# Patient Record
Sex: Male | Born: 1975 | Race: Black or African American | Hispanic: No | Marital: Married | Smoking: Current every day smoker
Health system: Southern US, Community
[De-identification: ages and names within clinical notes are randomized; demographics above are authoritative.]

## PROBLEM LIST (undated history)

## (undated) DIAGNOSIS — I509 Heart failure, unspecified: Secondary | ICD-10-CM

## (undated) DIAGNOSIS — F319 Bipolar disorder, unspecified: Secondary | ICD-10-CM

## (undated) DIAGNOSIS — I10 Essential (primary) hypertension: Secondary | ICD-10-CM

## (undated) HISTORY — PX: FOOT SURGERY: SHX648

## (undated) HISTORY — PX: BICEPS TENDON REPAIR: SHX566

## (undated) HISTORY — DX: Bipolar disorder, unspecified: F31.9

## (undated) HISTORY — PX: ROTATOR CUFF REPAIR: SHX139

---

## 1999-06-14 ENCOUNTER — Encounter: Payer: Self-pay | Admitting: Emergency Medicine

## 1999-06-14 ENCOUNTER — Emergency Department (HOSPITAL_COMMUNITY): Admission: EM | Admit: 1999-06-14 | Discharge: 1999-06-14 | Payer: Self-pay | Admitting: Emergency Medicine

## 1999-06-20 ENCOUNTER — Emergency Department (HOSPITAL_COMMUNITY): Admission: EM | Admit: 1999-06-20 | Discharge: 1999-06-20 | Payer: Self-pay | Admitting: Emergency Medicine

## 2018-10-09 ENCOUNTER — Inpatient Hospital Stay
Admission: EM | Admit: 2018-10-09 | Discharge: 2018-10-11 | DRG: 280 | Disposition: A | Discharge status: Home / Self Care | Payer: Self-pay | Attending: Internal Medicine | Admitting: Internal Medicine

## 2018-10-09 ENCOUNTER — Other Ambulatory Visit: Payer: Self-pay

## 2018-10-09 ENCOUNTER — Emergency Department: Payer: Self-pay

## 2018-10-09 ENCOUNTER — Inpatient Hospital Stay: Payer: Self-pay

## 2018-10-09 DIAGNOSIS — T50916A Underdosing of multiple unspecified drugs, medicaments and biological substances, initial encounter: Secondary | ICD-10-CM | POA: Diagnosis present

## 2018-10-09 DIAGNOSIS — Z716 Tobacco abuse counseling: Secondary | ICD-10-CM

## 2018-10-09 DIAGNOSIS — F17200 Nicotine dependence, unspecified, uncomplicated: Secondary | ICD-10-CM | POA: Diagnosis present

## 2018-10-09 DIAGNOSIS — R059 Cough, unspecified: Secondary | ICD-10-CM

## 2018-10-09 DIAGNOSIS — I110 Hypertensive heart disease with heart failure: Secondary | ICD-10-CM | POA: Diagnosis present

## 2018-10-09 DIAGNOSIS — Z79899 Other long term (current) drug therapy: Secondary | ICD-10-CM

## 2018-10-09 DIAGNOSIS — I5023 Acute on chronic systolic (congestive) heart failure: Secondary | ICD-10-CM | POA: Diagnosis present

## 2018-10-09 DIAGNOSIS — G4733 Obstructive sleep apnea (adult) (pediatric): Secondary | ICD-10-CM | POA: Diagnosis present

## 2018-10-09 DIAGNOSIS — Z818 Family history of other mental and behavioral disorders: Secondary | ICD-10-CM

## 2018-10-09 DIAGNOSIS — I214 Non-ST elevation (NSTEMI) myocardial infarction: Principal | ICD-10-CM | POA: Diagnosis present

## 2018-10-09 DIAGNOSIS — R079 Chest pain, unspecified: Secondary | ICD-10-CM | POA: Diagnosis present

## 2018-10-09 DIAGNOSIS — Z888 Allergy status to other drugs, medicaments and biological substances status: Secondary | ICD-10-CM

## 2018-10-09 DIAGNOSIS — Z880 Allergy status to penicillin: Secondary | ICD-10-CM

## 2018-10-09 DIAGNOSIS — I472 Ventricular tachycardia: Secondary | ICD-10-CM | POA: Diagnosis present

## 2018-10-09 DIAGNOSIS — R011 Cardiac murmur, unspecified: Secondary | ICD-10-CM | POA: Diagnosis present

## 2018-10-09 DIAGNOSIS — R05 Cough: Secondary | ICD-10-CM

## 2018-10-09 DIAGNOSIS — Z7982 Long term (current) use of aspirin: Secondary | ICD-10-CM

## 2018-10-09 DIAGNOSIS — Z881 Allergy status to other antibiotic agents status: Secondary | ICD-10-CM

## 2018-10-09 DIAGNOSIS — I428 Other cardiomyopathies: Secondary | ICD-10-CM | POA: Diagnosis present

## 2018-10-09 DIAGNOSIS — Z823 Family history of stroke: Secondary | ICD-10-CM

## 2018-10-09 DIAGNOSIS — Z6841 Body Mass Index (BMI) 40.0 and over, adult: Secondary | ICD-10-CM

## 2018-10-09 DIAGNOSIS — Z23 Encounter for immunization: Secondary | ICD-10-CM

## 2018-10-09 DIAGNOSIS — Z91120 Patient's intentional underdosing of medication regimen due to financial hardship: Secondary | ICD-10-CM

## 2018-10-09 HISTORY — DX: Essential (primary) hypertension: I10

## 2018-10-09 HISTORY — DX: Heart failure, unspecified: I50.9

## 2018-10-09 LAB — TROPONIN I
Troponin I: 0.04 ng/mL (ref <0.03)
Troponin I: 0.05 ng/mL (ref <0.03)
Troponin I: 0.21 ng/mL (ref <0.03)
Troponin I: 0.41 ng/mL (ref <0.03)
Troponin I: 0.41 ng/mL (ref <0.03)

## 2018-10-09 LAB — HEMOGLOBIN A1C
Hgb A1c MFr Bld: 5.3 % (ref 4.8–5.6)
Mean Plasma Glucose: 105.41 mg/dL

## 2018-10-09 LAB — BASIC METABOLIC PANEL
Anion gap: 4 — ABNORMAL LOW (ref 5–15)
BUN: 9 mg/dL (ref 6–20)
CHLORIDE: 112 mmol/L — AB (ref 98–111)
CO2: 24 mmol/L (ref 22–32)
Calcium: 8.5 mg/dL — ABNORMAL LOW (ref 8.9–10.3)
Creatinine, Ser: 1.02 mg/dL (ref 0.61–1.24)
GFR calc Af Amer: >60 mL/min (ref >60)
GFR calc non Af Amer: >60 mL/min (ref >60)
Glucose, Bld: 112 mg/dL — ABNORMAL HIGH (ref 70–99)
Potassium: 3.7 mmol/L (ref 3.5–5.1)
SODIUM: 140 mmol/L (ref 135–145)

## 2018-10-09 LAB — CBC
HCT: 38.2 % — ABNORMAL LOW (ref 39.0–52.0)
Hemoglobin: 11.2 g/dL — ABNORMAL LOW (ref 13.0–17.0)
MCH: 22.2 pg — ABNORMAL LOW (ref 26.0–34.0)
MCHC: 29.3 g/dL — ABNORMAL LOW (ref 30.0–36.0)
MCV: 75.6 fL — ABNORMAL LOW (ref 80.0–100.0)
Platelets: 306 10*3/uL (ref 150–400)
RBC: 5.05 MIL/uL (ref 4.22–5.81)
RDW: 17.6 % — ABNORMAL HIGH (ref 11.5–15.5)
WBC: 7.1 10*3/uL (ref 4.0–10.5)
nRBC: 0 % (ref 0.0–0.2)

## 2018-10-09 LAB — PROTIME-INR
INR: 1.11
Prothrombin Time: 14.2 seconds (ref 11.4–15.2)

## 2018-10-09 LAB — BRAIN NATRIURETIC PEPTIDE: B Natriuretic Peptide: 425 pg/mL — ABNORMAL HIGH (ref 0.0–100.0)

## 2018-10-09 LAB — TSH: TSH: 1.976 u[IU]/mL (ref 0.350–4.500)

## 2018-10-09 LAB — APTT: aPTT: 30 seconds (ref 24–36)

## 2018-10-09 LAB — MAGNESIUM: Magnesium: 2.2 mg/dL (ref 1.7–2.4)

## 2018-10-09 LAB — HEPARIN LEVEL (UNFRACTIONATED): Heparin Unfractionated: <0.1 IU/mL — ABNORMAL LOW (ref 0.30–0.70)

## 2018-10-09 MED ORDER — POTASSIUM CHLORIDE CRYS ER 20 MEQ PO TBCR
40.0000 meq | EXTENDED_RELEASE_TABLET | ORAL | Status: AC
  Administered 2018-10-09: 40 meq via ORAL
  Filled 2018-10-09: qty 2

## 2018-10-09 MED ORDER — FUROSEMIDE 40 MG PO TABS: 40.0000 mg | ORAL_TABLET | Freq: Two times a day (BID) | ORAL | Status: DC

## 2018-10-09 MED ORDER — MORPHINE SULFATE (PF) 2 MG/ML IV SOLN
2.0000 mg | Freq: Once | INTRAVENOUS | Status: AC
  Administered 2018-10-09: 2 mg via INTRAVENOUS
  Filled 2018-10-09: qty 1

## 2018-10-09 MED ORDER — FUROSEMIDE 10 MG/ML IJ SOLN
20.0000 mg | Freq: Two times a day (BID) | INTRAMUSCULAR | Status: DC
  Administered 2018-10-09 – 2018-10-10 (×2): 20 mg via INTRAVENOUS
  Filled 2018-10-09 (×3): qty 2

## 2018-10-09 MED ORDER — SODIUM CHLORIDE 0.9% FLUSH: 3.0000 mL | INTRAVENOUS | Status: DC | PRN

## 2018-10-09 MED ORDER — MORPHINE SULFATE (PF) 2 MG/ML IV SOLN
2.0000 mg | INTRAVENOUS | Status: DC | PRN
  Administered 2018-10-09 – 2018-10-11 (×5): 2 mg via INTRAVENOUS
  Filled 2018-10-09 (×5): qty 1

## 2018-10-09 MED ORDER — ACETAMINOPHEN 325 MG PO TABS
650.0000 mg | ORAL_TABLET | Freq: Four times a day (QID) | ORAL | Status: DC | PRN
  Administered 2018-10-09: 650 mg via ORAL
  Filled 2018-10-09 (×3): qty 2

## 2018-10-09 MED ORDER — SODIUM CHLORIDE 0.9 % IV BOLUS
250.0000 mL | Freq: Once | INTRAVENOUS | Status: AC
  Administered 2018-10-09: 250 mL via INTRAVENOUS

## 2018-10-09 MED ORDER — ATORVASTATIN CALCIUM 20 MG PO TABS
40.0000 mg | ORAL_TABLET | Freq: Every day | ORAL | Status: DC
  Administered 2018-10-09 – 2018-10-10 (×2): 40 mg via ORAL
  Filled 2018-10-09 (×3): qty 2

## 2018-10-09 MED ORDER — MORPHINE SULFATE (PF) 4 MG/ML IV SOLN
4.0000 mg | Freq: Once | INTRAVENOUS | Status: AC
  Administered 2018-10-09: 4 mg via INTRAVENOUS
  Filled 2018-10-09: qty 1

## 2018-10-09 MED ORDER — SODIUM CHLORIDE 0.9% FLUSH
10.0000 mL | Freq: Two times a day (BID) | INTRAVENOUS | Status: DC
  Administered 2018-10-09 – 2018-10-11 (×4): 10 mL

## 2018-10-09 MED ORDER — FUROSEMIDE 10 MG/ML IJ SOLN
40.0000 mg | Freq: Once | INTRAMUSCULAR | Status: AC
  Administered 2018-10-09: 40 mg via INTRAVENOUS
  Filled 2018-10-09: qty 4

## 2018-10-09 MED ORDER — HEPARIN (PORCINE) 25000 UT/250ML-% IV SOLN
1900.0000 [IU]/h | INTRAVENOUS | Status: DC
  Administered 2018-10-09: 1600 [IU]/h via INTRAVENOUS
  Administered 2018-10-09: 1200 [IU]/h via INTRAVENOUS
  Administered 2018-10-10: 1600 [IU]/h via INTRAVENOUS
  Administered 2018-10-10 – 2018-10-11 (×2): 1900 [IU]/h via INTRAVENOUS
  Filled 2018-10-09 (×5): qty 250

## 2018-10-09 MED ORDER — DOCUSATE SODIUM 100 MG PO CAPS
100.0000 mg | ORAL_CAPSULE | Freq: Two times a day (BID) | ORAL | Status: DC
  Administered 2018-10-09 – 2018-10-11 (×4): 100 mg via ORAL
  Filled 2018-10-09 (×5): qty 1

## 2018-10-09 MED ORDER — NICOTINE 21 MG/24HR TD PT24
21.0000 mg | MEDICATED_PATCH | Freq: Every day | TRANSDERMAL | Status: DC
  Administered 2018-10-09 – 2018-10-11 (×3): 21 mg via TRANSDERMAL
  Filled 2018-10-09 (×3): qty 1

## 2018-10-09 MED ORDER — ALBUTEROL SULFATE (2.5 MG/3ML) 0.083% IN NEBU
2.5000 mg | INHALATION_SOLUTION | RESPIRATORY_TRACT | Status: DC | PRN
  Administered 2018-10-09: 2.5 mg via RESPIRATORY_TRACT
  Filled 2018-10-09: qty 3

## 2018-10-09 MED ORDER — INFLUENZA VAC SPLIT QUAD 0.5 ML IM SUSY
0.5000 mL | PREFILLED_SYRINGE | INTRAMUSCULAR | Status: AC
  Administered 2018-10-10: 0.5 mL via INTRAMUSCULAR
  Filled 2018-10-09: qty 0.5

## 2018-10-09 MED ORDER — LISINOPRIL 20 MG PO TABS
40.0000 mg | ORAL_TABLET | Freq: Every day | ORAL | Status: DC
  Administered 2018-10-09: 40 mg via ORAL
  Filled 2018-10-09: qty 2
  Filled 2018-10-09: qty 4

## 2018-10-09 MED ORDER — SODIUM CHLORIDE 0.9% FLUSH
3.0000 mL | Freq: Two times a day (BID) | INTRAVENOUS | Status: DC
  Administered 2018-10-09 – 2018-10-11 (×4): 3 mL via INTRAVENOUS

## 2018-10-09 MED ORDER — ASPIRIN 81 MG PO TABS: 81.0000 mg | ORAL_TABLET | Freq: Every day | ORAL | Status: DC

## 2018-10-09 MED ORDER — CALCIUM GLUCONATE-NACL 1-0.675 GM/50ML-% IV SOLN
1.0000 g | Freq: Once | INTRAVENOUS | Status: AC
  Administered 2018-10-09: 1000 mg via INTRAVENOUS
  Filled 2018-10-09: qty 50

## 2018-10-09 MED ORDER — ACETAMINOPHEN 325 MG RE SUPP
650.0000 mg | Freq: Four times a day (QID) | RECTAL | Status: DC | PRN
  Filled 2018-10-09: qty 1

## 2018-10-09 MED ORDER — HEPARIN BOLUS VIA INFUSION
3250.0000 [IU] | Freq: Once | INTRAVENOUS | Status: AC
  Administered 2018-10-09: 3250 [IU] via INTRAVENOUS
  Filled 2018-10-09: qty 3250

## 2018-10-09 MED ORDER — CYCLOBENZAPRINE HCL 10 MG PO TABS: 10.0000 mg | ORAL_TABLET | Freq: Three times a day (TID) | ORAL | Status: DC | PRN

## 2018-10-09 MED ORDER — SODIUM CHLORIDE 0.9 % IV SOLN: INTRAVENOUS | Status: DC

## 2018-10-09 MED ORDER — AMIODARONE HCL IN DEXTROSE 360-4.14 MG/200ML-% IV SOLN
60.0000 mg/h | INTRAVENOUS | Status: DC
  Filled 2018-10-09: qty 200

## 2018-10-09 MED ORDER — CARVEDILOL 25 MG PO TABS
25.0000 mg | ORAL_TABLET | Freq: Two times a day (BID) | ORAL | Status: DC
  Administered 2018-10-09 – 2018-10-10 (×2): 25 mg via ORAL
  Filled 2018-10-09 (×5): qty 1

## 2018-10-09 MED ORDER — ASPIRIN EC 81 MG PO TBEC
81.0000 mg | DELAYED_RELEASE_TABLET | Freq: Every day | ORAL | Status: DC
  Administered 2018-10-09 – 2018-10-11 (×3): 81 mg via ORAL
  Filled 2018-10-09 (×3): qty 1

## 2018-10-09 MED ORDER — ONDANSETRON HCL 4 MG/2ML IJ SOLN
4.0000 mg | Freq: Four times a day (QID) | INTRAMUSCULAR | Status: DC | PRN
  Administered 2018-10-09: 4 mg via INTRAVENOUS
  Filled 2018-10-09: qty 2

## 2018-10-09 MED ORDER — ONDANSETRON HCL 4 MG PO TABS
4.0000 mg | ORAL_TABLET | Freq: Four times a day (QID) | ORAL | Status: DC | PRN
  Filled 2018-10-09: qty 1

## 2018-10-09 MED ORDER — SODIUM CHLORIDE 0.9% FLUSH
10.0000 mL | INTRAVENOUS | Status: DC | PRN
  Administered 2018-10-09: 10 mL
  Filled 2018-10-09 (×2): qty 40

## 2018-10-09 MED ORDER — NITROGLYCERIN 0.4 MG SL SUBL
0.4000 mg | SUBLINGUAL_TABLET | SUBLINGUAL | Status: DC | PRN
  Administered 2018-10-10 – 2018-10-11 (×4): 0.4 mg via SUBLINGUAL
  Filled 2018-10-09 (×2): qty 1

## 2018-10-09 MED ORDER — AMIODARONE LOAD VIA INFUSION
150.0000 mg | Freq: Once | INTRAVENOUS | Status: DC
  Filled 2018-10-09: qty 83.34

## 2018-10-09 MED ORDER — HEPARIN BOLUS VIA INFUSION
4000.0000 [IU] | Freq: Once | INTRAVENOUS | Status: AC
  Administered 2018-10-09: 4000 [IU] via INTRAVENOUS
  Filled 2018-10-09: qty 4000

## 2018-10-09 MED ORDER — AMIODARONE HCL IN DEXTROSE 360-4.14 MG/200ML-% IV SOLN: 30.0000 mg/h | INTRAVENOUS | Status: DC

## 2018-10-09 MED ORDER — ENOXAPARIN SODIUM 40 MG/0.4ML ~~LOC~~ SOLN: 40.0000 mg | SUBCUTANEOUS | Status: DC

## 2018-10-09 NOTE — Progress Notes (Signed)
   10/09/18 2100  Clinical Encounter Type  Visited With Patient  Visit Type Follow-up;Spiritual support  Referral From Nurse  Spiritual Encounters  Spiritual Needs Prayer;Emotional  Stress Factors  Patient Stress Factors Health changes;Major life changes   Chaplain followed up with the patient and talked with him regarding some of the current stressors in his life (family relationships, vocation/ministry, health). The patient was laying in bed upon my return. He indicated that the medication was stopped due to his low BP. Patient requested prayer and the laying on of hands for his health challenges and life changes. Chaplain obliged and the patient expressed gratitude. Will follow up.

## 2018-10-09 NOTE — Progress Notes (Signed)
   10/09/18 1900  Clinical Encounter Type  Visited With Patient  Visit Type Initial  Referral From Nurse  Stress Factors  Patient Stress Factors Health changes   OR received for prayer. Upon arrival, the patient was sitting up in the recliner watching TV with his left foot soaking in a basin of water. He shared what brought him to the hospital and his challenges with CHF. He notes that he "should have come to the hospital one day earlier". During the conversation, his nurse arrived to update him on a period of prolonged irregular heart rhythm and the need to administer a medication and vitamin K to address this. Chaplain will return later to allow time for the care of these needs.

## 2018-10-09 NOTE — ED Notes (Signed)
Dr. Owens Shark made aware of pts trop

## 2018-10-09 NOTE — Consult Note (Signed)
Reason for Consult: Congestive heart failure shortness of breath borderline troponins Referring Physician: Rosilyn Mings hospitalist Cardiologist [facility]  Marc [name] is an 43 y.o. male.  HPI: Patient with a 84 year old obese black male congestive heart failure nonischemic cardiomyopathy hypertension obstructive sleep apnea shortness of breath edema smoking patient has been noncompliant because he lost his Medicaid still has not been able to follow-up at [facility] patient has been seen in almost 4 years.  Last seen in 2016 his heart function was about 30% he presented this time with shortness of breath dyspnea leg edema chest x-ray suggested possible lower lobe infiltrate on the left but BNP was elevated he was significantly short of breath with leg edema still patient was treated with diuretics for heart failure he also complained of vague left-sided chest pain with a borderline troponin now here for further cardiac assessment  Past Medical History:  Diagnosis Date  . CHF (congestive heart failure) ([place])   . Hypertension     Past Surgical History:  Procedure Laterality Date  . BICEPS TENDON REPAIR    . FOOT SURGERY     metataral and fasciotomy  . ROTATOR CUFF REPAIR      Family History  Problem Relation Age of Onset  . Stroke Mother   . Dementia Mother     Social History:  reports that he has been smoking. He has never used smokeless tobacco. He reports previous alcohol use. He reports previous drug use.  Allergies:  Allergies  Allergen Reactions  . Tramadol Other (See Comments)    Pt stated that it gave him sores.  . Amoxicillin Rash  . Zithromax [Azithromycin] Rash    Medications: I have reviewed the patient's current medications.  Results for orders placed or performed during the hospital encounter of 10/09/18 (from the past 48 hour(s))  Basic metabolic panel     Status: Abnormal   Collection Time: 10/09/18  2:45 AM  Result Value Ref Range   Sodium 140 135 -  145 mmol/L   Potassium 3.7 3.5 - 5.1 mmol/L   Chloride 112 (H) 98 - 111 mmol/L   CO2 24 22 - 32 mmol/L   Glucose, Bld 112 (H) 70 - 99 mg/dL   BUN 9 6 - 20 mg/dL   Creatinine, Ser 1.02 0.61 - 1.24 mg/dL   Calcium 8.5 (L) 8.9 - 10.3 mg/dL   GFR calc non Af Amer >60 >60 mL/min   GFR calc Af Amer >60 >60 mL/min   Anion gap 4 (L) 5 - 15    Comment: Performed at [facility], [address] [place], [place] [address]  CBC     Status: Abnormal   Collection Time: 10/09/18  2:45 AM  Result Value Ref Range   WBC 7.1 4.0 - 10.5 K/uL   RBC 5.05 4.22 - 5.81 MIL/uL   Hemoglobin 11.2 (L) 13.0 - 17.0 g/dL   HCT 38.2 (L) 39.0 - 52.0 %   MCV 75.6 (L) 80.0 - 100.0 fL   MCH 22.2 (L) 26.0 - 34.0 pg   MCHC 29.3 (L) 30.0 - 36.0 g/dL   RDW 17.6 (H) 11.5 - 15.5 %   Platelets 306 150 - 400 K/uL   nRBC 0.0 0.0 - 0.2 %    Comment: Performed at [facility], [address], [place], [place] [address]  Troponin I - Once     Status: Abnormal   Collection Time: 10/09/18  2:45 AM  Result Value Ref Range   Troponin  I 0.04 (HH) <0.03 ng/mL    Comment: CRITICAL RESULT CALLED TO, READ BACK BY AND VERIFIED WITH BRITTNEY SAMPSON AT 0330 10/09/2018.  TFK Performed at [facility], [address] [place], [place] [address]   Brain natriuretic peptide     Status: Abnormal   Collection Time: 10/09/18  2:45 AM  Result Value Ref Range   B Natriuretic Peptide 425.0 (H) 0.0 - 100.0 pg/mL    Comment: Performed at [facility], [address] [place], [place] [address]  Troponin I - ONCE - STAT     Status: Abnormal   Collection Time: 10/09/18  5:32 AM  Result Value Ref Range   Troponin I 0.05 (HH) <0.03 ng/mL    Comment: CRITICAL VALUE NOTED. VALUE IS CONSISTENT WITH PREVIOUSLY REPORTED/CALLED VALUE/HKP Performed at [facility], [address] [place], [place] [address]   TSH     Status: None   Collection Time: 10/09/18 10:29 AM  Result Value Ref Range   TSH  1.976 0.350 - 4.500 uIU/mL    Comment: Performed by a 3rd Generation assay with a functional sensitivity of <=0.01 uIU/mL. Performed at [facility], [address] [place], [place] [address]   Troponin I - Now Then Q6H     Status: Abnormal   Collection Time: 10/09/18 10:29 AM  Result Value Ref Range   Troponin I 0.21 (HH) <0.03 ng/mL    Comment: CRITICAL VALUE NOTED. VALUE IS CONSISTENT WITH PREVIOUSLY REPORTED/CALLED VALUE...[facility] Performed at [facility], [address] [place],  [address]     Dg Chest Port 1 View  Result Date: 10/09/2018 CLINICAL DATA:  Chest pain EXAM: PORTABLE CHEST 1 VIEW COMPARISON:  None. FINDINGS: Cardiomegaly with globular cardiac configuration. Vascular congestion with moderate interstitial and hazy opacity. No pleural effusion or pneumothorax. IMPRESSION: 1. Cardiomegaly with globular cardiac configuration either due to multi chamber enlargement or pericardial effusion. 2. Vascular congestion with moderate interstitial pulmonary edema Electronically Signed   By: Donavan Foil M.D.   On: 10/09/2018 02:56    Review of Systems  Constitutional: Positive for malaise/fatigue.  HENT: Positive for congestion.   Eyes: Negative.   Respiratory: Positive for cough and shortness of breath.   Cardiovascular: Positive for chest pain, orthopnea, leg swelling and PND.  Gastrointestinal: Negative.   Genitourinary: Negative.   Musculoskeletal: Positive for joint pain and myalgias.  Skin: Negative.   Neurological: Negative.   Endo/Heme/Allergies: Negative.   Psychiatric/Behavioral: Negative.    Blood pressure 90/79, pulse 94, temperature 97.7 F (36.5 C), temperature source Oral, resp. rate (!) 27, height 5\' 8"  (1.727 m), weight (!) 148.3 kg, SpO2 92 %. Physical Exam  Nursing note and vitals reviewed. Constitutional: He is oriented to person, place, and time. He appears well-developed and well-nourished.  HENT:  Head: Normocephalic and  atraumatic.  Eyes: Pupils are equal, round, and reactive to light. Conjunctivae and EOM are normal.  Neck: Normal range of motion. Neck supple.  Cardiovascular: Normal rate and regular rhythm. Exam reveals gallop.  Murmur heard. Respiratory: Breath sounds normal. He is in respiratory distress.  GI: Soft. Bowel sounds are normal.  Musculoskeletal:        General: Edema present.  Neurological: He is alert and oriented to person, place, and time. He has normal reflexes.  Skin: Skin is warm and dry.  Psychiatric: He has a normal mood and affect.    Assessment/Plan: Congestive heart failure Chest pain Shortness of breath Obstructive sleep apnea Borderline troponin Edema  Smoking Elevated BNP Morbid obesity Noncompliant . Plan Recommend agree with supplemental oxygen therapy as necessary Diuretic therapy to help with edema heart failure ACE inhibitor beta-blocker consider spironolactone as well Patient may be a candidate for Entresto but affordability may be an issue Reassess obstructive sleep apnea possibly with a sleep study Advised the patient to refrain from tobacco abuse Troponins probably demand ischemia we will follow-up further studies Advised weight loss exercise portion control Refer the patient back to heart failure clinic Consider have the patient follow-up as an outpatient for heart failure therapy  Spyros Winch D Enrique Weiss 10/09/2018, 12:16 PM

## 2018-10-09 NOTE — Progress Notes (Signed)
ANTICOAGULATION CONSULT NOTE - Initial Consult  Pharmacy Consult for  Heparin  Indication: chest pain/ACS  Allergies  Allergen Reactions  . Tramadol Other (See Comments)    Pt stated that it gave him sores.  . Amoxicillin Rash  . Zithromax [Azithromycin] Rash    Patient Measurements: Height: 5\' 8"  (172.7 cm) Weight: (!) 357 lb 12.8 oz (162.3 kg) IBW/kg (Calculated) : 68.4 Heparin Dosing Weight: 108.5 kg   Vital Signs: Temp: 97.6 F (36.4 C) (02/23 1550) Temp Source: Oral (02/23 1550) BP: 74/53 (02/23 2030) Pulse Rate: 82 (02/23 2030)  Labs: Recent Labs    10/09/18 0245 10/09/18 0532 10/09/18 1029 10/09/18 1257 10/09/18 1608 10/09/18 1956  HGB 11.2*  --   --   --   --   --   HCT 38.2*  --   --   --   --   --   PLT 306  --   --   --   --   --   APTT  --   --   --  30  --   --   LABPROT  --   --   --  14.2  --   --   INR  --   --   --  1.11  --   --   HEPARINUNFRC  --   --   --   --   --  <0.10*  CREATININE 1.02  --   --   --   --   --   TROPONINI 0.04* 0.05* 0.21*  --  0.41*  --     Estimated Creatinine Clearance: 138.6 mL/min (by C-G formula based on SCr of 1.02 mg/dL).   Medical History: Past Medical History:  Diagnosis Date  . CHF (congestive heart failure) ([place])   . Hypertension     Medications:  Medications Prior to Admission  Medication Sig Dispense Refill Last Dose  . albuterol (PROVENTIL HFA;VENTOLIN HFA) 108 (90 Base) MCG/ACT inhaler Inhale 2 puffs into the lungs every 6 (six) hours as needed for wheezing.   prn at prn  . aspirin 81 MG tablet Take 81 mg by mouth daily.   10/08/2018 at 0700  . carvedilol (COREG) 25 MG tablet Take 25 mg by mouth 2 (two) times daily.   Past Month at Unknown time  . cyclobenzaprine (FLEXERIL) 10 MG tablet Take 10 mg by mouth 3 (three) times daily as needed for muscle spasms.   Past Month at Unknown time  . furosemide (LASIX) 40 MG tablet Take 40 mg by mouth 2 (two) times daily.   10/08/2018 at 0700  . lisinopril  (PRINIVIL,ZESTRIL) 40 MG tablet Take 40 mg by mouth daily.   Past Month at Unknown time    Assessment: Pharmacy consulted for heparin dosing and monitoring in 43 yo male admitted with ACS/NSTEMI  Goal of Therapy:  Heparin level 0.3-0.7 units/ml Monitor platelets by anticoagulation protocol: Yes   Plan:  Dosing Weight 104 Kg Baseline labs ordered  Give 4000 units bolus x 1 Start heparin infusion at 1200 units/hr Check anti-Xa level in 6 hours and daily while on heparin Continue to monitor H&H and platelets  2/23:  HL @ 2000 = < 0.1 Will order Heparin 3250 units IV X 1 bolus and increase drip rate to 1600 units/hr.  Will recheck HL 6 hrs after rate change.  Yecenia Dalgleish D 10/09/2018,9:18 PM

## 2018-10-09 NOTE — Progress Notes (Signed)
Advanced care plan. Purpose of the Encounter: CODE STATUS Parties in Attendance: Patient Patient's Decision Capacity:Good Subjective/Patient's story: Presented to the emergency room for chest pain and shortness of breath Objective/Medical story Patient has elevated troponin.  Needs IV heparin drip. Patient needs diuresis with Lasix for heart failure exacerbation Cardiology evaluation  Goals of care determination:  Advance care directives and goals of care and treatment plan discussed Patient wants everything done which includes CPR, intubation ventilator if the need arises CODE STATUS: Full code Time spent discussing advanced care planning: 16 minutes

## 2018-10-09 NOTE — Progress Notes (Signed)
[place] [facility] [place] NAME: Marc [name]    MR#:  [id number]  DATE OF BIRTH:  10/27/1975  SUBJECTIVE:  CHIEF COMPLAINT:   Chief Complaint  Patient presents with  . Chest Pain  . Shortness of Breath  Patient seen and evaluated today Complains of chest pain Has shortness of breath Has orthopnea  REVIEW OF SYSTEMS:    ROS  CONSTITUTIONAL: No documented fever. No fatigue, weakness. No weight gain, no weight loss.  EYES: No blurry or double vision.  ENT: No tinnitus. No postnasal drip. No redness of the oropharynx.  RESPIRATORY: No cough, no wheeze, no hemoptysis. No dyspnea.  CARDIOVASCULAR: Has chest pain. Has orthopnea. No palpitations. No syncope.  GASTROINTESTINAL: No nausea, no vomiting or diarrhea. No abdominal pain. No melena or hematochezia.  GENITOURINARY: No dysuria or hematuria.  ENDOCRINE: No polyuria or nocturia. No heat or cold intolerance.  HEMATOLOGY: No anemia. No bruising. No bleeding.  INTEGUMENTARY: No rashes. No lesions.  MUSCULOSKELETAL: No arthritis. Has swelling. No gout.  NEUROLOGIC: No numbness, tingling, or ataxia. No seizure-type activity.  PSYCHIATRIC: No anxiety. No insomnia. No ADD.   DRUG ALLERGIES:   Allergies  Allergen Reactions  . Tramadol Other (See Comments)    Pt stated that it gave him sores.  . Amoxicillin Rash  . Zithromax [Azithromycin] Rash    VITALS:  Blood pressure 90/79, pulse 94, temperature 97.7 F (36.5 C), temperature source Oral, resp. rate (!) 27, height 5\' 8"  (1.727 m), weight (!) 148.3 kg, SpO2 92 %.  PHYSICAL EXAMINATION:   Physical Exam  GENERAL:  43 y.o.-year-old patient lying in the bed with no acute distress.  EYES: Pupils equal, round, reactive to light and accommodation. No scleral icterus. Extraocular muscles intact.  HEENT: Head atraumatic, normocephalic. Oropharynx and nasopharynx clear.  NECK:  Supple, no jugular venous distention. No thyroid enlargement, no  tenderness.  LUNGS: Decreased breath sounds bilaterally, bibasilar crepitations heard. No use of accessory muscles of respiration.  CARDIOVASCULAR: S1, S2 normal. No murmurs, rubs, or gallops.  ABDOMEN: Soft, nontender, nondistended. Bowel sounds present. No organomegaly or mass.  EXTREMITIES: No cyanosis, clubbing  Has edema    NEUROLOGIC: Cranial nerves II through XII are intact. No focal Motor or sensory deficits b/l.   PSYCHIATRIC: The patient is alert and oriented x 3.  SKIN: No obvious rash, lesion, or ulcer.   LABORATORY PANEL:   CBC Recent Labs  Lab 10/09/18 0245  WBC 7.1  HGB 11.2*  HCT 38.2*  PLT 306   ------------------------------------------------------------------------------------------------------------------ Chemistries  Recent Labs  Lab 10/09/18 0245  NA 140  K 3.7  CL 112*  CO2 24  GLUCOSE 112*  BUN 9  CREATININE 1.02  CALCIUM 8.5*   ------------------------------------------------------------------------------------------------------------------  Cardiac Enzymes Recent Labs  Lab 10/09/18 1029  TROPONINI 0.21*   ------------------------------------------------------------------------------------------------------------------  RADIOLOGY:  Dg Chest Port 1 View  Result Date: 10/09/2018 CLINICAL DATA:  Chest pain EXAM: PORTABLE CHEST 1 VIEW COMPARISON:  None. FINDINGS: Cardiomegaly with globular cardiac configuration. Vascular congestion with moderate interstitial and hazy opacity. No pleural effusion or pneumothorax. IMPRESSION: 1. Cardiomegaly with globular cardiac configuration either due to multi chamber enlargement or pericardial effusion. 2. Vascular congestion with moderate interstitial pulmonary edema Electronically Signed   By: Donavan Foil M.D.   On: 10/09/2018 02:56     ASSESSMENT AND PLAN:  43 year old male patient with history of chronic congestive heart failure, hypertension currently under hospitalist service for chest pain and  shortness of  breath  -Non-STEMI Start patient on aspirin, beta-blocker and statin medication Cardiology consultation IV heparin drip for anticoagulation Check echocardiogram and serial troponins Telemetry monitoring  -Acute on chronic systolic heart failure exacerbation Continue Lasix for diuresis Cardiology evaluation Daily body weights and input output chart  -Hypertension Resume Coreg and lisinopril  -DVT prophylaxis On anticoagulation with heparin drip  -Tobacco abuse Tobacco cessation counseled to the patient for 6 minutes Nicotine patch offered   All the records are reviewed and case discussed with Care Management/Social Worker. Management plans discussed with the patient, family and they are in agreement.  CODE STATUS: Full code  DVT Prophylaxis: SCDs  TOTAL TIME TAKING CARE OF THIS PATIENT: 45 minutes.   POSSIBLE D/C IN 2 to 3 DAYS, DEPENDING ON CLINICAL CONDITION.  Saundra Shelling M.D on 10/09/2018 at 11:46 AM  Between 7am to 6pm - Pager - [phone]  After 6pm go to [email or web address] - password [email or web address] [place] [facility]  Office  [phone]  CC: Primary care physician; Patient, No Pcp Per  Note: This dictation was prepared with Dragon dictation along with smaller phrase technology. Any transcriptional errors that result from this process are unintentional.

## 2018-10-09 NOTE — ED Notes (Signed)
Pt cleared by MD to have ice chips. Pt given ice chips by this RN at this time

## 2018-10-09 NOTE — Consult Note (Signed)
ANTICOAGULATION CONSULT NOTE - Initial Consult  Pharmacy Consult for Heparin Drip  Indication: chest pain/ACS  Allergies  Allergen Reactions  . Tramadol Other (See Comments)    Pt stated that it gave him sores.  . Amoxicillin Rash  . Zithromax [Azithromycin] Rash    Patient Measurements: Height: 5\' 8"  (172.7 cm) Weight: (!) 327 lb (148.3 kg) IBW/kg (Calculated) : 68.4 Heparin Dosing Weight: 104 Kg  Vital Signs: Temp: 97.7 F (36.5 C) (02/23 0245) Temp Source: Oral (02/23 0245) BP: 90/79 (02/23 1100) Pulse Rate: 94 (02/23 1100)  Labs: Recent Labs    10/09/18 0245 10/09/18 0532 10/09/18 1029  HGB 11.2*  --   --   HCT 38.2*  --   --   PLT 306  --   --   CREATININE 1.02  --   --   TROPONINI 0.04* 0.05* 0.21*    Estimated Creatinine Clearance: 131.2 mL/min (by C-G formula based on SCr of 1.02 mg/dL).   Medical History: Past Medical History:  Diagnosis Date  . CHF (congestive heart failure) ([place])   . Hypertension     Assessment: Pharmacy consulted for heparin dosing and monitoring in 43 yo male admitted with ACS/NSTEMI  Goal of Therapy:  Heparin level 0.3-0.7 units/ml Monitor platelets by anticoagulation protocol: Yes   Plan:  Dosing Weight 104 Kg Baseline labs ordered  Give 4000 units bolus x 1 Start heparin infusion at 1200 units/hr Check anti-Xa level in 6 hours and daily while on heparin Continue to monitor H&H and platelets  Pernell Dupre, PharmD, BCPS Clinical Pharmacist 10/09/2018 12:37 PM

## 2018-10-09 NOTE — Progress Notes (Signed)
Notified MD of blood pressure. At this time, pt not symptomatic. Orders placed. Will continue to monitor and assess.

## 2018-10-09 NOTE — ED Provider Notes (Signed)
[facility] Emergency Department Provider Note    First MD Initiated Contact with Patient 10/09/18 0234     (approximate)  I have reviewed the triage vital signs and the nursing notes.   HISTORY  Chief Complaint Chest Pain and Shortness of Breath    HPI LARNIE HEART is a 43 y.o. male with history of hypertension and CHF presents to the emergency department with acute onset of left-sided chest pain with associated dyspnea and orthopnea which patient states began tonight at 12:30 AM.  Patient states that the pain in his left chest radiates to his back and left arm.  Patient was seen at [facility] 1 week ago's and was diagnosed with "a little fluid in his lungs for which he was given Lasix she has been compliant with.  Patient states that he has been battling CHF for years and is always been compliant with his Lasix.  Patient states that he has never had chest discomfort like this before.  Patient denies any lower extremity pain or swelling.  Past Medical History:  Diagnosis Date  . CHF (congestive heart failure) ([place])   . Hypertension     There are no active problems to display for this patient.     Prior to Admission medications   Not on File    Allergies Tramadol; Amoxicillin; and Zithromax [azithromycin]  No family history on file.  Social History Social History   Tobacco Use  . Smoking status: Current Every Day Smoker  . Smokeless tobacco: Never Used  Substance Use Topics  . Alcohol use: Not Currently    Frequency: Never  . Drug use: Not Currently    Review of Systems Constitutional: No fever/chills Eyes: No visual changes. ENT: No sore throat. Cardiovascular: Positive for chest pain. Respiratory: Positive for shortness of breath. Gastrointestinal: No abdominal pain.  No nausea, no vomiting.  No diarrhea.  No constipation. Genitourinary: Negative for dysuria. Musculoskeletal: Negative for neck pain.  Negative for back  pain. Integumentary: Negative for rash. Neurological: Negative for headaches, focal weakness or numbness.   ____________________________________________   PHYSICAL EXAM:  VITAL SIGNS: ED Triage Vitals  Enc Vitals Group     BP 10/09/18 0245 (!) 143/106     Pulse Rate 10/09/18 0245 100     Resp 10/09/18 0245 18     Temp 10/09/18 0245 97.7 F (36.5 C)     Temp Source 10/09/18 0245 Oral     SpO2 10/09/18 0245 95 %     Weight 10/09/18 0240 (!) 148.3 kg (327 lb)     Height 10/09/18 0240 1.727 m (5\' 8" )     Head Circumference --      Peak Flow --      Pain Score 10/09/18 0240 10     Pain Loc --      Pain Edu? --      Excl. in [place]? --     Constitutional: Alert and oriented.  Apparent discomfort  eyes: Conjunctivae are normal.  Mouth/Throat: Mucous membranes are moist.  Oropharynx non-erythematous. Neck: No stridor. Cardiovascular: Normal rate, regular rhythm. Good peripheral circulation. Grossly normal heart sounds. Respiratory: Normal respiratory effort.  No retractions.  Bibasilar rales on auscultation. Gastrointestinal: Soft and nontender. No distention. Musculoskeletal: No lower extremity tenderness nor edema. No gross deformities of extremities. Neurologic:  Normal speech and language. No gross focal neurologic deficits are appreciated.  Skin:  Skin is warm, dry and intact. No rash noted. Psychiatric: Mood and affect are normal.  Speech and behavior are normal.  ____________________________________________   LABS (all labs ordered are listed, but only abnormal results are displayed)  Labs Reviewed  CBC - Abnormal; Notable for the following components:      Result Value   Hemoglobin 11.2 (*)    HCT 38.2 (*)    MCV 75.6 (*)    MCH 22.2 (*)    MCHC 29.3 (*)    RDW 17.6 (*)    All other components within normal limits  BASIC METABOLIC PANEL  TROPONIN I  BRAIN NATRIURETIC PEPTIDE   ____________________________________________  EKG ED ECG REPORT I, [place]  Terisha Losasso, the attending physician, personally viewed and interpreted this ECG.   Date: 10/12/2018  EKG Time: 2:42 AM  Rate: 84  Rhythm: Normal sinus rhythm  Axis: Normal  Intervals: Normal  ST&T Change: None   ________________________  RADIOLOGY I, [place] N Osborn Pullin, personally viewed and evaluated these images (plain radiographs) as part of my medical decision making, as well as reviewing the written report by the radiologist.  ED MD interpretation: Cardiomegaly with interstitial edema vascular congestion per radiologist.  Official radiology report(s): Dg Chest Port 1 View  Result Date: 10/09/2018 CLINICAL DATA:  Chest pain EXAM: PORTABLE CHEST 1 VIEW COMPARISON:  None. FINDINGS: Cardiomegaly with globular cardiac configuration. Vascular congestion with moderate interstitial and hazy opacity. No pleural effusion or pneumothorax. IMPRESSION: 1. Cardiomegaly with globular cardiac configuration either due to multi chamber enlargement or pericardial effusion. 2. Vascular congestion with moderate interstitial pulmonary edema Electronically Signed   By: Donavan Foil M.D.   On: 10/09/2018 02:56    Procedures   ____________________________________________   INITIAL IMPRESSION / MDM / [place] / ED COURSE  As part of my medical decision making, I reviewed the following data within the electronic MEDICAL RECORD NUMBER  43 year old male presented with above-stated history and physical exam concerning for ACS/CHF exacerbation.  Chest x-ray finding consistent with CHF exacerbation as it reveals cardiomegaly with vascular congestion and moderate interstitial pulmonary edema.  Patient given Lasix IV 40 mg with excellent urinary output.  Given ongoing chest pain despite morphine administration will admit the patient to Dr. Marcille Blanco further evaluation and management.  Patient given aspirin 324 mg in the emergency department.    ____________________________________________  FINAL CLINICAL  IMPRESSION(S) / ED DIAGNOSES  Final diagnoses:  Chest pain, unspecified type  CHF exacerbation   MEDICATIONS GIVEN DURING THIS VISIT:  Medications  furosemide (LASIX) injection 40 mg (has no administration in time range)     ED Discharge Orders    None       Note:  This document was prepared using Dragon voice recognition software and may include unintentional dictation errors.   Gregor Hams, MD 10/12/18 0300

## 2018-10-09 NOTE — ED Triage Notes (Signed)
Pt stated that he started having chest pain around 1230 tonight and his left side starting hurting that radiated into his back and left arm. Pt stated that he feels SOB for the past week and he has CHF and is taken lasix. Pt stated that for the past month he has been out of his BP medication because he lost his medicaid.

## 2018-10-09 NOTE — Progress Notes (Signed)
Patient had a 60+ beat run of V-Tach. BP was 111/72.  Heart rate 82 and resumed SR. Potassium 3.6. No magnesium level available.  Drs. Pyreddy and Kenneth notified.   Potassium 40 meq po given.  Magnesium level ordered.  Amiodarone drip with bolus ordered.  BP before drip began was 79/49.  Dr. Clayborn Bigness instructed to hold the Amiodarone until the BP was greater than 100.

## 2018-10-09 NOTE — H&P (Signed)
Marc [name] is an 43 y.o. male.   Chief Complaint: Chest pain HPI: The patient with past medical history of CHF and hypertension presents to the emergency department complaining of shortness of breath with associated chest pain.  It actually began 24 hours ago and the patient took 324 mg of baby aspirin.  He has felt weak and mildly nauseous all day which prompted him to go to bed early this evening.  His pain awoke him from sleep.  The pain radiates to his back and left arm.  The patient admits that he has not had his medication for approximately 1 month as he has lost his Medicaid for now.  Laboratory evaluation revealed mild elevation in troponin as well as increased BNP.  He was given a dose of Lasix in the emergency department prior to the hospital service being called for admission.  Past Medical History:  Diagnosis Date  . CHF (congestive heart failure) ([place])   . Hypertension     Past Surgical History:  Procedure Laterality Date  . BICEPS TENDON REPAIR    . FOOT SURGERY     metataral and fasciotomy  . ROTATOR CUFF REPAIR      Family History  Problem Relation Age of Onset  . Stroke Mother   . Dementia Mother    Social History:  reports that he has been smoking. He has never used smokeless tobacco. He reports previous alcohol use. He reports previous drug use.  Allergies:  Allergies  Allergen Reactions  . Tramadol Other (See Comments)    Pt stated that it gave him sores.  . Amoxicillin Rash  . Zithromax [Azithromycin] Rash    Prior to Admission medications   Medication Sig Start Date End Date Taking? Authorizing Provider  albuterol (PROVENTIL HFA;VENTOLIN HFA) 108 (90 Base) MCG/ACT inhaler Inhale 2 puffs into the lungs every 6 (six) hours as needed for wheezing. 09/14/18 10/14/18 Yes [provider]  aspirin 81 MG tablet Take 81 mg by mouth daily. 07/31/10  Yes [provider]  carvedilol (COREG) 25 MG tablet Take 25 mg by mouth 2 (two) times daily.  08/18/18 08/18/19 Yes [provider]  cyclobenzaprine (FLEXERIL) 10 MG tablet Take 10 mg by mouth 3 (three) times daily as needed for muscle spasms. 01/11/18  Yes [provider]  furosemide (LASIX) 40 MG tablet Take 40 mg by mouth 2 (two) times daily. 08/18/18 10/14/18 Yes [provider]  lisinopril (PRINIVIL,ZESTRIL) 40 MG tablet Take 40 mg by mouth daily. 08/18/18 08/18/19 Yes [provider]     Results for orders placed or performed during the hospital encounter of 10/09/18 (from the past 48 hour(s))  Basic metabolic panel     Status: Abnormal   Collection Time: 10/09/18  2:45 AM  Result Value Ref Range   Sodium 140 135 - 145 mmol/L   Potassium 3.7 3.5 - 5.1 mmol/L   Chloride 112 (H) 98 - 111 mmol/L   CO2 24 22 - 32 mmol/L   Glucose, Bld 112 (H) 70 - 99 mg/dL   BUN 9 6 - 20 mg/dL   Creatinine, Ser 1.02 0.61 - 1.24 mg/dL   Calcium 8.5 (L) 8.9 - 10.3 mg/dL   GFR calc non Af Amer >60 >60 mL/min   GFR calc Af Amer >60 >60 mL/min   Anion gap 4 (L) 5 - 15    Comment: Performed at [facility], [address], [place], [place] [address]  CBC     Status: Abnormal  Collection Time: 10/09/18  2:45 AM  Result Value Ref Range   WBC 7.1 4.0 - 10.5 K/uL   RBC 5.05 4.22 - 5.81 MIL/uL   Hemoglobin 11.2 (L) 13.0 - 17.0 g/dL   HCT 38.2 (L) 39.0 - 52.0 %   MCV 75.6 (L) 80.0 - 100.0 fL   MCH 22.2 (L) 26.0 - 34.0 pg   MCHC 29.3 (L) 30.0 - 36.0 g/dL   RDW 17.6 (H) 11.5 - 15.5 %   Platelets 306 150 - 400 K/uL   nRBC 0.0 0.0 - 0.2 %    Comment: Performed at [facility], [address] [place], [place] [address]  Troponin I - Once     Status: Abnormal   Collection Time: 10/09/18  2:45 AM  Result Value Ref Range   Troponin I 0.04 (HH) <0.03 ng/mL    Comment: CRITICAL RESULT CALLED TO, READ BACK BY AND VERIFIED WITH BRITTNEY SAMPSON AT 0330 10/09/2018.  TFK Performed at [facility], [address] [place], [place] [address]   Brain  natriuretic peptide     Status: Abnormal   Collection Time: 10/09/18  2:45 AM  Result Value Ref Range   B Natriuretic Peptide 425.0 (H) 0.0 - 100.0 pg/mL    Comment: Performed at [facility], [address] [place], [place] [address]  Troponin I - ONCE - STAT     Status: Abnormal   Collection Time: 10/09/18  5:32 AM  Result Value Ref Range   Troponin I 0.05 (HH) <0.03 ng/mL    Comment: CRITICAL VALUE NOTED. VALUE IS CONSISTENT WITH PREVIOUSLY REPORTED/CALLED VALUE/HKP Performed at [facility], [address] [place],  [address]    Dg Chest [place] 1 View  Result Date: 10/09/2018 CLINICAL DATA:  Chest pain EXAM: PORTABLE CHEST 1 VIEW COMPARISON:  None. FINDINGS: Cardiomegaly with globular cardiac configuration. Vascular congestion with moderate interstitial and hazy opacity. No pleural effusion or pneumothorax. IMPRESSION: 1. Cardiomegaly with globular cardiac configuration either due to multi chamber enlargement or pericardial effusion. 2. Vascular congestion with moderate interstitial pulmonary edema Electronically Signed   By: Donavan Foil M.D.   On: 10/09/2018 02:56    Review of Systems  Constitutional: Negative for chills and fever.  HENT: Negative for sore throat and tinnitus.   Eyes: Negative for blurred vision and redness.  Respiratory: Positive for shortness of breath. Negative for cough.   Cardiovascular: Positive for chest pain and orthopnea. Negative for palpitations and PND.  Gastrointestinal: Negative for abdominal pain, diarrhea, nausea and vomiting.  Genitourinary: Negative for dysuria, frequency and urgency.  Musculoskeletal: Negative for joint pain and myalgias.  Skin: Negative for rash.       No lesions  Neurological: Negative for speech change, focal weakness and weakness.  Endo/Heme/Allergies: Does not bruise/bleed easily.       No temperature intolerance  Psychiatric/Behavioral: Negative for depression and suicidal ideas.    Blood  pressure (!) 145/99, pulse 90, temperature 97.7 F (36.5 C), temperature source Oral, resp. rate (!) 31, height 5\' 8"  (1.727 m), weight (!) 148.3 kg, SpO2 96 %. Physical Exam  Vitals reviewed. Constitutional: He is oriented to person, place, and time. He appears well-developed and well-nourished. No distress.  HENT:  Head: Normocephalic and atraumatic.  Mouth/Throat: Oropharynx is clear and moist.  Eyes: Pupils are equal, round, and reactive to light. Conjunctivae and EOM are normal. No scleral icterus.  Neck: Normal range of motion. Neck supple. No JVD present. No tracheal deviation present.  No thyromegaly present.  Cardiovascular: Normal rate, regular rhythm and normal heart sounds. Exam reveals no gallop and no friction rub.  No murmur heard. Respiratory: Effort normal and breath sounds normal. No respiratory distress.  GI: Soft. Bowel sounds are normal. He exhibits no distension. There is no abdominal tenderness.  Genitourinary:    Genitourinary Comments: Deferred   Musculoskeletal: Normal range of motion.        General: No edema.  Lymphadenopathy:    He has no cervical adenopathy.  Neurological: He is alert and oriented to person, place, and time. No cranial nerve deficit.  Skin: Skin is warm and dry. No rash noted. No erythema.  Psychiatric: He has a normal mood and affect. His behavior is normal. Judgment and thought content normal.     Assessment/Plan This is a 43 year old male admitted for chest pain. 1.  Chest pain: Elevated troponin.  Continue to follow cardiac biomarkers.  Monitor telemetry.  Consult cardiology. 2.  CHF: Acute on chronic; systolic.  Sparse notes but from cardiology visit in July 2016 echocardiogram report shows EF 30%.  Continue IV Lasix twice daily until euvolemic.  Obtain echo. 3.  Hypertension: Inadequately controlled; resume carvedilol and lisinopril 4.  Morbid obesity: BMI is 49; encouraged healthy diet and exercise 5.  DVT prophylaxis: Lovenox 6.   GI prophylaxis: None The patient is a full code.  Time spent on admission orders and patient care approximately 45 minutes  Harrie Foreman, MD 10/09/2018, 8:11 AM

## 2018-10-09 NOTE — Progress Notes (Addendum)
Patient admitted from the ER with SOB due to CHF exacerbation, and CP radiation to his back and left arm. His RR is 26, with sats on RA of 96%. His breathing is labored with dyspnea at rest.  His CP is 8 of 10.  He got 2 mg Morphine with some relief. His has cracked skin on the plantar side of his foot around the first joint of his toes. Poor hygiene of his feet, particularly between his toes.  His skin is thick and callused at these sites. The cracks do not appear to be new.  The skin between some toes appears to have yeast.  Wound consult requested.  1730.  Left foot soaked in soapy water, rinsed and dried.  Nystatin powder between his toes, and lotion on all calluses.  Unable to do right foot due to arrythmias.

## 2018-10-10 ENCOUNTER — Encounter: Payer: Self-pay | Admitting: Radiology

## 2018-10-10 ENCOUNTER — Inpatient Hospital Stay
Admit: 2018-10-10 | Discharge: 2018-10-10 | Disposition: A | Discharge status: Home / Self Care | Payer: Self-pay | Attending: Internal Medicine | Admitting: Internal Medicine

## 2018-10-10 ENCOUNTER — Inpatient Hospital Stay: Payer: Self-pay

## 2018-10-10 DIAGNOSIS — I5021 Acute systolic (congestive) heart failure: Secondary | ICD-10-CM

## 2018-10-10 LAB — CBC
HCT: 39 % (ref 39.0–52.0)
HEMOGLOBIN: 11.2 g/dL — AB (ref 13.0–17.0)
MCH: 22.3 pg — ABNORMAL LOW (ref 26.0–34.0)
MCHC: 28.7 g/dL — ABNORMAL LOW (ref 30.0–36.0)
MCV: 77.7 fL — ABNORMAL LOW (ref 80.0–100.0)
Platelets: 289 10*3/uL (ref 150–400)
RBC: 5.02 MIL/uL (ref 4.22–5.81)
RDW: 17.8 % — ABNORMAL HIGH (ref 11.5–15.5)
WBC: 6.9 10*3/uL (ref 4.0–10.5)
nRBC: 0 % (ref 0.0–0.2)

## 2018-10-10 LAB — HEPARIN LEVEL (UNFRACTIONATED)
HEPARIN UNFRACTIONATED: 0.41 [IU]/mL (ref 0.30–0.70)
Heparin Unfractionated: 0.14 IU/mL — ABNORMAL LOW (ref 0.30–0.70)
Heparin Unfractionated: 0.46 IU/mL (ref 0.30–0.70)

## 2018-10-10 MED ORDER — PERFLUTREN LIPID MICROSPHERE
1.0000 mL | INTRAVENOUS | Status: AC | PRN
  Administered 2018-10-10: 2 mL via INTRAVENOUS
  Filled 2018-10-10: qty 10

## 2018-10-10 MED ORDER — HEPARIN BOLUS VIA INFUSION
1500.0000 [IU] | Freq: Once | INTRAVENOUS | Status: AC
  Administered 2018-10-10: 1500 [IU] via INTRAVENOUS
  Filled 2018-10-10: qty 1500

## 2018-10-10 MED ORDER — FUROSEMIDE 10 MG/ML IJ SOLN
20.0000 mg | Freq: Two times a day (BID) | INTRAMUSCULAR | Status: DC
  Administered 2018-10-10 – 2018-10-11 (×2): 20 mg via INTRAVENOUS
  Filled 2018-10-10: qty 2

## 2018-10-10 MED ORDER — TECHNETIUM TC 99M TETROFOSMIN IV KIT
30.0000 | PACK | Freq: Once | INTRAVENOUS | Status: AC | PRN
  Administered 2018-10-10: 30.862 via INTRAVENOUS

## 2018-10-10 MED ORDER — REGADENOSON 0.4 MG/5ML IV SOLN
0.4000 mg | Freq: Once | INTRAVENOUS | Status: AC
  Administered 2018-10-10: 0.4 mg via INTRAVENOUS

## 2018-10-10 MED ORDER — FUROSEMIDE 10 MG/ML IJ SOLN
40.0000 mg | Freq: Two times a day (BID) | INTRAMUSCULAR | Status: DC
  Filled 2018-10-10: qty 4

## 2018-10-10 NOTE — Progress Notes (Addendum)
[place] [facility] [place] NAME: Marc [name]    MR#:  [id number]  DATE OF BIRTH:  04/01/1976  SUBJECTIVE:  CHIEF COMPLAINT:   Chief Complaint  Patient presents with  . Chest Pain  . Shortness of Breath  Patient seen and evaluated today No complaints of chest pain Has shortness of breath No palpitations Had runs of nonsustained V. tach yesterday Amiodarone drip could not be started because of low blood pressure REVIEW OF SYSTEMS:    ROS  CONSTITUTIONAL: No documented fever. No fatigue, weakness. No weight gain, no weight loss.  EYES: No blurry or double vision.  ENT: No tinnitus. No postnasal drip. No redness of the oropharynx.  RESPIRATORY: No cough, no wheeze, no hemoptysis. No dyspnea.  CARDIOVASCULAR: no chest pain. Has orthopnea. No palpitations. No syncope.  GASTROINTESTINAL: No nausea, no vomiting or diarrhea. No abdominal pain. No melena or hematochezia.  GENITOURINARY: No dysuria or hematuria.  ENDOCRINE: No polyuria or nocturia. No heat or cold intolerance.  HEMATOLOGY: No anemia. No bruising. No bleeding.  INTEGUMENTARY: No rashes. No lesions.  MUSCULOSKELETAL: No arthritis. Has swelling. No gout.  NEUROLOGIC: No numbness, tingling, or ataxia. No seizure-type activity.  PSYCHIATRIC: No anxiety. No insomnia. No ADD.   DRUG ALLERGIES:   Allergies  Allergen Reactions  . Tramadol Other (See Comments)    Pt stated that it gave him sores.  . Amoxicillin Rash  . Zithromax [Azithromycin] Rash    VITALS:  Blood pressure 115/73, pulse 80, temperature 97.8 F (36.6 C), temperature source Oral, resp. rate 18, height 5\' 8"  (1.727 m), weight (!) 161.5 kg, SpO2 95 %.  PHYSICAL EXAMINATION:   Physical Exam  GENERAL:  43 y.o.-year-old patient lying in the bed with no acute distress.  EYES: Pupils equal, round, reactive to light and accommodation. No scleral icterus. Extraocular muscles intact.  HEENT: Head atraumatic,  normocephalic. Oropharynx and nasopharynx clear.  NECK:  Supple, no jugular venous distention. No thyroid enlargement, no tenderness.  LUNGS: Decreased breath sounds bilaterally, bibasilar crepitations heard. No use of accessory muscles of respiration.  CARDIOVASCULAR: S1, S2 normal. No murmurs, rubs, or gallops.  ABDOMEN: Soft, nontender, nondistended. Bowel sounds present. No organomegaly or mass.  EXTREMITIES: No cyanosis, clubbing  Has edema    NEUROLOGIC: Cranial nerves II through XII are intact. No focal Motor or sensory deficits b/l.   PSYCHIATRIC: The patient is alert and oriented x 3.  SKIN: No obvious rash, lesion, or ulcer.   LABORATORY PANEL:   CBC Recent Labs  Lab 10/10/18 1228  WBC 6.9  HGB 11.2*  HCT 39.0  PLT 289   ------------------------------------------------------------------------------------------------------------------ Chemistries  Recent Labs  Lab 10/09/18 0245 10/09/18 1956  NA 140  --   K 3.7  --   CL 112*  --   CO2 24  --   GLUCOSE 112*  --   BUN 9  --   CREATININE 1.02  --   CALCIUM 8.5*  --   MG  --  2.2   ------------------------------------------------------------------------------------------------------------------  Cardiac Enzymes Recent Labs  Lab 10/09/18 1956  TROPONINI 0.41*   ------------------------------------------------------------------------------------------------------------------  RADIOLOGY:  Dg Chest Port 1 View  Result Date: 10/09/2018 CLINICAL DATA:  Cough. EXAM: PORTABLE CHEST 1 VIEW COMPARISON:  10/09/2018 FINDINGS: The cardio pericardial silhouette is enlarged. There is pulmonary vascular congestion without overt pulmonary edema. Component of underlying interstitial pulmonary edema suspected. No pleural effusion. The visualized bony structures of the thorax are intact. Telemetry leads overlie the  chest. IMPRESSION: Interval improvement in the diffuse interstitial opacity seen on previous study suggesting  improving edema. Electronically Signed   By: Misty Stanley M.D.   On: 10/09/2018 21:35   Dg Chest Port 1 View  Result Date: 10/09/2018 CLINICAL DATA:  Chest pain EXAM: PORTABLE CHEST 1 VIEW COMPARISON:  None. FINDINGS: Cardiomegaly with globular cardiac configuration. Vascular congestion with moderate interstitial and hazy opacity. No pleural effusion or pneumothorax. IMPRESSION: 1. Cardiomegaly with globular cardiac configuration either due to multi chamber enlargement or pericardial effusion. 2. Vascular congestion with moderate interstitial pulmonary edema Electronically Signed   By: Donavan Foil M.D.   On: 10/09/2018 02:56     ASSESSMENT AND PLAN:  43 year old male patient with history of chronic congestive heart failure, hypertension currently under hospitalist service for chest pain and shortness of breath  -Non-STEMI Continue patient on aspirin, beta-blocker and statin medication Cardiology consultation appreciated IV heparin drip for anticoagulation Cardiac stress test today Troponins were elevated Check echocardiogram  Telemetry monitoring  -Acute on chronic systolic heart failure exacerbation Continue Lasix for diuresis Cardiology evaluation Daily body weights and input output chart  -Hypertension Resume Coreg and lisinopril  -DVT prophylaxis On anticoagulation with heparin drip  -Tobacco abuse Tobacco cessation counseled to the patient for 6 minutes Nicotine patch offered  All the records are reviewed and case discussed with Care Management/Social Worker. Management plans discussed with the patient, family and they are in agreement.  CODE STATUS: Full code  DVT Prophylaxis: SCDs  TOTAL TIME TAKING CARE OF THIS PATIENT: 35 minutes.   POSSIBLE D/C IN 2 to 3 DAYS, DEPENDING ON CLINICAL CONDITION.  Saundra Shelling M.D on 10/10/2018 at 12:48 PM  Between 7am to 6pm - Pager - [phone]  After 6pm go to [email or web address] - password [email or web address]  Hospitalists  Office  [phone]  CC: Primary care physician; Patient, No Pcp Per  Note: This dictation was prepared with Dragon dictation along with smaller phrase technology. Any transcriptional errors that result from this process are unintentional.

## 2018-10-10 NOTE — Progress Notes (Signed)
ANTICOAGULATION CONSULT NOTE - Initial Consult  Pharmacy Consult for  Heparin  Indication: chest pain/ACS  Allergies  Allergen Reactions  . Tramadol Other (See Comments)    Pt stated that it gave him sores.  . Amoxicillin Rash  . Zithromax [Azithromycin] Rash    Patient Measurements: Height: 5\' 8"  (172.7 cm) Weight: (!) 356 lb 0.7 oz (161.5 kg) IBW/kg (Calculated) : 68.4 Heparin Dosing Weight: 108.5 kg   Vital Signs: Temp: 99.1 F (37.3 C) (02/24 1958) Temp Source: Oral (02/24 1958) BP: 101/78 (02/24 1958) Pulse Rate: 69 (02/24 1958)  Labs: Recent Labs    10/09/18 0245  10/09/18 1029 10/09/18 1257 10/09/18 1608  10/09/18 1956 10/10/18 0515 10/10/18 1228 10/10/18 1954  HGB 11.2*  --   --   --   --   --   --   --  11.2*  --   HCT 38.2*  --   --   --   --   --   --   --  39.0  --   PLT 306  --   --   --   --   --   --   --  289  --   APTT  --   --   --  30  --   --   --   --   --   --   LABPROT  --   --   --  14.2  --   --   --   --   --   --   INR  --   --   --  1.11  --   --   --   --   --   --   HEPARINUNFRC  --   --   --   --   --    < > <0.10* 0.41 0.14* 0.46  CREATININE 1.02  --   --   --   --   --   --   --   --   --   TROPONINI 0.04*   < > 0.21*  --  0.41*  --  0.41*  --   --   --    < > = values in this interval not displayed.    Estimated Creatinine Clearance: 138 mL/min (by C-G formula based on SCr of 1.02 mg/dL).   Medical History: Past Medical History:  Diagnosis Date  . CHF (congestive heart failure) ([place])   . Hypertension     Medications:  Medications Prior to Admission  Medication Sig Dispense Refill Last Dose  . albuterol (PROVENTIL HFA;VENTOLIN HFA) 108 (90 Base) MCG/ACT inhaler Inhale 2 puffs into the lungs every 6 (six) hours as needed for wheezing.   prn at prn  . aspirin 81 MG tablet Take 81 mg by mouth daily.   10/08/2018 at 0700  . carvedilol (COREG) 25 MG tablet Take 25 mg by mouth 2 (two) times daily.   Past Month at Unknown time   . cyclobenzaprine (FLEXERIL) 10 MG tablet Take 10 mg by mouth 3 (three) times daily as needed for muscle spasms.   Past Month at Unknown time  . furosemide (LASIX) 40 MG tablet Take 40 mg by mouth 2 (two) times daily.   10/08/2018 at 0700  . lisinopril (PRINIVIL,ZESTRIL) 40 MG tablet Take 40 mg by mouth daily.   Past Month at Unknown time    Assessment: Pharmacy consulted for heparin dosing and monitoring in 43 yo male admitted with  ACS/NSTEMI  2/24 0515 HL 0.41 - continued current rate of 1600 unit/hr 2/24 1228 HL 0.14 - heparin has not stopped and has been continuously running. Will increase the rate by  300 units 2/24 1954 HL 0.46  Goal of Therapy:  Heparin level 0.3-0.7 units/ml Monitor platelets by anticoagulation protocol: Yes   Plan:  Heparin level is within therapeutic range, will continue with current rate of  1900 units/hr. Will check next HL in 6 hours. Daily CBC while on Heparin drip.  Paulina Fusi, PharmD, BCPS 10/10/2018 8:54 PM

## 2018-10-10 NOTE — Progress Notes (Signed)
*  PRELIMINARY RESULTS* Echocardiogram 2D Echocardiogram has been performed.  Marc [name] 10/10/2018, 1:14 PM

## 2018-10-10 NOTE — Progress Notes (Signed)
ANTICOAGULATION CONSULT NOTE - Initial Consult  Pharmacy Consult for  Heparin  Indication: chest pain/ACS  Allergies  Allergen Reactions  . Tramadol Other (See Comments)    Pt stated that it gave him sores.  . Amoxicillin Rash  . Zithromax [Azithromycin] Rash    Patient Measurements: Height: 5\' 8"  (172.7 cm) Weight: (!) 357 lb 12.8 oz (162.3 kg) IBW/kg (Calculated) : 68.4 Heparin Dosing Weight: 108.5 kg   Vital Signs: BP: 100/65 (02/24 0511) Pulse Rate: 78 (02/24 0511)  Labs: Recent Labs    10/09/18 0245  10/09/18 1029 10/09/18 1257 10/09/18 1608 10/09/18 1956 10/10/18 0515  HGB 11.2*  --   --   --   --   --   --   HCT 38.2*  --   --   --   --   --   --   PLT 306  --   --   --   --   --   --   APTT  --   --   --  30  --   --   --   LABPROT  --   --   --  14.2  --   --   --   INR  --   --   --  1.11  --   --   --   HEPARINUNFRC  --   --   --   --   --  <0.10* 0.41  CREATININE 1.02  --   --   --   --   --   --   TROPONINI 0.04*   < > 0.21*  --  0.41* 0.41*  --    < > = values in this interval not displayed.    Estimated Creatinine Clearance: 138.6 mL/min (by C-G formula based on SCr of 1.02 mg/dL).   Medical History: Past Medical History:  Diagnosis Date  . CHF (congestive heart failure) ([place])   . Hypertension     Medications:  Medications Prior to Admission  Medication Sig Dispense Refill Last Dose  . albuterol (PROVENTIL HFA;VENTOLIN HFA) 108 (90 Base) MCG/ACT inhaler Inhale 2 puffs into the lungs every 6 (six) hours as needed for wheezing.   prn at prn  . aspirin 81 MG tablet Take 81 mg by mouth daily.   10/08/2018 at 0700  . carvedilol (COREG) 25 MG tablet Take 25 mg by mouth 2 (two) times daily.   Past Month at Unknown time  . cyclobenzaprine (FLEXERIL) 10 MG tablet Take 10 mg by mouth 3 (three) times daily as needed for muscle spasms.   Past Month at Unknown time  . furosemide (LASIX) 40 MG tablet Take 40 mg by mouth 2 (two) times daily.   10/08/2018 at  0700  . lisinopril (PRINIVIL,ZESTRIL) 40 MG tablet Take 40 mg by mouth daily.   Past Month at Unknown time    Assessment: Pharmacy consulted for heparin dosing and monitoring in 43 yo male admitted with ACS/NSTEMI  Goal of Therapy:  Heparin level 0.3-0.7 units/ml Monitor platelets by anticoagulation protocol: Yes   Plan:  Dosing Weight 104 Kg Baseline labs ordered  Give 4000 units bolus x 1 Start heparin infusion at 1200 units/hr Check anti-Xa level in 6 hours and daily while on heparin Continue to monitor H&H and platelets  2/23:  HL @ 2000 = < 0.1 Will order Heparin 3250 units IV X 1 bolus and increase drip rate to 1600 units/hr.  Will recheck HL 6 hrs after  rate change.   2/24 0500 heparin level 0.41. Continue current regimen. Recheck heparin level in 6 hours to confirm.  Ashlin Hidalgo S 10/10/2018,6:14 AM

## 2018-10-10 NOTE — Plan of Care (Signed)
  Problem: Activity: Goal: Capacity to carry out activities will improve Outcome: Not Progressing  Dyspnea with exertion/ O2 acute

## 2018-10-10 NOTE — Progress Notes (Addendum)
ANTICOAGULATION CONSULT NOTE - Initial Consult  Pharmacy Consult for  Heparin  Indication: chest pain/ACS  Allergies  Allergen Reactions  . Tramadol Other (See Comments)    Pt stated that it gave him sores.  . Amoxicillin Rash  . Zithromax [Azithromycin] Rash    Patient Measurements: Height: 5\' 8"  (172.7 cm) Weight: (!) 356 lb 0.7 oz (161.5 kg) IBW/kg (Calculated) : 68.4 Heparin Dosing Weight: 108.5 kg   Vital Signs: Temp: 97.8 F (36.6 C) (02/24 0731) Temp Source: Oral (02/24 0731) BP: 115/73 (02/24 1217) Pulse Rate: 80 (02/24 1217)  Labs: Recent Labs    10/09/18 0245  10/09/18 1029 10/09/18 1257 10/09/18 1608 10/09/18 1956 10/10/18 0515 10/10/18 1228  HGB 11.2*  --   --   --   --   --   --  11.2*  HCT 38.2*  --   --   --   --   --   --  39.0  PLT 306  --   --   --   --   --   --  289  APTT  --   --   --  30  --   --   --   --   LABPROT  --   --   --  14.2  --   --   --   --   INR  --   --   --  1.11  --   --   --   --   HEPARINUNFRC  --   --   --   --   --  <0.10* 0.41 0.14*  CREATININE 1.02  --   --   --   --   --   --   --   TROPONINI 0.04*   < > 0.21*  --  0.41* 0.41*  --   --    < > = values in this interval not displayed.    Estimated Creatinine Clearance: 138 mL/min (by C-G formula based on SCr of 1.02 mg/dL).   Medical History: Past Medical History:  Diagnosis Date  . CHF (congestive heart failure) ([place])   . Hypertension     Medications:  Medications Prior to Admission  Medication Sig Dispense Refill Last Dose  . albuterol (PROVENTIL HFA;VENTOLIN HFA) 108 (90 Base) MCG/ACT inhaler Inhale 2 puffs into the lungs every 6 (six) hours as needed for wheezing.   prn at prn  . aspirin 81 MG tablet Take 81 mg by mouth daily.   10/08/2018 at 0700  . carvedilol (COREG) 25 MG tablet Take 25 mg by mouth 2 (two) times daily.   Past Month at Unknown time  . cyclobenzaprine (FLEXERIL) 10 MG tablet Take 10 mg by mouth 3 (three) times daily as needed for muscle  spasms.   Past Month at Unknown time  . furosemide (LASIX) 40 MG tablet Take 40 mg by mouth 2 (two) times daily.   10/08/2018 at 0700  . lisinopril (PRINIVIL,ZESTRIL) 40 MG tablet Take 40 mg by mouth daily.   Past Month at Unknown time    Assessment: Pharmacy consulted for heparin dosing and monitoring in 43 yo male admitted with ACS/NSTEMI  2/24 0515 HL 0.41 - continued current rate of 1600 unit/hr 2/24 1228 HL 0.14 - heparin has not stopped and has been continuously running. Will increase the rate by  300 units  Goal of Therapy:  Heparin level 0.3-0.7 units/ml Monitor platelets by anticoagulation protocol: Yes   Plan:  Will give  a 1500 unit bolus and increase the rate to 1900 units/hr and recheck level in 6 hours due to subtherapeutic level. RN checked line and saw no issues.   Oswald Hillock, PharmD, BCPS Clinical Pharmacist  10/10/2018,1:04 PM

## 2018-10-10 NOTE — Consult Note (Signed)
[facility] Nurse wound consult note Reason for Consult: Fungal overgrowth between toes. Poor hygiene and patient has difficulty washing and drying this area.  Wound type:Moisture associated skin damage  Fungal overgrowth Pressure Injury POA: NA Measurement: erythema and musty odor between toes.  Feet are cracked and calloused.  Wound DJM:EQAS Drainage (amount, consistency, odor) moist and musty Periwound:intact Dressing procedure/placement/frequency:Cut interdry into strips and weave in between toes to wick moisture and combat yeast:  Measure and cut length of InterDry Ag+ to fit in skin folds that have skin breakdown Tuck InterDry  Ag+ fabric into skin folds in a single layer, allow for 2 inches of overhang from skin edges to allow for wicking to occur May remove to bathe; dry area thoroughly and then tuck into affected areas again Do not apply any creams or ointments when using InterDry Ag+ DO NOT THROW AWAY FOR 5 DAYS unless soiled with stool DO NOT [facility] product, this will inactivate the silver in the material  New sheet of Interdry Ag+ should be applied after 5 days of use if patient continues to have skin breakdown   Will not follow at this time.  Please re-consult if needed.  Domenic Moras MSN, RN, FNP-BC CWON Wound, Ostomy, Continence Nurse Pager [phone]

## 2018-10-11 ENCOUNTER — Inpatient Hospital Stay: Payer: Self-pay

## 2018-10-11 LAB — BASIC METABOLIC PANEL
Anion gap: 7 (ref 5–15)
BUN: 21 mg/dL — ABNORMAL HIGH (ref 6–20)
CALCIUM: 8.6 mg/dL — AB (ref 8.9–10.3)
CO2: 24 mmol/L (ref 22–32)
Chloride: 106 mmol/L (ref 98–111)
Creatinine, Ser: 1.16 mg/dL (ref 0.61–1.24)
GFR calc Af Amer: >60 mL/min (ref >60)
GFR calc non Af Amer: >60 mL/min (ref >60)
Glucose, Bld: 106 mg/dL — ABNORMAL HIGH (ref 70–99)
Potassium: 4.4 mmol/L (ref 3.5–5.1)
Sodium: 137 mmol/L (ref 135–145)

## 2018-10-11 LAB — CBC
HCT: 37.2 % — ABNORMAL LOW (ref 39.0–52.0)
Hemoglobin: 10.8 g/dL — ABNORMAL LOW (ref 13.0–17.0)
MCH: 22.3 pg — ABNORMAL LOW (ref 26.0–34.0)
MCHC: 29 g/dL — ABNORMAL LOW (ref 30.0–36.0)
MCV: 76.9 fL — ABNORMAL LOW (ref 80.0–100.0)
Platelets: 304 10*3/uL (ref 150–400)
RBC: 4.84 MIL/uL (ref 4.22–5.81)
RDW: 17.7 % — ABNORMAL HIGH (ref 11.5–15.5)
WBC: 7.6 10*3/uL (ref 4.0–10.5)
nRBC: 0 % (ref 0.0–0.2)

## 2018-10-11 LAB — ECHOCARDIOGRAM COMPLETE
Height: 68 in
Weight: 5696.69 oz

## 2018-10-11 LAB — NM MYOCAR MULTI W/SPECT W/WALL MOTION / EF
Estimated workload: 1 METS
Exercise duration (min): 1 min
Exercise duration (sec): 0 s
LV dias vol: 279 mL (ref 62–150)
LV sys vol: 209 mL
MPHR: 176 {beats}/min
Peak HR: 98 {beats}/min
Percent HR: 55 %
Rest HR: 81 {beats}/min
SDS: 4
SRS: 11
SSS: 6
TID: 1.06

## 2018-10-11 LAB — HEPARIN LEVEL (UNFRACTIONATED): Heparin Unfractionated: 0.63 IU/mL (ref 0.30–0.70)

## 2018-10-11 LAB — TROPONIN I: Troponin I: 0.35 ng/mL (ref <0.03)

## 2018-10-11 MED ORDER — ALBUTEROL SULFATE HFA 108 (90 BASE) MCG/ACT IN AERS: 2.0000 | INHALATION_SPRAY | Freq: Four times a day (QID) | RESPIRATORY_TRACT | 1 refills | Status: DC | PRN

## 2018-10-11 MED ORDER — CYCLOBENZAPRINE HCL 10 MG PO TABS: 10.0000 mg | ORAL_TABLET | Freq: Three times a day (TID) | ORAL | 0 refills | Status: AC | PRN

## 2018-10-11 MED ORDER — AMIODARONE HCL 200 MG PO TABS: 400.0000 mg | ORAL_TABLET | Freq: Every day | ORAL | Status: DC

## 2018-10-11 MED ORDER — TECHNETIUM TC 99M TETROFOSMIN IV KIT
30.0000 | PACK | Freq: Once | INTRAVENOUS | Status: AC | PRN
  Administered 2018-10-11: 29.032 via INTRAVENOUS

## 2018-10-11 MED ORDER — AMIODARONE HCL 400 MG PO TABS: 400.0000 mg | ORAL_TABLET | Freq: Every day | ORAL | 0 refills | Status: DC

## 2018-10-11 MED ORDER — CARVEDILOL 25 MG PO TABS: 25.0000 mg | ORAL_TABLET | Freq: Two times a day (BID) | ORAL | 11 refills | Status: DC

## 2018-10-11 MED ORDER — ATORVASTATIN CALCIUM 40 MG PO TABS: 40.0000 mg | ORAL_TABLET | Freq: Every day | ORAL | 0 refills | Status: DC

## 2018-10-11 MED ORDER — FUROSEMIDE 40 MG PO TABS: 40.0000 mg | ORAL_TABLET | Freq: Two times a day (BID) | ORAL | 1 refills | Status: DC

## 2018-10-11 MED ORDER — ASPIRIN 81 MG PO TABS: 81.0000 mg | ORAL_TABLET | Freq: Every day | ORAL | 0 refills | Status: AC

## 2018-10-11 MED ORDER — LISINOPRIL 40 MG PO TABS: 40.0000 mg | ORAL_TABLET | Freq: Every day | ORAL | 0 refills | Status: DC

## 2018-10-11 NOTE — Care Management Note (Addendum)
Case Management Note  Patient Details  Name: Marc [name] MRN: [id number] Date of Birth: 12/27/1975  Subjective/Objective:   Patient recently moved from [facility] to [place].  He has been admitted with cheat pain, CHF exacerbation.  Performing stress test; half yesterday and half today.  Patient does not have insurance or a job.  He was recently on disability and for some reason lost it.  He is in the process of applying again for it.  Will send all prescriptions to [facility] at [place].  Explained process to patient and that he would need to see a PCP in the county.  Referral made to [facility].  Patient is very grateful for the assistance and states he will follow up with them.  He has 2 scales at home.  He lives with his wife and [place] in [place].  His family is able to transport him to appointments.    Action/Plan:   Expected Discharge Date:  10/09/18               Expected Discharge Plan:  Home/Self Care  In-House Referral:     Discharge planning Services  CM Consult, Medication Assistance, [facility]  Post Acute Care Choice:    Choice offered to:     DME Arranged:    DME Agency:     HH Arranged:    HH Agency:     Status of Service:  Completed, signed off  If discussed at [employer], dates discussed:    Additional Comments:  Elza Rafter, RN 10/11/2018, 11:46 AM

## 2018-10-11 NOTE — Progress Notes (Signed)
Savyon L Pharr to be D/C'd Home per MD order.  Discussed prescriptions and follow up appointments with the patient. Medications given to pt. medication list explained in detail. Pt verbalized understanding. Wife at bedside to transport pt home.  Allergies as of 10/11/2018      Reactions   Tramadol Other (See Comments)   Pt stated that it gave him sores.   Amoxicillin Rash   Zithromax [azithromycin] Rash      Medication List    TAKE these medications   albuterol 108 (90 Base) MCG/ACT inhaler Commonly known as:  PROVENTIL HFA;VENTOLIN HFA Inhale 2 puffs into the lungs every 6 (six) hours as needed for up to 30 days for wheezing or shortness of breath. What changed:  reasons to take this   amiodarone 400 MG tablet Commonly known as:  PACERONE Take 1 tablet (400 mg total) by mouth daily for 30 days.   aspirin 81 MG tablet Take 1 tablet (81 mg total) by mouth daily for 30 days.   atorvastatin 40 MG tablet Commonly known as:  LIPITOR Take 1 tablet (40 mg total) by mouth daily at 6 PM for 30 days.   carvedilol 25 MG tablet Commonly known as:  COREG Take 1 tablet (25 mg total) by mouth 2 (two) times daily.   cyclobenzaprine 10 MG tablet Commonly known as:  FLEXERIL Take 1 tablet (10 mg total) by mouth 3 (three) times daily as needed for up to 5 days for muscle spasms.   furosemide 40 MG tablet Commonly known as:  LASIX Take 1 tablet (40 mg total) by mouth 2 (two) times daily.   lisinopril 40 MG tablet Commonly known as:  PRINIVIL,ZESTRIL Take 1 tablet (40 mg total) by mouth daily for 30 days.       Vitals:   10/11/18 1407 10/11/18 1411  BP: (!) 94/50 (!) 108/59  Pulse: 76 79  Resp: 18 18  Temp:  97.6 F (36.4 C)  SpO2: 93% 97%    Tele box removed and returned. Skin clean, dry and intact without evidence of skin break down, no evidence of skin tears noted. IV catheter discontinued intact. Site without signs and symptoms of complications. Dressing and pressure  applied. Pt denies pain at this time. No complaints noted.  An After Visit Summary was printed and given to the patient. Patient escorted via [place], and D/C home via private auto.  Rolley Sims

## 2018-10-11 NOTE — Progress Notes (Signed)
Pt back from stress test, complaining of sob, chest pain 10/10, pt reports its a "stabbing pain" non radiating, complaining of headache, placed pt on 2 L n/c, sublingual nitro given x2, morphine, and IV lasix given as well, BP dropped to 109/69, Dr. Estanislado Pandy notified and okay per MD to hold carvidelol and lisinopril at this time. Dr. Clayborn Bigness also made aware.Will continue to monitor closely

## 2018-10-11 NOTE — Discharge Summary (Signed)
Marc [name] at [place] NAME: Marc [name]    MR#:  [id number]  DATE OF BIRTH:  1976-07-09  DATE OF ADMISSION:  10/09/2018 ADMITTING PHYSICIAN: Saundra Shelling, MD  DATE OF DISCHARGE:   PRIMARY CARE PHYSICIAN: Marc [name], No Pcp Per   ADMISSION DIAGNOSIS:  Chest pain, unspecified type [R07.9] Non-STEMI (non-ST elevated myocardial infarction) ([place]) [I21.4]  DISCHARGE DIAGNOSIS:  Acute on chronic systolic heart failure exacerbation Abnormal troponin secondary to heart failure exacerbation Chest pain Tobacco abuse Sleep apnea SECONDARY DIAGNOSIS:   Past Medical History:  Diagnosis Date  . CHF (congestive heart failure) ([place])   . Hypertension      ADMITTING HISTORY The Marc [name] with past medical history of CHF and hypertension presents to the emergency department complaining of shortness of breath with associated chest pain.  It actually began 24 hours ago and the Marc [name] took 324 mg of baby aspirin.  He has felt weak and mildly nauseous all day which prompted him to go to bed early this evening.  His pain awoke him from sleep.  The pain radiates to his back and left arm.  The Marc [name] admits that he has not had his medication for approximately 1 month as he has lost his Medicaid for now.  Laboratory evaluation revealed mild elevation in troponin as well as increased BNP.  He was given a dose of Lasix in the emergency department prior to the hospital service being called for admission.  HOSPITAL COURSE:  And was admitted to telemetry.  Marc [name] anticoagulated with IV heparin drip, started on aspirin, beta-blocker, ACE inhibitor and diuresed with Lasix.  His troponins were elevated. Marc [name] Marc [name] was seen by cardiology attending Dr. Clayborn Bigness.  Marc [name] was worked up with echocardiogram.  He had a cardiac stress test which was normal with no evidence of ischemia.  Echocardiogram showed severely reduced systolic function with EF of 20 to 25%. Cardiac  stress test  Blood pressure demonstrated a normal response to exercise.  There was no ST segment deviation noted during stress.  The study is normal.  This is a low risk study.  The left ventricular ejection fraction is severely decreased (<30%).  Discussed with cardiology Marc [name] will be discharged home on medical management CONSULTS OBTAINED:  Treatment Team:  Yolonda Kida, MD  DRUG ALLERGIES:   Allergies  Allergen Reactions  . Tramadol Other (See Comments)    Pt stated that it gave him sores.  . Amoxicillin Rash  . Zithromax [Azithromycin] Rash    DISCHARGE MEDICATIONS:   Allergies as of 10/11/2018      Reactions   Tramadol Other (See Comments)   Pt stated that it gave him sores.   Amoxicillin Rash   Zithromax [azithromycin] Rash      Medication List    TAKE these medications   albuterol 108 (90 Base) MCG/ACT inhaler Commonly known as:  PROVENTIL HFA;VENTOLIN HFA Inhale 2 puffs into the lungs every 6 (six) hours as needed for up to 30 days for wheezing or shortness of breath. What changed:  reasons to take this   amiodarone 400 MG tablet Commonly known as:  PACERONE Take 1 tablet (400 mg total) by mouth daily for 30 days.   aspirin 81 MG tablet Take 1 tablet (81 mg total) by mouth daily for 30 days.   atorvastatin 40 MG tablet Commonly known as:  LIPITOR Take 1 tablet (40 mg total) by mouth daily at 6 PM for 30 days.   carvedilol 25 MG  tablet Commonly known as:  COREG Take 1 tablet (25 mg total) by mouth 2 (two) times daily.   cyclobenzaprine 10 MG tablet Commonly known as:  FLEXERIL Take 1 tablet (10 mg total) by mouth 3 (three) times daily as needed for up to 5 days for muscle spasms.   furosemide 40 MG tablet Commonly known as:  LASIX Take 1 tablet (40 mg total) by mouth 2 (two) times daily.   lisinopril 40 MG tablet Commonly known as:  PRINIVIL,ZESTRIL Take 1 tablet (40 mg total) by mouth daily for 30 days.       Today  Marc [name]  seen and evaluated today No shortness of breath No chest pain Had cardiac stress test which was normal Hemodynamically stable Discussed with cardiology Will be discharged home and follow-up with cardiology in the clinic  VITAL SIGNS:  Blood pressure (!) 108/59, pulse 79, temperature 97.6 F (36.4 C), temperature source Oral, resp. rate 18, height 5\' 8"  (1.727 m), weight (!) 164.6 kg, SpO2 97 %.  I/O:    Intake/Output Summary (Last 24 hours) at 10/11/2018 1423 Last data filed at 10/11/2018 1357 Gross per 24 hour  Intake 1020.77 ml  Output 1150 ml  Net -129.23 ml    PHYSICAL EXAMINATION:  Physical Exam  GENERAL:  43 y.o.-year-old Marc [name] lying in the bed with no acute distress.  LUNGS: Normal breath sounds bilaterally, no wheezing, rales,rhonchi or crepitation. No use of accessory muscles of respiration.  CARDIOVASCULAR: S1, S2 normal. No murmurs, rubs, or gallops.  ABDOMEN: Soft, non-tender, non-distended. Bowel sounds present. No organomegaly or mass.  NEUROLOGIC: Moves all 4 extremities. PSYCHIATRIC: The Marc [name] is alert and oriented x 3.  SKIN: No obvious rash, lesion, or ulcer.   DATA REVIEW:   CBC Recent Labs  Lab 10/11/18 0330  WBC 7.6  HGB 10.8*  HCT 37.2*  PLT 304    Chemistries  Recent Labs  Lab 10/09/18 1956 10/11/18 0330  NA  --  137  K  --  4.4  CL  --  106  CO2  --  24  GLUCOSE  --  106*  BUN  --  21*  CREATININE  --  1.16  CALCIUM  --  8.6*  MG 2.2  --     Cardiac Enzymes Recent Labs  Lab 10/11/18 0330  TROPONINI 0.35*    Microbiology Results  No results found for this or any previous visit.  RADIOLOGY:  Nm Myocar Multi W/spect W/wall Motion / Ef  Result Date: 10/11/2018  Blood pressure demonstrated a normal response to exercise.  There was no ST segment deviation noted during stress.  The study is normal.  This is a low risk study.  The left ventricular ejection fraction is severely decreased (<30%).    Dg Chest Port 1  View  Result Date: 10/09/2018 CLINICAL DATA:  Cough. EXAM: PORTABLE CHEST 1 VIEW COMPARISON:  10/09/2018 FINDINGS: The cardio pericardial silhouette is enlarged. There is pulmonary vascular congestion without overt pulmonary edema. Component of underlying interstitial pulmonary edema suspected. No pleural effusion. The visualized bony structures of the thorax are intact. Telemetry leads overlie the chest. IMPRESSION: Interval improvement in the diffuse interstitial opacity seen on previous study suggesting improving edema. Electronically Signed   By: Misty Stanley M.D.   On: 10/09/2018 21:35    Follow up with PCP in 1 week.  Management plans discussed with the Marc [name], family and they are in agreement.  CODE STATUS: Full code    Code Status Orders  (  From admission, onward)         Start     Ordered   10/09/18 1014  Full code  Continuous     10/09/18 1013        Code Status History    This Marc [name] has a current code status but no historical code status.    Advance Directive Documentation     Most Recent Value  Type of Advance Directive  Living will  Pre-existing out of facility DNR order (yellow form or pink MOST form)  -  "MOST" Form in Place?  -      TOTAL TIME TAKING CARE OF THIS Marc [name] ON DAY OF DISCHARGE: more than 35 minutes.   Saundra Shelling M.D on 10/11/2018 at 2:23 PM  Between 7am to 6pm - Pager - [phone]  After 6pm go to [email or web address] - password [email or web address] [facility]  Office  [phone]  CC: Primary care physician; Marc [name], No Pcp Per  Note: This dictation was prepared with Dragon dictation along with smaller phrase technology. Any transcriptional errors that result from this process are unintentional.

## 2018-10-11 NOTE — Plan of Care (Signed)
Nutrition Education Note  RD consulted for nutrition education regarding CHF.  43 y/o male admitted with CHF and NSTEMI   RD provided "Low Sodium Nutrition Therapy" handout from the Academy of Nutrition and Dietetics. Reviewed patient's dietary recall. Provided examples on ways to decrease sodium intake in diet. Discouraged intake of processed foods and use of salt shaker. Encouraged fresh fruits and vegetables as well as whole grain sources of carbohydrates to maximize fiber intake.   RD discussed why it is important for patient to adhere to diet recommendations, and emphasized the role of fluids, foods to avoid, and importance of weighing self daily. Teach back method used.  Expect good compliance.  Body mass index is 55.18 kg/m. Pt meets criteria for morbid obesity based on current BMI.  Current diet order is HH, patient is consuming approximately 100% of meals at this time. Labs and medications reviewed. No further nutrition interventions warranted at this time. RD contact information provided. If additional nutrition issues arise, please re-consult RD.   Koleen Distance MS, RD, LDN Pager #- [phone] Office#- [phone] After Hours Pager: [phone]

## 2018-10-11 NOTE — Care Management (Signed)
Faxing all prescriptions to [facility] including Eliquis.  Patient's ride will not be here until 4pm.  Will have a volunteer pick up medications this afternoon and deliver to floor.

## 2018-10-11 NOTE — Progress Notes (Signed)
Cardiovascular and Pulmonary Nurse Navigator Note:    43 year old male with hx of CHF and HTN who presented to the ED with c/o SOB and chest pain.  Patient has been out of medication for approximately one month as he lost his Medicaid coverage.  Patient had elevated troponin and BNP on admission.  Patient was seen by cardiology attending Dr. Clayborn Bigness.   Echocardiogram performed.  Echocardiogram showed severely reduced systolic function with EF of 20 to 25%.  He had a cardiac stress test which was normal with no evidence of ischemia.    Rounded on patient to review Heart Failure education.  Patient has functioning scale and weighs himself daily.     CHF Education:?? Briefly reviewed the 5 steps to "Living Better with Heart Failure."   Briefly reviewed definition of heart failure and signs and symptoms of an exacerbation.?Explained to patient that HF is a chronic illness which requires self-assessment / self-management along with help from the cardiologist/PCP.?? ? *Reviewed importance of and reason behind checking weight daily in the AM, after using the bathroom, but before getting dressed. Patient has functioning scale and weighs himself daily.  ? *Reviewed with patient the following information: *Discussed when to call the Dr= weight gain of >2-3lb overnight of 5lb in a week,  *Discussed yellow zone= call MD: weight gain of >2-3lb overnight of 5lb in a week, increased swelling, increased SOB when lying down, chest discomfort, dizziness, increased fatigue *Red Zone= call 911: struggle to breath, fainting or near fainting, significant chest pain   *Diet - Reviewed low sodium diet-provided handout of recommended and not recommended foods.?Dietitian Consultation for education completed today. ? *Discussed fluid intake with patient as well. Patient on 1200 ml fluid restriction this admission.   Patient informed this RN that he has been just getting by and eating whatever he can get his hands on (like  bologna sandwiches) due to his social situation with no income, having his car repossessed, and his health issues.    Social issues - Patient lost disability last spring while he was going to school for another vocation.  Patient ended up having to pay for the education he was getting after he lost disability coverage. Patient reporting he just does not understand why Social Security dropped him from getting his disability.   Patient has been unable to hold down a job due to hospitalizations with CHF exacerbations, his car was just repossessed on Valentine's Day of this year, and all of this has put a strain on his marriage.    ? *Instructed patient to take medications as prescribed for heart failure. Explained briefly why pt is on the medications (either make you feel better, live longer or keep you out of the hospital) and discussed monitoring and side effects.  ? *Discussed exercise / activity.   Patient has been limited with his activity due to his SOB and fatigue with EF of 20 - 25%.  Explained to patient he is a candidate for Cardiac Rehab due to CHF with EF of 20 - 25%.  Overview of the program provided.  Patient unable to participate at this time, as patient has no payor source and lost his disability last spring.  Patient unable to think about participating at this time.    Social issues - Patient lost disability last spring while he was going to school for another vocation.  Patient ended up having to pay for the education he was getting after he lost disability coverage. Patient reporting he  just does not understand why Social Security dropped him from getting his disability.   Patient has been unable to hold down a job due to hospitalizations with CHF exacerbations, his car was just repossessed on Valentine's Day of this year, and all of this has put a strain on his marriage.     Encouraged patient to go to local [employer] and re-apply for disability and any other benefits as  soon as possible.   ? *Smoking Cessation- Patient is a current smoker.?"Thinking about Quitting - Yes You Can!" given to patient. With patient's current situation and stress level, patient is not ready to quit.    ? *[facility] - Explained the purpose of the HF Clinic. ?Explained to patient the HF Clinic does not replace PCP nor Cardiologist, but is an additional resource to helping patient manage heart failure at home. Patient has new patient appointment in the [facility] on 10/18/2018 at 12:20 p.m.   Patient has an appt to be seen in the [facility] on 11/08/2018 at 1:30 p.m.  and has an appt to see Dr. Clayborn Bigness on 10/13/2018 at 2:30 p.m.   ? Patient thanked me for providing the above information. ? ? Roanna Epley, RN, BSN, [facility]? [facility]  Cardiovascular &?Pulmonary Nurse Navigator  Direct Line: [phone]  Department Phone #: [phone] Fax: [phone]? Email Address: [email or web address]

## 2018-10-11 NOTE — Progress Notes (Signed)
ANTICOAGULATION CONSULT NOTE - Initial Consult  Pharmacy Consult for  Heparin  Indication: chest pain/ACS  Allergies  Allergen Reactions  . Tramadol Other (See Comments)    Pt stated that it gave him sores.  . Amoxicillin Rash  . Zithromax [Azithromycin] Rash    Patient Measurements: Height: 5\' 8"  (172.7 cm) Weight: (!) 356 lb 0.7 oz (161.5 kg) IBW/kg (Calculated) : 68.4 Heparin Dosing Weight: 108.5 kg   Vital Signs: Temp: 97.8 F (36.6 C) (02/25 0428) Temp Source: Oral (02/25 0428) BP: 103/77 (02/25 0428) Pulse Rate: 75 (02/25 0428)  Labs: Recent Labs    10/09/18 0245  10/09/18 1257 10/09/18 1608 10/09/18 1956  10/10/18 1228 10/10/18 1954 10/11/18 0330  HGB 11.2*  --   --   --   --   --  11.2*  --  10.8*  HCT 38.2*  --   --   --   --   --  39.0  --  37.2*  PLT 306  --   --   --   --   --  289  --  304  APTT  --   --  30  --   --   --   --   --   --   LABPROT  --   --  14.2  --   --   --   --   --   --   INR  --   --  1.11  --   --   --   --   --   --   HEPARINUNFRC  --   --   --   --  <0.10*   < > 0.14* 0.46 0.63  CREATININE 1.02  --   --   --   --   --   --   --  1.16  TROPONINI 0.04*   < >  --  0.41* 0.41*  --   --   --  0.35*   < > = values in this interval not displayed.    Estimated Creatinine Clearance: 121.4 mL/min (by C-G formula based on SCr of 1.16 mg/dL).   Medical History: Past Medical History:  Diagnosis Date  . CHF (congestive heart failure) ([place])   . Hypertension     Medications:  Medications Prior to Admission  Medication Sig Dispense Refill Last Dose  . albuterol (PROVENTIL HFA;VENTOLIN HFA) 108 (90 Base) MCG/ACT inhaler Inhale 2 puffs into the lungs every 6 (six) hours as needed for wheezing.   prn at prn  . aspirin 81 MG tablet Take 81 mg by mouth daily.   10/08/2018 at 0700  . carvedilol (COREG) 25 MG tablet Take 25 mg by mouth 2 (two) times daily.   Past Month at Unknown time  . cyclobenzaprine (FLEXERIL) 10 MG tablet Take 10 mg by  mouth 3 (three) times daily as needed for muscle spasms.   Past Month at Unknown time  . furosemide (LASIX) 40 MG tablet Take 40 mg by mouth 2 (two) times daily.   10/08/2018 at 0700  . lisinopril (PRINIVIL,ZESTRIL) 40 MG tablet Take 40 mg by mouth daily.   Past Month at Unknown time    Assessment: Pharmacy consulted for heparin dosing and monitoring in 43 yo male admitted with ACS/NSTEMI  2/24 0515 HL 0.41 - continued current rate of 1600 unit/hr 2/24 1228 HL 0.14 - heparin has not stopped and has been continuously running. Will increase the rate by  300 units 2/24 1954  HL 0.46  Goal of Therapy:  Heparin level 0.3-0.7 units/ml Monitor platelets by anticoagulation protocol: Yes   Plan:  02/25 @ 0300 HL 0.63 therapeutic. Will continue current rate and recheck w/ am labs. CBC stable will continue to monitor.  Tobie Lords, PharmD, BCPS Clinical Pharmacist 10/11/2018

## 2018-10-17 NOTE — Progress Notes (Deleted)
   Patient ID: Marc [name], male    DOB: 15-Nov-1975, 43 y.o.   MRN: [id number]  HPI  Marc [name] is a 43 y/o male with a history of  Echo report  Review of Systems    Physical Exam

## 2018-10-18 ENCOUNTER — Ambulatory Visit: Payer: Self-pay | Admitting: Family

## 2018-10-18 ENCOUNTER — Telehealth: Payer: Self-pay | Admitting: Family

## 2018-10-18 NOTE — Telephone Encounter (Signed)
Patient did not show for his Heart Failure Clinic appointment on 10/18/2018. Will attempt to reschedule.  

## 2018-12-15 ENCOUNTER — Telehealth: Payer: Self-pay | Admitting: Pharmacy Technician

## 2018-12-15 NOTE — Telephone Encounter (Signed)
Patient failed to provide requested financial documentation. Financial documentation is required in order to determine patient's eligibility for [facility] program. No additional medication assistance will be provided until patient provides requested financial documentation. Patient notified by letter.  Velda Shell CPhT/Eligibility Specialist [facility]

## 2019-01-29 ENCOUNTER — Emergency Department: Payer: Self-pay

## 2019-01-29 ENCOUNTER — Encounter: Payer: Self-pay | Admitting: Emergency Medicine

## 2019-01-29 ENCOUNTER — Inpatient Hospital Stay
Admission: EM | Admit: 2019-01-29 | Discharge: 2019-02-03 | DRG: 291 | Disposition: A | Discharge status: Home / Self Care | Payer: Self-pay | Attending: Internal Medicine | Admitting: Internal Medicine

## 2019-01-29 ENCOUNTER — Other Ambulatory Visit: Payer: Self-pay

## 2019-01-29 DIAGNOSIS — I429 Cardiomyopathy, unspecified: Secondary | ICD-10-CM | POA: Diagnosis present

## 2019-01-29 DIAGNOSIS — R0602 Shortness of breath: Secondary | ICD-10-CM

## 2019-01-29 DIAGNOSIS — Z881 Allergy status to other antibiotic agents status: Secondary | ICD-10-CM

## 2019-01-29 DIAGNOSIS — Z79899 Other long term (current) drug therapy: Secondary | ICD-10-CM

## 2019-01-29 DIAGNOSIS — I509 Heart failure, unspecified: Secondary | ICD-10-CM

## 2019-01-29 DIAGNOSIS — Z91120 Patient's intentional underdosing of medication regimen due to financial hardship: Secondary | ICD-10-CM

## 2019-01-29 DIAGNOSIS — T502X5A Adverse effect of carbonic-anhydrase inhibitors, benzothiadiazides and other diuretics, initial encounter: Secondary | ICD-10-CM | POA: Diagnosis not present

## 2019-01-29 DIAGNOSIS — R140 Abdominal distension (gaseous): Secondary | ICD-10-CM | POA: Diagnosis not present

## 2019-01-29 DIAGNOSIS — Z6841 Body Mass Index (BMI) 40.0 and over, adult: Secondary | ICD-10-CM

## 2019-01-29 DIAGNOSIS — T502X6A Underdosing of carbonic-anhydrase inhibitors, benzothiadiazides and other diuretics, initial encounter: Secondary | ICD-10-CM | POA: Diagnosis present

## 2019-01-29 DIAGNOSIS — T465X6A Underdosing of other antihypertensive drugs, initial encounter: Secondary | ICD-10-CM | POA: Diagnosis present

## 2019-01-29 DIAGNOSIS — F17210 Nicotine dependence, cigarettes, uncomplicated: Secondary | ICD-10-CM | POA: Diagnosis present

## 2019-01-29 DIAGNOSIS — Z880 Allergy status to penicillin: Secondary | ICD-10-CM

## 2019-01-29 DIAGNOSIS — I252 Old myocardial infarction: Secondary | ICD-10-CM

## 2019-01-29 DIAGNOSIS — Z885 Allergy status to narcotic agent status: Secondary | ICD-10-CM

## 2019-01-29 DIAGNOSIS — J9621 Acute and chronic respiratory failure with hypoxia: Secondary | ICD-10-CM | POA: Diagnosis present

## 2019-01-29 DIAGNOSIS — Y92009 Unspecified place in unspecified non-institutional (private) residence as the place of occurrence of the external cause: Secondary | ICD-10-CM

## 2019-01-29 DIAGNOSIS — I110 Hypertensive heart disease with heart failure: Principal | ICD-10-CM | POA: Diagnosis present

## 2019-01-29 DIAGNOSIS — Z8249 Family history of ischemic heart disease and other diseases of the circulatory system: Secondary | ICD-10-CM

## 2019-01-29 DIAGNOSIS — I248 Other forms of acute ischemic heart disease: Secondary | ICD-10-CM | POA: Diagnosis present

## 2019-01-29 DIAGNOSIS — R112 Nausea with vomiting, unspecified: Secondary | ICD-10-CM | POA: Diagnosis not present

## 2019-01-29 DIAGNOSIS — I5023 Acute on chronic systolic (congestive) heart failure: Secondary | ICD-10-CM | POA: Diagnosis present

## 2019-01-29 DIAGNOSIS — Z9114 Patient's other noncompliance with medication regimen: Secondary | ICD-10-CM

## 2019-01-29 DIAGNOSIS — R109 Unspecified abdominal pain: Secondary | ICD-10-CM

## 2019-01-29 DIAGNOSIS — Z20828 Contact with and (suspected) exposure to other viral communicable diseases: Secondary | ICD-10-CM | POA: Diagnosis present

## 2019-01-29 DIAGNOSIS — E860 Dehydration: Secondary | ICD-10-CM | POA: Diagnosis not present

## 2019-01-29 DIAGNOSIS — G4733 Obstructive sleep apnea (adult) (pediatric): Secondary | ICD-10-CM | POA: Diagnosis present

## 2019-01-29 DIAGNOSIS — N179 Acute kidney failure, unspecified: Secondary | ICD-10-CM | POA: Diagnosis not present

## 2019-01-29 LAB — COMPREHENSIVE METABOLIC PANEL
ALT: 19 U/L (ref 0–44)
AST: 28 U/L (ref 15–41)
Albumin: 3.6 g/dL (ref 3.5–5.0)
Alkaline Phosphatase: 128 U/L — ABNORMAL HIGH (ref 38–126)
Anion gap: 9 (ref 5–15)
BUN: 12 mg/dL (ref 6–20)
CO2: 23 mmol/L (ref 22–32)
Calcium: 8.6 mg/dL — ABNORMAL LOW (ref 8.9–10.3)
Chloride: 108 mmol/L (ref 98–111)
Creatinine, Ser: 1.13 mg/dL (ref 0.61–1.24)
GFR calc Af Amer: >60 mL/min (ref >60)
GFR calc non Af Amer: >60 mL/min (ref >60)
Glucose, Bld: 107 mg/dL — ABNORMAL HIGH (ref 70–99)
Potassium: 3.8 mmol/L (ref 3.5–5.1)
Sodium: 140 mmol/L (ref 135–145)
Total Bilirubin: 0.8 mg/dL (ref 0.3–1.2)
Total Protein: 7.8 g/dL (ref 6.5–8.1)

## 2019-01-29 LAB — CBC WITH DIFFERENTIAL/PLATELET
Abs Immature Granulocytes: 0.03 10*3/uL (ref 0.00–0.07)
Basophils Absolute: 0.1 10*3/uL (ref 0.0–0.1)
Basophils Relative: 1 %
Eosinophils Absolute: 0.1 10*3/uL (ref 0.0–0.5)
Eosinophils Relative: 1 %
HCT: 37.2 % — ABNORMAL LOW (ref 39.0–52.0)
Hemoglobin: 10.9 g/dL — ABNORMAL LOW (ref 13.0–17.0)
Immature Granulocytes: 0 %
Lymphocytes Relative: 24 %
Lymphs Abs: 2 10*3/uL (ref 0.7–4.0)
MCH: 20.3 pg — ABNORMAL LOW (ref 26.0–34.0)
MCHC: 29.3 g/dL — ABNORMAL LOW (ref 30.0–36.0)
MCV: 69.4 fL — ABNORMAL LOW (ref 80.0–100.0)
Monocytes Absolute: 0.8 10*3/uL (ref 0.1–1.0)
Monocytes Relative: 9 %
Neutro Abs: 5.6 10*3/uL (ref 1.7–7.7)
Neutrophils Relative %: 65 %
Platelets: 230 10*3/uL (ref 150–400)
RBC: 5.36 MIL/uL (ref 4.22–5.81)
RDW: 21 % — ABNORMAL HIGH (ref 11.5–15.5)
WBC: 8.6 10*3/uL (ref 4.0–10.5)
nRBC: 0.2 % (ref 0.0–0.2)

## 2019-01-29 LAB — BRAIN NATRIURETIC PEPTIDE: B Natriuretic Peptide: 643 pg/mL — ABNORMAL HIGH (ref 0.0–100.0)

## 2019-01-29 LAB — TROPONIN I
Troponin I: 0.05 ng/mL (ref <0.03)
Troponin I: 0.06 ng/mL (ref <0.03)

## 2019-01-29 LAB — SARS CORONAVIRUS 2 BY RT PCR (HOSPITAL ORDER, PERFORMED IN ~~LOC~~ HOSPITAL LAB): SARS Coronavirus 2: NEGATIVE

## 2019-01-29 MED ORDER — FENTANYL CITRATE (PF) 100 MCG/2ML IJ SOLN
100.0000 ug | Freq: Once | INTRAMUSCULAR | Status: AC
  Administered 2019-01-29: 100 ug via INTRAVENOUS
  Filled 2019-01-29: qty 2

## 2019-01-29 MED ORDER — ENOXAPARIN SODIUM 40 MG/0.4ML ~~LOC~~ SOLN: 40.0000 mg | SUBCUTANEOUS | Status: DC

## 2019-01-29 MED ORDER — FUROSEMIDE 10 MG/ML IJ SOLN
40.0000 mg | Freq: Two times a day (BID) | INTRAMUSCULAR | Status: DC
  Administered 2019-01-29 – 2019-01-30 (×2): 40 mg via INTRAVENOUS
  Filled 2019-01-29 (×3): qty 4

## 2019-01-29 MED ORDER — ACETAMINOPHEN 650 MG RE SUPP: 650.0000 mg | Freq: Four times a day (QID) | RECTAL | Status: DC | PRN

## 2019-01-29 MED ORDER — ONDANSETRON HCL 4 MG PO TABS
4.0000 mg | ORAL_TABLET | Freq: Four times a day (QID) | ORAL | Status: DC | PRN
  Administered 2019-02-01: 4 mg via ORAL
  Filled 2019-01-29: qty 1

## 2019-01-29 MED ORDER — IPRATROPIUM-ALBUTEROL 0.5-2.5 (3) MG/3ML IN SOLN: 3.0000 mL | Freq: Four times a day (QID) | RESPIRATORY_TRACT | Status: DC | PRN

## 2019-01-29 MED ORDER — LISINOPRIL 20 MG PO TABS
40.0000 mg | ORAL_TABLET | Freq: Every day | ORAL | Status: DC
  Administered 2019-01-29 – 2019-01-30 (×2): 40 mg via ORAL
  Filled 2019-01-29 (×2): qty 2

## 2019-01-29 MED ORDER — CARVEDILOL 25 MG PO TABS
25.0000 mg | ORAL_TABLET | Freq: Two times a day (BID) | ORAL | Status: DC
  Administered 2019-01-29 – 2019-01-30 (×3): 25 mg via ORAL
  Filled 2019-01-29 (×3): qty 1

## 2019-01-29 MED ORDER — FUROSEMIDE 10 MG/ML IJ SOLN
60.0000 mg | Freq: Once | INTRAMUSCULAR | Status: AC
  Administered 2019-01-29: 60 mg via INTRAVENOUS
  Filled 2019-01-29: qty 8

## 2019-01-29 MED ORDER — OXYCODONE HCL 5 MG PO TABS
5.0000 mg | ORAL_TABLET | Freq: Four times a day (QID) | ORAL | Status: DC | PRN
  Administered 2019-01-29 – 2019-01-30 (×3): 5 mg via ORAL
  Filled 2019-01-29 (×3): qty 1

## 2019-01-29 MED ORDER — ALBUTEROL SULFATE HFA 108 (90 BASE) MCG/ACT IN AERS
2.0000 | INHALATION_SPRAY | Freq: Four times a day (QID) | RESPIRATORY_TRACT | Status: DC | PRN
  Filled 2019-01-29: qty 6.7

## 2019-01-29 MED ORDER — ENOXAPARIN SODIUM 40 MG/0.4ML ~~LOC~~ SOLN
40.0000 mg | Freq: Two times a day (BID) | SUBCUTANEOUS | Status: DC
  Administered 2019-01-29 – 2019-02-03 (×7): 40 mg via SUBCUTANEOUS
  Filled 2019-01-29 (×8): qty 0.4

## 2019-01-29 MED ORDER — ORAL CARE MOUTH RINSE
15.0000 mL | Freq: Two times a day (BID) | OROMUCOSAL | Status: DC
  Administered 2019-01-30 – 2019-02-01 (×3): 15 mL via OROMUCOSAL

## 2019-01-29 MED ORDER — ONDANSETRON HCL 4 MG/2ML IJ SOLN
4.0000 mg | Freq: Four times a day (QID) | INTRAMUSCULAR | Status: DC | PRN
  Administered 2019-01-31 (×2): 4 mg via INTRAVENOUS
  Filled 2019-01-29 (×3): qty 2

## 2019-01-29 MED ORDER — ACETAMINOPHEN 325 MG PO TABS
650.0000 mg | ORAL_TABLET | Freq: Four times a day (QID) | ORAL | Status: DC | PRN
  Administered 2019-01-30 – 2019-01-31 (×3): 650 mg via ORAL
  Filled 2019-01-29 (×3): qty 2

## 2019-01-29 NOTE — ED Notes (Signed)
Date and time results received: 01/29/19 6:51 PM  (use smartphrase ".now" to insert current time)  Test: Troponin Critical Value: 0.05  Name of Provider Notified: Dr. Archie Balboa  Orders Received? Or Actions Taken?: Orders Received - See Orders for details

## 2019-01-29 NOTE — ED Notes (Signed)
Pt removed from bipap per order of Dr Verdell Carmine and placed on O2 at 6l/min via [place]. Pt instructed to call RN if notices difficulty with his breathing and is understanding.

## 2019-01-29 NOTE — ED Notes (Signed)
RT at bedside with bipap

## 2019-01-29 NOTE — H&P (Signed)
[place] [facility] [place] NAME: Marc [name]    MR#:  [id number]  DATE OF BIRTH:  [birth year]  DATE OF ADMISSION:  01/29/2019  PRIMARY CARE PHYSICIAN: Patient, No Pcp Per   REQUESTING/REFERRING PHYSICIAN: Dr. Nance Pear.   CHIEF COMPLAINT:   Chief Complaint  Patient presents with  . Respiratory Distress    HISTORY OF PRESENT ILLNESS:  Marc [name]  is a 43 y.o. male with a known history of chronic systolic CHF, severe cardiomyopathy EF of 25 to 30%, hypertension, obstructive sleep apnea, obesity who presents to the hospital due to shortness of breath.  Patient says he has been feeling short of breath over the past week progressively getting worse when he gets short of breath with just minimal exertion.  He also has noted that he has been filling up with fluid and his legs and ankles are quite swollen.  He has some paroxysmal nocturnal dyspnea and also orthopnea.  Due to his worsening edema, shortness of breath he came to the ER for further evaluation.  He was noted to be in acute respiratory failure with hypoxia secondary to underlying CHF and therefore hospitalist services were contacted for admission.  Denies any chest pains, nausea, vomiting, fever, chills, loss of taste, sick contacts.  Patient says he has been following social distancing and has not traveled anywhere recently.  PAST MEDICAL HISTORY:   Past Medical History:  Diagnosis Date  . CHF (congestive heart failure) ([place])   . Hypertension     PAST SURGICAL HISTORY:   Past Surgical History:  Procedure Laterality Date  . BICEPS TENDON REPAIR    . FOOT SURGERY     metataral and fasciotomy  . ROTATOR CUFF REPAIR      SOCIAL HISTORY:   Social History   Tobacco Use  . Smoking status: Current Every Day Smoker    Packs/day: 0.50    Years: 25.00    Pack years: 12.50    Types: Cigarettes  . Smokeless tobacco: Never Used  Substance Use Topics  . Alcohol use: Not  Currently    Frequency: Never    FAMILY HISTORY:   Family History  Problem Relation Age of Onset  . Stroke Mother   . Dementia Mother   . Hypertension Father   . Diabetes Father     DRUG ALLERGIES:   Allergies  Allergen Reactions  . Tramadol Other (See Comments)    Pt stated that it gave him sores.  . Amoxicillin Rash  . Zithromax [Azithromycin] Rash    REVIEW OF SYSTEMS:   Review of Systems  Constitutional: Negative for fever and weight loss.  HENT: Negative for congestion, nosebleeds and tinnitus.   Eyes: Negative for blurred vision, double vision and redness.  Respiratory: Positive for shortness of breath. Negative for cough and hemoptysis.   Cardiovascular: Positive for orthopnea, leg swelling and PND. Negative for chest pain.  Gastrointestinal: Negative for abdominal pain, diarrhea, melena, nausea and vomiting.  Genitourinary: Negative for dysuria, hematuria and urgency.  Musculoskeletal: Negative for falls and joint pain.  Neurological: Negative for dizziness, tingling, sensory change, focal weakness, seizures, weakness and headaches.  Endo/Heme/Allergies: Negative for polydipsia. Does not bruise/bleed easily.  Psychiatric/Behavioral: Negative for depression and memory loss. The patient is not nervous/anxious.     MEDICATIONS AT HOME:   Prior to Admission medications   Medication Sig Start Date End Date Taking? Authorizing Provider  albuterol (PROVENTIL HFA;VENTOLIN HFA) 108 (90 Base) MCG/ACT inhaler Inhale 2  puffs into the lungs every 6 (six) hours as needed for up to 30 days for wheezing or shortness of breath. 10/11/18 01/29/19 Yes Pyreddy, Reatha Harps, MD  carvedilol (COREG) 25 MG tablet Take 1 tablet (25 mg total) by mouth 2 (two) times daily. 10/11/18 10/11/19 Yes Pyreddy, Reatha Harps, MD  furosemide (LASIX) 40 MG tablet Take 1 tablet (40 mg total) by mouth 2 (two) times daily. 10/11/18 01/29/19 Yes Pyreddy, Reatha Harps, MD  lisinopril (PRINIVIL,ZESTRIL) 40 MG tablet Take 1 tablet  (40 mg total) by mouth daily for 30 days. 10/11/18 01/29/19 Yes Pyreddy, Reatha Harps, MD      VITAL SIGNS:  Blood pressure (!) 130/92, pulse (!) 107, temperature 98.3 F (36.8 C), temperature source Oral, resp. rate (!) 32, height 5\' 8"  (1.727 m), weight (!) 159.7 kg, SpO2 100 %.  PHYSICAL EXAMINATION:  Physical Exam  GENERAL:  43 y.o.-year-old obese patient lying in the bed in mild Resp. Distress.   EYES: Pupils equal, round, reactive to light and accommodation. No scleral icterus. Extraocular muscles intact.  HEENT: Head atraumatic, normocephalic. Oropharynx and nasopharynx clear. No oropharyngeal erythema, moist oral mucosa  NECK:  Supple, + jugular venous distention. No thyroid enlargement, no tenderness.  LUNGS: Normal breath sounds bilaterally, some wheezing diffusely, bibasilar rales, no rhonchi. No use of accessory muscles of respiration.  CARDIOVASCULAR: S1, S2 RRR. No murmurs, rubs, gallops, clicks.  ABDOMEN: Soft, nontender, nondistended. Bowel sounds present. No organomegaly or mass.  EXTREMITIES: +2 edema b/l, No cyanosis, or clubbing. + 2 pedal & radial pulses b/l.   NEUROLOGIC: Cranial nerves II through XII are intact. No focal Motor or sensory deficits appreciated b/l PSYCHIATRIC: The patient is alert and oriented x 3.  SKIN: No obvious rash, lesion, or ulcer.   LABORATORY PANEL:   CBC Recent Labs  Lab 01/29/19 1807  WBC 8.6  HGB 10.9*  HCT 37.2*  PLT 230   ------------------------------------------------------------------------------------------------------------------  Chemistries  Recent Labs  Lab 01/29/19 1807  NA 140  K 3.8  CL 108  CO2 23  GLUCOSE 107*  BUN 12  CREATININE 1.13  CALCIUM 8.6*  AST 28  ALT 19  ALKPHOS 128*  BILITOT 0.8   ------------------------------------------------------------------------------------------------------------------  Cardiac Enzymes Recent Labs  Lab 01/29/19 1807  TROPONINI 0.05*    ------------------------------------------------------------------------------------------------------------------  RADIOLOGY:  Dg Chest Portable 1 View  Result Date: 01/29/2019 CLINICAL DATA:  Shortness of breath EXAM: PORTABLE CHEST 1 VIEW COMPARISON:  10/09/2018 FINDINGS: Cardiomegaly with vascular congestion. No overt edema. No effusions or acute bony abnormality. IMPRESSION: Cardiomegaly, vascular congestion. Electronically Signed   By: Rolm Baptise M.D.   On: 01/29/2019 19:03     IMPRESSION AND PLAN:   43 year old male with past medical history of cardiomyopathy ejection fraction of 25 to 93%, chronic systolic CHF, hypertension, obesity, obstructive sleep apnea who presents to the hospital due to shortness of breath and lower extremity edema.  1.  Acute respiratory failure with hypoxia-secondary to CHF. - Initially patient presented to the ER was placed on BiPAP, wean him off BiPAP and place him on nasal cannula. -Wean oxygen as tolerated.  Continue CPAP at bedtime.  2.  CHF-acute on chronic systolic dysfunction.  Patient has been noncompliant and has not taken his medications over the past month due to insurance reasons.  - We will diurese the patient with IV Lasix, follow I's and O's and daily weights. -Continue carvedilol, lisinopril. -We will repeat echocardiogram, get a cardiology consult.  3.  Essential hypertension-continue carvedilol, lisinopril.  4.  Elevated troponin-likely  in the setting of supply demand ischemia from hypoxemia and CHF.  We will cycle his markers, observe on telemetry.  Check echocardiogram.  5.  Obstructive sleep apnea-continue CPAP.  All the records are reviewed and case discussed with ED provider. Management plans discussed with the patient, family and they are in agreement.  CODE STATUS: Full code  TOTAL TIME TAKING CARE OF THIS PATIENT: 45 minutes.    Henreitta Leber M.D on 01/29/2019 at 7:43 PM  Between 7am to 6pm - Pager - [phone]   After 6pm go to [email or web address] - password [email or web address] [facility]  Office  [phone]  CC: Primary care physician; Patient, No Pcp Per

## 2019-01-29 NOTE — Progress Notes (Signed)
Anticoagulation monitoring(Lovenox):  43yo  male ordered Lovenox 40 mg Q24h for DVT prevention  Filed Weights   01/29/19 1803  Weight: (!) 352 lb (159.7 kg)   BMI 53.5   Lab Results  Component Value Date   CREATININE 1.13 01/29/2019   CREATININE 1.16 10/11/2018   CREATININE 1.02 10/09/2018   Estimated Creatinine Clearance: 123.8 mL/min (by C-G formula based on SCr of 1.13 mg/dL). Hemoglobin & Hematocrit     Component Value Date/Time   HGB 10.9 (L) 01/29/2019 1807   HCT 37.2 (L) 01/29/2019 1807     Per Protocol for Patient with estCrcl > 30 ml/min and BMI > 40, will transition to Lovenox 40 mg Q12h.     Paulina Fusi, PharmD, BCPS 01/29/2019 9:55 PM

## 2019-01-29 NOTE — ED Provider Notes (Signed)
[facility] Emergency Department Provider Note  ____________________________________________   I have reviewed the triage vital signs and the nursing notes.   HISTORY  Chief Complaint Respiratory Distress   History limited by: Not Limited   HPI Marc [name] is a 43 y.o. male who presents to the emergency department today because of concerns for shortness of breath.  The patient does have history of CHF.  He states he has been out of his medications for the past month.  It sounds like this is due to insurance reasons.  Patient states that for the past number of days he has felt increasingly short of breath and has been increasing in his weight.  He has noticed swelling to his bilateral lower extremities and his stomach.  He does have some chest discomfort with this.  He denies any fevers.   Records reviewed. Per medical record review patient has a history of CHF, HTN.   Past Medical History:  Diagnosis Date  . CHF (congestive heart failure) ([place])   . Hypertension     Patient Active Problem List   Diagnosis Date Noted  . Chest pain 10/09/2018  . Non-STEMI (non-ST elevated myocardial infarction) ([place]) 10/09/2018    Past Surgical History:  Procedure Laterality Date  . BICEPS TENDON REPAIR    . FOOT SURGERY     metataral and fasciotomy  . ROTATOR CUFF REPAIR      Prior to Admission medications   Medication Sig Start Date End Date Taking? Authorizing Provider  albuterol (PROVENTIL HFA;VENTOLIN HFA) 108 (90 Base) MCG/ACT inhaler Inhale 2 puffs into the lungs every 6 (six) hours as needed for up to 30 days for wheezing or shortness of breath. 10/11/18 11/10/18  Saundra Shelling, MD  amiodarone (PACERONE) 400 MG tablet Take 1 tablet (400 mg total) by mouth daily for 30 days. 10/11/18 11/10/18  Saundra Shelling, MD  atorvastatin (LIPITOR) 40 MG tablet Take 1 tablet (40 mg total) by mouth daily at 6 PM for 30 days. 10/11/18 11/10/18  Saundra Shelling, MD  carvedilol  (COREG) 25 MG tablet Take 1 tablet (25 mg total) by mouth 2 (two) times daily. 10/11/18 10/11/19  Saundra Shelling, MD  furosemide (LASIX) 40 MG tablet Take 1 tablet (40 mg total) by mouth 2 (two) times daily. 10/11/18 12/07/18  Saundra Shelling, MD  lisinopril (PRINIVIL,ZESTRIL) 40 MG tablet Take 1 tablet (40 mg total) by mouth daily for 30 days. 10/11/18 11/10/18  Saundra Shelling, MD    Allergies Tramadol, Amoxicillin, and Zithromax [azithromycin]  Family History  Problem Relation Age of Onset  . Stroke Mother   . Dementia Mother     Social History Social History   Tobacco Use  . Smoking status: Current Every Day Smoker  . Smokeless tobacco: Never Used  Substance Use Topics  . Alcohol use: Not Currently    Frequency: Never  . Drug use: Not Currently    Review of Systems Constitutional: No fever/chills Eyes: No visual changes. ENT: No sore throat. Cardiovascular: Positive for chest pain. Respiratory: Positive for shortness of breath. Gastrointestinal: No abdominal pain.  No nausea, no vomiting.  No diarrhea.   Genitourinary: Negative for dysuria. Musculoskeletal: Positive for lower extremity swelling.  Skin: Negative for rash. Neurological: Negative for headaches, focal weakness or numbness.  ____________________________________________   PHYSICAL EXAM:  VITAL SIGNS: ED Triage Vitals  Enc Vitals Group     BP 01/29/19 1805 (!) 130/92     Pulse Rate 01/29/19 1805 (!) 108  Resp 01/29/19 1805 (!) 32     Temp 01/29/19 1805 98.3 F (36.8 C)     Temp Source 01/29/19 1805 Oral     SpO2 01/29/19 1805 100 %     Weight 01/29/19 1803 (!) 352 lb (159.7 kg)     Height 01/29/19 1802 _0  (1.727 m)     Head Circumference --      Peak Flow --      Pain Score 01/29/19 1802 8   Constitutional: Alert and oriented.  Eyes: Conjunctivae are normal.  ENT      Head: Normocephalic and atraumatic.      Nose: No congestion/rhinnorhea.      Mouth/Throat: Mucous membranes are moist.       Neck: No stridor. Hematological/Lymphatic/Immunilogical: No cervical lymphadenopathy. Cardiovascular: Normal rate, regular rhythm.  No murmurs, rubs, or gallops.  Respiratory: Diminished breath sounds. Some expiratory wheezing appreciated.  Gastrointestinal: Soft and non tender. No rebound. No guarding.  Genitourinary: Deferred Musculoskeletal: Normal range of motion in all extremities. Bilateral lower extremity edema.  Neurologic:  Normal speech and language. No gross focal neurologic deficits are appreciated.  Skin:  Skin is warm, dry and intact. No rash noted. Psychiatric: Mood and affect are normal. Speech and behavior are normal. Patient exhibits appropriate insight and judgment.  ____________________________________________    LABS (pertinent positives/negatives)  Trop 0.05 BNP 643 CBC wbc 8.6, hgb 10.9, plt 230 CMP wnl except glu 107, ca 8.6, alk phos 128  ____________________________________________   EKG  I, Nance Pear, attending physician, personally viewed and interpreted this EKG  EKG Time: 1803 Rate: 107 Rhythm: sinus tachycardia Axis: normal Intervals: qtc 473 QRS: narrow, q waves v1 ST changes: no st elevation Impression: abnormal ekg  ____________________________________________    RADIOLOGY  CXR Cardiomegaly. Vascular congestion  ____________________________________________   PROCEDURES  Procedures  ____________________________________________   INITIAL IMPRESSION / ASSESSMENT AND PLAN / ED COURSE  Pertinent labs & imaging results that were available during my care of the patient were reviewed by me and considered in my medical decision making (see chart for details).   Patient presented to the emergency department today with respiratory distress. Patient with history of CHF. Has been off of meds for roughly 1 month. Exam notable for increased respiratory effort, peripheral edema. Do have concern for fluid overload. Patient was placed  on bipap upon arrival. CXR without pna, ptx. Patient will be admitted to the hospital service for further work up and evaluation.   ____________________________________________   FINAL CLINICAL IMPRESSION(S) / ED DIAGNOSES  Final diagnoses:  Shortness of breath     Note: This dictation was prepared with Dragon dictation. Any transcriptional errors that result from this process are unintentional     Nance Pear, MD 01/29/19 2000

## 2019-01-29 NOTE — ED Triage Notes (Signed)
Pt arrives via ems from home in respiratory distress. Pt on cpap in route. Pt reports he "has been out of my medications for a month." Pt reports weight gain and increased work of breathing with exertion over the last few days.

## 2019-01-29 NOTE — ED Notes (Signed)
ED TO INPATIENT HANDOFF REPORT  ED Nurse Name and Phone #: Lelon Frohlich [phone]  S Name/Age/Gender Marc [name] 43 y.o. male Room/Bed: ED01A/ED01A  Code Status   Code Status: Prior  Home/SNF/Other Home Patient oriented to: self, place, time and situation Is this baseline? Yes   Triage Complete: Triage complete  Chief Complaint Respiratory Distress  Triage Note Pt arrives via ems from home in respiratory distress. Pt on cpap in route. Pt reports he "has been out of my medications for a month." Pt reports weight gain and increased work of breathing with exertion over the last few days.    Allergies Allergies  Allergen Reactions  . Tramadol Other (See Comments)    Pt stated that it gave him sores.  . Amoxicillin Rash  . Zithromax [Azithromycin] Rash    Level of Care/Admitting Diagnosis ED Disposition    ED Disposition Condition [facility] Area: [place] [[id number]]  Level of Care: Telemetry [5]  Covid Evaluation: Person Under Investigation (PUI)  Isolation Risk Level: Low Risk/Droplet (Less than 4L [place] supplementation)  Diagnosis: CHF (congestive heart failure) [facility]) [[id number]]  Admitting Physician: Henreitta Leber [[id number]]  Attending Physician: Henreitta Leber [[id number]]  Estimated length of stay: past midnight tomorrow  Certification:: I certify this patient will need inpatient services for at least 2 midnights  PT Class (Do Not Modify): Inpatient [101]  PT Acc Code (Do Not Modify): Private [1]       B Medical/Surgery History Past Medical History:  Diagnosis Date  . CHF (congestive heart failure) ([place])   . Hypertension    Past Surgical History:  Procedure Laterality Date  . BICEPS TENDON REPAIR    . FOOT SURGERY     metataral and fasciotomy  . ROTATOR CUFF REPAIR       A IV Location/Drains/Wounds Patient Lines/Drains/Airways Status   Active Line/Drains/Airways    Name:   Placement date:   Placement time:   Site:    Days:   Peripheral IV 01/29/19 Left Antecubital   01/29/19    1814    Antecubital   less than 1   Peripheral IV 01/29/19 Left Hand   01/29/19    1819    Hand   less than 1          Intake/Output Last 24 hours No intake or output data in the 24 hours ending 01/29/19 2046  Labs/Imaging Results for orders placed or performed during the hospital encounter of 01/29/19 (from the past 48 hour(s))  CBC with Differential     Status: Abnormal   Collection Time: 01/29/19  6:07 PM  Result Value Ref Range   WBC 8.6 4.0 - 10.5 K/uL   RBC 5.36 4.22 - 5.81 MIL/uL   Hemoglobin 10.9 (L) 13.0 - 17.0 g/dL   HCT 37.2 (L) 39.0 - 52.0 %   MCV 69.4 (L) 80.0 - 100.0 fL   MCH 20.3 (L) 26.0 - 34.0 pg   MCHC 29.3 (L) 30.0 - 36.0 g/dL   RDW 21.0 (H) 11.5 - 15.5 %   Platelets 230 150 - 400 K/uL   nRBC 0.2 0.0 - 0.2 %   Neutrophils Relative % 65 %   Neutro Abs 5.6 1.7 - 7.7 K/uL   Lymphocytes Relative 24 %   Lymphs Abs 2.0 0.7 - 4.0 K/uL   Monocytes Relative 9 %   Monocytes Absolute 0.8 0.1 - 1.0 K/uL   Eosinophils Relative 1 %   Eosinophils Absolute 0.1  0.0 - 0.5 K/uL   Basophils Relative 1 %   Basophils Absolute 0.1 0.0 - 0.1 K/uL   Immature Granulocytes 0 %   Abs Immature Granulocytes 0.03 0.00 - 0.07 K/uL    Comment: Performed at [facility], [address] [place], [place] [address]  Comprehensive metabolic panel     Status: Abnormal   Collection Time: 01/29/19  6:07 PM  Result Value Ref Range   Sodium 140 135 - 145 mmol/L   Potassium 3.8 3.5 - 5.1 mmol/L   Chloride 108 98 - 111 mmol/L   CO2 23 22 - 32 mmol/L   Glucose, Bld 107 (H) 70 - 99 mg/dL   BUN 12 6 - 20 mg/dL   Creatinine, Ser 1.13 0.61 - 1.24 mg/dL   Calcium 8.6 (L) 8.9 - 10.3 mg/dL   Total Protein 7.8 6.5 - 8.1 g/dL   Albumin 3.6 3.5 - 5.0 g/dL   AST 28 15 - 41 U/L   ALT 19 0 - 44 U/L   Alkaline Phosphatase 128 (H) 38 - 126 U/L   Total Bilirubin 0.8 0.3 - 1.2 mg/dL   GFR calc non Af Amer >60 >60 mL/min   GFR calc  Af Amer >60 >60 mL/min   Anion gap 9 5 - 15    Comment: Performed at [facility], [address] [place], [place] [address]  Troponin I - ONCE - STAT     Status: Abnormal   Collection Time: 01/29/19  6:07 PM  Result Value Ref Range   Troponin I 0.05 (HH) <0.03 ng/mL    Comment: CRITICAL RESULT CALLED TO, READ BACK BY AND VERIFIED WITH AMY SMITH AT 1851 ON 01/29/2019 JJB Performed at [facility], [address] [place], [place] [address]   Brain natriuretic peptide     Status: Abnormal   Collection Time: 01/29/19  6:07 PM  Result Value Ref Range   B Natriuretic Peptide 643.0 (H) 0.0 - 100.0 pg/mL    Comment: Performed at [facility], [address] [place],  [address]  SARS Coronavirus 2 (CEPHEID- Performed in [place] hospital lab), Hosp Order     Status: None   Collection Time: 01/29/19  6:07 PM   Specimen: Nasopharyngeal Swab  Result Value Ref Range   SARS Coronavirus 2 NEGATIVE NEGATIVE    Comment: (NOTE) If result is NEGATIVE SARS-CoV-2 target nucleic acids are NOT DETECTED. The SARS-CoV-2 RNA is generally detectable in upper and lower  respiratory specimens during the acute phase of infection. The lowest  concentration of SARS-CoV-2 viral copies this assay can detect is 250  copies / mL. A negative result does not preclude SARS-CoV-2 infection  and should not be used as the sole basis for treatment or other  patient management decisions.  A negative result may occur with  improper specimen collection / handling, submission of specimen other  than nasopharyngeal swab, presence of viral mutation(s) within the  areas targeted by this assay, and inadequate number of viral copies  (<250 copies / mL). A negative result must be combined with clinical  observations, patient history, and epidemiological information. If result is POSITIVE SARS-CoV-2 target nucleic acids are DETECTED. The SARS-CoV-2 RNA is generally detectable in upper and  lower  respiratory specimens dur ing the acute phase of infection.  Positive  results are indicative of active infection with SARS-CoV-2.  Clinical  correlation with patient history and other diagnostic information is  necessary to determine patient infection status.  Positive results do  not rule out bacterial infection or co-infection with other viruses. If result is PRESUMPTIVE POSTIVE SARS-CoV-2 nucleic acids MAY BE PRESENT.   A presumptive positive result was obtained on the submitted specimen  and confirmed on repeat testing.  While 2019 novel coronavirus  (SARS-CoV-2) nucleic acids may be present in the submitted sample  additional confirmatory testing may be necessary for epidemiological  and / or clinical management purposes  to differentiate between  SARS-CoV-2 and other Sarbecovirus currently known to infect humans.  If clinically indicated additional testing with an alternate test  methodology [phone]) is advised. The SARS-CoV-2 RNA is generally  detectable in upper and lower respiratory sp ecimens during the acute  phase of infection. The expected result is Negative. Fact Sheet for Patients:  StrictlyIdeas.no Fact Sheet for Healthcare Providers: BankingDealers.co.za This test is not yet approved or cleared by the [place] FDA and has been authorized for detection and/or diagnosis of SARS-CoV-2 by FDA under an Emergency Use Authorization (EUA).  This EUA will remain in effect (meaning this test can be used) for the duration of the COVID-19 declaration under Section 564(b)(1) of the Act, 21 U.S.C. section 360bbb-3(b)(1), unless the authorization is terminated or revoked sooner. Performed at [facility], [address] [place], [place] [address]    Dg Chest Portable 1 View  Result Date: 01/29/2019 CLINICAL DATA:  Shortness of breath EXAM: PORTABLE CHEST 1 VIEW COMPARISON:  10/09/2018 FINDINGS: Cardiomegaly  with vascular congestion. No overt edema. No effusions or acute bony abnormality. IMPRESSION: Cardiomegaly, vascular congestion. Electronically Signed   By: Rolm Baptise M.D.   On: 01/29/2019 19:03    Pending Labs Unresulted Labs (From admission, onward)    Start     Ordered   01/29/19 1944  Troponin I - Now Then Q4H  Now then every 4 hours,   STAT     01/29/19 1943   Signed and Held  HIV antibody (Routine Testing)  Once,   R     Signed and Held   Signed and Held  Basic metabolic panel  Tomorrow morning,   R     Signed and Held   Signed and Held  CBC  Tomorrow morning,   R     Signed and Held   Signed and Held  CBC  (enoxaparin (LOVENOX)    CrCl >/= 30 ml/min)  Once,   R    Comments: Baseline for enoxaparin therapy IF NOT ALREADY DRAWN.  Notify MD if PLT < 100 K.    Signed and Held   Signed and Held  Creatinine, serum  (enoxaparin (LOVENOX)    CrCl >/= 30 ml/min)  Once,   R    Comments: Baseline for enoxaparin therapy IF NOT ALREADY DRAWN.    Signed and Held   Signed and Held  Creatinine, serum  (enoxaparin (LOVENOX)    CrCl >/= 30 ml/min)  Weekly,   R    Comments: while on enoxaparin therapy    Signed and Held          Vitals/Pain Today's Vitals   01/29/19 1803 01/29/19 1805 01/29/19 1815 01/29/19 1832  BP:  (!) 130/92    Pulse:  (!) 108 (!) 107   Resp:  (!) 32    Temp:  98.3 F (36.8 C)    TempSrc:  Oral    SpO2:  100% 100%   Weight: (!) 159.7 kg     Height:      PainSc:  9     Isolation Precautions Droplet and Contact precautions  Medications Medications  ipratropium-albuterol (DUONEB) 0.5-2.5 (3) MG/3ML nebulizer solution 3 mL (has no administration in time range)  fentaNYL (SUBLIMAZE) injection 100 mcg (100 mcg Intravenous Given 01/29/19 1832)  furosemide (LASIX) injection 60 mg (60 mg Intravenous Given 01/29/19 1832)    Mobility walks Low fall risk   Focused Assessments na   R Recommendations: See Admitting Provider Note  Report given to:    Additional Notes: na

## 2019-01-30 LAB — BASIC METABOLIC PANEL
Anion gap: 7 (ref 5–15)
BUN: 14 mg/dL (ref 6–20)
CO2: 25 mmol/L (ref 22–32)
Calcium: 8.2 mg/dL — ABNORMAL LOW (ref 8.9–10.3)
Chloride: 108 mmol/L (ref 98–111)
Creatinine, Ser: 1.16 mg/dL (ref 0.61–1.24)
GFR calc Af Amer: >60 mL/min (ref >60)
GFR calc non Af Amer: >60 mL/min (ref >60)
Glucose, Bld: 93 mg/dL (ref 70–99)
Potassium: 3.6 mmol/L (ref 3.5–5.1)
Sodium: 140 mmol/L (ref 135–145)

## 2019-01-30 LAB — CBC
HCT: 35.5 % — ABNORMAL LOW (ref 39.0–52.0)
Hemoglobin: 10.3 g/dL — ABNORMAL LOW (ref 13.0–17.0)
MCH: 20.7 pg — ABNORMAL LOW (ref 26.0–34.0)
MCHC: 29 g/dL — ABNORMAL LOW (ref 30.0–36.0)
MCV: 71.3 fL — ABNORMAL LOW (ref 80.0–100.0)
Platelets: 197 10*3/uL (ref 150–400)
RBC: 4.98 MIL/uL (ref 4.22–5.81)
RDW: 21 % — ABNORMAL HIGH (ref 11.5–15.5)
WBC: 8.2 10*3/uL (ref 4.0–10.5)
nRBC: 0 % (ref 0.0–0.2)

## 2019-01-30 LAB — TROPONIN I
Troponin I: 0.05 ng/mL (ref <0.03)
Troponin I: 0.05 ng/mL (ref <0.03)

## 2019-01-30 MED ORDER — OXYCODONE HCL 5 MG PO TABS
5.0000 mg | ORAL_TABLET | Freq: Four times a day (QID) | ORAL | Status: DC | PRN
  Administered 2019-01-30 – 2019-01-31 (×2): 10 mg via ORAL
  Administered 2019-01-31: 5 mg via ORAL
  Administered 2019-01-31 – 2019-02-03 (×8): 10 mg via ORAL
  Filled 2019-01-30 (×11): qty 2

## 2019-01-30 MED ORDER — SENNA 8.6 MG PO TABS: 1.0000 | ORAL_TABLET | Freq: Every day | ORAL | Status: DC | PRN

## 2019-01-30 MED ORDER — NICOTINE 14 MG/24HR TD PT24
14.0000 mg | MEDICATED_PATCH | Freq: Every day | TRANSDERMAL | Status: DC
  Administered 2019-01-30 – 2019-02-03 (×5): 14 mg via TRANSDERMAL
  Filled 2019-01-30 (×5): qty 1

## 2019-01-30 MED ORDER — SODIUM CHLORIDE 0.9% FLUSH
3.0000 mL | Freq: Two times a day (BID) | INTRAVENOUS | Status: DC
  Administered 2019-01-30 – 2019-02-01 (×5): 3 mL via INTRAVENOUS

## 2019-01-30 MED ORDER — GUAIFENESIN 100 MG/5ML PO SOLN
5.0000 mL | ORAL | Status: DC | PRN
  Filled 2019-01-30: qty 5

## 2019-01-30 MED ORDER — SODIUM CHLORIDE 0.9% FLUSH
3.0000 mL | Freq: Two times a day (BID) | INTRAVENOUS | Status: DC
  Administered 2019-01-30 – 2019-02-03 (×6): 3 mL via INTRAVENOUS

## 2019-01-30 MED ORDER — BISACODYL 5 MG PO TBEC: 5.0000 mg | DELAYED_RELEASE_TABLET | Freq: Every day | ORAL | Status: DC | PRN

## 2019-01-30 NOTE — Progress Notes (Signed)
[facility] [place] NAME: Marc [name]    MR#:  [id number]  DATE OF BIRTH:  [birth year]  SUBJECTIVE:  CHIEF COMPLAINT:   Chief Complaint  Patient presents with   Respiratory Distress   The patient still has shortness of breath, cough with sputum and leg edema.  On oxygen 6 L by nasal cannula. REVIEW OF SYSTEMS:  Review of Systems  Constitutional: Positive for malaise/fatigue. Negative for chills and fever.  HENT: Negative for sore throat.   Eyes: Negative for blurred vision and double vision.  Respiratory: Positive for cough, sputum production and shortness of breath. Negative for hemoptysis, wheezing and stridor.   Cardiovascular: Positive for orthopnea and leg swelling. Negative for chest pain and palpitations.  Gastrointestinal: Negative for abdominal pain, blood in stool, diarrhea, melena, nausea and vomiting.  Genitourinary: Negative for dysuria, flank pain and hematuria.  Musculoskeletal: Negative for back pain and joint pain.  Skin: Negative for rash.  Neurological: Negative for dizziness, sensory change, focal weakness, seizures, loss of consciousness, weakness and headaches.  Endo/Heme/Allergies: Negative for polydipsia.  Psychiatric/Behavioral: Negative for depression. The patient is not nervous/anxious.     DRUG ALLERGIES:   Allergies  Allergen Reactions   Tramadol Other (See Comments)    Pt stated that it gave him sores.   Amoxicillin Rash   Zithromax [Azithromycin] Rash   VITALS:  Blood pressure 106/86, pulse 74, temperature 98.1 F (36.7 C), temperature source Oral, resp. rate 18, height 5\' 8"  (1.727 m), weight (!) 167.1 kg, SpO2 99 %. PHYSICAL EXAMINATION:  Physical Exam Constitutional:      General: He is not in acute distress.    Comments: Morbid obesity.  HENT:     Head: Normocephalic.     Mouth/Throat:     Mouth: Mucous membranes are moist.  Eyes:     General: No scleral icterus.  Conjunctiva/sclera: Conjunctivae normal.     Pupils: Pupils are equal, round, and reactive to light.  Neck:     Musculoskeletal: Normal range of motion and neck supple.     Vascular: No JVD.     Trachea: No tracheal deviation.  Cardiovascular:     Rate and Rhythm: Normal rate and regular rhythm.     Heart sounds: Normal heart sounds. No murmur. No gallop.   Pulmonary:     Effort: Pulmonary effort is normal. No respiratory distress.     Breath sounds: Rales present. No wheezing.  Abdominal:     General: Bowel sounds are normal. There is no distension.     Palpations: Abdomen is soft.     Tenderness: There is no abdominal tenderness. There is no rebound.  Musculoskeletal: Normal range of motion.        General: No tenderness.     Right lower leg: Edema present.     Left lower leg: Edema present.  Skin:    Findings: No erythema or rash.  Neurological:     General: No focal deficit present.     Mental Status: He is alert and oriented to person, place, and time.     Cranial Nerves: No cranial nerve deficit.  Psychiatric:        Mood and Affect: Mood normal.    LABORATORY PANEL:  Male CBC Recent Labs  Lab 01/30/19 0626  WBC 8.2  HGB 10.3*  HCT 35.5*  PLT 197   ------------------------------------------------------------------------------------------------------------------ Chemistries  Recent Labs  Lab 01/29/19 1807 01/30/19 0626  NA 140 140  K  3.8 3.6  CL 108 108  CO2 23 25  GLUCOSE 107* 93  BUN 12 14  CREATININE 1.13 1.16  CALCIUM 8.6* 8.2*  AST 28  --   ALT 19  --   ALKPHOS 128*  --   BILITOT 0.8  --    RADIOLOGY:  Dg Chest Portable 1 View  Result Date: 01/29/2019 CLINICAL DATA:  Shortness of breath EXAM: PORTABLE CHEST 1 VIEW COMPARISON:  10/09/2018 FINDINGS: Cardiomegaly with vascular congestion. No overt edema. No effusions or acute bony abnormality. IMPRESSION: Cardiomegaly, vascular congestion. Electronically Signed   By: Rolm Baptise M.D.   On:  01/29/2019 19:03   ASSESSMENT AND PLAN:   43 year old male with past medical history of cardiomyopathy ejection fraction of 25 to 86%, chronic systolic CHF, hypertension, obesity, obstructive sleep apnea who presents to the hospital due to shortness of breath and lower extremity edema.  1.  Acute respiratory failure with hypoxia-secondary to CHF, .ejection fraction of 20-25% per recent echo - Initially patient presented to the ER was placed on BiPAP, wean him off BiPAP and place him on nasal cannula. Try to wean oxygen as tolerated.  Continue CPAP at bedtime.  2.  CHF-acute on chronic systolic dysfunction.  Patient has been noncompliant and has not taken his medications over the past month due to insurance reasons.  Continue IV Lasix, follow I's and O's and daily weights. -Continue carvedilol, lisinopril, follow-up cardiology consult.  3.  Essential hypertension-continue carvedilol, lisinopril.  4.  Elevated troponin-likely in the setting of supply demand ischemia from hypoxemia and CHF.   5.  Obstructive sleep apnea-continue CPAP.  Tobacco abuse.  Smoking cessation was counseled for 3 to 4 minutes, nicotine patch.  All the records are reviewed and case discussed with Care Management/Social Worker. Management plans discussed with the patient, family and they are in agreement.  CODE STATUS: Full Code  TOTAL TIME TAKING CARE OF THIS PATIENT: 38 minutes.   More than 50% of the time was spent in counseling/coordination of care: YES  POSSIBLE D/C IN 3 DAYS, DEPENDING ON CLINICAL CONDITION.   Demetrios Loll M.D on 01/30/2019 at 1:50 PM  Between 7am to 6pm - Pager - [phone]  After 6pm go to [email or web address] - [facility]

## 2019-01-30 NOTE — Progress Notes (Signed)
Pt complaining of constant pain, will let MD know.

## 2019-01-30 NOTE — TOC Initial Note (Signed)
Transition of Care [facility]) - Initial/Assessment Note    Patient Details  Name: Marc [name] MRN: [id number] Date of Birth: 12-Dec-1975  [facility] CM/SW Contact:    Elza Rafter, RN Phone Number: 01/30/2019, 4:14 PM  Clinical Narrative:      Patient is from home alone.  He recently moved to [place].  He has been admitted with acute on chronic CHF.  Currently on 2L oxygen.  He does not have a PCP or insurance at this time.  Finacial counseling has been in contact with him to assist with disability and Medicaid forms.  He is receiving IV lasix BID.  Has a functioning scale at home and weighs on a regular basis.  Will have a virtual heart failure clinic appointment scheduled prior to DC.  Referral to [facility]-provided patient with [facility] and [facility] applications.  Will continue to follow for medication needs.                Expected Discharge Plan: Home/Self Care Barriers to Discharge: Continued Medical Work up   Patient Goals and CMS Choice        Expected Discharge Plan and Services Expected Discharge Plan: Home/Self Care   Discharge Planning Services: CM Consult, [facility], Medication Assistance   Living arrangements for the past 2 months: Single Family Home                                      Prior Living Arrangements/Services Living arrangements for the past 2 months: Single Family Home Lives with:: Self Patient language and need for interpreter reviewed:: Yes Do you feel safe going back to the place where you live?: Yes            Criminal Activity/Legal Involvement Pertinent to Current Situation/Hospitalization: No - Comment as needed  Activities of Daily Living Home Assistive Devices/Equipment: Eyeglasses, CPAP ADL Screening (condition at time of admission) Patient's cognitive ability adequate to safely complete daily activities?: Yes Is the patient deaf or have difficulty hearing?: No Does the patient have  difficulty seeing, even when wearing glasses/contacts?: No Does the patient have difficulty concentrating, remembering, or making decisions?: No Patient able to express need for assistance with ADLs?: Yes Does the patient have difficulty dressing or bathing?: No Independently performs ADLs?: Yes (appropriate for developmental age) Does the patient have difficulty walking or climbing stairs?: Yes Weakness of Legs: None Weakness of Arms/Hands: None  Permission Sought/Granted Permission sought to share information with : Facility Art therapist granted to share information with : Yes, Verbal Permission Granted     Permission granted to share info w AGENCY: [facility] and Medications Management clinic.        Emotional Assessment Appearance:: Appears stated age Attitude/Demeanor/Rapport: Gracious Affect (typically observed): Accepting Orientation: : Oriented to Self, Oriented to Place, Oriented to  Time, Oriented to Situation Alcohol / Substance Use: Not Applicable    Admission diagnosis:  Shortness of breath [R06.02] Patient Active Problem List   Diagnosis Date Noted  . CHF (congestive heart failure) ([place]) 01/29/2019  . Chest pain 10/09/2018  . Non-STEMI (non-ST elevated myocardial infarction) ([place]) 10/09/2018   PCP:  Patient, No Pcp Per Pharmacy:   [id number] [place], [address] [place] [address] Phone: [phone] Fax: [phone]     Social Determinants of Health (SDOH) Interventions  Readmission Risk Interventions No flowsheet data found.

## 2019-01-30 NOTE — Progress Notes (Addendum)
Cardiac Rehab Navigator/ Exercise Physiologist Note  CHF Education:??  Educational session with patient completed.  Provided patient with "Living Better with Heart Failure" packet. Briefly reviewed definition of heart failure and signs and symptoms of an exacerbation. Discussed potential causes of CHF.  Explained to patient that HF is a chronic illness which requires self-assessment / self-management along with help from the cardiologist/PCP. Discussed definition of EF measurement along with normal value compared to patient's EF 25-30%. ? *Reviewed importance of and reason behind checking weight daily in the AM, after using the bathroom, but before getting dressed. Patient does have functioning scale. Patient reports weighing daily, does not record. Patient will begin recording weights daily.   *Reviewed with patient the following information: *Discussed when to call the Dr= weight gain of >2-3lb overnight of 5lb in a week,  *Discussed yellow zone= call MD: weight gain of >2-3lb overnight of 5lb in a week, increased swelling, increased SOB when lying down, chest discomfort, dizziness, increased fatigue *Red Zone= call 911: struggle to breath, fainting or near fainting, significant chest pain ?  *Heart Failure Zone Magnet given and reviewed with patient.   ? *Diet - Patient currently ordered heart healthy.  Referral for Dietitian Consultation for diet education has been ordered. Instructed patient to follow a low sodium diet of 2000 mg or less.  Recommended foods for low sodium heart healthy nutrition or heart failure carb modified nutrition therapy discussed. Reviewed with patient steps to reading a food label with close attention to serving size and mg of sodium.   ? *Discussed fluid intake with patient as well. Patient on fluid restriction currently on 1200 ml fluid restriction.  Demonstrated this volume to patient using the bedside water pitcher.   ? *Instructed patient to take medications as  prescribed for heart failure. Explained briefly why patient is on the medications (either make you feel better, live longer or keep you out of the hospital) and discussed monitoring and side effects.  ? *Discussed exercise / activity. Patient is sedentary. Patient is short of breath moving around in the bed. Patient reports walking to the mailbox once every 3 days. Patient finds it difficult to get around the house. Patient is not a candidate for Cardiac or Pulmonary Rehab right now due to deconditioning, shortness of breath at rest, and does not want to participate because of insurance issues.  ? *Smoking Cessation- Patient is a CURRENT everyday smoker. "Thinking about Quitting - Yes, You Can" handout given and reviewed with patient.  Discussed free Telephonic Quit Smart program here at [facility].   ? *[facility] Failure [facility] - Explained the purpose of the HF Clinic. Explained to patient the HF Clinic does not replace PCP nor Cardiologist, but is an additional resource to helping patient manage heart failure at home. Patient has an appointment scheduled on 02/16/19 at 1:20pm.   Patient reports having insurance issues. Patient reports sending in paperwork and the paperwork being lost. Patient reports high stress and low social support at home. ? Again, the 5 Steps to Living Better with Heart Failure were reviewed with patient.   This EP played EMMI videos on the topics Heart Failure, What Is It?, Daily Weights, Smoking and Alcohol, and Rest and Exercise for patient. Patient paid attention and learned facts that he had not heard before from the videos.  Patient thanked me for providing the above information. ?  [name], BS [facility]  Exercise Physiologist Department Phone #: [phone] Fax:  [place] Email Address: [email or web address]

## 2019-01-30 NOTE — Plan of Care (Signed)
  Problem: Education: Goal: Knowledge of General Education information will improve Description: Including pain rating scale, medication(s)/side effects and non-pharmacologic comfort measures Outcome: Progressing   Problem: Health Behavior/Discharge Planning: Goal: Ability to manage health-related needs will improve Outcome: Progressing   Problem: Clinical Measurements: Goal: Ability to maintain clinical measurements within normal limits will improve Outcome: Progressing Goal: Will remain free from infection Outcome: Progressing Note: Remains afebrile Goal: Diagnostic test results will improve Outcome: Progressing Note: BNP on admission 643 Goal: Respiratory complications will improve Outcome: Progressing Note: Weaned pt from 6 liters O2 to 4L this morning   Problem: Education: Goal: Ability to verbalize understanding of medication therapies will improve Outcome: Progressing

## 2019-01-30 NOTE — Progress Notes (Signed)
Advanced Care Plan.  Purpose of Encounter: CODE STATUS.  Parties in Attendance: The patient and me. Patient's Decisional Capacity: Yes. Medical Story: Marc [name]  is a 43 y.o. male with a known history of chronic systolic CHF, severe cardiomyopathy EF of 25 to 30%, hypertension, obstructive sleep apnea, obesity is admitted for acute respiratory failure with hypoxia due to acute on chronic systolic CHF.  He is put on oxygen by nasal cannula 6 L.  I discussed with the patient about his current condition, poor prognosis and CODE STATUS.  He has high risk for cardiopulmonary arrest.  The patient does want to be resuscitated and intubated if he has cardiopulmonary arrest. Plan:  Code Status: Full code. Time spent discussing advance care planning: 17 minutes.

## 2019-01-30 NOTE — Progress Notes (Signed)
Page out to Dr Bridgett Larsson re: bp 85/61 and patients request for more frequent pain medications.  Orders received for pain medication only.

## 2019-01-30 NOTE — Progress Notes (Signed)
Pt BP 101/86. MD made aware, Lung sounds diminished but clear. Lasix held for now, will continue to monitor

## 2019-01-31 LAB — BASIC METABOLIC PANEL
Anion gap: 9 (ref 5–15)
BUN: 25 mg/dL — ABNORMAL HIGH (ref 6–20)
CO2: 21 mmol/L — ABNORMAL LOW (ref 22–32)
Calcium: 8.2 mg/dL — ABNORMAL LOW (ref 8.9–10.3)
Chloride: 107 mmol/L (ref 98–111)
Creatinine, Ser: 1.53 mg/dL — ABNORMAL HIGH (ref 0.61–1.24)
GFR calc Af Amer: >60 mL/min (ref >60)
GFR calc non Af Amer: 54 mL/min — ABNORMAL LOW (ref >60)
Glucose, Bld: 111 mg/dL — ABNORMAL HIGH (ref 70–99)
Potassium: 4.4 mmol/L (ref 3.5–5.1)
Sodium: 137 mmol/L (ref 135–145)

## 2019-01-31 LAB — HEMOGLOBIN A1C
Hgb A1c MFr Bld: 5.6 % (ref 4.8–5.6)
Mean Plasma Glucose: 114.02 mg/dL

## 2019-01-31 LAB — MAGNESIUM: Magnesium: 2.1 mg/dL (ref 1.7–2.4)

## 2019-01-31 LAB — HIV ANTIBODY (ROUTINE TESTING W REFLEX): HIV Screen 4th Generation wRfx: NONREACTIVE

## 2019-01-31 MED ORDER — ENSURE ENLIVE PO LIQD
237.0000 mL | Freq: Three times a day (TID) | ORAL | Status: DC
  Administered 2019-01-31 – 2019-02-03 (×6): 237 mL via ORAL

## 2019-01-31 MED ORDER — FUROSEMIDE 10 MG/ML IJ SOLN
20.0000 mg | Freq: Two times a day (BID) | INTRAMUSCULAR | Status: DC
  Administered 2019-01-31 – 2019-02-03 (×5): 20 mg via INTRAVENOUS
  Filled 2019-01-31 (×5): qty 2

## 2019-01-31 MED ORDER — PHENOL 1.4 % MT LIQD
2.0000 | OROMUCOSAL | Status: DC | PRN
  Administered 2019-01-31: 2 via OROMUCOSAL
  Filled 2019-01-31: qty 177

## 2019-01-31 MED ORDER — ADULT MULTIVITAMIN W/MINERALS CH
1.0000 | ORAL_TABLET | Freq: Every day | ORAL | Status: DC
  Administered 2019-02-01 – 2019-02-03 (×3): 1 via ORAL
  Filled 2019-01-31 (×3): qty 1

## 2019-01-31 MED ORDER — CARVEDILOL 12.5 MG PO TABS
12.5000 mg | ORAL_TABLET | Freq: Two times a day (BID) | ORAL | Status: DC
  Administered 2019-01-31 – 2019-02-01 (×2): 12.5 mg via ORAL
  Filled 2019-01-31 (×3): qty 1

## 2019-01-31 NOTE — Plan of Care (Signed)
Nutrition Education Note  RD consulted for nutrition education regarding CHF.  Spoke with pt via phone  RD provided "Low Sodium Nutrition Therapy" handout from the [employer] via mail. Reviewed patient's dietary recall. Provided examples on ways to decrease sodium intake in diet. Discouraged intake of processed foods and use of salt shaker. Encouraged fresh fruits and vegetables as well as whole grain sources of carbohydrates to maximize fiber intake.   RD discussed why it is important for patient to adhere to diet recommendations, and emphasized the role of fluids, foods to avoid, and importance of weighing self daily. Teach back method used.  Expect poor compliance.  Body mass index is 56.61 kg/m. Pt meets criteria for morbid obesity based on current BMI.  RD following this pt  Koleen Distance MS, RD, LDN Pager #[phone] Office#- [phone] After Hours Pager: [phone]

## 2019-01-31 NOTE — Progress Notes (Signed)
Initial Nutrition Assessment  DOCUMENTATION CODES:   Morbid obesity  INTERVENTION:   Ensure Enlive po TID, each supplement provides 350 kcal and 20 grams of protein  MVI daily   Recommend check iron/anemia labs   Low sodium diet edcuation  NUTRITION DIAGNOSIS:   Increased nutrient needs related to other (see comment)(morbid obesity, CHF) as evidenced by increased estimated needs.  GOAL:   Patient will meet greater than or equal to 90% of their needs  MONITOR:   PO intake, Supplement acceptance, Labs, Weight trends, Skin, I & O's  REASON FOR ASSESSMENT:   Consult Diet education  ASSESSMENT:   43 y.o. male with a known history of chronic systolic CHF, severe cardiomyopathy EF of 25 to 30%, hypertension, obstructive sleep apnea, obesity is admitted for acute respiratory failure with hypoxia due to acute on chronic systolic CHF.  RD working remotely.  Spoke with pt via phone. Pt reports good appetite and oral intake pta. Pt reports abdominal pain and nausea for several days pta. Pt reports eating 100% of meals yesterday but states that he vomited up a meal yesterday and his throat was sore today. Pt reports that he did not eat breakfast today because he is having an ultrasound later. RD will add supplements and MVI to help pt meet his estimated needs; pt would like chocolate Ensure. Per chart, pt with weight gain pta r/t edema. Pt provided with low sodium diet education today.   Of note, pt with microcytic anemia; would recommend check iron, ferritin, transferrin, folate, TIBC and B12 labs to r/o deficiency.   Medications reviewed and include: lovenox, lasix, nicotine  Labs reviewed: BUN 25(H), creat 1.53(H) Hgb 10.3(L), Hct 35.5(L), MCV 71.3(L), MCH 20.7(L), MCHC 29.0(L)  Unable to complete Nutrition-Focused physical exam at this time.   Diet Order:   Diet Order            Diet Heart Room service appropriate? Yes; Fluid consistency: Thin; Fluid restriction: 1200 mL  Fluid  Diet effective now             EDUCATION NEEDS:   Education needs have been addressed  Skin:  Skin Assessment: Reviewed RN Assessment  Last BM:  6/14  Height:   Ht Readings from Last 1 Encounters:  01/29/19 5\' 8"  (1.727 m)    Weight:   Wt Readings from Last 1 Encounters:  01/31/19 (!) 168.9 kg    Ideal Body Weight:  70 kg  BMI:  Body mass index is 56.61 kg/m.  Estimated Nutritional Needs:   Kcal:  3000-3300kcal/day  Protein:  >135g/day  Fluid:  >2.1L/day  Koleen Distance MS, RD, LDN Pager #- [phone] Office#- [phone] After Hours Pager: [phone]

## 2019-01-31 NOTE — Plan of Care (Signed)
Pt compliant with medications. Ambulated to restroom this shift, but refused to ambulate out of room stating he was in pain. Oxy 10 mg administered x 1 for abdominal pain with positive results. Pt educated about being NPO after midnight. Voiced understanding. Will continue to monitor.   Problem: Health Behavior/Discharge Planning: Goal: Ability to manage health-related needs will improve Outcome: Progressing   Problem: Clinical Measurements: Goal: Ability to maintain clinical measurements within normal limits will improve Outcome: Progressing Goal: Will remain free from infection Outcome: Progressing Goal: Respiratory complications will improve Outcome: Progressing Goal: Cardiovascular complication will be avoided Outcome: Progressing   Problem: Activity: Goal: Risk for activity intolerance will decrease Outcome: Progressing   Problem: Coping: Goal: Level of anxiety will decrease Outcome: Progressing   Problem: Pain Managment: Goal: General experience of comfort will improve Outcome: Progressing

## 2019-01-31 NOTE — Progress Notes (Signed)
CCMD reports 8 beat run v-tach. Pt has no new c/o chest pain or SOB. MD Marcille Blanco made aware. No new orders

## 2019-01-31 NOTE — Progress Notes (Signed)
[facility] [place] NAME: Marc [name]    MR#:  [id number]  DATE OF BIRTH:  May 01, 1976  SUBJECTIVE:  CHIEF COMPLAINT:   Chief Complaint  Patient presents with  . Respiratory Distress   The patient has better shortness of breath, cough with sputum and leg edema.  On oxygen 2 L by nasal cannula.  The patient also complains of abdominal pain and distention with nausea and vomiting this morning.  Blood pressure is soft. REVIEW OF SYSTEMS:  Review of Systems  Constitutional: Positive for malaise/fatigue. Negative for chills and fever.  HENT: Negative for sore throat.   Eyes: Negative for blurred vision and double vision.  Respiratory: Positive for cough, sputum production and shortness of breath. Negative for hemoptysis, wheezing and stridor.   Cardiovascular: Positive for orthopnea and leg swelling. Negative for chest pain and palpitations.  Gastrointestinal: Positive for abdominal pain, nausea and vomiting. Negative for blood in stool, diarrhea and melena.  Genitourinary: Negative for dysuria, flank pain and hematuria.  Musculoskeletal: Negative for back pain and joint pain.  Skin: Negative for rash.  Neurological: Negative for dizziness, sensory change, focal weakness, seizures, loss of consciousness, weakness and headaches.  Endo/Heme/Allergies: Negative for polydipsia.  Psychiatric/Behavioral: Negative for depression. The patient is not nervous/anxious.     DRUG ALLERGIES:   Allergies  Allergen Reactions  . Tramadol Other (See Comments)    Pt stated that it gave him sores.  . Amoxicillin Rash  . Zithromax [Azithromycin] Rash   VITALS:  Blood pressure 91/65, pulse 66, temperature 98.6 F (37 C), temperature source Oral, resp. rate 18, height 5\' 8"  (1.727 m), weight (!) 168.9 kg, SpO2 99 %. PHYSICAL EXAMINATION:  Physical Exam Constitutional:      General: He is not in acute distress.    Comments: Morbid obesity.  HENT:     Head: Normocephalic.     Mouth/Throat:     Mouth: Mucous membranes are moist.  Eyes:     General: No scleral icterus.    Conjunctiva/sclera: Conjunctivae normal.     Pupils: Pupils are equal, round, and reactive to light.  Neck:     Musculoskeletal: Normal range of motion and neck supple.     Vascular: No JVD.     Trachea: No tracheal deviation.  Cardiovascular:     Rate and Rhythm: Normal rate and regular rhythm.     Heart sounds: Normal heart sounds. No murmur. No gallop.   Pulmonary:     Effort: Pulmonary effort is normal. No respiratory distress.     Breath sounds: No stridor. Rales present. No wheezing or rhonchi.  Abdominal:     General: Bowel sounds are normal. There is distension.     Palpations: Abdomen is soft.     Tenderness: There is abdominal tenderness. There is no rebound.  Musculoskeletal: Normal range of motion.        General: No tenderness.     Right lower leg: Edema present.     Left lower leg: Edema present.  Skin:    Findings: No erythema or rash.  Neurological:     General: No focal deficit present.     Mental Status: He is alert and oriented to person, place, and time.     Cranial Nerves: No cranial nerve deficit.  Psychiatric:        Mood and Affect: Mood normal.    LABORATORY PANEL:  Male CBC Recent Labs  Lab 01/30/19 0626  WBC 8.2  HGB 10.3*  HCT 35.5*  PLT 197   ------------------------------------------------------------------------------------------------------------------ Chemistries  Recent Labs  Lab 01/29/19 1807  01/31/19 0312  NA 140   < > 137  K 3.8   < > 4.4  CL 108   < > 107  CO2 23   < > 21*  GLUCOSE 107*   < > 111*  BUN 12   < > 25*  CREATININE 1.13   < > 1.53*  CALCIUM 8.6*   < > 8.2*  MG  --   --  2.1  AST 28  --   --   ALT 19  --   --   ALKPHOS 128*  --   --   BILITOT 0.8  --   --    < > = values in this interval not displayed.   RADIOLOGY:  No results found. ASSESSMENT AND PLAN:   43 year old male  with past medical history of cardiomyopathy ejection fraction of 25 to 88%, chronic systolic CHF, hypertension, obesity, obstructive sleep apnea who presents to the hospital due to shortness of breath and lower extremity edema.  1.  Acute respiratory failure with hypoxia-secondary to CHF, .ejection fraction of 20-25% per recent echo - Initially patient presented to the ER was placed on BiPAP, wean him off BiPAP and place him on nasal cannula. Try to wean off oxygen as tolerated.  Continue CPAP at bedtime.  2.  CHF-acute on chronic systolic dysfunction.  Patient has been noncompliant and has not taken his medications over the past month due to insurance reasons.  Decrease the dose of IV Lasix, follow I's and O's and daily weights. Hold carvedilol, lisinopril due to softer blood pressure.  3.  Essential hypertension- hold carvedilol, lisinopril.  4.  Elevated troponin-likely in the setting of supply demand ischemia from hypoxemia and CHF.   5.  Obstructive sleep apnea-continue CPAP.  Tobacco abuse.  Smoking cessation was counseled for 3 to 4 minutes, nicotine patch.  Acute renal failure with dehydration due to diuretics.  Decrease Lasix dose.  Follow-up BMP.  Abdominal pain and distention with nausea and vomiting. Abdominal ultrasound.  All the records are reviewed and case discussed with Care Management/Social Worker. Management plans discussed with the patient, his sister and they are in agreement.  CODE STATUS: Full Code  TOTAL TIME TAKING CARE OF THIS PATIENT: 33 minutes.   More than 50% of the time was spent in counseling/coordination of care: YES  POSSIBLE D/C IN 2-3 DAYS, DEPENDING ON CLINICAL CONDITION.   Demetrios Loll M.D on 01/31/2019 at 1:02 PM  Between 7am to 6pm - Pager - [phone]  After 6pm go to [email or web address] - [facility]

## 2019-02-01 ENCOUNTER — Inpatient Hospital Stay: Payer: Self-pay

## 2019-02-01 LAB — BASIC METABOLIC PANEL
Anion gap: 9 (ref 5–15)
BUN: 34 mg/dL — ABNORMAL HIGH (ref 6–20)
CO2: 25 mmol/L (ref 22–32)
Calcium: 8.3 mg/dL — ABNORMAL LOW (ref 8.9–10.3)
Chloride: 104 mmol/L (ref 98–111)
Creatinine, Ser: 1.51 mg/dL — ABNORMAL HIGH (ref 0.61–1.24)
GFR calc Af Amer: >60 mL/min (ref >60)
GFR calc non Af Amer: 55 mL/min — ABNORMAL LOW (ref >60)
Glucose, Bld: 98 mg/dL (ref 70–99)
Potassium: 4 mmol/L (ref 3.5–5.1)
Sodium: 138 mmol/L (ref 135–145)

## 2019-02-01 LAB — LIPID PANEL
Cholesterol: 80 mg/dL (ref 0–200)
HDL: 25 mg/dL — ABNORMAL LOW (ref >40)
LDL Cholesterol: 40 mg/dL (ref 0–99)
Total CHOL/HDL Ratio: 3.2 RATIO
Triglycerides: 73 mg/dL (ref <150)
VLDL: 15 mg/dL (ref 0–40)

## 2019-02-01 NOTE — Consult Note (Signed)
Reason for Consult: Respiratory failure shortness of breath congestive heart failure Referring Physician: [name] hospitalist  Marc [name] is an 43 y.o. [name].  HPI: Marc [name] presents with shortness of breath dyspnea Marc [name] has had recurrent admissions to [facility] because of recurrent symptoms of dyspnea shortness of breath obstructive sleep apnea leg edema.  Marc [name] has been noncompliant with medical therapy he says he is unable to afford his medicine so he has not taken many of his hypertensive diuretic and heart failure medicines.  Marc [name] complained of chest pain shortness of breath and dyspnea so he was admitted for further assessment evaluation  Past Medical History:  Diagnosis Date  . CHF (congestive heart failure) ([place])   . Hypertension     Past Surgical History:  Procedure Laterality Date  . BICEPS TENDON REPAIR    . FOOT SURGERY     metataral and fasciotomy  . ROTATOR CUFF REPAIR      Family History  Problem Relation Age of Onset  . Stroke Mother   . Dementia Mother   . Hypertension Father   . Diabetes Father     Social History:  reports that he has been smoking cigarettes. He has a 12.50 pack-year smoking history. He has never used smokeless tobacco. He reports previous alcohol use. He reports previous drug use.  Allergies:  Allergies  Allergen Reactions  . Tramadol Other (See Comments)    Pt stated that it gave him sores.  . Amoxicillin Rash  . Zithromax [Azithromycin] Rash    Medications: I have reviewed the Marc [name]'s current medications.  Results for orders placed or performed during the hospital encounter of 01/29/19 (from the past 48 hour(s))  Basic metabolic panel     Status: Abnormal   Collection Time: 01/31/19  3:12 AM  Result Value Ref Range   Sodium 137 135 - 145 mmol/L   Potassium 4.4 3.5 - 5.1 mmol/L   Chloride 107 98 - 111 mmol/L   CO2 21 (L) 22 - 32 mmol/L   Glucose, Bld 111 (H) 70 - 99  mg/dL   BUN 25 (H) 6 - 20 mg/dL   Creatinine, Ser 1.53 (H) 0.61 - 1.24 mg/dL   Calcium 8.2 (L) 8.9 - 10.3 mg/dL   GFR calc non Af Amer 54 (L) >60 mL/min   GFR calc Af Amer >60 >60 mL/min   Anion gap 9 5 - 15    Comment: Performed at [facility], [address], [place], [place] [address]  Magnesium     Status: None   Collection Time: 01/31/19  3:12 AM  Result Value Ref Range   Magnesium 2.1 1.7 - 2.4 mg/dL    Comment: Performed at [facility], [address] [place], [place] [address]  Hemoglobin A1c     Status: None   Collection Time: 01/31/19  2:31 PM  Result Value Ref Range   Hgb A1c MFr Bld 5.6 4.8 - 5.6 %    Comment: (NOTE) Pre diabetes:          5.7%-6.4% Diabetes:              >6.4% Glycemic control for   <7.0% adults with diabetes    Mean Plasma Glucose 114.02 mg/dL    Comment: Performed at [facility], [place] [address], [place], [place] [address]  Basic metabolic panel     Status: Abnormal   Collection Time: 02/01/19  3:00 AM  Result Value Ref Range  Sodium 138 135 - 145 mmol/L   Potassium 4.0 3.5 - 5.1 mmol/L   Chloride 104 98 - 111 mmol/L   CO2 25 22 - 32 mmol/L   Glucose, Bld 98 70 - 99 mg/dL   BUN 34 (H) 6 - 20 mg/dL   Creatinine, Ser 1.51 (H) 0.61 - 1.24 mg/dL   Calcium 8.3 (L) 8.9 - 10.3 mg/dL   GFR calc non Af Amer 55 (L) >60 mL/min   GFR calc Af Amer >60 >60 mL/min   Anion gap 9 5 - 15    Comment: Performed at [facility], [address] [place], [place] [address]  Lipid panel     Status: Abnormal   Collection Time: 02/01/19  3:00 AM  Result Value Ref Range   Cholesterol 80 0 - 200 mg/dL   Triglycerides 73 <150 mg/dL   HDL 25 (L) >40 mg/dL   Total CHOL/HDL Ratio 3.2 RATIO   VLDL 15 0 - 40 mg/dL   LDL Cholesterol 40 0 - 99 mg/dL    Comment:        Total Cholesterol/HDL:CHD Risk Coronary Heart Disease Risk Table                     Men   Women  1/2 Average Risk   3.4   3.3  Average Risk       5.0   4.4  2 X  Average Risk   9.6   7.1  3 X Average Risk  23.4   11.0        Use the calculated Marc [name] Ratio above and the CHD Risk Table to determine the Marc [name]'s CHD Risk.        ATP III CLASSIFICATION (LDL):  <100     mg/dL   Optimal  100-129  mg/dL   Near or Above                    Optimal  130-159  mg/dL   Borderline  160-189  mg/dL   High  >190     mg/dL   Very High Performed at [facility], [address] [place], [place] [address]     US Abdomen Limited Ruq  Result Date: 02/01/2019 CLINICAL DATA:  Abdominal pain with distention for 2 weeks. EXAM: ULTRASOUND ABDOMEN LIMITED RIGHT UPPER QUADRANT COMPARISON:  None. FINDINGS: Gallbladder: There may be minimal sludge in the gallbladder. No stones, wall thickening, pericholecystic fluid, or Murphy's sign. Common bile duct: Diameter: 3.4 mm Liver: No focal lesion identified. Within normal limits in parenchymal echogenicity. Portal vein is patent on color Doppler imaging with normal direction of blood flow towards the liver. IMPRESSION: Minimal sludge in the gallbladder is not excluded. The gallbladder is otherwise normal in appearance. No other abnormalities. Electronically Signed   By: Dorise Bullion III M.D   On: 02/01/2019 10:52    Review of Systems  Constitutional: Positive for diaphoresis and malaise/fatigue.  HENT: Positive for congestion.   Eyes: Negative.   Respiratory: Positive for cough and shortness of breath.   Cardiovascular: Positive for chest pain, orthopnea, leg swelling and PND.  Gastrointestinal: Positive for heartburn.  Genitourinary: Negative.   Musculoskeletal: Positive for back pain. Negative for falls.  Skin: Negative.   Neurological: Positive for weakness.  Endo/Heme/Allergies: Negative.   Psychiatric/Behavioral: Positive for depression.   Blood pressure 99/72, pulse 77, temperature 98.2 F (36.8 C), temperature source Oral, resp. rate 18, height 5\' 8"  (1.727 m), weight Marc [name])  169.3 kg, SpO2 97  %. Physical Exam  Nursing note and vitals reviewed. Constitutional: He is oriented to person, place, and time. He appears well-developed and well-nourished.  HENT:  Head: Normocephalic and atraumatic.  Eyes: Pupils are equal, round, and reactive to light. Conjunctivae and EOM are normal.  Neck: Normal range of motion. Neck supple.  Cardiovascular: Normal rate and regular rhythm. Exam reveals gallop.  Respiratory: Effort normal and breath sounds normal.  GI: Soft. Bowel sounds are normal.  Musculoskeletal:        General: Edema present.  Neurological: He is alert and oriented to person, place, and time. He has normal reflexes.  Skin: There is erythema.    Assessment/Plan: Acute on chronic respiratory failure Hypertension Obstructive sleep apnea Morbid obesity Peripheral edema Smoking Noncompliance Atypical chest pain Shortness of breath . Plan Agree with admission rule out for myocardial infarction Continue cardiac enzymes and EKGs Supplemental oxygen therapy Obstructive CPAP to help with symptom management Recommend significant weight loss exercise portion control Diuretic therapy for edema shortness of breath Inhalers to help with dyspnea shortness of breath Advised Marc [name] to refrain from smoking Continue hypertension management and control  Denny Lave D Johnatha Zeidman 02/01/2019, 6:07 PM

## 2019-02-01 NOTE — Progress Notes (Signed)
[place] at [place] NAME: Marc [name]    MR#:  [id number]  DATE OF BIRTH:  11/13/1975  SUBJECTIVE:  CHIEF COMPLAINT:   Chief Complaint  Patient presents with  . Respiratory Distress   SOB and abd pain improving. LE still present.  Left chest pain intermittent.  REVIEW OF SYSTEMS:  Review of Systems  Constitutional: Positive for malaise/fatigue. Negative for chills and fever.  HENT: Negative for sore throat.   Eyes: Negative for blurred vision and double vision.  Respiratory: Positive for cough, sputum production and shortness of breath. Negative for hemoptysis, wheezing and stridor.   Cardiovascular: Positive for orthopnea and leg swelling. Negative for chest pain and palpitations.  Gastrointestinal: Positive for abdominal pain, nausea and vomiting. Negative for blood in stool, diarrhea and melena.  Genitourinary: Negative for dysuria, flank pain and hematuria.  Musculoskeletal: Negative for back pain and joint pain.  Skin: Negative for rash.  Neurological: Negative for dizziness, sensory change, focal weakness, seizures, loss of consciousness, weakness and headaches.  Endo/Heme/Allergies: Negative for polydipsia.  Psychiatric/Behavioral: Negative for depression. The patient is not nervous/anxious.     DRUG ALLERGIES:   Allergies  Allergen Reactions  . Tramadol Other (See Comments)    Pt stated that it gave him sores.  . Amoxicillin Rash  . Zithromax [Azithromycin] Rash   VITALS:  Blood pressure 100/74, pulse 67, temperature 98.3 F (36.8 C), temperature source Oral, resp. rate 18, height 5\' 8"  (1.727 m), weight (!) 169.3 kg, SpO2 100 %. PHYSICAL EXAMINATION:  Physical Exam Constitutional:      General: He is not in acute distress.    Comments: Morbid obesity.  HENT:     Head: Normocephalic.     Mouth/Throat:     Mouth: Mucous membranes are moist.  Eyes:     General: No scleral icterus.    Conjunctiva/sclera:  Conjunctivae normal.     Pupils: Pupils are equal, round, and reactive to light.  Neck:     Musculoskeletal: Normal range of motion and neck supple.     Vascular: No JVD.     Trachea: No tracheal deviation.  Cardiovascular:     Rate and Rhythm: Normal rate and regular rhythm.     Heart sounds: Normal heart sounds. No murmur. No gallop.   Pulmonary:     Effort: Pulmonary effort is normal. No respiratory distress.     Breath sounds: No stridor. Rales present. No wheezing or rhonchi.  Abdominal:     General: Bowel sounds are normal. There is distension.     Palpations: Abdomen is soft.     Tenderness: There is abdominal tenderness. There is no rebound.  Musculoskeletal: Normal range of motion.        General: No tenderness.     Right lower leg: Edema present.     Left lower leg: Edema present.  Skin:    Findings: No erythema or rash.  Neurological:     General: No focal deficit present.     Mental Status: He is alert and oriented to person, place, and time.     Cranial Nerves: No cranial nerve deficit.  Psychiatric:        Mood and Affect: Mood normal.    LABORATORY PANEL:  Male CBC Recent Labs  Lab 01/30/19 0626  WBC 8.2  HGB 10.3*  HCT 35.5*  PLT 197   ------------------------------------------------------------------------------------------------------------------ Chemistries  Recent Labs  Lab 01/29/19 1807  01/31/19 0312 02/01/19 0300  NA  140   < > 137 138  K 3.8   < > 4.4 4.0  CL 108   < > 107 104  CO2 23   < > 21* 25  GLUCOSE 107*   < > 111* 98  BUN 12   < > 25* 34*  CREATININE 1.13   < > 1.53* 1.51*  CALCIUM 8.6*   < > 8.2* 8.3*  MG  --   --  2.1  --   AST 28  --   --   --   ALT 19  --   --   --   ALKPHOS 128*  --   --   --   BILITOT 0.8  --   --   --    < > = values in this interval not displayed.   RADIOLOGY:  US Abdomen Limited Ruq  Result Date: 02/01/2019 CLINICAL DATA:  Abdominal pain with distention for 2 weeks. EXAM: ULTRASOUND ABDOMEN  LIMITED RIGHT UPPER QUADRANT COMPARISON:  None. FINDINGS: Gallbladder: There may be minimal sludge in the gallbladder. No stones, wall thickening, pericholecystic fluid, or Murphy's sign. Common bile duct: Diameter: 3.4 mm Liver: No focal lesion identified. Within normal limits in parenchymal echogenicity. Portal vein is patent on color Doppler imaging with normal direction of blood flow towards the liver. IMPRESSION: Minimal sludge in the gallbladder is not excluded. The gallbladder is otherwise normal in appearance. No other abnormalities. Electronically Signed   By: Dorise Bullion III M.D   On: 02/01/2019 10:52   ASSESSMENT AND PLAN:   43 year old male with past medical history of cardiomyopathy ejection fraction of 25 to 16%, chronic systolic CHF, hypertension, obesity, obstructive sleep apnea who presents to the hospital due to shortness of breath and lower extremity edema.  *  Acute respiratory failure with hypoxia-secondary to CHF, .ejection fraction of 20-25% per recent echo - Initially patient presented to the ER was placed on BiPAP, weaned him off BiPAP and placed him on nasal cannula. Try to wean off oxygen as tolerated.  Continue CPAP at bedtime.  *  CHF-acute on chronic systolic dysfunction.  Patient has been noncompliant and has not taken his medications over the past month due to insurance reasons.  Decreased the dose of IV Lasix, follow I's and O's and daily weights. Hold carvedilol, lisinopril due to softer blood pressure.  Intermittent left chest pain is chronic Consulted Dr. Clayborn Bigness. Per patient he has seem him this admission. I have contacted him for further input  *  Essential hypertension- hold carvedilol, lisinopril.  * Elevated troponin-likely in the setting of supply demand ischemia from hypoxemia and CHF.   *  Obstructive sleep apnea-continue CPAP.  * Tobacco abuse.  Smoking cessation counseled on admission.  Abdominal pain and distention with nausea and  vomiting. Abdominal ultrasound with nothing acute  All the records are reviewed and case discussed with Care Management/Social Worker. Management plans discussed with the patient, his sister and they are in agreement.  CODE STATUS: Full Code  TOTAL TIME TAKING CARE OF THIS PATIENT: 35 minutes.   Needs further inpatient diuresis  Neita Carp M.D on 02/01/2019 at 11:39 AM  Between 7am to 6pm - Pager - [phone]  After 6pm go to [email or web address] - [facility]

## 2019-02-01 NOTE — Plan of Care (Signed)
  Problem: Health Behavior/Discharge Planning: Goal: Ability to manage health-related needs will improve Outcome: Not Progressing Note: Pt. c/o abdominal pain and associated nausea. Medicated with opioids twice and PO Zofran once today. Per notes this is a chronic issue. US abdominal today showed minimal acute changes. Will continue to monitor overall progress. Wenda Low [facility]

## 2019-02-02 LAB — BASIC METABOLIC PANEL
Anion gap: 8 (ref 5–15)
BUN: 35 mg/dL — ABNORMAL HIGH (ref 6–20)
CO2: 25 mmol/L (ref 22–32)
Calcium: 8.4 mg/dL — ABNORMAL LOW (ref 8.9–10.3)
Chloride: 105 mmol/L (ref 98–111)
Creatinine, Ser: 1.48 mg/dL — ABNORMAL HIGH (ref 0.61–1.24)
GFR calc Af Amer: >60 mL/min (ref >60)
GFR calc non Af Amer: 57 mL/min — ABNORMAL LOW (ref >60)
Glucose, Bld: 98 mg/dL (ref 70–99)
Potassium: 4.2 mmol/L (ref 3.5–5.1)
Sodium: 138 mmol/L (ref 135–145)

## 2019-02-02 LAB — MAGNESIUM: Magnesium: 2.4 mg/dL (ref 1.7–2.4)

## 2019-02-02 MED ORDER — CARVEDILOL 3.125 MG PO TABS
3.1250 mg | ORAL_TABLET | Freq: Two times a day (BID) | ORAL | Status: DC
  Administered 2019-02-03: 3.125 mg via ORAL
  Filled 2019-02-02: qty 1

## 2019-02-02 NOTE — Progress Notes (Signed)
[facility] at [place] NAME: Marc [name]    MR#:  [id number]  DATE OF BIRTH:  November 08, 1975  SUBJECTIVE:  CHIEF COMPLAINT:   Chief Complaint  Patient presents with  . Respiratory Distress   SOB and abd pain improving.  On RA  Afebrile  REVIEW OF SYSTEMS:  Review of Systems  Constitutional: Positive for malaise/fatigue. Negative for chills and fever.  HENT: Negative for sore throat.   Eyes: Negative for blurred vision and double vision.  Respiratory: Positive for cough, sputum production and shortness of breath. Negative for hemoptysis, wheezing and stridor.   Cardiovascular: Positive for orthopnea and leg swelling. Negative for chest pain and palpitations.  Gastrointestinal: Positive for abdominal pain, nausea and vomiting. Negative for blood in stool, diarrhea and melena.  Genitourinary: Negative for dysuria, flank pain and hematuria.  Musculoskeletal: Negative for back pain and joint pain.  Skin: Negative for rash.  Neurological: Negative for dizziness, sensory change, focal weakness, seizures, loss of consciousness, weakness and headaches.  Endo/Heme/Allergies: Negative for polydipsia.  Psychiatric/Behavioral: Negative for depression. The patient is not nervous/anxious.     DRUG ALLERGIES:   Allergies  Allergen Reactions  . Tramadol Other (See Comments)    Pt stated that it gave him sores.  . Amoxicillin Rash  . Zithromax [Azithromycin] Rash   VITALS:  Blood pressure 107/75, pulse 70, temperature 98.1 F (36.7 C), temperature source Oral, resp. rate 18, height 5\' 8"  (1.727 m), weight (!) 170.7 kg, SpO2 93 %. PHYSICAL EXAMINATION:  Physical Exam Constitutional:      General: He is not in acute distress.    Comments: Morbid obesity.  HENT:     Head: Normocephalic.     Mouth/Throat:     Mouth: Mucous membranes are moist.  Eyes:     General: No scleral icterus.    Conjunctiva/sclera: Conjunctivae normal.   Pupils: Pupils are equal, round, and reactive to light.  Neck:     Musculoskeletal: Normal range of motion and neck supple.     Vascular: No JVD.     Trachea: No tracheal deviation.  Cardiovascular:     Rate and Rhythm: Normal rate and regular rhythm.     Heart sounds: Normal heart sounds. No murmur. No gallop.   Pulmonary:     Effort: Pulmonary effort is normal. No respiratory distress.     Breath sounds: No stridor. Rales present. No wheezing or rhonchi.  Abdominal:     General: Bowel sounds are normal. There is distension.     Palpations: Abdomen is soft.     Tenderness: There is abdominal tenderness. There is no rebound.  Musculoskeletal: Normal range of motion.        General: No tenderness.     Right lower leg: Edema present.     Left lower leg: Edema present.  Skin:    Findings: No erythema or rash.  Neurological:     General: No focal deficit present.     Mental Status: He is alert and oriented to person, place, and time.     Cranial Nerves: No cranial nerve deficit.  Psychiatric:        Mood and Affect: Mood normal.    LABORATORY PANEL:  Male CBC Recent Labs  Lab 01/30/19 0626  WBC 8.2  HGB 10.3*  HCT 35.5*  PLT 197   ------------------------------------------------------------------------------------------------------------------ Chemistries  Recent Labs  Lab 01/29/19 1807  02/02/19 0356  NA 140   < > 138  K  3.8   < > 4.2  CL 108   < > 105  CO2 23   < > 25  GLUCOSE 107*   < > 98  BUN 12   < > 35*  CREATININE 1.13   < > 1.48*  CALCIUM 8.6*   < > 8.4*  MG  --    < > 2.4  AST 28  --   --   ALT 19  --   --   ALKPHOS 128*  --   --   BILITOT 0.8  --   --    < > = values in this interval not displayed.   RADIOLOGY:  No results found. ASSESSMENT AND PLAN:   43 year old male with past medical history of cardiomyopathy ejection fraction of 25 to 73%, chronic systolic CHF, hypertension, obesity, obstructive sleep apnea who presents to the hospital due  to shortness of breath and lower extremity edema.  *  Acute respiratory failure with hypoxia-secondary to CHF, .ejection fraction of 20-25% per recent echo - Initially patient presented to the ER was placed on BiPAP, weaned him off BiPAP and placed him on nasal cannula. Try to wean off oxygen as tolerated.  Continue CPAP at bedtime.  *  CHF-acute on chronic systolic dysfunction.  Patient has been noncompliant and has not taken his medications over the past month due to insurance reasons.  Decreased the dose of IV Lasix, follow I's and O's and daily weights. Hold carvedilol, lisinopril due to softer blood pressure.  Intermittent left chest pain is chronic Consulted Dr. Clayborn Bigness. I discussed with him he advised OP f/u due to atypical pain.  *  Essential hypertension- hold carvedilol, lisinopril.  * Elevated troponin-likely in the setting of supply demand ischemia from hypoxemia and CHF.   *  Obstructive sleep apnea-continue CPAP.  * Tobacco abuse.  Smoking cessation counseled on admission.  Abdominal pain and distention with nausea and vomiting. Abdominal ultrasound with nothing acute  All the records are reviewed and case discussed with Care Management/Social Worker. Management plans discussed with the patient, his sister and they are in agreement.  CODE STATUS: Full Code  TOTAL TIME TAKING CARE OF THIS PATIENT: 35 minutes.   Needs further inpatient diuresis  Neita Carp M.D on 02/02/2019 at 4:32 PM  Between 7am to 6pm - Pager - [phone]  After 6pm go to [email or web address] - [facility]

## 2019-02-03 ENCOUNTER — Inpatient Hospital Stay: Payer: Self-pay

## 2019-02-03 MED ORDER — ALBUTEROL SULFATE HFA 108 (90 BASE) MCG/ACT IN AERS: 2.0000 | INHALATION_SPRAY | Freq: Four times a day (QID) | RESPIRATORY_TRACT | 0 refills | Status: DC | PRN

## 2019-02-03 MED ORDER — HYDROCODONE-ACETAMINOPHEN 5-325 MG PO TABS: 1.0000 | ORAL_TABLET | Freq: Three times a day (TID) | ORAL | 0 refills | Status: DC | PRN

## 2019-02-03 MED ORDER — OXYCODONE HCL 5 MG PO TABS
10.0000 mg | ORAL_TABLET | Freq: Once | ORAL | Status: AC
  Administered 2019-02-03: 10 mg via ORAL
  Filled 2019-02-03: qty 2

## 2019-02-03 MED ORDER — CARVEDILOL 3.125 MG PO TABS: 3.1250 mg | ORAL_TABLET | Freq: Two times a day (BID) | ORAL | 0 refills | Status: DC

## 2019-02-03 MED ORDER — LISINOPRIL 10 MG PO TABS: 10.0000 mg | ORAL_TABLET | Freq: Every day | ORAL | 0 refills | Status: DC

## 2019-02-03 MED ORDER — FUROSEMIDE 40 MG PO TABS: 40.0000 mg | ORAL_TABLET | Freq: Two times a day (BID) | ORAL | 0 refills | Status: DC

## 2019-02-03 NOTE — TOC Transition Note (Signed)
Transition of Care [facility]) - CM/SW Discharge Note   Patient Details  Name: Marc [name] MRN: [id number] Date of Birth: 11/23/1975  [facility] CM/SW Contact:  Ross Ludwig, LCSW Phone Number: 02/03/2019, 5:45 PM   Clinical Narrative:     Patient does not have insurance, patient was able to receive his medication from the medication clinic, however, patient has used medication management three times.  Per medication management patient needs to turn in paperwork or they will not be able to assist him in the future.  Patient also needs to get established with a PCP.  CSW contacted Medication management clinic and was able to get patient's medication.  CSW informed patient that if he does not complete paperwork the medication management clinic will not be able to assist patient in the future.  Patient expressed understanding, patient was also informed that his medications are on the 4 dollar list at [facility].  Patient expressed appreciation for medication, he did not express any other questions or concerns.   Final next level of care: Home/Self Care Barriers to Discharge: No Barriers Identified   Patient Goals and CMS Choice Patient states their goals for this hospitalization and ongoing recovery are:: To return back home      Discharge Placement                 Patient discharged home.      Discharge Plan and Services   Discharge Planning Services: CM Consult, [facility], Medication Assistance            DME Arranged: N/A DME Agency: NA       HH Arranged: NA HH Agency: NA     Representative spoke with at [place]: na  Social Determinants of Health (SDOH) Interventions     Readmission Risk Interventions No flowsheet data found.

## 2019-02-03 NOTE — Discharge Instructions (Signed)
°-   Daily fluids < 2 liters. °- Low salt diet °- Check weight everyday and keep log. Take to your doctors appt. °- Take extra dose of lasix if you gain more than 3 pounds weight. ° ° °

## 2019-02-03 NOTE — Progress Notes (Signed)
Pt complaining of pain. PRN medication not due. MD notified. One time dose order received. I will continue to assess

## 2019-02-03 NOTE — Consult Note (Signed)
[place] - PHARMACIST COUNSELING NOTE  ADHERENCE ASSESSMENT Patient states he is good at taking his medications when he has them. He does not have insurance. I gave him a GoodRx coupon for albuterol as well. Patient stated he understands the importance of taking the medications and will do better this time around. I told him getting a pill box may be beneficial in adherence.   Guideline-Directed Medical Therapy/Evidence Based Medicine  ACE/ARB/ARNI: Previously on lisinopril 40 mg daily. Dose decreased to lisinopril 10 mg daily.   Beta Blocker: Previously on Carvedilol 25 mg BID. Dose decreased to carvedilol 3.125 mg BID.  Aldosterone Antagonist: None  Diuretic: Furosemide 40 mg BID PO.     SUBJECTIVE   HPI: Marc [name]  is a 43 y.o. male with a known history of chronic systolic CHF, severe cardiomyopathy EF of 25 to 30%, hypertension, obstructive sleep apnea, obesity who presents to the hospital due to shortness of breath.  Patient says he has been feeling short of breath over the past week progressively getting worse when he gets short of breath with just minimal exertion.  He also has noted that he has been filling up with fluid and his legs and ankles are quite swollen.  He has some paroxysmal nocturnal dyspnea and also orthopnea.  Due to his worsening edema, shortness of breath he came to the ER for further evaluation.  He was noted to be in acute respiratory failure with hypoxia secondary to underlying CHF.   Past Medical History:  Diagnosis Date  . CHF (congestive heart failure) ([place])   . Hypertension         OBJECTIVE    Vital signs: HR 70, BP 100-120, weight (pounds) 377 lb  ECHO: Date 10/10/2018, EF 20-25%  BMP Latest Ref Rng & Units 02/02/2019 02/01/2019 01/31/2019  Glucose 70 - 99 mg/dL 98 98 111(H)  BUN 6 - 20 mg/dL 35(H) 34(H) 25(H)  Creatinine 0.61 - 1.24 mg/dL 1.48(H) 1.51(H) 1.53(H)  Sodium 135 - 145 mmol/L 138 138 137   Potassium 3.5 - 5.1 mmol/L 4.2 4.0 4.4  Chloride 98 - 111 mmol/L 105 104 107  CO2 22 - 32 mmol/L 25 25 21(L)  Calcium 8.9 - 10.3 mg/dL 8.4(L) 8.3(L) 8.2(L)    ASSESSMENT Patient's blood pressure ran soft during this admission and coreg and lisinopril dose were decreased. If blood pressure is still soft and not able to titrate coreg. Recommend to switch to metoprolol succinate, a selective beta-blocker. Baseline Scr seems to be about 1.10. There was a bump in creatinine on 6/16 but started to trend down. Pt was ~ -3L during this admission.    PLAN Continue to monitor Scr and potassium. No recommendations from a pharmacy standpoint. Patient was counseled on the importance of adherence and daily weights. Pt was informed to call provider if he gains > 3 Lb from one morning to another.   Time spent: 20 minutes  Oswald Hillock, Pharm.D, BCPS Clinical Pharmacist 02/03/2019 1:49 PM  COUNSELING POINTS/CLINICAL PEARLS  Carvedilol (Goal: weight less than 85 kg is 25 mg BID, weight greater than 85 kg is 50 mg BID)  Patient should avoid activities requiring coordination until drug effects are  realized, as drug may cause dizziness.  This drug may cause diarrhea, nausea, vomiting, arthralgia, back pain, myalgia, headache, vision disorder, erectile dysfunction, reduced libido, or fatigue.  Instruct patient to report signs/symptoms of adverse cardiovascular effects such as hypotension (especially in elderly patients), arrhythmias, syncope,  palpitations, angina, or edema.  Drug may mask symptoms of hypoglycemia. Advise diabetic patients to carefully monitor blood sugar levels.  Patient should take drug with food.  Advise patient against sudden discontinuation of drug. Lisinopril (Goal: 20 to 40 mg once daily)  This drug may cause nausea, vomiting, dizziness, headache, or  angioedema of face, lips, throat, or intestines.  Instruct patient to report signs/symptoms of hypotension, or a persistent  cough.   Advise patient against sudden discontinuation of drug. Furosemide  Drug causes sun-sensitivity. Advise patient to use sunscreen and avoid  tanning beds. Patient should avoid activities requiring coordination until drug effects are realized, as drug may cause dizziness, vertigo, or blurred vision. This drug may cause hyperglycemia, hyperuricemia, constipation, diarrhea, loss of appetite, nausea, vomiting, purpuric disorder, cramps, spasticity, asthenia, headache, paresthesia, or scaling eczema. Instruct patient to report unusual bleeding/bruising or signs/symptoms of hypotension, infection, pancreatitis, or ototoxicity (tinnitus, hearing impairment). Advise patient to report signs/symptoms of a severe skin reactions (flu-like symptoms, spreading red rash, or skin/mucous membrane blistering) or erythema multiforme. Instruct patient to eat high-potassium foods during drug therapy, as  directed by healthcare professional.  Patient should not drink alcohol while taking this drug.   DRUGS TO AVOID IN HEART FAILURE  Drug or Class Mechanism  Analgesics . NSAIDs . COX-2 inhibitors . Glucocorticoids  Sodium and water retention, increased systemic vascular resistance, decreased response to diuretics   Diabetes Medications . Metformin . Thiazolidinediones o Rosiglitazone (Avandia) o Pioglitazone (Actos) . DPP4 Inhibitors o Saxagliptin (Onglyza) o Sitagliptin (Januvia)   Lactic acidosis Possible calcium channel blockade   Unknown  Antiarrhythmics . Class I  o Flecainide o Disopyramide . Class III o Sotalol . Other o Dronedarone  Negative inotrope, proarrhythmic   Proarrhythmic, beta blockade  Negative inotrope  Antihypertensives . Alpha Blockers o Doxazosin . Calcium Channel Blockers o Diltiazem o Verapamil o Nifedipine . Central Alpha Adrenergics o Moxonidine . Peripheral Vasodilators o Minoxidil  Increases renin and aldosterone  Negative inotrope    Possible  sympathetic withdrawal  Unknown  Anti-infective . Itraconazole . Amphotericin B  Negative inotrope Unknown  Hematologic . Anagrelide . Cilostazol   Possible inhibition of PD IV Inhibition of PD III causing arrhythmias  Neurologic/Psychiatric . Stimulants . Anti-Seizure Drugs o Carbamazepine o Pregabalin . Antidepressants o Tricyclics o Citalopram . Parkinsons o Bromocriptine o Pergolide o Pramipexole . Antipsychotics o Clozapine . Antimigraine o Ergotamine o Methysergide . Appetite suppressants . Bipolar o Lithium  Peripheral alpha and beta agonist activity  Negative inotrope and chronotrope Calcium channel blockade  Negative inotrope, proarrhythmic Dose-dependent QT prolongation  Excessive serotonin activity/valvular damage Excessive serotonin activity/valvular damage Unknown  IgE mediated hypersensitivy, calcium channel blockade  Excessive serotonin activity/valvular damage Excessive serotonin activity/valvular damage Valvular damage  Direct myofibrillar degeneration, adrenergic stimulation  Antimalarials . Chloroquine . Hydroxychloroquine Intracellular inhibition of lysosomal enzymes  Urologic Agents . Alpha Blockers o Doxazosin o Prazosin o Tamsulosin o Terazosin  Increased renin and aldosterone  Adapted from Page RL, et al. "Drugs That May Cause or Exacerbate Heart Failure: A Scientific Statement from the [place]." Circulation 2016; 852:D78-E42. DOI: 10.1161/CIR.0000000000000426   MEDICATION ADHERENCES TIPS AND STRATEGIES 1. Taking medication as prescribed improves patient outcomes in heart failure (reduces hospitalizations, improves symptoms, increases survival) 2. Side effects of medications can be managed by decreasing doses, switching agents, stopping drugs, or adding additional therapy. Please let someone in the [facility] know if you have having bothersome side effects so we can modify  your regimen. Do not  alter your medication regimen without talking to [place].  3. Medication reminders can help patients remember to take drugs on time. If you are missing or forgetting doses you can try linking behaviors, using pill boxes, or an electronic reminder like an alarm on your phone or an app. Some people can also get automated phone calls as medication reminders.

## 2019-02-03 NOTE — Progress Notes (Signed)
Pt dc home.  All dc instructions given by Tammy RN.  Dc home

## 2019-02-13 NOTE — Discharge Summary (Signed)
[facility] NAME: Carson Bogden    MR#:  [id number]  DATE OF BIRTH:  Jan 22, 1976  DATE OF ADMISSION:  01/29/2019 ADMITTING PHYSICIAN: Henreitta Leber, MD  DATE OF DISCHARGE: 02/03/2019  3:53 PM  PRIMARY CARE PHYSICIAN: Patient, No Pcp Per   ADMISSION DIAGNOSIS:  Shortness of breath [R06.02]  DISCHARGE DIAGNOSIS:  Active Problems:   CHF (congestive heart failure) ([place])   SECONDARY DIAGNOSIS:   Past Medical History:  Diagnosis Date  . CHF (congestive heart failure) ([place])   . Hypertension      ADMITTING HISTORY  Sherif Millspaugh  is a 43 y.o. male with a known history of chronic systolic CHF, severe cardiomyopathy EF of 25 to 30%, hypertension, obstructive sleep apnea, obesity who presents to the hospital due to shortness of breath.  Patient says he has been feeling short of breath over the past week progressively getting worse when he gets short of breath with just minimal exertion.  He also has noted that he has been filling up with fluid and his legs and ankles are quite swollen.  He has some paroxysmal nocturnal dyspnea and also orthopnea.  Due to his worsening edema, shortness of breath he came to the ER for further evaluation.  He was noted to be in acute respiratory failure with hypoxia secondary to underlying CHF and therefore hospitalist services were contacted for admission.  Denies any chest pains, nausea, vomiting, fever, chills, loss of taste, sick contacts.  Patient says he has been following social distancing and has not traveled anywhere recently.  HOSPITAL COURSE:   43 year old male with past medical history of cardiomyopathy ejection fraction of 25 to 26%, chronic systolic CHF, hypertension, obesity, obstructive sleep apnea who presents to the hospital due to shortness of breath and lower extremity edema.  * Acute respiratory failure with hypoxia-secondary to CHF Ejection fraction of 20-25% per recent  echo. Initially patient presented to the ER was placed on BiPAP, weaned him off BiPAP and placed him on nasal cannula. Weaned off oxygen.  * CHF-acute on chronic systolic dysfunction. Patient has been noncompliant and has not taken his medications over the past month due to insurance reasons. Started on IV Lasix, follow I's and O's and daily weights. Diuresed well and SOB has resolved. Initially BP meds held but at discharge BP has improved. Prescriptions given at discharge  Intermittent left chest pain is chronic Consulted Dr. Clayborn Bigness. I discussed with him he advised OP f/u due to atypical pain.  * Essential hypertension-  carvedilol, lisinopril.  *Elevated troponin-likely in the setting of supply demand ischemia from hypoxemia and CHF.   * Obstructive sleep apnea-continue CPAP.  * Tobacco abuse.  Smoking cessation counseled on admission.  * Abdominal pain and distention with nausea and vomiting. Abdominal ultrasound with nothing acute. CT abd with no renal stone or anything acute.  Stable for discharge home  CONSULTS OBTAINED:  Treatment Team:  Yolonda Kida, MD  DRUG ALLERGIES:   Allergies  Allergen Reactions  . Tramadol Other (See Comments)    Pt stated that it gave him sores.  . Amoxicillin Rash  . Zithromax [Azithromycin] Rash    DISCHARGE MEDICATIONS:   Allergies as of 02/03/2019      Reactions   Tramadol Other (See Comments)   Pt stated that it gave him sores.   Amoxicillin Rash   Zithromax [azithromycin] Rash      Medication List    TAKE these medications  albuterol 108 (90 Base) MCG/ACT inhaler Commonly known as: VENTOLIN HFA Inhale 2 puffs into the lungs every 6 (six) hours as needed for up to 30 days for wheezing or shortness of breath.   carvedilol 3.125 MG tablet Commonly known as: COREG Take 1 tablet (3.125 mg total) by mouth 2 (two) times daily with a meal. What changed:   medication strength  how much to  take  when to take this   furosemide 40 MG tablet Commonly known as: LASIX Take 1 tablet (40 mg total) by mouth 2 (two) times daily for 30 days.   HYDROcodone-acetaminophen 5-325 MG tablet Commonly known as: NORCO/VICODIN Take 1 tablet by mouth 3 (three) times daily as needed for severe pain.   lisinopril 10 MG tablet Commonly known as: ZESTRIL Take 1 tablet (10 mg total) by mouth daily for 30 days. What changed:   medication strength  how much to take       Today   VITAL SIGNS:  Blood pressure 123/88, pulse 72, temperature 98.5 F (36.9 C), temperature source Oral, resp. rate 18, height 5\' 8"  (1.727 m), weight (!) 171.3 kg, SpO2 100 %.  I/O:  No intake or output data in the 24 hours ending 02/13/19 1519  PHYSICAL EXAMINATION:  Physical Exam  GENERAL:  43 y.o.-year-old patient lying in the bed with no acute distress.  LUNGS: Normal breath sounds bilaterally, no wheezing, rales,rhonchi or crepitation. No use of accessory muscles of respiration.  CARDIOVASCULAR: S1, S2 normal. No murmurs, rubs, or gallops.  ABDOMEN: Soft, non-tender, non-distended. Bowel sounds present. No organomegaly or mass.  NEUROLOGIC: Moves all 4 extremities. PSYCHIATRIC: The patient is alert and oriented x 3.  SKIN: No obvious rash, lesion, or ulcer.   DATA REVIEW:   CBC No results for input(s): WBC, HGB, HCT, PLT in the last 168 hours.  Chemistries  No results for input(s): NA, K, CL, CO2, GLUCOSE, BUN, CREATININE, CALCIUM, MG, AST, ALT, ALKPHOS, BILITOT in the last 168 hours.  Invalid input(s): GFRCGP  Cardiac Enzymes No results for input(s): TROPONINI in the last 168 hours.  Microbiology Results  Results for orders placed or performed during the hospital encounter of 01/29/19  SARS Coronavirus 2 (CEPHEID- Performed in [facility] hospital lab), Hosp Order     Status: None   Collection Time: 01/29/19  6:07 PM   Specimen: Nasopharyngeal Swab  Result Value Ref Range Status   SARS  Coronavirus 2 NEGATIVE NEGATIVE Final    Comment: (NOTE) If result is NEGATIVE SARS-CoV-2 target nucleic acids are NOT DETECTED. The SARS-CoV-2 RNA is generally detectable in upper and lower  respiratory specimens during the acute phase of infection. The lowest  concentration of SARS-CoV-2 viral copies this assay can detect is 250  copies / mL. A negative result does not preclude SARS-CoV-2 infection  and should not be used as the sole basis for treatment or other  patient management decisions.  A negative result may occur with  improper specimen collection / handling, submission of specimen other  than nasopharyngeal swab, presence of viral mutation(s) within the  areas targeted by this assay, and inadequate number of viral copies  (<250 copies / mL). A negative result must be combined with clinical  observations, patient history, and epidemiological information. If result is POSITIVE SARS-CoV-2 target nucleic acids are DETECTED. The SARS-CoV-2 RNA is generally detectable in upper and lower  respiratory specimens dur ing the acute phase of infection.  Positive  results are indicative of active infection with SARS-CoV-2.  Clinical  correlation with patient history and other diagnostic information is  necessary to determine patient infection status.  Positive results do  not rule out bacterial infection or co-infection with other viruses. If result is PRESUMPTIVE POSTIVE SARS-CoV-2 nucleic acids MAY BE PRESENT.   A presumptive positive result was obtained on the submitted specimen  and confirmed on repeat testing.  While 2019 novel coronavirus  (SARS-CoV-2) nucleic acids may be present in the submitted sample  additional confirmatory testing may be necessary for epidemiological  and / or clinical management purposes  to differentiate between  SARS-CoV-2 and other Sarbecovirus currently known to infect humans.  If clinically indicated additional testing with an alternate test   methodology [phone]) is advised. The SARS-CoV-2 RNA is generally  detectable in upper and lower respiratory sp ecimens during the acute  phase of infection. The expected result is Negative. Fact Sheet for Patients:  StrictlyIdeas.no Fact Sheet for Healthcare Providers: BankingDealers.co.za This test is not yet approved or cleared by the [place] FDA and has been authorized for detection and/or diagnosis of SARS-CoV-2 by FDA under an Emergency Use Authorization (EUA).  This EUA will remain in effect (meaning this test can be used) for the duration of the COVID-19 declaration under Section 564(b)(1) of the Act, 21 U.S.C. section 360bbb-3(b)(1), unless the authorization is terminated or revoked sooner. Performed at [facility], [address] [place], [place] [address]     RADIOLOGY:  No results found.  Follow up with PCP in 1 week.  Management plans discussed with the patient, family and they are in agreement.  CODE STATUS:  Code Status History    Date Active Date Inactive Code Status Order ID Comments User Context   01/29/2019 2151 02/03/2019 1858 Full Code [id number]  Henreitta Leber, MD Inpatient   10/09/2018 1013 10/11/2018 1938 Full Code [id number]  Harrie Foreman, MD ED   Advance Care Planning Activity      TOTAL TIME TAKING CARE OF THIS PATIENT ON DAY OF DISCHARGE: more than 30 minutes.   Leia Alf Mosetta Ferdinand M.D on 02/13/2019 at 3:19 PM  Between 7am to 6pm - Pager - [phone]  After 6pm go to [email or web address] - password [email or web address] [facility]  Office  [phone]  CC: Primary care physician; Patient, No Pcp Per  Note: This dictation was prepared with Dragon dictation along with smaller phrase technology. Any transcriptional errors that result from this process are unintentional.

## 2019-02-15 ENCOUNTER — Telehealth: Payer: Self-pay

## 2019-02-15 NOTE — Telephone Encounter (Signed)
TELEPHONE CALL NOTE  Marc [name] has been deemed a candidate for a follow-up tele-health visit to limit community exposure during the Covid-19 pandemic. I spoke with the patient via phone to ensure availability of phone/video source, confirm preferred email & phone number, discuss instructions and expectations, and review consent.   I reminded Marc [name] to be prepared with any vital sign and/or heart rhythm information that could potentially be obtained via home monitoring, at the time of his visit.  Finally, I reminded Marc [name] to expect an e-mail containing a link for their video-based visit approximately 15 minutes before his visit, or alternatively, a phone call at the time of his visit if his visit is planned to be a phone encounter.  Did the patient verbally consent to treatment as below? YES   ,  L, CMA 02/15/2019 2:00 PM  CONSENT FOR TELE-HEALTH VISIT - PLEASE REVIEW  I hereby voluntarily request, consent and authorize [facility] and its employed or contracted physicians, physician assistants, nurse practitioners or other licensed health care professionals (the Practitioner), to provide me with telemedicine health care services (the "Services") as deemed necessary by the treating Practitioner. I acknowledge and consent to receive the Services by the Practitioner via telemedicine. I understand that the telemedicine visit will involve communicating with the Practitioner through telephonic communication technology and the disclosure of certain medical information by electronic transmission. I acknowledge that I have been given the opportunity to request an in-person assessment or other available alternative prior to the telemedicine visit and am voluntarily participating in the telemedicine visit.  I understand that I have the right to withhold or withdraw my consent to the use of telemedicine in the course of my care at any time, without affecting  my right to future care or treatment, and that the Practitioner or I may terminate the telemedicine visit at any time. I understand that I have the right to inspect all information obtained and/or recorded in the course of the telemedicine visit and may receive copies of available information for a reasonable fee.  I understand that some of the potential risks of receiving the Services via telemedicine include:  Marland Kitchen [name] or interruption in medical evaluation due to technological equipment failure or disruption; . Information transmitted may not be sufficient (e.g. poor resolution of images) to allow for appropriate medical decision making by the Practitioner; and/or  . In rare instances, security protocols could fail, causing a breach of personal health information.  Furthermore, I acknowledge that it is my responsibility to provide information about my medical history, conditions and care that is complete and accurate to the best of my ability. I acknowledge that Practitioner's advice, recommendations, and/or decision may be based on factors not within their control, such as incomplete or inaccurate data provided by me or lack of visual representation. I understand that the practice of medicine is not an exact science and that Practitioner makes no warranties or guarantees regarding treatment outcomes. I acknowledge that I will receive a copy of this consent concurrently upon execution via email to the email address I last provided but may also request a printed copy by calling the office of [facility].    I understand that my insurance may be billed for this visit.   I have read or had this consent read to me. . I understand the contents of this consent, which adequately explains the benefits and risks of the Services being provided via telemedicine.  Marland Kitchen  I have been provided ample opportunity to ask questions regarding this consent and the Services and have had my questions answered to my  satisfaction. . I give my informed consent for the services to be provided through the use of telemedicine in my medical care  By participating in this telemedicine visit I agree to the above.

## 2019-02-16 ENCOUNTER — Other Ambulatory Visit: Payer: Self-pay

## 2019-02-16 ENCOUNTER — Ambulatory Visit: Payer: Self-pay | Attending: Family | Admitting: Family

## 2019-02-16 DIAGNOSIS — I10 Essential (primary) hypertension: Secondary | ICD-10-CM

## 2019-02-16 DIAGNOSIS — I5022 Chronic systolic (congestive) heart failure: Secondary | ICD-10-CM

## 2019-02-16 NOTE — Patient Instructions (Addendum)
Resume weighing daily and call for an overnight weight gain of > 2 pounds or a weekly weight gain of >5 pounds.   [facility] (medical clinic for people who don't have insurance)  [phone]  [address]  [place] [address]  Hours: Tuesday: 4:15 pm - 7:30 pm  Wednesday: 9 am - 1 pm  Thursday: 1 pm - 7:30 pm  Friday - Monday: CLOSED   [facility] (pharmacy for people who don't have insurance)   [phone]

## 2019-02-16 NOTE — Progress Notes (Signed)
Virtual Visit via Telephone Note    Evaluation Performed:  Initial visit  This visit type was conducted due to national recommendations for restrictions regarding the COVID-19 Pandemic (e.g. social distancing).  This format is felt to be most appropriate for this patient at this time.  All issues noted in this document were discussed and addressed.  No physical exam was performed (except for noted visual exam findings with Video Visits).  Please refer to the patient's chart (MyChart message for video visits and phone note for telephone visits) for the patient's consent to telehealth for [facility]  Date:  02/16/2019   ID:  Marc [name], DOB [birth year], MRN [id number]  Patient Location:  [address] [place] [address]   Provider location:   [facility] [address], [place] [address]  PCP:  Patient, No Pcp Per  Cardiologist: Lujean Amel, MD Electrophysiologist:  None   Chief Complaint:  Shortness of breath  History of Present Illness:    Marc [name] is a 43 y.o. male who presents via audio/video conferencing for a telehealth visit today.  Patient verified DOB and address.  The patient does not have symptoms concerning for COVID-19 infection (fever, chills, cough, or new SHORTNESS OF BREATH).   Patient reports minimal shortness of breath upon exertion. He describes this as chronic in nature having been present for several months. He has associated anxiety, back pain, mild abdominal distention and difficulty sleeping. He denies any swelling in legs, cough or weight gain.   He says that he's struggling with affording his medications as he currently doesn't have any insurance (medicaid is pending) and he hasn't worked since February 2020  Prior CV studies:   The following studies were reviewed today:  Echo report from 10/10/2018 reviewed and showed an EF of 20-25% along with moderate MR/ TR.   Past Medical History:   Diagnosis Date  . CHF (congestive heart failure) ([place])   . Hypertension    Past Surgical History:  Procedure Laterality Date  . BICEPS TENDON REPAIR    . FOOT SURGERY     metataral and fasciotomy  . ROTATOR CUFF REPAIR       Current Meds  Medication Sig  . albuterol (VENTOLIN HFA) 108 (90 Base) MCG/ACT inhaler Inhale 2 puffs into the lungs every 6 (six) hours as needed for up to 30 days for wheezing or shortness of breath.  Marland Kitchen aspirin EC 81 MG tablet Take 81 mg by mouth daily.  . carvedilol (COREG) 3.125 MG tablet Take 1 tablet (3.125 mg total) by mouth 2 (two) times daily with a meal.  . HYDROcodone-acetaminophen (NORCO/VICODIN) 5-325 MG tablet Take 1 tablet by mouth 3 (three) times daily as needed for severe pain.  . sacubitril-valsartan (ENTRESTO) 24-26 MG Take 1 tablet by mouth 2 (two) times daily.  Marland Kitchen torsemide (DEMADEX) 20 MG tablet Take 40 mg by mouth 2 (two) times daily.     Allergies:   Tramadol, Amoxicillin, and Zithromax [azithromycin]   Social History   Tobacco Use  . Smoking status: Current Every Day Smoker    Packs/day: 0.50    Years: 25.00    Pack years: 12.50    Types: Cigarettes  . Smokeless tobacco: Never Used  Substance Use Topics  . Alcohol use: Not Currently    Frequency: Never  . Drug use: Not Currently     Family Hx: The patient's family history includes Dementia in his mother; Diabetes in his  father; Hypertension in his father; Stroke in his mother.  ROS:   Please see the history of present illness.     All other systems reviewed and are negative.   Labs/Other Tests and Data Reviewed:    Recent Labs: 10/09/2018: TSH 1.976 01/29/2019: ALT 19; B Natriuretic Peptide 643.0 01/30/2019: Hemoglobin 10.3; Platelets 197 02/02/2019: BUN 35; Creatinine, Ser 1.48; Magnesium 2.4; Potassium 4.2; Sodium 138   Recent Lipid Panel Lab Results  Component Value Date/Time   CHOL 80 02/01/2019 03:00 AM   TRIG 73 02/01/2019 03:00 AM   HDL 25 (L) 02/01/2019  03:00 AM   CHOLHDL 3.2 02/01/2019 03:00 AM   LDLCALC 40 02/01/2019 03:00 AM    Wt Readings from Last 3 Encounters:  02/03/19 (!) 377 lb 11.2 oz (171.3 kg)  10/11/18 (!) 362 lb 14 oz (164.6 kg)     Exam:    Vital Signs:  There were no vitals taken for this visit.   Well nourished, well developed male in no  acute distress.   ASSESSMENT & PLAN:    1. Chronic heart failure with reduced ejection fraction- - NYHA class II - euvolemic per patient's description of symptoms - was weighing himself but he has to get new batteries; instructed him to call for an overnight weight gain of >2 pounds or a weekly weight gain of >5 pounds - not adding salt to his food and has been reading food labels - will [facility] as well as [facility] - will also apply to [employer] for assistance with entresto - saw cardiology Clayborn Bigness) 02/09/2019 - consider titrating entresto/ carvedilol at future visits - BNP 01/29/2019 was 643.0 - will make referral to paramedicine program  2: HTN- - not checking his BP at home - BMP from 02/02/2019 reviewed and showed sodium 138, potassium 4.2, creatinine 1.48 and GFR >60  COVID-19 Education: The signs and symptoms of COVID-19 were discussed with the patient and how to seek care for testing (follow up with PCP or arrange E-visit).  The importance of social distancing was discussed today.  Patient Risk:   After full review of this patients clinical status, I feel that they are at least moderate risk at this time.  Time:   Today, I have spent 19 minutes with the patient with telehealth technology discussing medications, diet and symptoms to report.     Medication Adjustments/Labs and Tests Ordered: Current medicines are reviewed at length with the patient today.  Concerns regarding medicines are outlined above.   Tests Ordered: No orders of the defined types were placed in this  encounter.  Medication Changes: No orders of the defined types were placed in this encounter.   Disposition:  Follow-up in 6 weeks or sooner for any questions/problems before then.   Signed, Alisa Graff, FNP  02/16/2019 1:16 PM    [facility]

## 2019-02-28 ENCOUNTER — Telehealth (HOSPITAL_COMMUNITY): Payer: Self-pay

## 2019-02-28 NOTE — Telephone Encounter (Signed)
Attempted to contact to set up appt but phone was busy.  Will attempt to contact again.   [place] [phone]

## 2019-03-01 ENCOUNTER — Other Ambulatory Visit: Payer: Self-pay

## 2019-03-01 ENCOUNTER — Encounter (HOSPITAL_COMMUNITY): Payer: Self-pay

## 2019-03-01 ENCOUNTER — Other Ambulatory Visit (HOSPITAL_COMMUNITY): Payer: Self-pay

## 2019-03-01 MED ORDER — CARVEDILOL 3.125 MG PO TABS: 3.1250 mg | ORAL_TABLET | Freq: Two times a day (BID) | ORAL | 3 refills | Status: DC

## 2019-03-01 NOTE — Progress Notes (Signed)
Today was a visit at [facility].  Explained the program and he is willing to be part of it.  Gave him a new scale and instructed to weigh daily and write it down.  Explained weight gain of 3 lbs overnight or 5 lbs in a week he needs to call.  He measures his fluid and tries to watch his sodium.  He is not a big breakfast eater, drinks coffee and eats lunch and supper.  Verified his medications and he has all of them today, contacted HF clinic for refills on coreg and entresto will be delivered next week.  [name] is on a grant for 1 year ending June of 2021.  He is aware of his health condition and how serious it is.  He has lost some weight.  Will visit for heart failure, medication compliance and diet.  He advised his medication at this time is affordable and his siter picks them up for him.  He does not have a PCP but he has been instructed to contact [facility], gave info on it and he advised he will call tomorrow.    [place] [phone]

## 2019-03-08 ENCOUNTER — Telehealth (HOSPITAL_COMMUNITY): Payer: Self-pay

## 2019-03-08 NOTE — Telephone Encounter (Signed)
Attempted to contact, no answer left message.  ° °Marc [name] °[place] EMT-Paramedic °[phone] °

## 2019-03-13 ENCOUNTER — Telehealth (HOSPITAL_COMMUNITY): Payer: Self-pay

## 2019-03-13 NOTE — Telephone Encounter (Signed)
Attempted to contact, left message. No answer. Will continue to try.   [place]  [phone]

## 2019-03-17 ENCOUNTER — Other Ambulatory Visit (HOSPITAL_COMMUNITY): Payer: Self-pay

## 2019-03-20 ENCOUNTER — Encounter (HOSPITAL_COMMUNITY): Payer: Self-pay

## 2019-03-20 NOTE — Progress Notes (Signed)
Had a home visit with Marc [name] today.  He states he has all his medications.  He states not been drinking as much water due to affordability of buying bottles and he has been boiling tap water to drink due to it has sulfur in it.  He states in [place] his wife left and he lost his income.  He states he lost his medicarid and has no insurance.  His sister has been buying his groceries and his medications.  His rent is ok for 6 months.  He states feeling ok today, denies chest pain, shortness of breath, headaches or dizziness.  His swelling is down.  He states had some swelling 2 days ago and felt bad but weight came back down.  His appetite is good and appears to be in good mood.  Will check in to services that he may benefit from, he is already aware of medication management and walk in clinic.  Will continue to visit for heart failure.   [place] [phone]

## 2019-03-30 ENCOUNTER — Ambulatory Visit: Payer: Self-pay | Admitting: Family

## 2019-04-03 ENCOUNTER — Telehealth (HOSPITAL_COMMUNITY): Payer: Self-pay

## 2019-04-03 NOTE — Telephone Encounter (Signed)
Contacted [facility] to have a telephone appt.  He states he has been doing ok.  He states he has ran out of carvedilol for past 2 weeks and taking his torsemide every other day due to he is running out.  He states he has lost weight and has not gained.  He states watching closely what he eats and drinks.  He denies chest pain, shortness of breath, headaches or dizziness.  He states has his entresto because he is on a program to get that.  He is almost out of aspirin.  He states the paper work from medication management he is unable to provide all the information they need.  He is working on getting his disability but has not come through yet.  He states his house payment is deferred til March 2021 and he is not sure when his ex wife will stop paying the utility bills.  His sister helps him out with groceries.  He states he is unable to pay for his medications.  After talking with HF clinic, we looked up cost of meds at [facility] and they will be 4 dollars and torsemide will be 9 dollars.  Attempted to contact him back to advise and he did not answer.  Will continue to try to contact him and will contact Education officer, [facility] in [place] [facility] clinic for further guidance.    [place] [phone]

## 2019-05-16 ENCOUNTER — Telehealth (HOSPITAL_COMMUNITY): Payer: Self-pay

## 2019-05-16 NOTE — Telephone Encounter (Signed)
Today was able to get in touch with Marc [name].  He states doing ok, he has ear infection and does not have the money to get it checked out at a doctors.  Advised him to contact [name].  He states will go there today.  He states has switched his meds to [place] and saved him a lot of money.  They are now affordable.  He states has all his medication, verified them.  He states swelling is good and weight is same.  He denies any chest pain, shortness of breath, headaches or dizziness.  He has food and appetite is good.  Gave him information on Energy assistance program, but he states is ok on electricity.  He states he still does not have the energy that he use to have but is getting better.  He watches his diet and fluids, he states.  Will continue to visit for heart failure, medication compliance and diet.   [place] [phone]

## 2019-06-12 ENCOUNTER — Telehealth (HOSPITAL_COMMUNITY): Payer: Self-pay

## 2019-06-12 NOTE — Telephone Encounter (Signed)
Attempted to contact, left message.  ° °Shelly Shoultz °[place] EMT-Paramedic °[phone] °

## 2019-06-26 ENCOUNTER — Telehealth (HOSPITAL_COMMUNITY): Payer: Self-pay

## 2019-06-26 NOTE — Telephone Encounter (Signed)
Attempted to contact, no answer left message to contact me.  Will continue to contact.   [place] [phone]

## 2019-06-29 ENCOUNTER — Other Ambulatory Visit (HOSPITAL_COMMUNITY): Payer: Self-pay

## 2019-06-29 NOTE — Progress Notes (Signed)
Today had a telephone appt with Jedi.  He states been doing good.  He has all his medications, he went to a clinic at [facility] and they refilled his medications for him.  He is still waiting for disability to go through, he has no income at the moment.  He has family support.  He has transportation by his mother in law at the moment.  He states has some swelling that goes away at night.  Meds verified.  He states has not been weighing, he states will start back tomorrow.  Mentioned he has not been to HF clinic or cardiology, he states can not afford it right now.  He denies any chest pain, shortness of breath, headaches or dizziness.  He states been watching high sodium foods and fluid intake. Will reach out to [place] at HF clinic and advise.  He states has everything he needs for everyday life.  Will continue to visit for heart failure.    [place] [phone]

## 2019-07-19 ENCOUNTER — Encounter: Payer: Self-pay | Admitting: Family

## 2019-07-19 ENCOUNTER — Ambulatory Visit: Payer: Self-pay | Attending: Family | Admitting: Family

## 2019-07-19 ENCOUNTER — Telehealth (HOSPITAL_COMMUNITY): Payer: Self-pay | Admitting: Licensed Clinical Social Worker

## 2019-07-19 ENCOUNTER — Other Ambulatory Visit: Payer: Self-pay

## 2019-07-19 VITALS — BP 128/79 | HR 89 | Resp 18 | Ht 68.0 in | Wt 333.0 lb

## 2019-07-19 DIAGNOSIS — Z833 Family history of diabetes mellitus: Secondary | ICD-10-CM | POA: Insufficient documentation

## 2019-07-19 DIAGNOSIS — Z7982 Long term (current) use of aspirin: Secondary | ICD-10-CM | POA: Insufficient documentation

## 2019-07-19 DIAGNOSIS — I5022 Chronic systolic (congestive) heart failure: Secondary | ICD-10-CM | POA: Insufficient documentation

## 2019-07-19 DIAGNOSIS — Z823 Family history of stroke: Secondary | ICD-10-CM | POA: Insufficient documentation

## 2019-07-19 DIAGNOSIS — Z881 Allergy status to other antibiotic agents status: Secondary | ICD-10-CM | POA: Insufficient documentation

## 2019-07-19 DIAGNOSIS — I10 Essential (primary) hypertension: Secondary | ICD-10-CM

## 2019-07-19 DIAGNOSIS — Z79899 Other long term (current) drug therapy: Secondary | ICD-10-CM | POA: Insufficient documentation

## 2019-07-19 DIAGNOSIS — F329 Major depressive disorder, single episode, unspecified: Secondary | ICD-10-CM | POA: Insufficient documentation

## 2019-07-19 DIAGNOSIS — Z720 Tobacco use: Secondary | ICD-10-CM

## 2019-07-19 DIAGNOSIS — M549 Dorsalgia, unspecified: Secondary | ICD-10-CM | POA: Insufficient documentation

## 2019-07-19 DIAGNOSIS — F32A Depression, unspecified: Secondary | ICD-10-CM

## 2019-07-19 DIAGNOSIS — Z880 Allergy status to penicillin: Secondary | ICD-10-CM | POA: Insufficient documentation

## 2019-07-19 DIAGNOSIS — Z885 Allergy status to narcotic agent status: Secondary | ICD-10-CM | POA: Insufficient documentation

## 2019-07-19 DIAGNOSIS — Z8249 Family history of ischemic heart disease and other diseases of the circulatory system: Secondary | ICD-10-CM | POA: Insufficient documentation

## 2019-07-19 DIAGNOSIS — F17210 Nicotine dependence, cigarettes, uncomplicated: Secondary | ICD-10-CM | POA: Insufficient documentation

## 2019-07-19 DIAGNOSIS — G8929 Other chronic pain: Secondary | ICD-10-CM | POA: Insufficient documentation

## 2019-07-19 DIAGNOSIS — F419 Anxiety disorder, unspecified: Secondary | ICD-10-CM | POA: Insufficient documentation

## 2019-07-19 DIAGNOSIS — I110 Hypertensive heart disease with heart failure: Secondary | ICD-10-CM | POA: Insufficient documentation

## 2019-07-19 NOTE — Progress Notes (Signed)
Patient ID: Marc [name], male    DOB: 15-Jun-1976, 43 y.o.   MRN: [id number]  HPI  Marc [name] is a 43 y/o male with a history of HTN, chronic pain, bipolar, current tobacco use and chronic heart failure.   Echo report from 10/10/2018 reviewed and showed an EF of 20-25% along with moderate Marc/TR.   Admitted 01/29/2019 due to acute respiratory failure due to HF exacerbation. Initially needed bipap and IV lasix. Was able to be transitioned to oral diuretics. Cardiology consult obtained. Elevated troponin thought to be due to demand ischemia. Discharged after 5 days.   He presents today for a follow-up visit with a chief complaint of moderate fatigue upon minimal exertion. He says that he feels "tired all the time". He has associated shortness of breath, light-headedness, intermittent chest pain, pedal edema, abdominal distention, chronic pain, depression, anxiety and difficulty sleeping along with this. He denies any palpitations, cough or weight gain.   He is very tearful in the office regarding his finances. Says that he was on disability for numerous years to due to an accident on the job but it was then cancelled. He tried holding down a job but then his health started worsening and he is now unemployed and uninsured. Says that he doesn't know what to do or who to ask for help. Currently working with a lawyer regarding his disability but has been denied twice.   Past Medical History:  Diagnosis Date  . Bipolar affective ([place])   . CHF (congestive heart failure) ([place])   . Hypertension    Past Surgical History:  Procedure Laterality Date  . BICEPS TENDON REPAIR    . FOOT SURGERY     metataral and fasciotomy  . ROTATOR CUFF REPAIR     Family History  Problem Relation Age of Onset  . Stroke Mother   . Dementia Mother   . Hypertension Father   . Diabetes Father    Social History   Tobacco Use  . Smoking status: Current Every Day Smoker    Packs/day: 0.50    Years: 25.00    Pack  years: 12.50    Types: Cigarettes  . Smokeless tobacco: Never Used  Substance Use Topics  . Alcohol use: Not Currently    Frequency: Never   Allergies  Allergen Reactions  . Tramadol Other (See Comments)    Pt stated that it gave him sores.  . Amoxicillin Rash  . Zithromax [Azithromycin] Rash   Prior to Admission medications   Medication Sig Start Date End Date Taking? Authorizing Provider  albuterol (VENTOLIN HFA) 108 (90 Base) MCG/ACT inhaler Inhale 2 puffs into the lungs every 6 (six) hours as needed for up to 30 days for wheezing or shortness of breath. 02/03/19 07/19/19 Yes Sudini, Alveta Heimlich, MD  aspirin EC 81 MG tablet Take 81 mg by mouth daily.   Yes [provider]  sacubitril-valsartan (ENTRESTO) 24-26 MG Take 1 tablet by mouth 2 (two) times daily.   Yes [provider]  torsemide (DEMADEX) 20 MG tablet Take 40 mg by mouth 2 (two) times daily.   Yes [provider]  carvedilol (COREG) 3.125 MG tablet Take 1 tablet (3.125 mg total) by mouth 2 (two) times daily with a meal. 03/01/19 06/29/19  Alisa Graff, FNP     Review of Systems  Constitutional: Positive for fatigue (tired "all the time"). Negative for appetite change.  HENT: Negative for congestion, postnasal drip and sore throat.   Eyes: Negative.  Respiratory: Positive for shortness of breath. Negative for cough.   Cardiovascular: Positive for chest pain (at times) and leg swelling (at times). Negative for palpitations.  Gastrointestinal: Positive for abdominal distention (at times). Negative for abdominal pain.  Endocrine: Negative.   Genitourinary: Negative.   Musculoskeletal: Positive for arthralgias (both knees) and back pain.  Skin: Negative.   Allergic/Immunologic: Negative.   Neurological: Positive for light-headedness. Negative for dizziness.  Hematological: Negative for adenopathy. Does not bruise/bleed easily.  Psychiatric/Behavioral: Positive for dysphoric mood (with financial  matters) and sleep disturbance. The patient is nervous/anxious.    Vitals:   07/19/19 1033  BP: 128/79  Pulse: 89  Resp: 18  SpO2: 97%  Weight: (!) 333 lb (151 kg)  Height: 5\' 8"  (1.727 m)   Wt Readings from Last 3 Encounters:  07/19/19 (!) 333 lb (151 kg)  03/17/19 (!) 328 lb (148.8 kg)  03/01/19 (!) 328 lb 12.8 oz (149.1 kg)   Lab Results  Component Value Date   CREATININE 1.48 (H) 02/02/2019   CREATININE 1.51 (H) 02/01/2019   CREATININE 1.53 (H) 01/31/2019   Physical Exam Vitals signs and nursing note reviewed.  HENT:     Head: Normocephalic and atraumatic.  Neck:     Musculoskeletal: Normal range of motion and neck supple.  Cardiovascular:     Rate and Rhythm: Normal rate and regular rhythm.  Pulmonary:     Effort: Pulmonary effort is normal.     Breath sounds: No wheezing or rales.     Comments: Diminished due to chest wall size Abdominal:     General: There is no distension.     Palpations: Abdomen is soft.     Tenderness: There is no abdominal tenderness.  Musculoskeletal:        General: No tenderness.     Right lower leg: No edema.     Left lower leg: No edema.  Lymphadenopathy:     Cervical: Cervical adenopathy (anterior bilateral) present.  Skin:    General: Skin is warm and dry.  Neurological:     General: No focal deficit present.     Mental Status: He is alert and oriented to person, place, and time.  Psychiatric:        Attention and Perception: Attention normal.        Mood and Affect: Mood is anxious. Affect is tearful.        Behavior: Behavior normal.     Assessment & Plan:  1. Chronic heart failure with reduced ejection fraction- - NYHA class III - euvolemic today - weighing himself and says that his weight does fluctuate; reminded to call for an overnight weight gain of >2 pounds or a weekly weight gain of >5 pounds - not adding salt to his food and has been reading food labels - receiving entresto through [employer]; will increase  entresto to 49/51mg  dose; he says that he should be receiving a new shipment soon and he was instructed that when he receives it, he'll start taking 2 tablets twice daily; will send in new RX at next visit - will get BMP at next visit since titrating up entresto - saw cardiology Clayborn Bigness) 02/09/2019 - BNP 01/29/2019 was 643.0 - participating in paramedicine program - reports receiving his flu vaccine for this season  2: HTN- - BP looks good today - information given again on [facility]; he says that he's turned in paperwork "for something" but he can't remember to who; encouraged him to call them and  see if he needs to send in anything - BMP from 02/02/2019 reviewed and showed sodium 138, potassium 4.2, creatinine 1.48 and GFR >60  3: Anxiety/ depression- - patient tearful in the office due to his financial situation and of not knowing where to go for help or who to ask - was on disability for numerous years and then it was stopped; he worked for a short period of time and then started having health issues and lost his job - working with an Forensic psychologist regarding his disability - information given on [facility] as well as Management consultant given to patient - discussed getting Education officer, museum from [place] involved and he gives me permission to share his information with her; will send her a secure message for assistance with the patient - has chronic pain in his back and used to take hydrocodone but now can't get anyone to prescribe it for him; explained that if he gets approved for [place] that he could then go to a pain clinic  4: Tobacco use- - smoking ~ 1/2 ppd of cigarettes; says that he used to smoke 1 ppd - not interested in stopping completely at this time - complete cessation discussed for 3 minutes with the patient.   Medication bottles were reviewed.  Return in 1 month or sooner for any questions/problems before then.

## 2019-07-19 NOTE — Telephone Encounter (Signed)
CSW consulted by [facility] regarding pt current financial concerns.  Patient reports he was on disability for 14 years following a workplace accident.  Pt states that in May 2019 his disability was revoked- he tried working again but starting in November of 2019 he began getting sick again due to his heart failure and had to stop working in February.  He has applied for Medicaid/disability again but was denied- is now in appeals with the help of a lawyer but unsure about when his case will be seen.  CSW encouraged him to reach out if there was anything I could do to assist in this process.  Pt living in a home and his mortgage payments have been halted due to [place] efforts but he says that is due to expire in a month.  CSW encouraged him to call his [place] now to discuss an extension and let CSW know what they say so we can help to explore options if they deny.  Pt getting food stamps and states this has been sufficient to get him food at this time.  Pt was given resources at [facility] for [facility] to get PCP and discuss medication assistance- pt plans to call them to discuss further- CSW explained other possible option for medication assistance through [facility] if they are unable to help him through [facility].  Pt was also given [facility] Assistance application by HF clinic- CSW explained that if he is approved this would be able to cover all or some of his [facility] bills including [facility]- encouraged pt to complete ASAP so he can be set up with sleep study.   CSW will continue to follow and assist as needed  Jorge Ny, [facility] Desk#: [phone] Cell#: [phone]

## 2019-07-19 NOTE — Patient Instructions (Addendum)
Continue weighing daily and call for an overnight weight gain of > 2 pounds or a weekly weight gain of >5 pounds.  Increase entresto when you get the new shipment to take 2 tablets twice daily.    [facility]   [phone]  [place] [address]  Hours: Tuesday: 4:15 pm - 7:30 pm  Wednesday: 9 am - 1 pm  Thursday: 1 pm - 7:30 pm  Friday - Monday: CLOSED

## 2019-07-24 ENCOUNTER — Telehealth (HOSPITAL_COMMUNITY): Payer: Self-pay | Admitting: Licensed Clinical Social Worker

## 2019-07-24 NOTE — Telephone Encounter (Signed)
CSW called pt to check in regarding financial concerns and concerns with lack of insurance.  Phone went straight to voicemail- left message requesting return call  Jorge Ny, [facility] Desk#: [phone] Cell#: [phone]

## 2019-08-15 ENCOUNTER — Telehealth (HOSPITAL_COMMUNITY): Payer: Self-pay

## 2019-08-15 NOTE — Telephone Encounter (Signed)
Attempted to call, no answer.  Left message.  Will continue to try to reach.   [place] [phone]

## 2019-08-17 ENCOUNTER — Telehealth (HOSPITAL_COMMUNITY): Payer: Self-pay

## 2019-08-17 NOTE — Telephone Encounter (Signed)
Today was able to contact Marc [name].  Had a telephone appt with him.  He states he has been in a lot of pain, he is not sure what is causing it.  He denies swelling, he states has been feeling good other than the pain.  Suggested for him to go to [name] and get checked out, he did state he ran out of flexeril.  He states can not remember what his weight was today, he is on his way to store for groceries.  He denies any chest pain, shortness of breath, headaches or dizziness.  He states has all his medications and verified.  He does remember that he has HF clinic next week.  He states he will be going to that.  Will try to schedule a home visit in next couple of weeks.  Will continue to visit for heart failure.   [place] [phone]

## 2019-08-19 NOTE — Progress Notes (Signed)
Patient ID: Marc [name], male    DOB: March 14, 1976, 44 y.o.   MRN: [id number]  HPI  [name] is a 44 y/o male with a history of HTN, chronic pain, bipolar, current tobacco use and chronic heart failure.   Echo report from 10/10/2018 reviewed and showed an EF of 20-25% along with moderate MR/TR.   Has not been admitted or been in the ED in the last 6 months.   He presents today for a follow-up visit with a chief complaint of moderate fatigue "all the time". He says that this has been present for "quite awhile". He has associated intermittent blurry vision/ chest pain, shortness of breath, light-headedness, hand numbness, depression, difficulty sleeping and chronic pain "everywhere". He denies any abdominal distention, palpitations, pedal edema or cough.   Says that he's been having intermittent chest pain for the last 2 months. Describes it as a stinging sensation over his left upper chest that comes and goes. Doesn't radiate anywhere but he does have chronic pain in his left arm and intermittent numbness in the left hand.   He hasn't been weighing daily but does have scales. Knows that he's gained weight. Says that he drinks ~ 7 bottles of water daily along with some other liquids because he's thirsty "all the time".     Past Medical History:  Diagnosis Date  . Bipolar affective ([place])   . CHF (congestive heart failure) ([place])   . Hypertension    Past Surgical History:  Procedure Laterality Date  . BICEPS TENDON REPAIR    . FOOT SURGERY     metataral and fasciotomy  . ROTATOR CUFF REPAIR     Family History  Problem Relation Age of Onset  . Stroke Mother   . Dementia Mother   . Hypertension Father   . Diabetes Father    Social History   Tobacco Use  . Smoking status: Current Every Day Smoker    Packs/day: 0.50    Years: 25.00    Pack years: 12.50    Types: Cigarettes  . Smokeless tobacco: Never Used  Substance Use Topics  . Alcohol use: Not Currently   Allergies   Allergen Reactions  . Tramadol Other (See Comments)    Pt stated that it gave him sores.  . Amoxicillin Rash  . Zithromax [Azithromycin] Rash   Prior to Admission medications   Medication Sig Start Date End Date Taking? Authorizing Provider  albuterol (VENTOLIN HFA) 108 (90 Base) MCG/ACT inhaler Inhale 2 puffs into the lungs every 6 (six) hours as needed for up to 30 days for wheezing or shortness of breath. 02/03/19 08/21/19 Yes Sudini, Alveta Heimlich, MD  aspirin EC 81 MG tablet Take 81 mg by mouth daily.   Yes [provider]  carvedilol (COREG) 3.125 MG tablet Take 1 tablet (3.125 mg total) by mouth 2 (two) times daily with a meal. 03/01/19 08/21/19 Yes Jules Vidovich A, FNP  sacubitril-valsartan (ENTRESTO) 24-26 MG Take 2 tablets by mouth 2 (two) times daily.    Yes [provider]  torsemide (DEMADEX) 20 MG tablet Take 40 mg by mouth 2 (two) times daily.   Yes [provider]     Review of Systems  Constitutional: Positive for fatigue (tired "all the time"). Negative for appetite change.  HENT: Negative for congestion, postnasal drip and sore throat.   Eyes: Positive for visual disturbance.  Respiratory: Positive for shortness of breath. Negative for cough.   Cardiovascular: Positive for chest pain ("stinging"). Negative for  palpitations and leg swelling.  Gastrointestinal: Negative for abdominal distention and abdominal pain.  Endocrine: Negative.   Genitourinary: Negative.   Musculoskeletal: Positive for arthralgias (both knees) and back pain.  Skin: Negative.   Allergic/Immunologic: Negative.   Neurological: Positive for light-headedness and numbness (left hand/ fingers at times). Negative for dizziness.  Hematological: Negative for adenopathy. Does not bruise/bleed easily.  Psychiatric/Behavioral: Positive for dysphoric mood (with financial matters) and sleep disturbance (trouble sleeping at night). The patient is nervous/anxious.    Vitals:   08/21/19 0918   BP: 118/63  Pulse: 90  Resp: 20  SpO2: 100%  Weight: (!) 348 lb 6.4 oz (158 kg)  Height: 5\' 8"  (1.727 m)   Wt Readings from Last 3 Encounters:  08/21/19 (!) 348 lb 6.4 oz (158 kg)  07/19/19 (!) 333 lb (151 kg)  03/17/19 (!) 328 lb (148.8 kg)   Lab Results  Component Value Date   CREATININE 1.48 (H) 02/02/2019   CREATININE 1.51 (H) 02/01/2019   CREATININE 1.53 (H) 01/31/2019    Physical Exam Vitals and nursing note reviewed.  HENT:     Head: Normocephalic and atraumatic.  Cardiovascular:     Rate and Rhythm: Normal rate and regular rhythm.  Pulmonary:     Effort: Pulmonary effort is normal.     Breath sounds: No wheezing or rales.     Comments: Diminished due to chest wall size Chest:     Chest wall: Tenderness (over left anterior upper chest wall) present.  Abdominal:     General: There is no distension.     Palpations: Abdomen is soft.     Tenderness: There is no abdominal tenderness.  Musculoskeletal:        General: No tenderness.     Cervical back: Normal range of motion and neck supple.     Right lower leg: No edema.     Left lower leg: No edema.  Skin:    General: Skin is warm and dry.  Neurological:     General: No focal deficit present.     Mental Status: He is alert and oriented to person, place, and time.  Psychiatric:        Attention and Perception: Attention normal.        Mood and Affect: Mood is depressed. Mood is not anxious.        Behavior: Behavior normal.     Assessment & Plan:  1. Chronic heart failure with reduced ejection fraction- - NYHA class III - euvolemic today - not weighing himself as he says that he forgets; knows that he's gained weight; encouraged him to weigh every morning and to call for an overnight weight gain of >2 pounds or a weekly weight gain of >5 pounds - weight up 15 pounds from last visit here 1 month ago - not adding salt to his food and has been reading food labels - will get BMP today since entresto was  increased to 49/51mg  at last visit - will discuss increasing carvedilol at next visit - drinking ~ 7 bottles of water daily (112 ounces) along with coffee; explained that he's taking in too much fluids - saw cardiology Clayborn Bigness) 02/09/2019 but is unable to return due to financial difficulty - BNP 01/29/2019 was 643.0 - participating in paramedicine program - reports receiving his flu vaccine for this season  2: Chest pain- - reports it being present for ~ 2 months - intermittent in nature and describes it as stinging - EKG looks ok -  will get troponin and CK-MB today - most likely it's musculoskeletal in origin since his chest wall is tender to palpation; encouraged warm compress  3: HTN- - BP looks good although on the lower side - information given again on [facility] as he says that he's not sure if he's contacted them or not - encouraged him to write information down about when he's contacted which office so he's not trying to remember everything - BMP from 02/02/2019 reviewed and showed sodium 138, potassium 4.2, creatinine 1.48 and GFR >60  4: Anxiety/ depression- - patient admits that he feels depressed due to his finances as well as his chronic pain; emphasized that he needed to [facility] - also says that 76 family members were diagnosed with COVID after attending his aunt's funeral that he chose to not attend - was on disability for numerous years and then it was stopped; he worked for a short period of time and then started having health issues and lost his job - working with an Forensic psychologist regarding his disability - Education officer, museum from [employer] [facility] has called and talked w/patient  5: Tobacco use- - smoking ~ 1/2 ppd of cigarettes; says that he used to smoke 1 ppd - not interested in stopping completely at this time - complete cessation discussed for 3 minutes with the patient.   Patient did not bring his medications nor a  list. Each medication was verbally reviewed with the patient and he was encouraged to bring the bottles to every visit to confirm accuracy of list.  Return in 2 weeks or sooner for any questions/problems before then.

## 2019-08-21 ENCOUNTER — Encounter: Payer: Self-pay | Admitting: Family

## 2019-08-21 ENCOUNTER — Ambulatory Visit: Payer: Self-pay | Attending: Family | Admitting: Family

## 2019-08-21 ENCOUNTER — Other Ambulatory Visit: Payer: Self-pay

## 2019-08-21 VITALS — BP 118/63 | HR 90 | Resp 20 | Ht 68.0 in | Wt 348.4 lb

## 2019-08-21 DIAGNOSIS — Z818 Family history of other mental and behavioral disorders: Secondary | ICD-10-CM | POA: Insufficient documentation

## 2019-08-21 DIAGNOSIS — G8929 Other chronic pain: Secondary | ICD-10-CM | POA: Insufficient documentation

## 2019-08-21 DIAGNOSIS — F17210 Nicotine dependence, cigarettes, uncomplicated: Secondary | ICD-10-CM | POA: Insufficient documentation

## 2019-08-21 DIAGNOSIS — F419 Anxiety disorder, unspecified: Secondary | ICD-10-CM | POA: Insufficient documentation

## 2019-08-21 DIAGNOSIS — Z79899 Other long term (current) drug therapy: Secondary | ICD-10-CM | POA: Insufficient documentation

## 2019-08-21 DIAGNOSIS — Z833 Family history of diabetes mellitus: Secondary | ICD-10-CM | POA: Insufficient documentation

## 2019-08-21 DIAGNOSIS — Z8249 Family history of ischemic heart disease and other diseases of the circulatory system: Secondary | ICD-10-CM | POA: Insufficient documentation

## 2019-08-21 DIAGNOSIS — F319 Bipolar disorder, unspecified: Secondary | ICD-10-CM | POA: Insufficient documentation

## 2019-08-21 DIAGNOSIS — I5022 Chronic systolic (congestive) heart failure: Secondary | ICD-10-CM | POA: Insufficient documentation

## 2019-08-21 DIAGNOSIS — I10 Essential (primary) hypertension: Secondary | ICD-10-CM

## 2019-08-21 DIAGNOSIS — Z880 Allergy status to penicillin: Secondary | ICD-10-CM | POA: Insufficient documentation

## 2019-08-21 DIAGNOSIS — R0789 Other chest pain: Secondary | ICD-10-CM | POA: Insufficient documentation

## 2019-08-21 DIAGNOSIS — R079 Chest pain, unspecified: Secondary | ICD-10-CM

## 2019-08-21 DIAGNOSIS — Z720 Tobacco use: Secondary | ICD-10-CM

## 2019-08-21 DIAGNOSIS — Z823 Family history of stroke: Secondary | ICD-10-CM | POA: Insufficient documentation

## 2019-08-21 DIAGNOSIS — Z7982 Long term (current) use of aspirin: Secondary | ICD-10-CM | POA: Insufficient documentation

## 2019-08-21 DIAGNOSIS — Z560 Unemployment, unspecified: Secondary | ICD-10-CM | POA: Insufficient documentation

## 2019-08-21 DIAGNOSIS — Z885 Allergy status to narcotic agent status: Secondary | ICD-10-CM | POA: Insufficient documentation

## 2019-08-21 DIAGNOSIS — I110 Hypertensive heart disease with heart failure: Secondary | ICD-10-CM | POA: Insufficient documentation

## 2019-08-21 DIAGNOSIS — F329 Major depressive disorder, single episode, unspecified: Secondary | ICD-10-CM

## 2019-08-21 DIAGNOSIS — Z881 Allergy status to other antibiotic agents status: Secondary | ICD-10-CM | POA: Insufficient documentation

## 2019-08-21 LAB — BASIC METABOLIC PANEL
Anion gap: 9 (ref 5–15)
BUN: 14 mg/dL (ref 6–20)
CO2: 30 mmol/L (ref 22–32)
Calcium: 9.1 mg/dL (ref 8.9–10.3)
Chloride: 99 mmol/L (ref 98–111)
Creatinine, Ser: 1.05 mg/dL (ref 0.61–1.24)
GFR calc Af Amer: >60 mL/min (ref >60)
GFR calc non Af Amer: >60 mL/min (ref >60)
Glucose, Bld: 89 mg/dL (ref 70–99)
Potassium: 3.7 mmol/L (ref 3.5–5.1)
Sodium: 138 mmol/L (ref 135–145)

## 2019-08-21 LAB — CK TOTAL AND CKMB (NOT AT ARMC)
CK, MB: 2.7 ng/mL (ref 0.5–5.0)
Relative Index: 0.6 (ref 0.0–2.5)
Total CK: 427 U/L — ABNORMAL HIGH (ref 49–397)

## 2019-08-21 NOTE — Patient Instructions (Addendum)
Continue weighing daily and call for an overnight weight gain of > 2 pounds or a weekly weight gain of >5 pounds.  [facility]   [phone]  [place] [address]  Hours: Tuesday: 4:15 pm - 7:30 pm  Wednesday: 9 am - 1 pm  Thursday: 1 pm - 7:30 pm  Friday - Monday: CLOSED

## 2019-09-05 ENCOUNTER — Telehealth (HOSPITAL_COMMUNITY): Payer: Self-pay

## 2019-09-05 NOTE — Telephone Encounter (Signed)
Weight is 343 lbs.  He has gained about 10 lbs in past month and half.  He states has a little bit of fluid but he has been having problems with sore on foot.  He went to foot doctor today and was given some medicines to help it heal and with the pain.  He has not been sleeping good and not been eating to well.  He has all his medications, verified them.  He states has everything he needs for daily living.  He denied needing assistance with power or utilities.  He is aware he has appt with HF clinic on Friday, he is planning on going to it.  Discussed diet and fluids with him.  He states otherwise other than sore on foot he has been doing well and feeling pretty good.  He tries to stay active, lives alone.  Will continue to visit for heart failure.   [place] [phone]

## 2019-09-06 NOTE — Progress Notes (Deleted)
Patient ID: Marc [name], male    DOB: 17-Nov-1975, 44 y.o.   MRN: [id number]  HPI  Marc [name] is a 44 y/o male with a history of HTN, chronic pain, bipolar, current tobacco use and chronic heart failure.   Echo report from 10/10/2018 reviewed and showed an EF of 20-25% along with moderate Marc/TR.   Has not been admitted or been in the ED in the last 6 months.   He presents today for a follow-up visit with a chief complaint of     Past Medical History:  Diagnosis Date  . Bipolar affective ([place])   . CHF (congestive heart failure) ([place])   . Hypertension    Past Surgical History:  Procedure Laterality Date  . BICEPS TENDON REPAIR    . FOOT SURGERY     metataral and fasciotomy  . ROTATOR CUFF REPAIR     Family History  Problem Relation Age of Onset  . Stroke Mother   . Dementia Mother   . Hypertension Father   . Diabetes Father    Social History   Tobacco Use  . Smoking status: Current Every Day Smoker    Packs/day: 0.50    Years: 25.00    Pack years: 12.50    Types: Cigarettes  . Smokeless tobacco: Never Used  Substance Use Topics  . Alcohol use: Not Currently   Allergies  Allergen Reactions  . Tramadol Other (See Comments)    Pt stated that it gave him sores.  . Amoxicillin Rash  . Zithromax [Azithromycin] Rash      Review of Systems  Constitutional: Positive for fatigue (tired "all the time"). Negative for appetite change.  HENT: Negative for congestion, postnasal drip and sore throat.   Eyes: Positive for visual disturbance.  Respiratory: Positive for shortness of breath. Negative for cough.   Cardiovascular: Positive for chest pain ("stinging"). Negative for palpitations and leg swelling.  Gastrointestinal: Negative for abdominal distention and abdominal pain.  Endocrine: Negative.   Genitourinary: Negative.   Musculoskeletal: Positive for arthralgias (both knees) and back pain.  Skin: Negative.   Allergic/Immunologic: Negative.   Neurological:  Positive for light-headedness and numbness (left hand/ fingers at times). Negative for dizziness.  Hematological: Negative for adenopathy. Does not bruise/bleed easily.  Psychiatric/Behavioral: Positive for dysphoric mood (with financial matters) and sleep disturbance (trouble sleeping at night). The patient is nervous/anxious.      Physical Exam Vitals and nursing note reviewed.  HENT:     Head: Normocephalic and atraumatic.  Cardiovascular:     Rate and Rhythm: Normal rate and regular rhythm.  Pulmonary:     Effort: Pulmonary effort is normal.     Breath sounds: No wheezing or rales.     Comments: Diminished due to chest wall size Chest:     Chest wall: Tenderness (over left anterior upper chest wall) present.  Abdominal:     General: There is no distension.     Palpations: Abdomen is soft.     Tenderness: There is no abdominal tenderness.  Musculoskeletal:        General: No tenderness.     Cervical back: Normal range of motion and neck supple.     Right lower leg: No edema.     Left lower leg: No edema.  Skin:    General: Skin is warm and dry.  Neurological:     General: No focal deficit present.     Mental Status: He is alert and oriented to person, place, and  time.  Psychiatric:        Attention and Perception: Attention normal.        Mood and Affect: Mood is depressed. Mood is not anxious.        Behavior: Behavior normal.     Assessment & Plan:  1. Chronic heart failure with reduced ejection fraction- - NYHA class III - euvolemic today - not weighing himself as he says that he forgets; knows that he's gained weight; encouraged him to weigh every morning and to call for an overnight weight gain of >2 pounds or a weekly weight gain of >5 pounds - weight 348.6 pounds from last visit here 2 weeks ago - not adding salt to his food and has been reading food labels -  - will discuss increasing carvedilol at next visit - drinking ~ 7 bottles of water daily (112  ounces) along with coffee; explained that he's taking in too much fluids - saw cardiology Marc [name]) 02/09/2019 but is unable to return due to financial difficulty - BNP 01/29/2019 was 643.0 - participating in paramedicine program - reports receiving his flu vaccine for this season  2: HTN- - BP  - information given again on [facility] as he says that he's not sure if he's contacted them or not - encouraged him to write information down about when he's contacted which office so he's not trying to remember everything - BMP from 08/21/19 reviewed and showed sodium 138, potassium 3.7, creatinine 1.05 and GFR >60  3: Anxiety/ depression- - patient admits that he feels depressed due to his finances as well as his chronic pain; emphasized that he needed to get established with [facility] - also says that 59 family members were diagnosed with COVID after attending his aunt's funeral that he chose to not attend - was on disability for numerous years and then it was stopped; he worked for a short period of time and then started having health issues and lost his job - working with an Forensic psychologist regarding his disability - Education officer, museum from [employer] [facility] has called and talked w/patient  4: Tobacco use- - smoking ~ 1/2 ppd of cigarettes; says that he used to smoke 1 ppd - not interested in stopping completely at this time - complete cessation discussed for 3 minutes with the patient.   Patient did not bring his medications nor a list. Each medication was verbally reviewed with the patient and he was encouraged to bring the bottles to every visit to confirm accuracy of list.

## 2019-09-08 ENCOUNTER — Telehealth: Payer: Self-pay | Admitting: Family

## 2019-09-08 ENCOUNTER — Other Ambulatory Visit: Payer: Self-pay

## 2019-09-08 ENCOUNTER — Ambulatory Visit: Payer: Self-pay | Attending: Family | Admitting: Family

## 2019-09-08 NOTE — Telephone Encounter (Signed)
Patient did not show for his Heart Failure Clinic appointment on 09/08/19. Will attempt to reschedule.

## 2019-09-11 ENCOUNTER — Telehealth (HOSPITAL_COMMUNITY): Payer: Self-pay

## 2019-09-11 NOTE — Telephone Encounter (Signed)
Contacted to check on why he missed HF clinic appt and to make sure he is doing ok.  No answer/left message.  Will continue to contact.   [place] [phone]

## 2019-10-13 ENCOUNTER — Telehealth (HOSPITAL_COMMUNITY): Payer: Self-pay

## 2019-10-13 NOTE — Telephone Encounter (Signed)
Had a telephone visit for today.  He states doing good lately.  He has appt with HF clinic coming up and aware of it.  He has a lot of anxiety lately and depression.  We talked a lot about God and personal problems with what he has been going through.  He is a Environmental education officer and talked about how it is easy to talk to other people and forget about yourself.  He has been waiting for disability to get approved and getting a court date.   He is behind on bills and wanting get caught up and take some of the stress off of him.  He has everything he needs for daily living right now.  He has all his medications and verified them.  He was asking about medication management to get his medications, will send Otila Kluver with HF clinic a message about switching his meds.  He denies any chest pain, headaches, dizziness or swelling.  Denies shortness of breath.  Talked about diet and fluids.  He has not been weighing, discussed importance of it.  Will continue to visit for heart failure.   [place] [phone]

## 2019-10-18 NOTE — Progress Notes (Signed)
Patient ID: Marc [name], male    DOB: Oct 18, 1975, 44 y.o.   MRN: [id number]  HPI  Marc [name] is a 44 y/o male with a history of HTN, chronic pain, bipolar, current tobacco use and chronic heart failure.   Echo report from 10/10/2018 reviewed and showed an EF of 20-25% along with moderate Marc/TR.   Has not been admitted or been in the ED in the last 6 months.   He presents today for a follow-up visit with a chief complaint of moderate fatigue with little exertion. He describes this as chronic in nature. He has associated shortness of breath, palpitations, abdominal distention (worsening), light-headedness, "pins & needles" down his legs at time, chronic pain, depression, anxiety, difficulty sleeping and weight gain along with this. He denies any pedal edema, chest pain or cough.   Says that he has fallen a "couple" of times and that he's unsure of why. He admits to worsening stress & anxiety related to his health and to his finances. He and his wife are trying to reconcile but he feels like she may leave him again which is another stress that he's dealing with.    Past Medical History:  Diagnosis Date  . Bipolar affective ([place])   . CHF (congestive heart failure) ([place])   . Hypertension    Past Surgical History:  Procedure Laterality Date  . BICEPS TENDON REPAIR    . FOOT SURGERY     metataral and fasciotomy  . ROTATOR CUFF REPAIR     Family History  Problem Relation Age of Onset  . Stroke Mother   . Dementia Mother   . Hypertension Father   . Diabetes Father    Social History   Tobacco Use  . Smoking status: Current Every Day Smoker    Packs/day: 0.50    Years: 25.00    Pack years: 12.50    Types: Cigarettes  . Smokeless tobacco: Never Used  Substance Use Topics  . Alcohol use: Not Currently   Allergies  Allergen Reactions  . Tramadol Other (See Comments)    Pt stated that it gave him sores.  . Amoxicillin Rash  . Zithromax [Azithromycin] Rash   Prior to  Admission medications   Medication Sig Start Date End Date Taking? Authorizing Provider  albuterol (VENTOLIN HFA) 108 (90 Base) MCG/ACT inhaler Inhale 2 puffs into the lungs every 6 (six) hours as needed for up to 30 days for wheezing or shortness of breath. 02/03/19 10/19/19 Yes Sudini, Alveta Heimlich, MD  aspirin EC 81 MG tablet Take 81 mg by mouth daily.   Yes [provider]  carvedilol (COREG) 3.125 MG tablet Take 1 tablet (3.125 mg total) by mouth 2 (two) times daily with a meal. 03/01/19 10/19/19 Yes Baylen Dea A, FNP  sacubitril-valsartan (ENTRESTO) 24-26 MG Take 2 tablets by mouth 2 (two) times daily.    Yes [provider]  torsemide (DEMADEX) 20 MG tablet Take 40 mg by mouth 2 (two) times daily.   Yes [provider]    Review of Systems  Constitutional: Positive for fatigue (tired "all the time"). Negative for appetite change.  HENT: Negative for congestion, postnasal drip and sore throat.   Eyes: Positive for visual disturbance.  Respiratory: Positive for shortness of breath. Negative for cough.   Cardiovascular: Positive for palpitations (at times). Negative for chest pain and leg swelling.  Gastrointestinal: Positive for abdominal distention (worsening). Negative for abdominal pain.  Endocrine: Negative.   Genitourinary: Negative.   Musculoskeletal:  Positive for arthralgias (both knees), back pain and neck pain.  Skin: Negative.   Allergic/Immunologic: Negative.   Neurological: Positive for light-headedness and numbness (down legs at time). Negative for dizziness.  Hematological: Negative for adenopathy. Does not bruise/bleed easily.  Psychiatric/Behavioral: Positive for dysphoric mood (with financial matters) and sleep disturbance (trouble sleeping at night). The patient is nervous/anxious.    Vitals:   10/19/19 0847  BP: 140/90  Pulse: 85  Resp: 20  SpO2: 99%  Weight: (!) 357 lb (161.9 kg)  Height: 5\' 9"  (1.753 m)   Wt Readings from Last 3  Encounters:  10/19/19 (!) 357 lb (161.9 kg)  08/21/19 (!) 348 lb 6.4 oz (158 kg)  07/19/19 (!) 333 lb (151 kg)   Lab Results  Component Value Date   CREATININE 1.05 08/21/2019   CREATININE 1.48 (H) 02/02/2019   CREATININE 1.51 (H) 02/01/2019     Physical Exam Vitals and nursing note reviewed.  HENT:     Head: Normocephalic and atraumatic.  Cardiovascular:     Rate and Rhythm: Normal rate and regular rhythm.  Pulmonary:     Effort: Pulmonary effort is normal.     Breath sounds: No wheezing or rales.     Comments: Diminished due to chest wall size Abdominal:     General: There is distension.     Palpations: Abdomen is soft.     Tenderness: There is no abdominal tenderness.  Musculoskeletal:        General: No tenderness.     Cervical back: Normal range of motion and neck supple.     Right lower leg: No edema.     Left lower leg: No edema.  Skin:    General: Skin is warm and dry.  Neurological:     General: No focal deficit present.     Mental Status: He is alert and oriented to person, place, and time.  Psychiatric:        Attention and Perception: Attention normal.        Mood and Affect: Mood is anxious and depressed.        Behavior: Behavior normal.     Assessment & Plan:  1. Acute on Chronic heart failure with reduced ejection fraction- - NYHA class III - fluid overloaded today with abdominal distention and weight gain - not weighing himself as he says that he forgets; knows that he's gained weight; encouraged him to weigh every morning and to call for an overnight weight gain of >2 pounds or a weekly weight gain of >5 pounds - weight up 9 pounds from last visit here 2 months ago; up a total of 24 pounds since December 2020 - will send for 80mg  IV lasix/ 58meq PO potassium today - will get BMP/BNP today as well - 1 month samples of entresto 49/51 given to patient and [employer] patient assistance filled out today - not adding salt to his food and has been reading  food labels - saw cardiology Clayborn Bigness) 02/09/2019 but is unable to return due to financial difficulty - BNP 01/29/2019 was 643.0 - participating in paramedicine program - reports receiving his flu vaccine for this season  2: HTN- - BP mildly elevated - says that he's struggling with remembering to turn in all the paperwork to various agencies - have scheduled him a NP appt at [facility] for 3/16 at 3:20pm. Since they have a pharmacy there, it might be easier on him - BMP from 08/21/19 reviewed and showed sodium  138, potassium 3.7, creatinine 1.05 and GFR >60  3: Anxiety/ depression- - patient admits that he feels depressed due to his finances as well as his chronic pain; have scheduled him a NP appt per above - was on disability for numerous years and then it was stopped; he worked for a short period of time and then started having health issues and lost his job - working with an Forensic psychologist regarding his disability - emotional support offered  4: Tobacco use- - smoking ~ 1/2 ppd of cigarettes; says that he used to smoke 1 ppd - denies alcohol or drug use - not interested in stopping completely at this time - complete cessation discussed for 3 minutes with the patient.   Patient did not bring his medications nor a list. Each medication was verbally reviewed with the patient and he was encouraged to bring the bottles to every visit to confirm accuracy of list.  Return in 4 days or sooner for any questions/problems before then.

## 2019-10-19 ENCOUNTER — Other Ambulatory Visit: Payer: Self-pay

## 2019-10-19 ENCOUNTER — Other Ambulatory Visit: Payer: Self-pay | Admitting: Family

## 2019-10-19 ENCOUNTER — Encounter: Payer: Self-pay | Admitting: Family

## 2019-10-19 ENCOUNTER — Ambulatory Visit
Admission: RE | Admit: 2019-10-19 | Discharge: 2019-10-19 | Disposition: A | Discharge status: Home / Self Care | Payer: Self-pay | Source: Ambulatory Visit | Attending: Family | Admitting: Family

## 2019-10-19 ENCOUNTER — Ambulatory Visit: Payer: Self-pay | Attending: Family | Admitting: Family

## 2019-10-19 VITALS — BP 140/90 | HR 85 | Resp 20 | Ht 69.0 in | Wt 357.0 lb

## 2019-10-19 DIAGNOSIS — F419 Anxiety disorder, unspecified: Secondary | ICD-10-CM | POA: Insufficient documentation

## 2019-10-19 DIAGNOSIS — Z720 Tobacco use: Secondary | ICD-10-CM

## 2019-10-19 DIAGNOSIS — I110 Hypertensive heart disease with heart failure: Secondary | ICD-10-CM | POA: Insufficient documentation

## 2019-10-19 DIAGNOSIS — F329 Major depressive disorder, single episode, unspecified: Secondary | ICD-10-CM

## 2019-10-19 DIAGNOSIS — F17210 Nicotine dependence, cigarettes, uncomplicated: Secondary | ICD-10-CM | POA: Insufficient documentation

## 2019-10-19 DIAGNOSIS — I5023 Acute on chronic systolic (congestive) heart failure: Secondary | ICD-10-CM

## 2019-10-19 DIAGNOSIS — Z7982 Long term (current) use of aspirin: Secondary | ICD-10-CM | POA: Insufficient documentation

## 2019-10-19 DIAGNOSIS — Z8249 Family history of ischemic heart disease and other diseases of the circulatory system: Secondary | ICD-10-CM | POA: Insufficient documentation

## 2019-10-19 DIAGNOSIS — F319 Bipolar disorder, unspecified: Secondary | ICD-10-CM | POA: Insufficient documentation

## 2019-10-19 DIAGNOSIS — G8929 Other chronic pain: Secondary | ICD-10-CM | POA: Insufficient documentation

## 2019-10-19 DIAGNOSIS — I10 Essential (primary) hypertension: Secondary | ICD-10-CM

## 2019-10-19 DIAGNOSIS — Z79899 Other long term (current) drug therapy: Secondary | ICD-10-CM | POA: Insufficient documentation

## 2019-10-19 LAB — BASIC METABOLIC PANEL
Anion gap: 9 (ref 5–15)
BUN: 13 mg/dL (ref 6–20)
CO2: 26 mmol/L (ref 22–32)
Calcium: 8.7 mg/dL — ABNORMAL LOW (ref 8.9–10.3)
Chloride: 102 mmol/L (ref 98–111)
Creatinine, Ser: 0.97 mg/dL (ref 0.61–1.24)
GFR calc Af Amer: >60 mL/min (ref >60)
GFR calc non Af Amer: >60 mL/min (ref >60)
Glucose, Bld: 102 mg/dL — ABNORMAL HIGH (ref 70–99)
Potassium: 3.8 mmol/L (ref 3.5–5.1)
Sodium: 137 mmol/L (ref 135–145)

## 2019-10-19 LAB — BRAIN NATRIURETIC PEPTIDE: B Natriuretic Peptide: 101 pg/mL — ABNORMAL HIGH (ref 0.0–100.0)

## 2019-10-19 MED ORDER — POTASSIUM CHLORIDE CRYS ER 20 MEQ PO TBCR
EXTENDED_RELEASE_TABLET | ORAL | Status: AC
  Administered 2019-10-19: 11:00:00 40 meq via ORAL
  Filled 2019-10-19: qty 2

## 2019-10-19 MED ORDER — SODIUM CHLORIDE FLUSH 0.9 % IV SOLN
INTRAVENOUS | Status: AC
  Filled 2019-10-19: qty 10

## 2019-10-19 MED ORDER — POTASSIUM CHLORIDE CRYS ER 20 MEQ PO TBCR: 40.0000 meq | EXTENDED_RELEASE_TABLET | Freq: Once | ORAL | Status: AC

## 2019-10-19 MED ORDER — FUROSEMIDE 10 MG/ML IJ SOLN
INTRAMUSCULAR | Status: AC
  Administered 2019-10-19: 80 mg via INTRAVENOUS
  Filled 2019-10-19: qty 8

## 2019-10-19 MED ORDER — FUROSEMIDE 10 MG/ML IJ SOLN: 80.0000 mg | Freq: Once | INTRAMUSCULAR | Status: AC

## 2019-10-19 NOTE — Patient Instructions (Addendum)
Continue weighing daily and call for an overnight weight gain of > 2 pounds or a weekly weight gain of >5 pounds.  Go to [facility] on October 31, 2019 at 3:30pm. Dennis Bast must take your ID and any household income.  [address], [place], [place] [address] [phone]

## 2019-10-23 ENCOUNTER — Telehealth: Payer: Self-pay | Admitting: Family

## 2019-10-23 ENCOUNTER — Ambulatory Visit: Payer: Self-pay | Admitting: Family

## 2019-10-23 NOTE — Telephone Encounter (Signed)
Patient did not show for his Heart Failure Clinic appointment on 10/23/19. Will attempt to reschedule.

## 2019-10-24 ENCOUNTER — Telehealth (HOSPITAL_COMMUNITY): Payer: Self-pay

## 2019-10-24 NOTE — Telephone Encounter (Signed)
Contacted Marc [name] due to missed HF clinic appt yesterday.  He states thought it was 16th, that is his Marc [name] appt.  Rescheduled for this Friday with HF clinic.  He states is feeling better since IV lasix last week.  He did not know his weight at the time.  Will continue to visit for heart failure.   [place] [phone]

## 2019-10-26 NOTE — Progress Notes (Signed)
Patient ID: Marc [name], male    DOB: [birth year], 44 y.o.   MRN: [id number]  HPI  Marc [name] is a 44 y/o male with a history of HTN, chronic pain, bipolar, current tobacco use and chronic heart failure.   Echo report from 10/10/2018 reviewed and showed an EF of 20-25% along with moderate Marc/TR.   Has not been admitted or been in the ED in the last 6 months.   He presents today for a follow-up visit with a chief complaint of moderate fatigue upon minimal exertion. He describes this as chronic in nature having been present for quite awhile where he feels tired "all the time". He has associated shortness of breath, pedal edema (improving), palpitations, abdominal distention (improving), light-headedness, chronic pain, anxiety/depression and difficulty sleeping along with this. He denies any chest pain, cough or weight gain.   Received 80mg  IV lasix/ 49meq PO potassium last week and said that he does feel a little bit better since he received this.   Past Medical History:  Diagnosis Date  . Bipolar affective ([place])   . CHF (congestive heart failure) ([place])   . Hypertension    Past Surgical History:  Procedure Laterality Date  . BICEPS TENDON REPAIR    . FOOT SURGERY     metataral and fasciotomy  . ROTATOR CUFF REPAIR     Family History  Problem Relation Age of Onset  . Stroke Mother   . Dementia Mother   . Hypertension Father   . Diabetes Father    Social History   Tobacco Use  . Smoking status: Current Every Day Smoker    Packs/day: 0.50    Years: 25.00    Pack years: 12.50    Types: Cigarettes  . Smokeless tobacco: Never Used  Substance Use Topics  . Alcohol use: Not Currently   Allergies  Allergen Reactions  . Tramadol Other (See Comments)    Pt stated that it gave him sores.  . Amoxicillin Rash  . Zithromax [Azithromycin] Rash   Prior to Admission medications   Medication Sig Start Date End Date Taking? Authorizing Provider  albuterol (VENTOLIN HFA) 108 (90  Base) MCG/ACT inhaler Inhale 2 puffs into the lungs every 6 (six) hours as needed for up to 30 days for wheezing or shortness of breath. 02/03/19 10/27/19 Yes Sudini, Alveta Heimlich, MD  aspirin EC 81 MG tablet Take 81 mg by mouth daily.   Yes [provider]  carvedilol (COREG) 3.125 MG tablet Take 1 tablet (3.125 mg total) by mouth 2 (two) times daily with a meal. 03/01/19 10/27/19 Yes Jibril Mcminn A, FNP  sacubitril-valsartan (ENTRESTO) 49-51 MG Take 1 tablet by mouth 2 (two) times daily.    Yes [provider]  torsemide (DEMADEX) 20 MG tablet Take 40 mg by mouth 2 (two) times daily.   Yes [provider]    Review of Systems  Constitutional: Positive for fatigue (tired "all the time"). Negative for appetite change.  HENT: Negative for congestion, postnasal drip and sore throat.   Eyes: Negative.   Respiratory: Positive for shortness of breath. Negative for cough.   Cardiovascular: Positive for palpitations (at times) and leg swelling. Negative for chest pain.  Gastrointestinal: Positive for abdominal distention (improving). Negative for abdominal pain.  Endocrine: Negative.   Genitourinary: Negative.   Musculoskeletal: Positive for arthralgias (both knees), back pain and neck pain.  Skin: Negative.   Allergic/Immunologic: Negative.   Neurological: Positive for light-headedness, numbness (down legs at time) and headaches.  Negative for dizziness.  Hematological: Negative for adenopathy. Does not bruise/bleed easily.  Psychiatric/Behavioral: Positive for dysphoric mood (with financial matters) and sleep disturbance (trouble sleeping at night; wearing CPAP). The patient is nervous/anxious.    Vitals:   10/27/19 1008 10/27/19 1019  BP: (!) 138/100 118/72  Pulse: 86   Resp: 18   SpO2: 97%   Weight: (!) 359 lb 2 oz (162.9 kg)   Height: 5\' 9"  (1.753 m)    Wt Readings from Last 3 Encounters:  10/27/19 (!) 359 lb 2 oz (162.9 kg)  10/19/19 (!) 357 lb (161.9 kg)  08/21/19  (!) 348 lb 6.4 oz (158 kg)   Lab Results  Component Value Date   CREATININE 0.97 10/19/2019   CREATININE 1.05 08/21/2019   CREATININE 1.48 (H) 02/02/2019    Physical Exam Vitals and nursing note reviewed.  HENT:     Head: Normocephalic and atraumatic.  Cardiovascular:     Rate and Rhythm: Normal rate and regular rhythm.  Pulmonary:     Effort: Pulmonary effort is normal.     Breath sounds: No wheezing or rales.     Comments: Diminished due to chest wall size Abdominal:     General: There is no distension.     Palpations: Abdomen is soft.     Tenderness: There is no abdominal tenderness.  Musculoskeletal:        General: No tenderness.     Cervical back: Normal range of motion and neck supple.     Right lower leg: No edema.     Left lower leg: No edema.  Skin:    General: Skin is warm and dry.  Neurological:     General: No focal deficit present.     Mental Status: He is alert and oriented to person, place, and time.  Psychiatric:        Attention and Perception: Attention normal.        Mood and Affect: Mood is anxious and depressed.        Behavior: Behavior normal.     Assessment & Plan:  1. Chronic heart failure with reduced ejection fraction- - NYHA class III - euvolemic today - not weighing himself as he says that he forgets; encouraged him to weigh every morning and to call for an overnight weight gain of >2 pounds or a weekly weight gain of >5 pounds - weight up 2 pounds from last visit here 1 week ago - received 80mg  IV lasix/ 15meq PO potassium last week & subjectively says that he feels better - will add spironolactone 25mg  daily - will check BMP at his next visit - not adding salt to his food and has been reading food labels - BP may not allow for entresto titration - saw cardiology Clayborn Bigness) 02/09/2019 but is unable to return due to financial difficulty - BNP 10/19/19 was 101.0 - participating in paramedicine program - reports receiving his flu vaccine  for this season - wearing bipap at night - consider adding farxiga or jardiance in the future.   2: HTN- - BP initially elevated after walking to our office (138/100) but had improved upon recheck to [place] - emphasized that he needs to keep NP appt at [facility] for 3/16 at 3:20pm. Since they have a pharmacy there, it might be easier on him - BMP from 10/19/19 reviewed and showed sodium 137, potassium 3.8, creatinine 0.97 and GFR >60  3: Anxiety/ depression- - patient admits that he feels depressed due  to his finances as well as his chronic pain - was on disability for numerous years and then it was stopped; he worked for a short period of time and then started having health issues and lost his job - working with an Forensic psychologist regarding his disability - emotional support offered  4: Tobacco use- - smoking ~ 1/2 ppd of cigarettes; says that he used to smoke 1 ppd - denies alcohol or drug use - not interested in stopping completely at this time - complete cessation discussed for 3 minutes with the patient.   Patient did not bring his medications nor a list. Each medication was verbally reviewed with the patient and he was encouraged to bring the bottles to every visit to confirm accuracy of list.  Return in 2 weeks or sooner for any questions/problems before then.

## 2019-10-27 ENCOUNTER — Encounter: Payer: Self-pay | Admitting: Family

## 2019-10-27 ENCOUNTER — Other Ambulatory Visit: Payer: Self-pay

## 2019-10-27 ENCOUNTER — Ambulatory Visit: Payer: Self-pay | Attending: Family | Admitting: Family

## 2019-10-27 VITALS — BP 118/72 | HR 86 | Resp 18 | Ht 69.0 in | Wt 359.1 lb

## 2019-10-27 DIAGNOSIS — G8929 Other chronic pain: Secondary | ICD-10-CM | POA: Insufficient documentation

## 2019-10-27 DIAGNOSIS — I5022 Chronic systolic (congestive) heart failure: Secondary | ICD-10-CM | POA: Insufficient documentation

## 2019-10-27 DIAGNOSIS — Z880 Allergy status to penicillin: Secondary | ICD-10-CM | POA: Insufficient documentation

## 2019-10-27 DIAGNOSIS — F319 Bipolar disorder, unspecified: Secondary | ICD-10-CM | POA: Insufficient documentation

## 2019-10-27 DIAGNOSIS — Z8249 Family history of ischemic heart disease and other diseases of the circulatory system: Secondary | ICD-10-CM | POA: Insufficient documentation

## 2019-10-27 DIAGNOSIS — I110 Hypertensive heart disease with heart failure: Secondary | ICD-10-CM | POA: Insufficient documentation

## 2019-10-27 DIAGNOSIS — Z888 Allergy status to other drugs, medicaments and biological substances status: Secondary | ICD-10-CM | POA: Insufficient documentation

## 2019-10-27 DIAGNOSIS — R5383 Other fatigue: Secondary | ICD-10-CM | POA: Insufficient documentation

## 2019-10-27 DIAGNOSIS — Z881 Allergy status to other antibiotic agents status: Secondary | ICD-10-CM | POA: Insufficient documentation

## 2019-10-27 DIAGNOSIS — F419 Anxiety disorder, unspecified: Secondary | ICD-10-CM | POA: Insufficient documentation

## 2019-10-27 DIAGNOSIS — Z720 Tobacco use: Secondary | ICD-10-CM

## 2019-10-27 DIAGNOSIS — F17210 Nicotine dependence, cigarettes, uncomplicated: Secondary | ICD-10-CM | POA: Insufficient documentation

## 2019-10-27 DIAGNOSIS — F329 Major depressive disorder, single episode, unspecified: Secondary | ICD-10-CM

## 2019-10-27 DIAGNOSIS — Z79899 Other long term (current) drug therapy: Secondary | ICD-10-CM | POA: Insufficient documentation

## 2019-10-27 DIAGNOSIS — I10 Essential (primary) hypertension: Secondary | ICD-10-CM

## 2019-10-27 DIAGNOSIS — Z7982 Long term (current) use of aspirin: Secondary | ICD-10-CM | POA: Insufficient documentation

## 2019-10-27 MED ORDER — SPIRONOLACTONE 25 MG PO TABS: 25.0000 mg | ORAL_TABLET | Freq: Every day | ORAL | 3 refills | Status: DC

## 2019-10-27 NOTE — Patient Instructions (Addendum)
Continue weighing daily and call for an overnight weight gain of > 2 pounds or a weekly weight gain of >5 pounds.   New appointment at [facility] on 3/16 at 3:20pm. Arrive at 3:00pm   Begin taking spironolactone once daily.    Will consider adding jardiance in the future

## 2019-10-31 ENCOUNTER — Telehealth: Payer: Self-pay | Admitting: Pharmacy Technician

## 2019-10-31 NOTE — Telephone Encounter (Signed)
Attempted to call patient.  Patient unavailable.  Left message.  Mailing patient a new patient packet to obtain ongoing Medication Management Clinic services.  [facility] must receive requested financial documentation in order to determine eligibility and provide additional medication assistance.  [place] [facility]

## 2019-11-07 ENCOUNTER — Telehealth (HOSPITAL_COMMUNITY): Payer: Self-pay

## 2019-11-07 NOTE — Telephone Encounter (Signed)
Today had a telephone visit with Marc [name].  He states feeling much better.  Did go to his appt with [facility].  He was very pleased with the appt.  He was able to get his medications.  He has filled out application for medication management and will drop it off when he has his appt with HF clinic on Friday.  He states is feeling much better, you can tell talking to him his mood is better and feeling better.  He states they addressed his leg cramps that was keeping him up at night, prescribed flexeril.  He states not weighing but will get back to it.  He tries to watch fluids and high sodium foods.  He denies any problems today, he states swelling is down.  Will continue to visit for heart failure.   Marc [name] [phone]

## 2019-11-09 NOTE — Progress Notes (Signed)
Patient ID: [name], male    DOB: 08/19/1975, 44 y.o.   MRN: [id number]  HPI  Marc [name] is a 44 y/o male with a history of HTN, chronic pain, bipolar, current tobacco use and chronic heart failure.   Echo report from 10/10/2018 reviewed and showed an EF of 20-25% along with moderate Marc/TR.   Has not been admitted or been in the ED in the last 6 months.   He presents today for a follow-up visit with a chief complaint of moderate fatigue upon minimal exertion. He describes this as chronic in nature having been present for several months. He has associated shortness of breath, light-headedness, headaches, leg weakness, palpitations, difficulty sleeping and chronic pain along with this. He denies any abdominal distention, pedal edema, chest pain, cough or weight gain.   Says that he went to his [name] P[facility] appointment and was put on a muscle relaxer which has helped with the muscle spasms allowing him to sleep better. He is going to be seeing the therapist that is also there.   Past Medical History:  Diagnosis Date  . Bipolar affective ([name])   . CHF (congestive heart failure) ([name])   . Hypertension    Past Surgical History:  Procedure Laterality Date  . BICEPS TENDON REPAIR    . FOOT SURGERY     metataral and fasciotomy  . ROTATOR CUFF REPAIR     Family History  Problem Relation Age of Onset  . Stroke Mother   . Dementia Mother   . Hypertension Father   . Diabetes Father    Social History   Tobacco Use  . Smoking status: Current Every Day Smoker    Packs/day: 0.50    Years: 25.00    Pack years: 12.50    Types: Cigarettes  . Smokeless tobacco: Never Used  Substance Use Topics  . Alcohol use: Not Currently   Allergies  Allergen Reactions  . Tramadol Other (See Comments)    Pt stated that it gave him sores.  . Amoxicillin Rash  . Zithromax [Azithromycin] Rash   Prior to Admission medications   Medication Sig Start Date End Date Taking? Authorizing  Provider  albuterol (VENTOLIN HFA) 108 (90 Base) MCG/ACT inhaler Inhale 2 puffs into the lungs every 6 (six) hours as needed for up to 30 days for wheezing or shortness of breath. 02/03/19 11/10/19 Yes Sudini, Alveta Heimlich, MD  aspirin EC 81 MG tablet Take 81 mg by mouth daily.   Yes [provider]  carvedilol (COREG) 3.125 MG tablet Take 1 tablet (3.125 mg total) by mouth 2 (two) times daily with a meal. 03/01/19 11/10/19 Yes Arshiya Jakes A, FNP  cyclobenzaprine (FLEXERIL) 5 MG tablet Take 10 mg by mouth 3 (three) times daily as needed for muscle spasms.   Yes [provider]  lidocaine (LIDODERM) 5 % Place 1 patch onto the skin daily. Remove & Discard patch within 12 hours or as directed by MD   Yes [provider]  sacubitril-valsartan (ENTRESTO) 49-51 MG Take 1 tablet by mouth 2 (two) times daily.    Yes [provider]  spironolactone (ALDACTONE) 25 MG tablet Take 1 tablet (25 mg total) by mouth daily. 10/27/19 01/25/20 Yes Miguel Christiana, Otila Kluver A, FNP  torsemide (DEMADEX) 20 MG tablet Take 40 mg by mouth 2 (two) times daily.   Yes [provider]     Review of Systems  Constitutional: Positive for fatigue (tired "all the time"). Negative for appetite change.  HENT: Negative for congestion, postnasal drip and sore throat.   Eyes: Negative.   Respiratory: Positive for shortness of breath. Negative for cough.   Cardiovascular: Positive for palpitations (at times). Negative for chest pain and leg swelling.  Gastrointestinal: Negative for abdominal distention and abdominal pain.  Endocrine: Negative.   Genitourinary: Negative.   Musculoskeletal: Positive for arthralgias (both knees), back pain and neck pain.  Skin: Negative.   Allergic/Immunologic: Negative.   Neurological: Positive for weakness (in legs), light-headedness, numbness (down legs at time) and headaches. Negative for dizziness.  Hematological: Negative for adenopathy. Does not bruise/bleed easily.   Psychiatric/Behavioral: Positive for dysphoric mood (with financial matters) and sleep disturbance (wearing CPAP; sleeping better with flexeril). The patient is nervous/anxious.    Vitals:   11/10/19 1013  BP: (!) 142/96  Pulse: 87  Resp: 20  SpO2: 100%  Weight: (!) 357 lb (161.9 kg)  Height: 5\' 9"  (1.753 m)   Wt Readings from Last 3 Encounters:  11/10/19 (!) 357 lb (161.9 kg)  10/27/19 (!) 359 lb 2 oz (162.9 kg)  10/19/19 (!) 357 lb (161.9 kg)   Lab Results  Component Value Date   CREATININE 0.87 11/10/2019   CREATININE 0.97 10/19/2019   CREATININE 1.05 08/21/2019    Physical Exam Vitals and nursing note reviewed.  HENT:     Head: Normocephalic and atraumatic.  Cardiovascular:     Rate and Rhythm: Normal rate and regular rhythm.  Pulmonary:     Effort: Pulmonary effort is normal.     Breath sounds: No wheezing or rales.     Comments: Diminished due to chest wall size Abdominal:     General: There is no distension.     Palpations: Abdomen is soft.     Tenderness: There is no abdominal tenderness.  Musculoskeletal:        General: No tenderness.     Cervical back: Normal range of motion and neck supple.     Right lower leg: No tenderness. No edema.     Left lower leg: No tenderness. No edema.  Skin:    General: Skin is warm and dry.  Neurological:     General: No focal deficit present.     Mental Status: He is alert and oriented to person, place, and time.  Psychiatric:        Attention and Perception: Attention normal.        Mood and Affect: Mood is anxious. Mood is not depressed.        Behavior: Behavior normal.     Assessment & Plan:  1. Chronic heart failure with reduced ejection fraction- - NYHA class III - euvolemic today - weighing daily; reminded to call for an overnight weight gain of >2 pounds or a weekly weight gain of >5 pounds - weight down 2 pounds from last visit here 2 weeks ago - will check BMP today since spironolactone 25mg  daily was  added at last visit - not adding salt to his food and has been reading food labels - increase entresto to 97/103mg  BID; new RX faxed to [employer] as he's receiving the medication through their patient assistance program - he is to continue his 49/51mg  dose until he received the new RX so that he doesn't run out  - will check BMP at next visit due to entresto titration - saw cardiology [facility]) 02/09/2019 but is unable to return due to financial difficulty - BNP 10/19/19 was 101.0 - participating in paramedicine program - wearing bipap at  night - consider adding farxiga or jardiance in the future.   2: HTN- - BP mildly elevated today; increasing entresto per above - saw PCP at [facility] on 10/31/19; returns in ~ 1 month - BMP from 10/19/19 reviewed and showed sodium 137, potassium 3.8, creatinine 0.97 and GFR >60  3: Anxiety/ depression- - patient says that he's going to be seeing the therapist at [name] - does say that he feels a little better since sleeping better and is glad that he's gotten established with primary care - working with an attorney regarding his disability  4: Tobacco use- - smoking ~ 1/2 ppd of cigarettes; says that he used to smoke 1 ppd - denies alcohol or drug use - not interested in stopping completely at this time - complete cessation discussed for 3 minutes with the patient.    Patient did not bring his medications nor a list. Each medication was verbally reviewed with the patient and he was encouraged to bring the bottles to every visit to confirm accuracy of list.  Return in 1 month or sooner for any questions/problems before then.

## 2019-11-10 ENCOUNTER — Other Ambulatory Visit: Payer: Self-pay

## 2019-11-10 ENCOUNTER — Encounter: Payer: Self-pay | Admitting: Family

## 2019-11-10 ENCOUNTER — Ambulatory Visit: Payer: Self-pay | Attending: Family | Admitting: Family

## 2019-11-10 VITALS — BP 142/96 | HR 87 | Resp 20 | Ht 69.0 in | Wt 357.0 lb

## 2019-11-10 DIAGNOSIS — R0602 Shortness of breath: Secondary | ICD-10-CM | POA: Insufficient documentation

## 2019-11-10 DIAGNOSIS — Z7901 Long term (current) use of anticoagulants: Secondary | ICD-10-CM | POA: Insufficient documentation

## 2019-11-10 DIAGNOSIS — Z7982 Long term (current) use of aspirin: Secondary | ICD-10-CM | POA: Insufficient documentation

## 2019-11-10 DIAGNOSIS — F329 Major depressive disorder, single episode, unspecified: Secondary | ICD-10-CM

## 2019-11-10 DIAGNOSIS — F17210 Nicotine dependence, cigarettes, uncomplicated: Secondary | ICD-10-CM | POA: Insufficient documentation

## 2019-11-10 DIAGNOSIS — R002 Palpitations: Secondary | ICD-10-CM | POA: Insufficient documentation

## 2019-11-10 DIAGNOSIS — I5022 Chronic systolic (congestive) heart failure: Secondary | ICD-10-CM | POA: Insufficient documentation

## 2019-11-10 DIAGNOSIS — Z720 Tobacco use: Secondary | ICD-10-CM

## 2019-11-10 DIAGNOSIS — R531 Weakness: Secondary | ICD-10-CM | POA: Insufficient documentation

## 2019-11-10 DIAGNOSIS — R5383 Other fatigue: Secondary | ICD-10-CM | POA: Insufficient documentation

## 2019-11-10 DIAGNOSIS — Z8249 Family history of ischemic heart disease and other diseases of the circulatory system: Secondary | ICD-10-CM | POA: Insufficient documentation

## 2019-11-10 DIAGNOSIS — R519 Headache, unspecified: Secondary | ICD-10-CM | POA: Insufficient documentation

## 2019-11-10 DIAGNOSIS — Z79899 Other long term (current) drug therapy: Secondary | ICD-10-CM | POA: Insufficient documentation

## 2019-11-10 DIAGNOSIS — I10 Essential (primary) hypertension: Secondary | ICD-10-CM

## 2019-11-10 DIAGNOSIS — R42 Dizziness and giddiness: Secondary | ICD-10-CM | POA: Insufficient documentation

## 2019-11-10 DIAGNOSIS — G8929 Other chronic pain: Secondary | ICD-10-CM | POA: Insufficient documentation

## 2019-11-10 DIAGNOSIS — F419 Anxiety disorder, unspecified: Secondary | ICD-10-CM

## 2019-11-10 DIAGNOSIS — I110 Hypertensive heart disease with heart failure: Secondary | ICD-10-CM | POA: Insufficient documentation

## 2019-11-10 LAB — BASIC METABOLIC PANEL
Anion gap: 10 (ref 5–15)
BUN: 10 mg/dL (ref 6–20)
CO2: 28 mmol/L (ref 22–32)
Calcium: 9.1 mg/dL (ref 8.9–10.3)
Chloride: 102 mmol/L (ref 98–111)
Creatinine, Ser: 0.87 mg/dL (ref 0.61–1.24)
GFR calc Af Amer: >60 mL/min (ref >60)
GFR calc non Af Amer: >60 mL/min (ref >60)
Glucose, Bld: 100 mg/dL — ABNORMAL HIGH (ref 70–99)
Potassium: 4.1 mmol/L (ref 3.5–5.1)
Sodium: 140 mmol/L (ref 135–145)

## 2019-11-10 MED ORDER — SACUBITRIL-VALSARTAN 97-103 MG PO TABS: 1.0000 | ORAL_TABLET | Freq: Two times a day (BID) | ORAL | 3 refills | Status: DC

## 2019-11-10 NOTE — Patient Instructions (Signed)
Continue weighing daily and call for an overnight weight gain of > 2 pounds or a weekly weight gain of >5 pounds. 

## 2019-11-28 ENCOUNTER — Telehealth (HOSPITAL_COMMUNITY): Payer: Self-pay

## 2019-11-28 NOTE — Telephone Encounter (Signed)
Attempted to contact, left message.  ° °Maliq Pilley °[place] EMT-Paramedic °[phone] °

## 2019-12-05 ENCOUNTER — Telehealth (HOSPITAL_COMMUNITY): Payer: Self-pay

## 2019-12-05 NOTE — Telephone Encounter (Signed)
Today was able to have a telephone appt with [name].  He states he is still having a lot of leg cramps.  He has [name] appt tomorrow to discuss more of his leg cramps and sleep.  He has HF appt on Monday 4/26 and he says he will go to that.  He states has all his medications and taking them.  He states [name] should ship this coming month, he has talked to them.  He has not been weighing, he states he will.  He complains of being tired all the time.  He had to go out of town last week for a family funeral.  He denies any swelling in lower extremities and states abdomen is soft and does not feel tight.   Tries to watch high sodium foods and watches how much fluids he drinks a day.  He states appetite has not been real good past week.  Will continue to visit for heart failure and medication management.   [place] [phone]

## 2019-12-11 ENCOUNTER — Encounter: Payer: Self-pay | Admitting: Family

## 2019-12-11 ENCOUNTER — Other Ambulatory Visit: Payer: Self-pay

## 2019-12-11 ENCOUNTER — Ambulatory Visit: Payer: Self-pay | Attending: Family | Admitting: Family

## 2019-12-11 VITALS — BP 170/99 | HR 102 | Resp 18 | Ht 69.0 in | Wt 365.2 lb

## 2019-12-11 DIAGNOSIS — F419 Anxiety disorder, unspecified: Secondary | ICD-10-CM | POA: Insufficient documentation

## 2019-12-11 DIAGNOSIS — Z880 Allergy status to penicillin: Secondary | ICD-10-CM | POA: Insufficient documentation

## 2019-12-11 DIAGNOSIS — Z7982 Long term (current) use of aspirin: Secondary | ICD-10-CM | POA: Insufficient documentation

## 2019-12-11 DIAGNOSIS — Z881 Allergy status to other antibiotic agents status: Secondary | ICD-10-CM | POA: Insufficient documentation

## 2019-12-11 DIAGNOSIS — Z833 Family history of diabetes mellitus: Secondary | ICD-10-CM | POA: Insufficient documentation

## 2019-12-11 DIAGNOSIS — Z79899 Other long term (current) drug therapy: Secondary | ICD-10-CM | POA: Insufficient documentation

## 2019-12-11 DIAGNOSIS — Z885 Allergy status to narcotic agent status: Secondary | ICD-10-CM | POA: Insufficient documentation

## 2019-12-11 DIAGNOSIS — F329 Major depressive disorder, single episode, unspecified: Secondary | ICD-10-CM | POA: Insufficient documentation

## 2019-12-11 DIAGNOSIS — I5022 Chronic systolic (congestive) heart failure: Secondary | ICD-10-CM

## 2019-12-11 DIAGNOSIS — Z720 Tobacco use: Secondary | ICD-10-CM

## 2019-12-11 DIAGNOSIS — I10 Essential (primary) hypertension: Secondary | ICD-10-CM

## 2019-12-11 DIAGNOSIS — F32A Depression, unspecified: Secondary | ICD-10-CM

## 2019-12-11 DIAGNOSIS — I110 Hypertensive heart disease with heart failure: Secondary | ICD-10-CM | POA: Insufficient documentation

## 2019-12-11 DIAGNOSIS — Z8249 Family history of ischemic heart disease and other diseases of the circulatory system: Secondary | ICD-10-CM | POA: Insufficient documentation

## 2019-12-11 DIAGNOSIS — Z823 Family history of stroke: Secondary | ICD-10-CM | POA: Insufficient documentation

## 2019-12-11 DIAGNOSIS — F17210 Nicotine dependence, cigarettes, uncomplicated: Secondary | ICD-10-CM | POA: Insufficient documentation

## 2019-12-11 DIAGNOSIS — Z599 Problem related to housing and economic circumstances, unspecified: Secondary | ICD-10-CM | POA: Insufficient documentation

## 2019-12-11 NOTE — Progress Notes (Signed)
Patient ID: Marc [name], male    DOB: 02/03/1976, 44 y.o.   MRN: [id number]  HPI  Marc [name] is a 44 y/o male with a history of HTN, chronic pain, bipolar, current tobacco use and chronic heart failure.   Echo report from 10/10/2018 reviewed and showed an EF of 20-25% along with moderate Marc/TR.   Has not been admitted or been in the ED in the last 6 months.   He presents today for a follow-up visit with a chief complaint of moderate fatigue with little exertion. He says that he's tired "all the time" and it's been like this for quite some time. He has associated shortness of breath, palpitations, light-headedness, numbness/ pain in left hip/ leg, depression, difficultly sleeping and gradual weight gain along with this. He denies any abdominal distention, pedal edema, chest pain or cough.   Says that he recently travelled to [place], [place] for an aunt's funeral and reports worsening sciatica pain upon his return. Says that he will be starting Cymbalta for this from his PCP. He also says that he didn't take his CPAP with him so was without it for a couple of days.   Not weighing himself consistently. Is waiting on his entresto shipment to arrive from [employer] so is taking entresto 49/51mg  BID to make it last. Did not take any of his medications yet this morning because he says that the diuretic and spironolactone makes his urinate frequently for a couple of hours.   Past Medical History:  Diagnosis Date  . Bipolar affective ([place])   . CHF (congestive heart failure) ([place])   . Hypertension    Past Surgical History:  Procedure Laterality Date  . BICEPS TENDON REPAIR    . FOOT SURGERY     metataral and fasciotomy  . ROTATOR CUFF REPAIR     Family History  Problem Relation Age of Onset  . Stroke Mother   . Dementia Mother   . Hypertension Father   . Diabetes Father    Social History   Tobacco Use  . Smoking status: Current Every Day Smoker    Packs/day: 0.50    Years: 25.00   Pack years: 12.50    Types: Cigarettes  . Smokeless tobacco: Never Used  Substance Use Topics  . Alcohol use: Not Currently   Allergies  Allergen Reactions  . Tramadol Other (See Comments)    Pt stated that it gave him sores.  . Amoxicillin Rash  . Zithromax [Azithromycin] Rash   Prior to Admission medications   Medication Sig Start Date End Date Taking? Authorizing Provider  albuterol (VENTOLIN HFA) 108 (90 Base) MCG/ACT inhaler Inhale 2 puffs into the lungs every 6 (six) hours as needed for up to 30 days for wheezing or shortness of breath. 02/03/19 12/11/19 Yes Sudini, Alveta Heimlich, MD  aspirin EC 81 MG tablet Take 81 mg by mouth daily.   Yes [provider]  carvedilol (COREG) 3.125 MG tablet Take 1 tablet (3.125 mg total) by mouth 2 (two) times daily with a meal. 03/01/19 12/11/19 Yes Raymon Schlarb A, FNP  cyclobenzaprine (FLEXERIL) 5 MG tablet Take 10 mg by mouth 3 (three) times daily as needed for muscle spasms.   Yes [provider]  lidocaine (LIDODERM) 5 % Place 1 patch onto the skin daily. Remove & Discard patch within 12 hours or as directed by MD   Yes [provider]  spironolactone (ALDACTONE) 25 MG tablet Take 1 tablet (25 mg total) by mouth daily. 10/27/19  01/25/20 Yes Majour Frei, Otila Kluver A, FNP  torsemide (DEMADEX) 20 MG tablet Take 40 mg by mouth 2 (two) times daily.   Yes [provider]  sacubitril-valsartan (ENTRESTO) 49/51 MG Take 1 tablet by mouth 2 (two) times daily. 11/10/19   Alisa Graff, FNP     Review of Systems  Constitutional: Positive for fatigue (tired "all the time"). Negative for appetite change.  HENT: Negative for congestion, postnasal drip and sore throat.   Eyes: Negative.   Respiratory: Positive for shortness of breath. Negative for cough.   Cardiovascular: Positive for palpitations (at times). Negative for chest pain and leg swelling.  Gastrointestinal: Negative for abdominal distention and abdominal pain.  Endocrine:  Negative.   Genitourinary: Negative.   Musculoskeletal: Positive for arthralgias (left hip pain), back pain and neck pain.  Skin: Negative.   Allergic/Immunologic: Negative.   Neurological: Positive for light-headedness, numbness (down legs at time) and headaches. Negative for dizziness.  Hematological: Negative for adenopathy. Does not bruise/bleed easily.  Psychiatric/Behavioral: Positive for dysphoric mood (with financial matters) and sleep disturbance (wearing CPAP; sleeping better with flexeril). The patient is nervous/anxious.    Vitals:   12/11/19 0818  BP: (!) 170/99  Pulse: (!) 102  Resp: 18  SpO2: 100%  Weight: (!) 365 lb 4 oz (165.7 kg)  Height: 5\' 9"  (1.753 m)   Wt Readings from Last 3 Encounters:  12/11/19 (!) 365 lb 4 oz (165.7 kg)  11/10/19 (!) 357 lb (161.9 kg)  10/27/19 (!) 359 lb 2 oz (162.9 kg)   Lab Results  Component Value Date   CREATININE 0.87 11/10/2019   CREATININE 0.97 10/19/2019   CREATININE 1.05 08/21/2019     Physical Exam Vitals and nursing note reviewed.  HENT:     Head: Normocephalic and atraumatic.  Cardiovascular:     Rate and Rhythm: Normal rate and regular rhythm.  Pulmonary:     Effort: Pulmonary effort is normal.     Breath sounds: No wheezing or rales.     Comments: Diminished due to chest wall size Abdominal:     General: There is no distension.     Palpations: Abdomen is soft.     Tenderness: There is no abdominal tenderness.  Musculoskeletal:        General: No tenderness.     Cervical back: Normal range of motion and neck supple.     Right lower leg: No tenderness. No edema.     Left lower leg: No tenderness. No edema.  Skin:    General: Skin is warm and dry.  Neurological:     General: No focal deficit present.     Mental Status: He is alert and oriented to person, place, and time.  Psychiatric:        Attention and Perception: Attention normal.        Mood and Affect: Mood is depressed. Mood is not anxious.         Behavior: Behavior normal.     Assessment & Plan:  1. Chronic heart failure with reduced ejection fraction- - NYHA class III - euvolemic today - not weighing daily; reminded to get back in the habit to weigh daily and to call for an overnight weight gain of >2 pounds or a weekly weight gain of >5 pounds - weight up 8 pounds from last visit here 1 month ago - admits to not being very active because of his sciatica pain - not adding salt to his food and has been reading food  labels - still waiting to get the 97/103mg  entresto dose; he says that it should be here soon so he's still been taking the 49/51mg  dose - will check BMP at next visit due to entresto titration (if he receives the shipment) - saw cardiology [facility]) 02/09/2019 but is unable to return due to financial difficulty - BNP 10/19/19 was 101.0 - participating in paramedicine program - wearing bipap at night - consider adding farxiga or jardiance in the future.   2: HTN- - BP elevated although he hasn't taken any of his medications this morning - encouraged him to take everything but his diuretic (says that it makes him urinate for a couple of hours) prior to coming to his appointment - saw PCP at [facility] on 10/31/19; returns in ~ 1 month - BMP from 10/19/19 reviewed and showed sodium 137, potassium 3.8, creatinine 0.97 and GFR >60  3: Anxiety/ depression- - patient says that he's talked to the therapist at [name] - is going to be starting cymbalta today once he picks it up - working with an attorney regarding his disability  4: Tobacco use- - smoking ~ 1/2 ppd of cigarettes; says that he used to smoke 1 ppd - denies alcohol or drug use - not interested in stopping completely at this time - complete cessation discussed for 3 minutes with the patient.     Patient did not bring his medications nor a list. Each medication was verbally reviewed with the patient and he was encouraged to bring  the bottles to every visit to confirm accuracy of list.  Return in 1 month or sooner for any questions/problems before then.

## 2019-12-11 NOTE — Patient Instructions (Addendum)
Begin weighing daily and call for an overnight weight gain of > 2 pounds or a weekly weight gain of >5 pounds.   Bring medication bottles with you to every appointment.

## 2019-12-19 ENCOUNTER — Telehealth: Payer: Self-pay | Admitting: Pharmacist

## 2019-12-19 NOTE — Telephone Encounter (Signed)
Patient failed to provide requested 2021 financial documentation. No additional medication assistance will be provided by [facility] without the required proof of income documentation. Patient notified by letter Debra Cheek Administrative Assistant [facility] 

## 2019-12-20 ENCOUNTER — Telehealth (HOSPITAL_COMMUNITY): Payer: Self-pay

## 2019-12-20 NOTE — Telephone Encounter (Signed)
Contacted him after receiving a message from [name] at [facility] stating he did not fill out the financial part of paper work.  Advised [name] that he has to report all financial on the forms to be able to get his medications.  He states he has no income and has drained his checking and savings.  He states he put that on the forms.  [name] to see what else he needs to do and if he needs another form to be filled out I could take it to him.  He states he will do anything to be able to get his medications because he can not afford them.  His sister has been paying for them for several months. Will continue to follow up with this situation.   [name] [phone]

## 2020-01-05 NOTE — Progress Notes (Signed)
Patient ID: Marc [name], male    DOB: [birth year], 44 y.o.   MRN: [id number]  HPI  Marc [name] is a 44 y/o male with a history of HTN, chronic pain, bipolar, current tobacco use and chronic heart failure.   Echo report from 10/10/2018 reviewed and showed an EF of 20-25% along with moderate Marc/TR.   Has not been admitted or been in the ED in the last 6 months.   He presents today for a follow-up visit with a chief complaint of moderate fatigue upon minimal exertion. He describes this as chronic in nature having been present for several years. He has associated shortness of breath, pedal edema, occasional palpitations, light-headedness, muscle cramping, chronic pain, depression, anxiety and difficulty sleeping along with this. He denies any abdominal distention, chest pain, cough or weight gain.   Says that he hasn't taken any of his medications yet today because he gets a little dizzy after taking them.   Past Medical History:  Diagnosis Date  . Bipolar affective ([place])   . CHF (congestive heart failure) ([place])   . Hypertension    Past Surgical History:  Procedure Laterality Date  . BICEPS TENDON REPAIR    . FOOT SURGERY     metataral and fasciotomy  . ROTATOR CUFF REPAIR     Family History  Problem Relation Age of Onset  . Stroke Mother   . Dementia Mother   . Hypertension Father   . Diabetes Father    Social History   Tobacco Use  . Smoking status: Current Every Day Smoker    Packs/day: 0.50    Years: 25.00    Pack years: 12.50    Types: Cigarettes  . Smokeless tobacco: Never Used  Substance Use Topics  . Alcohol use: Not Currently   Allergies  Allergen Reactions  . Geodon [Ziprasidone] Other (See Comments)    paralysis  . Tramadol Other (See Comments)    Pt stated that it gave him sores.  . Amoxicillin Rash  . Zithromax [Azithromycin] Rash   Prior to Admission medications   Medication Sig Start Date End Date Taking? Authorizing Provider  albuterol  (VENTOLIN HFA) 108 (90 Base) MCG/ACT inhaler Inhale 2 puffs into the lungs every 6 (six) hours as needed for up to 30 days for wheezing or shortness of breath. 02/03/19 01/08/20 Yes Sudini, Alveta Heimlich, MD  aspirin EC 81 MG tablet Take 81 mg by mouth daily.   Yes [provider]  carvedilol (COREG) 3.125 MG tablet Take 1 tablet (3.125 mg total) by mouth 2 (two) times daily with a meal. 03/01/19 01/08/20 Yes Lex Linhares A, FNP  cyclobenzaprine (FLEXERIL) 5 MG tablet Take 10 mg by mouth 3 (three) times daily as needed for muscle spasms.   Yes [provider]  lidocaine (LIDODERM) 5 % Place 1 patch onto the skin daily. Remove & Discard patch within 12 hours or as directed by MD   Yes [provider]  sacubitril-valsartan (ENTRESTO) 97-103 MG Take 1 tablet by mouth 2 (two) times daily. 11/10/19  Yes Darylene Price A, FNP  spironolactone (ALDACTONE) 25 MG tablet Take 1 tablet (25 mg total) by mouth daily. 10/27/19 01/25/20 Yes Tell Rozelle, Otila Kluver A, FNP  torsemide (DEMADEX) 20 MG tablet Take 40 mg by mouth 2 (two) times daily.   Yes [provider]     Review of Systems  Constitutional: Positive for fatigue (tired "all the time"). Negative for appetite change.  HENT: Negative for congestion, postnasal drip and sore  throat.   Eyes: Negative.   Respiratory: Positive for shortness of breath. Negative for cough.   Cardiovascular: Positive for palpitations (at times) and leg swelling. Negative for chest pain.  Gastrointestinal: Negative for abdominal distention and abdominal pain.  Endocrine: Negative.   Genitourinary: Negative.   Musculoskeletal: Positive for back pain, myalgias ("muscle cramps") and neck pain.  Skin: Negative.   Allergic/Immunologic: Negative.   Neurological: Positive for light-headedness, numbness (down legs at time) and headaches. Negative for dizziness.  Hematological: Negative for adenopathy. Does not bruise/bleed easily.  Psychiatric/Behavioral: Positive for  dysphoric mood (with financial matters) and sleep disturbance (wearing CPAP). The patient is nervous/anxious.    Vitals:   01/08/20 0818  BP: (!) 166/92  Pulse: (!) 104  Resp: 18  SpO2: 97%  Weight: (!) 359 lb 2 oz (162.9 kg)  Height: 5\' 9"  (1.753 m)   Wt Readings from Last 3 Encounters:  01/08/20 (!) 359 lb 2 oz (162.9 kg)  12/11/19 (!) 365 lb 4 oz (165.7 kg)  11/10/19 (!) 357 lb (161.9 kg)   Lab Results  Component Value Date   CREATININE 0.87 11/10/2019   CREATININE 0.97 10/19/2019   CREATININE 1.05 08/21/2019    Physical Exam Vitals and nursing note reviewed.  HENT:     Head: Normocephalic and atraumatic.  Cardiovascular:     Rate and Rhythm: Normal rate and regular rhythm.  Pulmonary:     Effort: Pulmonary effort is normal.     Breath sounds: No wheezing or rales.     Comments: Diminished due to chest wall size Abdominal:     General: There is no distension.     Palpations: Abdomen is soft.     Tenderness: There is no abdominal tenderness.  Musculoskeletal:        General: No tenderness.     Cervical back: Normal range of motion and neck supple.     Right lower leg: No tenderness. No edema.     Left lower leg: No tenderness. No edema.  Skin:    General: Skin is warm and dry.  Neurological:     General: No focal deficit present.     Mental Status: He is alert and oriented to person, place, and time.  Psychiatric:        Attention and Perception: Attention normal.        Mood and Affect: Mood is anxious. Mood is not depressed.        Behavior: Behavior normal.     Assessment & Plan:  1. Chronic heart failure with reduced ejection fraction- - NYHA class III - euvolemic today - not weighing daily; reminded to get back in the habit to weigh daily and to call for an overnight weight gain of >2 pounds or a weekly weight gain of >5 pounds - weight down 6 pounds from last visit here 1 month ago - admits to not being very active because of his sciatica pain -  not adding salt to his food and has been reading food labels - will check BMP/ Mg today (has received his entresto 97/103mg  shipment from [employer]) - saw cardiology Clayborn Bigness) 02/09/2019 but is unable to return due to financial difficulty - BNP 10/19/19 was 101.0 - participating in paramedicine program - wearing cpap at night - consider adding farxiga or jardiance in the future; needs to get all paperwork with [facility] completed prior to this as he won't be able to afford this  - has received first COVID vaccine  2:  HTN- - BP elevated but he hasn't taken any of his medications prior to coming to the office as he says that he feels dizzy after taking them - saw PCP at [facility] mid April 2021 - BMP from 11/10/19 reviewed and showed sodium 140, potassium 4.1, creatinine 0.87 and GFR >60  3: Anxiety/ depression- - patient says that he's talked to the therapist at [name] - did not start cymbalta due to concerns about previous experience with these medications (geodon caused paralysis with him); encouraged him to return to therapist as dealing with life stressors needs to be managed whether he takes any medication or not - working with an attorney regarding his disability; has court date scheduled for 04/01/20  4: Tobacco use- - smoking ~ 1/2 ppd of cigarettes; says that he used to smoke 1 ppd - denies alcohol or drug use - not interested in stopping completely at this time - complete cessation discussed for 3 minutes with the patient.     Patient did not bring his medications nor a list. Each medication was verbally reviewed with the patient and he was encouraged to bring the bottles to every visit to confirm accuracy of list.  Return in 3 months or sooner for any questions/problems before then

## 2020-01-08 ENCOUNTER — Ambulatory Visit: Payer: Self-pay | Attending: Family | Admitting: Family

## 2020-01-08 ENCOUNTER — Encounter: Payer: Self-pay | Admitting: Family

## 2020-01-08 ENCOUNTER — Other Ambulatory Visit: Payer: Self-pay

## 2020-01-08 VITALS — BP 166/92 | HR 104 | Resp 18 | Ht 69.0 in | Wt 359.1 lb

## 2020-01-08 DIAGNOSIS — G8929 Other chronic pain: Secondary | ICD-10-CM | POA: Insufficient documentation

## 2020-01-08 DIAGNOSIS — I10 Essential (primary) hypertension: Secondary | ICD-10-CM

## 2020-01-08 DIAGNOSIS — Z7982 Long term (current) use of aspirin: Secondary | ICD-10-CM | POA: Insufficient documentation

## 2020-01-08 DIAGNOSIS — F319 Bipolar disorder, unspecified: Secondary | ICD-10-CM | POA: Insufficient documentation

## 2020-01-08 DIAGNOSIS — Z720 Tobacco use: Secondary | ICD-10-CM

## 2020-01-08 DIAGNOSIS — I5022 Chronic systolic (congestive) heart failure: Secondary | ICD-10-CM

## 2020-01-08 DIAGNOSIS — Z888 Allergy status to other drugs, medicaments and biological substances status: Secondary | ICD-10-CM | POA: Insufficient documentation

## 2020-01-08 DIAGNOSIS — Z8249 Family history of ischemic heart disease and other diseases of the circulatory system: Secondary | ICD-10-CM | POA: Insufficient documentation

## 2020-01-08 DIAGNOSIS — F419 Anxiety disorder, unspecified: Secondary | ICD-10-CM

## 2020-01-08 DIAGNOSIS — Z881 Allergy status to other antibiotic agents status: Secondary | ICD-10-CM | POA: Insufficient documentation

## 2020-01-08 DIAGNOSIS — Z885 Allergy status to narcotic agent status: Secondary | ICD-10-CM | POA: Insufficient documentation

## 2020-01-08 DIAGNOSIS — Z880 Allergy status to penicillin: Secondary | ICD-10-CM | POA: Insufficient documentation

## 2020-01-08 DIAGNOSIS — F17210 Nicotine dependence, cigarettes, uncomplicated: Secondary | ICD-10-CM | POA: Insufficient documentation

## 2020-01-08 DIAGNOSIS — Z79899 Other long term (current) drug therapy: Secondary | ICD-10-CM | POA: Insufficient documentation

## 2020-01-08 DIAGNOSIS — I110 Hypertensive heart disease with heart failure: Secondary | ICD-10-CM | POA: Insufficient documentation

## 2020-01-08 LAB — BASIC METABOLIC PANEL
Anion gap: 8 (ref 5–15)
BUN: 12 mg/dL (ref 6–20)
CO2: 24 mmol/L (ref 22–32)
Calcium: 9.3 mg/dL (ref 8.9–10.3)
Chloride: 107 mmol/L (ref 98–111)
Creatinine, Ser: 0.86 mg/dL (ref 0.61–1.24)
GFR calc Af Amer: >60 mL/min (ref >60)
GFR calc non Af Amer: >60 mL/min (ref >60)
Glucose, Bld: 99 mg/dL (ref 70–99)
Potassium: 4.4 mmol/L (ref 3.5–5.1)
Sodium: 139 mmol/L (ref 135–145)

## 2020-01-08 LAB — MAGNESIUM: Magnesium: 2.3 mg/dL (ref 1.7–2.4)

## 2020-01-08 NOTE — Patient Instructions (Addendum)
Continue weighing daily and call for an overnight weight gain of > 2 pounds or a weekly weight gain of >5 pounds.   Call [name] at [facility] to ask about if you can write a letter saying you don't have any income and that your mom bought your house.

## 2020-02-12 ENCOUNTER — Telehealth (HOSPITAL_COMMUNITY): Payer: Self-pay

## 2020-02-12 NOTE — Telephone Encounter (Signed)
357 lbs.  Today had a telephone visit with Marc [name].  He states doing ok, his swelling is down, weight has come down a couple of lbs.  He has appt next week for his disabilility, hopefully it will go through.  He states he wants to go out and do a little job but gets exerted easily.  He states would like to do PT or something to get his breathing improved with walking.  Advised to start off slow walking and add further distances.  He states without insurance he is limited to programs.  Will check with Cardiac rehab to see if they can get him in.  He states has all his medications, [name] is filling them for him.  He is aware of up coming appts. He states watching what he eats but slides sometimes.  He watches his fluid intake, keeps around 60 ounces.  He was a little depressed when first started talking but then we started talking about the improvements and how far he has come and he perked up some.  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  We talked awhile til he was feeling better.  He weighs daily.  Will continue to visit for heart failure.   [place] [phone]

## 2020-03-14 ENCOUNTER — Telehealth (HOSPITAL_COMMUNITY): Payer: Self-pay

## 2020-03-14 NOTE — Telephone Encounter (Signed)
Weight 359 lbs.  Today had a telephone appt with Khayri.  He states has court date for his disability in August, hopefully he will get approved.  He has no income and been depending on his sister for help.  He is established at [facility] and gets his medications there at [name].  He has been going to his appts. He has all his medications and aware of how to take them.  Verified them.  He states bad for not weighing everyday but a few times a week.  Last weight he states was 359 lbs.  He states weight goes up and down.  Explained importance of weighing daily.  He denies any chest pain, shortness of breath, headaches or dizziness.  He states his feet has been hurting, he states will call his podiatry to see how much it will cost for a visit.  He states if too expensive will call [name] for appt.  He states legs are down this morning, he states a couple of days ago had some fluid but went down.  He tries to elevate when sitting.  Sleep has been off because he took his fluid pills a little later than his norma.  Urinating good.  Watching high sodium foods and watching how much fluids he in drinking.  He is aware of up coming appt in August with HF clinic and will remind when get closer.  Will continue to visit for heart failure.   [place] [phone]

## 2020-04-08 NOTE — Progress Notes (Signed)
Patient ID: Marc [name], male    DOB: [birth year], 44 y.o.   MRN: [id number]  HPI  Marc [name] is a 44 y/o male with a history of HTN, chronic pain, bipolar, current tobacco use and chronic heart failure.   Echo report from 10/10/2018 reviewed and showed an EF of 20-25% along with moderate Marc/TR.   Has not been admitted or been in the ED in the last 6 months.   He presents today for a follow-up visit with a chief complaint of moderate fatigue upon minimal exertion. He describes this as chronic in nature having been present for several years. He has associated shortness of breath, headaches, light-headedness, intermittent chest pain, pedal edema, palpitations, depression, anxiety and chronic pain along with this. He denies any difficulty sleeping, abdominal distention, cough or weight gain.   Went to court for his disability claim and is currently to hear about the results.   Past Medical History:  Diagnosis Date  . Bipolar affective ([place])   . CHF (congestive heart failure) ([place])   . Hypertension    Past Surgical History:  Procedure Laterality Date  . BICEPS TENDON REPAIR    . FOOT SURGERY     metataral and fasciotomy  . ROTATOR CUFF REPAIR     Family History  Problem Relation Age of Onset  . Stroke Mother   . Dementia Mother   . Hypertension Father   . Diabetes Father    Social History   Tobacco Use  . Smoking status: Current Every Day Smoker    Packs/day: 0.50    Years: 25.00    Pack years: 12.50    Types: Cigarettes  . Smokeless tobacco: Never Used  Substance Use Topics  . Alcohol use: Not Currently   Allergies  Allergen Reactions  . Geodon [Ziprasidone] Other (See Comments)    paralysis  . Tramadol Other (See Comments)    Pt stated that it gave him sores.  . Amoxicillin Rash  . Zithromax [Azithromycin] Rash   Prior to Admission medications   Medication Sig Start Date End Date Taking? Authorizing Provider  albuterol (VENTOLIN HFA) 108 (90 Base) MCG/ACT  inhaler Inhale 2 puffs into the lungs every 6 (six) hours as needed for up to 30 days for wheezing or shortness of breath. 02/03/19 04/09/20 Yes Sudini, Alveta Heimlich, MD  aspirin EC 81 MG tablet Take 81 mg by mouth daily.   Yes [provider]  carvedilol (COREG) 3.125 MG tablet Take 1 tablet (3.125 mg total) by mouth 2 (two) times daily with a meal. 03/01/19 04/09/20 Yes Gerica Koble A, FNP  cyclobenzaprine (FLEXERIL) 5 MG tablet Take 10 mg by mouth 3 (three) times daily as needed for muscle spasms.   Yes [provider]  lidocaine (LIDODERM) 5 % Place 1 patch onto the skin daily. Remove & Discard patch within 12 hours or as directed by MD   Yes [provider]  sacubitril-valsartan (ENTRESTO) 97-103 MG Take 1 tablet by mouth 2 (two) times daily. 11/10/19  Yes Darylene Price A, FNP  spironolactone (ALDACTONE) 25 MG tablet Take 1 tablet (25 mg total) by mouth daily. 10/27/19 04/09/20 Yes Delisia Mcquiston, Otila Kluver A, FNP  torsemide (DEMADEX) 20 MG tablet Take 40 mg by mouth 2 (two) times daily.   Yes [provider]    Review of Systems  Constitutional: Positive for fatigue (tired "all the time"). Negative for appetite change.  HENT: Negative for congestion, postnasal drip and sore throat.   Eyes: Negative.  Respiratory: Positive for shortness of breath. Negative for cough.   Cardiovascular: Positive for chest pain (occasionally), palpitations (at times) and leg swelling.  Gastrointestinal: Negative for abdominal distention and abdominal pain.  Endocrine: Negative.   Genitourinary: Negative.   Musculoskeletal: Positive for arthralgias (feet pain), back pain, myalgias ("muscle cramps") and neck pain.  Skin: Negative.   Allergic/Immunologic: Negative.   Neurological: Positive for light-headedness, numbness (down legs at time) and headaches. Negative for dizziness.  Hematological: Negative for adenopathy. Does not bruise/bleed easily.  Psychiatric/Behavioral: Positive for dysphoric  mood (with financial matters). Negative for sleep disturbance (wearing CPAP). The patient is nervous/anxious.    Vitals:   04/09/20 0826  BP: (!) 143/91  Pulse: (!) 105  Resp: 18  SpO2: 99%  Weight: (!) 355 lb 4 oz (161.1 kg)  Height: 5\' 9"  (1.753 m)   Wt Readings from Last 3 Encounters:  04/09/20 (!) 355 lb 4 oz (161.1 kg)  01/08/20 (!) 359 lb 2 oz (162.9 kg)  12/11/19 (!) 365 lb 4 oz (165.7 kg)   Lab Results  Component Value Date   CREATININE 0.86 01/08/2020   CREATININE 0.87 11/10/2019   CREATININE 0.97 10/19/2019     Physical Exam Vitals and nursing note reviewed.  HENT:     Head: Normocephalic and atraumatic.  Cardiovascular:     Rate and Rhythm: Normal rate and regular rhythm.  Pulmonary:     Effort: Pulmonary effort is normal.     Breath sounds: No wheezing or rales.     Comments: Diminished due to chest wall size Abdominal:     General: There is no distension.     Palpations: Abdomen is soft.     Tenderness: There is no abdominal tenderness.  Musculoskeletal:        General: No tenderness.     Cervical back: Normal range of motion and neck supple.     Right lower leg: No tenderness. No edema.     Left lower leg: No tenderness. No edema.  Skin:    General: Skin is warm and dry.  Neurological:     General: No focal deficit present.     Mental Status: He is alert and oriented to person, place, and time.  Psychiatric:        Attention and Perception: Attention normal.        Mood and Affect: Mood is anxious. Mood is not depressed.        Behavior: Behavior normal.     Assessment & Plan:  1. Chronic heart failure with reduced ejection fraction- - NYHA class III - euvolemic today - not weighing daily; reminded to get back in the habit to weigh daily and to call for an overnight weight gain of >2 pounds or a weekly weight gain of >5 pounds - weight down 4 pounds from last visit here 3 months ago - admits to not being very active because of his sciatica  pain - not adding salt to his food and has been reading food labels - saw cardiology Marc [name]) 02/09/2019 but is unable to return due to financial difficulty - BNP 10/19/19 was 101.0 - participating in paramedicine program - wearing cpap at night - has received both COVID vaccines  2: HTN- - BP mildly elevated but he says that he hasn't taken any of his medications yet today because he hasn't eaten breakfast yet - saw PCP at [facility] mid April 2021 - BMP from 01/08/20 reviewed and showed sodium 139, potassium  4.4, creatinine 0.86 and GFR >60  3: Anxiety/ depression- - patient says that he's talked to the therapist at [name] - had disability court date 04/01/20  4: Tobacco use- - smoking ~ 1/2 ppd of cigarettes - denies alcohol or drug use - not interested in stopping completely at this time - complete cessation discussed for 3 minutes with the patient.     Patient did not bring his medications nor a list. Each medication was verbally reviewed with the patient and he was encouraged to bring the bottles to every visit to confirm accuracy of list.  Return in 4 months or sooner for any questions/ptoblems before then.

## 2020-04-09 ENCOUNTER — Ambulatory Visit: Payer: Self-pay | Attending: Family | Admitting: Family

## 2020-04-09 ENCOUNTER — Encounter: Payer: Self-pay | Admitting: Family

## 2020-04-09 ENCOUNTER — Other Ambulatory Visit: Payer: Self-pay

## 2020-04-09 VITALS — BP 143/91 | HR 105 | Resp 18 | Ht 69.0 in | Wt 355.2 lb

## 2020-04-09 DIAGNOSIS — I10 Essential (primary) hypertension: Secondary | ICD-10-CM

## 2020-04-09 DIAGNOSIS — Z79899 Other long term (current) drug therapy: Secondary | ICD-10-CM | POA: Insufficient documentation

## 2020-04-09 DIAGNOSIS — F319 Bipolar disorder, unspecified: Secondary | ICD-10-CM | POA: Insufficient documentation

## 2020-04-09 DIAGNOSIS — F32A Depression, unspecified: Secondary | ICD-10-CM

## 2020-04-09 DIAGNOSIS — Z7982 Long term (current) use of aspirin: Secondary | ICD-10-CM | POA: Insufficient documentation

## 2020-04-09 DIAGNOSIS — F419 Anxiety disorder, unspecified: Secondary | ICD-10-CM | POA: Insufficient documentation

## 2020-04-09 DIAGNOSIS — Z8249 Family history of ischemic heart disease and other diseases of the circulatory system: Secondary | ICD-10-CM | POA: Insufficient documentation

## 2020-04-09 DIAGNOSIS — Z880 Allergy status to penicillin: Secondary | ICD-10-CM | POA: Insufficient documentation

## 2020-04-09 DIAGNOSIS — I5022 Chronic systolic (congestive) heart failure: Secondary | ICD-10-CM | POA: Insufficient documentation

## 2020-04-09 DIAGNOSIS — I110 Hypertensive heart disease with heart failure: Secondary | ICD-10-CM | POA: Insufficient documentation

## 2020-04-09 DIAGNOSIS — G8929 Other chronic pain: Secondary | ICD-10-CM | POA: Insufficient documentation

## 2020-04-09 DIAGNOSIS — F17210 Nicotine dependence, cigarettes, uncomplicated: Secondary | ICD-10-CM | POA: Insufficient documentation

## 2020-04-09 DIAGNOSIS — Z881 Allergy status to other antibiotic agents status: Secondary | ICD-10-CM | POA: Insufficient documentation

## 2020-04-09 DIAGNOSIS — Z720 Tobacco use: Secondary | ICD-10-CM

## 2020-04-09 DIAGNOSIS — Z885 Allergy status to narcotic agent status: Secondary | ICD-10-CM | POA: Insufficient documentation

## 2020-04-09 NOTE — Patient Instructions (Signed)
Continue weighing daily and call for an overnight weight gain of > 2 pounds or a weekly weight gain of >5 pounds. 

## 2020-04-16 ENCOUNTER — Telehealth (HOSPITAL_COMMUNITY): Payer: Self-pay

## 2020-04-16 NOTE — Telephone Encounter (Signed)
Had a telephone appt with Marc [name] today.  He sates doing good except for pain in his feet.  He does have a foot doctor now and using medication that is helping.  He has been approved for disability now and he is relieved with that news.  He states he will be able to go to the doctors he wants to and get the care he needs.  He is not weighing but he states he has no more swelling than his normal.  He denies chest pain, shortness of breath, headaches or dizziness.  He has all his medications and aware of how to take them.  He watches high sodium foods and watches how much fluid he is in taking.  Discussed importance of weighing and getting in to a habit of it.  He states he will try to start and write it down.  He is aware of up coming appts.  Will continue to visit for heart failure.   Marc [name] [phone]

## 2020-06-11 ENCOUNTER — Telehealth (HOSPITAL_COMMUNITY): Payer: Self-pay

## 2020-06-11 NOTE — Telephone Encounter (Signed)
Have attempted to reach several times, left messages with no response.  Will continue to try.   [place] [phone]

## 2020-06-13 ENCOUNTER — Telehealth (HOSPITAL_COMMUNITY): Payer: Self-pay

## 2020-06-13 NOTE — Telephone Encounter (Signed)
Had a telephone visit with [facility] today.  He states been feeling well and doing ok as far as heart failure.  He has not been weighing daily but feels like his pants are looser.  He states will weigh today.  He has been approved for disability and has more money to buy healthier foods.  He is watching his diet and has quit soft drinks.  He is drinking water and gatorade now.  He has all his medications, just filled out paper work for [employer] and will sending it back.  He is making appt for his PCP today about his feet, they are contueing to give him problems.  He denies fluids in lower extremities or abdomen feeling tight.  He states did have gout and the medications make his stomach cramp.  He states eat smaller meals and more often due to the medications.  He denies any chest pain or shortness of breath.  He is aware of HF appt in December.  He has everything for daily living.  Will continue to visit for heart failure.   [place] [phone]

## 2020-07-24 ENCOUNTER — Other Ambulatory Visit: Payer: Self-pay | Admitting: Family

## 2020-08-01 ENCOUNTER — Ambulatory Visit: Payer: Self-pay | Admitting: Family

## 2020-08-04 NOTE — Progress Notes (Deleted)
Patient ID: Marc [name], male    DOB: [birth year], 44 y.o.   MRN: [id number]  HPI  [name] is a 44 y/o male with a history of HTN, chronic pain, bipolar, current tobacco use and chronic heart failure.   Echo report from 10/10/2018 reviewed and showed an EF of 20-25% along with moderate MR/TR.   Was in the ED 05/20/20 due to acute gout of left ankle where he was evaluated and released.   He presents today for a follow-up visit with a chief complaint of   Past Medical History:  Diagnosis Date  . Bipolar affective ([place])   . CHF (congestive heart failure) ([place])   . Hypertension    Past Surgical History:  Procedure Laterality Date  . BICEPS TENDON REPAIR    . FOOT SURGERY     metataral and fasciotomy  . ROTATOR CUFF REPAIR     Family History  Problem Relation Age of Onset  . Stroke Mother   . Dementia Mother   . Hypertension Father   . Diabetes Father    Social History   Tobacco Use  . Smoking status: Current Every Day Smoker    Packs/day: 0.50    Years: 25.00    Pack years: 12.50    Types: Cigarettes  . Smokeless tobacco: Never Used  Substance Use Topics  . Alcohol use: Not Currently   Allergies  Allergen Reactions  . Geodon [Ziprasidone] Other (See Comments)    paralysis  . Tramadol Other (See Comments)    Pt stated that it gave him sores.  . Amoxicillin Rash  . Zithromax [Azithromycin] Rash     Review of Systems  Constitutional: Positive for fatigue (tired "all the time"). Negative for appetite change.  HENT: Negative for congestion, postnasal drip and sore throat.   Eyes: Negative.   Respiratory: Positive for shortness of breath. Negative for cough.   Cardiovascular: Positive for chest pain (occasionally), palpitations (at times) and leg swelling.  Gastrointestinal: Negative for abdominal distention and abdominal pain.  Endocrine: Negative.   Genitourinary: Negative.   Musculoskeletal: Positive for arthralgias (feet pain), back pain, myalgias  ("muscle cramps") and neck pain.  Skin: Negative.   Allergic/Immunologic: Negative.   Neurological: Positive for light-headedness, numbness (down legs at time) and headaches. Negative for dizziness.  Hematological: Negative for adenopathy. Does not bruise/bleed easily.  Psychiatric/Behavioral: Positive for dysphoric mood (with financial matters). Negative for sleep disturbance (wearing CPAP). The patient is nervous/anxious.       Physical Exam Vitals and nursing note reviewed.  HENT:     Head: Normocephalic and atraumatic.  Cardiovascular:     Rate and Rhythm: Normal rate and regular rhythm.  Pulmonary:     Effort: Pulmonary effort is normal.     Breath sounds: No wheezing or rales.     Comments: Diminished due to chest wall size Abdominal:     General: There is no distension.     Palpations: Abdomen is soft.     Tenderness: There is no abdominal tenderness.  Musculoskeletal:        General: No tenderness.     Cervical back: Normal range of motion and neck supple.     Right lower leg: No tenderness. No edema.     Left lower leg: No tenderness. No edema.  Skin:    General: Skin is warm and dry.  Neurological:     General: No focal deficit present.     Mental Status: He is alert and oriented to  person, place, and time.  Psychiatric:        Attention and Perception: Attention normal.        Mood and Affect: Mood is anxious. Mood is not depressed.        Behavior: Behavior normal.     Assessment & Plan:  1. Chronic heart failure with reduced ejection fraction- - NYHA class III - euvolemic today - not weighing daily; reminded to get back in the habit to weigh daily and to call for an overnight weight gain of >2 pounds or a weekly weight gain of >5 pounds - weight 355.4 pounds from last visit here 3 months ago - admits to not being very active because of his sciatica pain - not adding salt to his food and has been reading food labels - saw cardiology Clayborn Bigness) 02/09/2019  but is unable to return due to financial difficulty - BNP 10/19/19 was 101.0 - participating in paramedicine program - wearing cpap at night - has received both COVID vaccines  2: HTN- - BP  - saw PCP at [facility] mid April 2021 - BMP from 01/08/20 reviewed and showed sodium 139, potassium 4.4, creatinine 0.86 and GFR >60  3: Anxiety/ depression- - patient says that he's talked to the therapist at [name] - had disability court date 04/01/20  4: Tobacco use- - smoking ~ 1/2 ppd of cigarettes - denies alcohol or drug use - not interested in stopping completely at this time - complete cessation discussed for 3 minutes with the patient.     Patient did not bring his medications nor a list. Each medication was verbally reviewed with the patient and he was encouraged to bring the bottles to every visit to confirm accuracy of list.

## 2020-08-05 ENCOUNTER — Ambulatory Visit: Payer: Self-pay | Admitting: Family

## 2020-08-12 ENCOUNTER — Ambulatory Visit: Payer: Medicare Other | Attending: Family | Admitting: Family

## 2020-08-12 ENCOUNTER — Other Ambulatory Visit: Payer: Self-pay

## 2020-08-12 ENCOUNTER — Encounter: Payer: Self-pay | Admitting: Family

## 2020-08-12 VITALS — BP 141/93 | HR 112 | Resp 20 | Ht 69.0 in | Wt 349.1 lb

## 2020-08-12 DIAGNOSIS — F329 Major depressive disorder, single episode, unspecified: Secondary | ICD-10-CM | POA: Insufficient documentation

## 2020-08-12 DIAGNOSIS — F419 Anxiety disorder, unspecified: Secondary | ICD-10-CM | POA: Insufficient documentation

## 2020-08-12 DIAGNOSIS — I509 Heart failure, unspecified: Secondary | ICD-10-CM | POA: Diagnosis not present

## 2020-08-12 DIAGNOSIS — Z7982 Long term (current) use of aspirin: Secondary | ICD-10-CM | POA: Insufficient documentation

## 2020-08-12 DIAGNOSIS — I110 Hypertensive heart disease with heart failure: Secondary | ICD-10-CM | POA: Diagnosis not present

## 2020-08-12 DIAGNOSIS — G8929 Other chronic pain: Secondary | ICD-10-CM | POA: Insufficient documentation

## 2020-08-12 DIAGNOSIS — Z79899 Other long term (current) drug therapy: Secondary | ICD-10-CM | POA: Insufficient documentation

## 2020-08-12 DIAGNOSIS — F17210 Nicotine dependence, cigarettes, uncomplicated: Secondary | ICD-10-CM | POA: Diagnosis not present

## 2020-08-12 DIAGNOSIS — F32A Depression, unspecified: Secondary | ICD-10-CM

## 2020-08-12 DIAGNOSIS — Z8249 Family history of ischemic heart disease and other diseases of the circulatory system: Secondary | ICD-10-CM | POA: Insufficient documentation

## 2020-08-12 DIAGNOSIS — Z720 Tobacco use: Secondary | ICD-10-CM

## 2020-08-12 DIAGNOSIS — I5022 Chronic systolic (congestive) heart failure: Secondary | ICD-10-CM

## 2020-08-12 DIAGNOSIS — I10 Essential (primary) hypertension: Secondary | ICD-10-CM

## 2020-08-12 MED ORDER — CARVEDILOL 6.25 MG PO TABS: 6.2500 mg | ORAL_TABLET | Freq: Two times a day (BID) | ORAL | 3 refills | Status: DC

## 2020-08-12 NOTE — Progress Notes (Signed)
Patient ID: Marc [name], male    DOB: April 25, 1976, 44 y.o.   MRN: [id number]  HPI  Marc [name] is a 44 y/o male with a history of HTN, chronic pain, bipolar, current tobacco use and chronic heart failure.   Echo report from 10/10/2018 reviewed and showed an EF of 20-25% along with moderate Marc/TR.   Was in the ED 05/20/20 due to acute gout of left ankle where he was evaluated and released.   He presents today for a follow-up visit with a chief complaint of moderate fatigue upon minimal exertion. He describes this as chronic in nature having been present for several years. He has associated shortness of breath, intermittent chest pain, light-headedness, headache, pedal edema, depression and chronic pain along with this. He denies any abdominal distention, palpitations, cough or weight gain.   Continues to have some issues with gout pain in his foot. Would like to get re-established with cardiology after the first of the year since he now has insurance.   Past Medical History:  Diagnosis Date  . Bipolar affective ([place])   . CHF (congestive heart failure) ([place])   . Hypertension    Past Surgical History:  Procedure Laterality Date  . BICEPS TENDON REPAIR    . FOOT SURGERY     metataral and fasciotomy  . ROTATOR CUFF REPAIR     Family History  Problem Relation Age of Onset  . Stroke Mother   . Dementia Mother   . Hypertension Father   . Diabetes Father    Social History   Tobacco Use  . Smoking status: Current Every Day Smoker    Packs/day: 0.50    Years: 25.00    Pack years: 12.50    Types: Cigarettes  . Smokeless tobacco: Never Used  Substance Use Topics  . Alcohol use: Not Currently   Allergies  Allergen Reactions  . Geodon [Ziprasidone] Other (See Comments)    paralysis  . Tramadol Other (See Comments)    Pt stated that it gave him sores.  . Amoxicillin Rash  . Zithromax [Azithromycin] Rash   Prior to Admission medications   Medication Sig Start Date End Date  Taking? Authorizing Provider  albuterol (VENTOLIN HFA) 108 (90 Base) MCG/ACT inhaler Inhale 2 puffs into the lungs every 6 (six) hours as needed for up to 30 days for wheezing or shortness of breath. 02/03/19 04/16/20 Yes Sudini, Alveta Heimlich, MD  aspirin EC 81 MG tablet Take 81 mg by mouth daily.   Yes [provider]  carvedilol (COREG) 3.125 MG tablet Take 1 tablet (3.125 mg total) by mouth 2 (two) times daily with a meal. 03/01/19 06/13/20 Yes Lujean Ebright A, FNP  cyclobenzaprine (FLEXERIL) 5 MG tablet Take 10 mg by mouth 3 (three) times daily as needed for muscle spasms.   Yes [provider]  lidocaine (LIDODERM) 5 % Place 1 patch onto the skin daily. Remove & Discard patch within 12 hours or as directed by MD   Yes [provider]  sacubitril-valsartan (ENTRESTO) 97-103 MG Take 1 tablet by mouth 2 (two) times daily. 11/10/19  Yes Darylene Price A, FNP  spironolactone (ALDACTONE) 25 MG tablet Take 1 tablet by mouth once daily 07/24/20  Yes Liliyana Thobe A, FNP  torsemide (DEMADEX) 20 MG tablet Take 40 mg by mouth 2 (two) times daily.   Yes [provider]     Review of Systems  Constitutional: Positive for fatigue (tired "all the time"). Negative for appetite change.  HENT:  Negative for congestion, postnasal drip and sore throat.   Eyes: Negative.   Respiratory: Positive for shortness of breath. Negative for cough.   Cardiovascular: Positive for chest pain (occasionally) and leg swelling. Negative for palpitations.  Gastrointestinal: Negative for abdominal distention and abdominal pain.  Endocrine: Negative.   Genitourinary: Negative.   Musculoskeletal: Positive for arthralgias (left ankle), back pain and neck pain.  Skin: Negative.   Allergic/Immunologic: Negative.   Neurological: Positive for light-headedness, numbness (down legs at time) and headaches. Negative for dizziness.  Hematological: Negative for adenopathy. Does not bruise/bleed easily.   Psychiatric/Behavioral: Positive for dysphoric mood. Negative for sleep disturbance (wearing CPAP). The patient is nervous/anxious.    Vitals:   08/12/20 1056  BP: (!) 141/93  Pulse: (!) 112  Resp: 20  SpO2: 98%  Weight: (!) 349 lb 2 oz (158.4 kg)  Height: 5\' 9"  (1.753 m)   Wt Readings from Last 3 Encounters:  08/12/20 (!) 349 lb 2 oz (158.4 kg)  04/09/20 (!) 355 lb 4 oz (161.1 kg)  01/08/20 (!) 359 lb 2 oz (162.9 kg)   Lab Results  Component Value Date   CREATININE 0.86 01/08/2020   CREATININE 0.87 11/10/2019   CREATININE 0.97 10/19/2019     Physical Exam Vitals and nursing note reviewed.  HENT:     Head: Normocephalic and atraumatic.  Cardiovascular:     Rate and Rhythm: Regular rhythm. Tachycardia present.  Pulmonary:     Effort: Pulmonary effort is normal.     Breath sounds: No wheezing or rales.     Comments: Diminished due to chest wall size Abdominal:     General: There is no distension.     Palpations: Abdomen is soft.     Tenderness: There is no abdominal tenderness.  Musculoskeletal:        General: No tenderness.     Cervical back: Normal range of motion and neck supple.     Right lower leg: No tenderness. No edema.     Left lower leg: No tenderness. No edema.  Skin:    General: Skin is warm and dry.  Neurological:     General: No focal deficit present.     Mental Status: He is alert and oriented to person, place, and time.  Psychiatric:        Attention and Perception: Attention normal.        Mood and Affect: Mood is anxious. Mood is not depressed.        Behavior: Behavior normal.     Assessment & Plan:  1. Chronic heart failure with reduced ejection fraction- - NYHA class III - euvolemic today - weighing daily; reminded to call for an overnight weight gain of >2 pounds or a weekly weight gain of >5 pounds - weight down 6 pounds from last visit here 3 months ago - admits to not being very active because of his sciatica pain - not adding  salt to his food and has been reading food labels - saw cardiology Clayborn Bigness) 02/09/2019; now that his insurance is in place, he'd like to get re-established after the first of the year. Attempted to call Dr. Etta Quill office but unable to get through so [phone] was given to patient for him to call - will increase his carvedilol to 6.25mg  BID; advised patient to finish his current bottle by taking 2 tablets BID until gone and then when he picks up the new RX, he will go back to taking 1 tablet BID of the  6.25mg  dose - patient verbalized understanding - discussed adding farxiga or jardiance at next visit - BNP 10/19/19 was 101.0 - participating in paramedicine program - wearing cpap at night although has to get an updated sleep study completed  - has received both COVID vaccines  2: HTN- - BP mildly elevated - saw PCP at [facility] mid April 2021 - BMP from 01/08/20 reviewed and showed sodium 139, potassium 4.4, creatinine 0.86 and GFR >60  3: Anxiety/ depression- - patient says that he's talked to the therapist at [facility] - disability has been approved and he now has his insurance in place  4: Tobacco use- - smoking ~ 1/2 ppd of cigarettes - denies alcohol or drug use - not interested in stopping completely at this time - complete cessation discussed for 3 minutes with the patient.    Patient did not bring his medications nor a list. Each medication was verbally reviewed with the patient and he was encouraged to bring the bottles to every visit to confirm accuracy of list.  Return in 1 month or sooner for any questions/problems before then.

## 2020-08-12 NOTE — Patient Instructions (Addendum)
Continue weighing daily and call for an overnight weight gain of > 2 pounds or a weekly weight gain of >5 pounds.   Finish your current carvedilol dose (3.125mg ) by taking 2 tablets twice daily.    Once you finish that bottle, begin the 6.25mg  dose and you will then take 1 tablet twice daily.      Call Dr. Clayborn Bigness office to schedule appointment [phone]

## 2020-09-09 NOTE — Progress Notes (Signed)
Patient ID: Marc [name], male    DOB: [birth year], 45 y.o.   MRN: [id number]  HPI  Mr Sobalvarro is a 45 y/o male with a history of HTN, chronic pain, bipolar, current tobacco use and chronic heart failure.   Echo report from 10/10/2018 reviewed and showed an EF of 20-25% along with moderate MR/TR.   Was in the ED 05/20/20 due to acute gout of left ankle where he was evaluated and released.   He presents today for a follow-up visit with a chief complaint of constant chest pain since last week. He says that he developed sharp chest pain last week in his left upper chest. He then had over the next few days nausea, diarrhea, left arm pain, chest pain radiating up the left side of his neck, numbness in his fingertips, pedal edema and anxiety along with this. He denied any abdominal distention, palpitations, dizziness, cough or weight gain.   He missed his cardiology appointment because he couldn't get out of his driveway because of the snow. He didn't call 911 because he didn't feel like an ambulance would be able to get to him and then he saw where an ambulance went into a ditch because of the snow and he decided that he was going to take his chances and just stay home.   Today he says that he still has chest pain although the severity has lessened and he no longer has any GI symptoms or any numbness in his fingertips. Admits to being very anxious about this and is convinced that he had a heart attack last week because of his symptoms.   Past Medical History:  Diagnosis Date  . Bipolar affective ([facility])   . CHF (congestive heart failure) ([facility])   . Hypertension    Past Surgical History:  Procedure Laterality Date  . BICEPS TENDON REPAIR    . FOOT SURGERY     metataral and fasciotomy  . ROTATOR CUFF REPAIR     Family History  Problem Relation Age of Onset  . Stroke Mother   . Dementia Mother   . Hypertension Father   . Diabetes Father    Social History   Tobacco Use  . Smoking  status: Current Every Day Smoker    Packs/day: 0.50    Years: 25.00    Pack years: 12.50    Types: Cigarettes  . Smokeless tobacco: Never Used  Substance Use Topics  . Alcohol use: Not Currently   Allergies  Allergen Reactions  . Geodon [Ziprasidone] Other (See Comments)    paralysis  . Tramadol Other (See Comments)    Pt stated that it gave him sores.  . Amoxicillin Rash  . Zithromax [Azithromycin] Rash   Prior to Admission medications   Medication Sig Start Date End Date Taking? Authorizing Provider  albuterol (VENTOLIN HFA) 108 (90 Base) MCG/ACT inhaler Inhale 2 puffs into the lungs every 6 (six) hours as needed for up to 30 days for wheezing or shortness of breath. 02/03/19 04/16/20 Yes Milagros Loll, MD  allopurinol (ZYLOPRIM) 100 MG tablet Take 1 tablet by mouth once a day  for gout 08/29/20  Yes [provider]  aspirin EC 81 MG tablet Take 81 mg by mouth daily.   Yes [provider]  carvedilol (COREG) 6.25 MG tablet Take 1 tablet (6.25 mg total) by mouth 2 (two) times daily. 08/12/20  Yes Clarisa Kindred A, FNP  colchicine 0.6 MG tablet Take by mouth. 05/20/20 05/20/21 Yes [provider]  cyclobenzaprine (FLEXERIL) 5 MG tablet Take 10 mg by mouth 3 (three) times daily as needed for muscle spasms.   Yes [provider]  fluconazole (DIFLUCAN) 50 MG tablet Take 50 mg by mouth daily.   Yes [provider]  ibuprofen (ADVIL) 600 MG tablet Take by mouth. 05/20/20  Yes [provider]  lidocaine (LIDODERM) 5 % Place 1 patch onto the skin daily. Remove & Discard patch within 12 hours or as directed by MD   Yes [provider]  oxyCODONE-acetaminophen (PERCOCET/ROXICET) 5-325 MG tablet SMARTSIG:1 By Mouth 4-5 Times Daily 05/20/20  Yes [provider]  sacubitril-valsartan (ENTRESTO) 97-103 MG Take 1 tablet by mouth 2 (two) times daily. 11/10/19  Yes Darylene Price A, FNP  spironolactone (ALDACTONE) 25 MG tablet Take 1  tablet by mouth once daily 07/24/20  Yes Mattingly Fountaine A, FNP  torsemide (DEMADEX) 20 MG tablet Take 40 mg by mouth 2 (two) times daily.   Yes [provider]     Review of Systems  Constitutional: Positive for appetite change (decreased) and fatigue (tired "all the time").  HENT: Negative for congestion, postnasal drip and sore throat.   Eyes: Negative.   Respiratory: Positive for shortness of breath. Negative for cough.   Cardiovascular: Positive for chest pain (constant since last week; radiates into neck and left arm) and leg swelling. Negative for palpitations.  Gastrointestinal: Negative for abdominal distention, abdominal pain (last week), diarrhea (last week) and nausea (last week).  Endocrine: Negative.   Genitourinary: Negative.   Musculoskeletal: Positive for arthralgias (left arm), back pain and neck pain.  Skin: Negative.   Allergic/Immunologic: Negative.   Neurological: Positive for headaches. Negative for dizziness, light-headedness and numbness (in fingertips last week).  Hematological: Negative for adenopathy. Does not bruise/bleed easily.  Psychiatric/Behavioral: Positive for dysphoric mood. Negative for sleep disturbance (wearing CPAP). The patient is nervous/anxious.    Vitals:   09/10/20 1002  BP: 128/89  Pulse: 96  Resp: 20  SpO2: 98%  Weight: (!) 344 lb (156 kg)  Height: 5\' 9"  (1.753 m)   Wt Readings from Last 3 Encounters:  09/10/20 (!) 344 lb (156 kg)  08/12/20 (!) 349 lb 2 oz (158.4 kg)  04/09/20 (!) 355 lb 4 oz (161.1 kg)   Lab Results  Component Value Date   CREATININE 0.86 01/08/2020   CREATININE 0.87 11/10/2019   CREATININE 0.97 10/19/2019   Physical Exam Vitals and nursing note reviewed.  HENT:     Head: Normocephalic and atraumatic.  Cardiovascular:     Rate and Rhythm: Normal rate and regular rhythm.  Pulmonary:     Effort: Pulmonary effort is normal.     Breath sounds: No wheezing or rales.     Comments: Diminished due to  chest wall size Chest:     Chest wall: Tenderness (along left upper chest wall) present.  Abdominal:     General: There is no distension.     Palpations: Abdomen is soft.     Tenderness: There is no abdominal tenderness.  Musculoskeletal:        General: No tenderness.     Cervical back: Normal range of motion and neck supple.     Right lower leg: No tenderness. No edema.     Left lower leg: No tenderness. No edema.  Skin:    General: Skin is warm and dry.  Neurological:     General: No focal deficit present.     Mental Status: He is alert and oriented  to person, place, and time.  Psychiatric:        Attention and Perception: Attention normal.        Mood and Affect: Mood is anxious. Mood is not depressed.        Behavior: Behavior normal.     Assessment & Plan:  1. Chronic heart failure with reduced ejection fraction- - NYHA class III - euvolemic today - weighing daily; reminded to call for an overnight weight gain of >2 pounds or a weekly weight gain of >5 pounds - weight down 5 pounds from last visit here 1 month ago - admits to not being very active because of his sciatica pain - not adding salt to his food and has been reading food labels - saw cardiology Clayborn Bigness) 02/09/2019; missed his f/u with him because of the weather and doesn't have it rescheduled yet - carvedilol increased to 6.25mg  BID at last visit - BNP 10/19/19 was 101.0 - participating in paramedicine program - wearing cpap at night although has to get an updated sleep study completed  - has received all 3 COVID vaccines  2: HTN- - BP looks good today - saw PCP at [facility] last week - BMP from 01/08/20 reviewed and showed sodium 139, potassium 4.4, creatinine 0.86 and GFR >60  3: Anxiety/ depression- - patient says that he's talked to the therapist at [place] has been approved and he now has his insurance in place  4: Tobacco use- - smoking ~ 1/2 ppd of  cigarettes - denies alcohol or drug use - not interested in stopping completely at this time - complete cessation discussed for 3 minutes with the patient.   5: Chest pain- - pain has been present since last week although severity has lessened - patient very anxious about the fact that he may have had a heart attack and would like to find out today if he actually had one or not - called Dr. Etta Quill office and the PA [facility]) can see patient today and we were told to send him over and she would see him; explained to patient that an EKG can be done and she may also do lab work - emphasized that irregardless of the weather, he should call 911 if this happens again and the ambulance will get to him - patient was taken to her office in a wheelchair by a volunteer   Patient did not bring his medications nor a list. Each medication was verbally reviewed with the patient and he was encouraged to bring the bottles to every visit to confirm accuracy of list.  Return in 3 months or sooner for any questions/problems before then.

## 2020-09-10 ENCOUNTER — Other Ambulatory Visit
Admission: RE | Admit: 2020-09-10 | Discharge: 2020-09-10 | Disposition: A | Discharge status: Home / Self Care | Payer: Medicare Other | Source: Ambulatory Visit | Attending: Student | Admitting: Student

## 2020-09-10 ENCOUNTER — Ambulatory Visit: Payer: Medicare Other | Admitting: Family

## 2020-09-10 ENCOUNTER — Other Ambulatory Visit: Payer: Self-pay

## 2020-09-10 ENCOUNTER — Encounter: Payer: Self-pay | Admitting: Family

## 2020-09-10 VITALS — BP 128/89 | HR 96 | Resp 20 | Ht 69.0 in | Wt 344.0 lb

## 2020-09-10 DIAGNOSIS — I5022 Chronic systolic (congestive) heart failure: Secondary | ICD-10-CM | POA: Insufficient documentation

## 2020-09-10 DIAGNOSIS — R079 Chest pain, unspecified: Secondary | ICD-10-CM | POA: Insufficient documentation

## 2020-09-10 DIAGNOSIS — Z885 Allergy status to narcotic agent status: Secondary | ICD-10-CM | POA: Insufficient documentation

## 2020-09-10 DIAGNOSIS — Z79899 Other long term (current) drug therapy: Secondary | ICD-10-CM | POA: Insufficient documentation

## 2020-09-10 DIAGNOSIS — I10 Essential (primary) hypertension: Secondary | ICD-10-CM

## 2020-09-10 DIAGNOSIS — Z7982 Long term (current) use of aspirin: Secondary | ICD-10-CM | POA: Insufficient documentation

## 2020-09-10 DIAGNOSIS — Z791 Long term (current) use of non-steroidal anti-inflammatories (NSAID): Secondary | ICD-10-CM | POA: Insufficient documentation

## 2020-09-10 DIAGNOSIS — Z8249 Family history of ischemic heart disease and other diseases of the circulatory system: Secondary | ICD-10-CM | POA: Insufficient documentation

## 2020-09-10 DIAGNOSIS — Z881 Allergy status to other antibiotic agents status: Secondary | ICD-10-CM | POA: Insufficient documentation

## 2020-09-10 DIAGNOSIS — Z888 Allergy status to other drugs, medicaments and biological substances status: Secondary | ICD-10-CM | POA: Insufficient documentation

## 2020-09-10 DIAGNOSIS — G8929 Other chronic pain: Secondary | ICD-10-CM | POA: Insufficient documentation

## 2020-09-10 DIAGNOSIS — I110 Hypertensive heart disease with heart failure: Secondary | ICD-10-CM | POA: Diagnosis not present

## 2020-09-10 DIAGNOSIS — F419 Anxiety disorder, unspecified: Secondary | ICD-10-CM | POA: Insufficient documentation

## 2020-09-10 DIAGNOSIS — Z79891 Long term (current) use of opiate analgesic: Secondary | ICD-10-CM | POA: Insufficient documentation

## 2020-09-10 DIAGNOSIS — F17210 Nicotine dependence, cigarettes, uncomplicated: Secondary | ICD-10-CM | POA: Insufficient documentation

## 2020-09-10 DIAGNOSIS — Z880 Allergy status to penicillin: Secondary | ICD-10-CM | POA: Insufficient documentation

## 2020-09-10 DIAGNOSIS — F319 Bipolar disorder, unspecified: Secondary | ICD-10-CM | POA: Insufficient documentation

## 2020-09-10 DIAGNOSIS — Z720 Tobacco use: Secondary | ICD-10-CM

## 2020-09-10 DIAGNOSIS — F32A Depression, unspecified: Secondary | ICD-10-CM

## 2020-09-10 DIAGNOSIS — Z01818 Encounter for other preprocedural examination: Secondary | ICD-10-CM | POA: Insufficient documentation

## 2020-09-10 LAB — BRAIN NATRIURETIC PEPTIDE: B Natriuretic Peptide: 44.1 pg/mL (ref 0.0–100.0)

## 2020-09-10 NOTE — Patient Instructions (Signed)
Continue weighing daily and call for an overnight weight gain of > 2 pounds or a weekly weight gain of >5 pounds. 

## 2020-09-11 ENCOUNTER — Telehealth: Payer: Self-pay

## 2020-09-11 ENCOUNTER — Other Ambulatory Visit: Payer: Self-pay

## 2020-09-11 ENCOUNTER — Encounter: Payer: Self-pay | Admitting: Gastroenterology

## 2020-09-11 ENCOUNTER — Ambulatory Visit (INDEPENDENT_AMBULATORY_CARE_PROVIDER_SITE_OTHER): Payer: Medicare Other | Admitting: Gastroenterology

## 2020-09-11 VITALS — BP 142/83 | HR 88 | Temp 98.7°F | Ht 69.0 in | Wt 349.0 lb

## 2020-09-11 DIAGNOSIS — R195 Other fecal abnormalities: Secondary | ICD-10-CM | POA: Diagnosis not present

## 2020-09-11 DIAGNOSIS — K625 Hemorrhage of anus and rectum: Secondary | ICD-10-CM

## 2020-09-11 DIAGNOSIS — K603 Anal fistula: Secondary | ICD-10-CM | POA: Diagnosis not present

## 2020-09-11 DIAGNOSIS — R194 Change in bowel habit: Secondary | ICD-10-CM

## 2020-09-11 MED ORDER — CIPROFLOXACIN HCL 500 MG PO TABS: 500.0000 mg | ORAL_TABLET | Freq: Two times a day (BID) | ORAL | 0 refills | Status: AC

## 2020-09-11 MED ORDER — NA SULFATE-K SULFATE-MG SULF 17.5-3.13-1.6 GM/177ML PO SOLN: 354.0000 mL | Freq: Once | ORAL | 0 refills | Status: AC

## 2020-09-11 MED ORDER — METRONIDAZOLE 500 MG PO TABS: 500.0000 mg | ORAL_TABLET | Freq: Two times a day (BID) | ORAL | 0 refills | Status: AC

## 2020-09-11 NOTE — Progress Notes (Signed)
Marc Darby, MD [address]  [place]  [place], [place] [address]  Main: [phone]  Fax: [phone]    Gastroenterology Consultation  Referring Provider:     Kerri Perches, PA-C Primary Care Physician:  Patient, No Pcp Per Primary Gastroenterologist:  Dr. Cephas [name] Reason for Consultation:     Rectal bleeding, discuss about colonoscopy        HPI:   Marc [name] is a 45 y.o. male referred by Dr. Patient, No Pcp Per  for consultation & management of rectal bleeding, discuss about colonoscopy.  Patient is morbidly obese, history of congestive heart failure, EF of 20 to 25%, hypertension.  Patient reports that about 2 years ago, he had a cyst in the perianal area that was incised and since then it resulted in an opening which causes pain, bleeding as well as drainage.  He finds it very inconvenient during bowel movement as well.  He does report altered bowel habits, stools are variable consistency on Bristol stool scale anywhere from 4-7, sometimes notices blood on wiping.  He is closely followed by cardiology, scheduled to undergo cardiac catheterization in early February  He does smoke.  Denies alcohol use  NSAIDs: None  Antiplts/Anticoagulants/Anti thrombotics: None  GI Procedures: He denies family history of GI malignancy  Past Medical History:  Diagnosis Date  . Bipolar affective ([place])   . CHF (congestive heart failure) ([place])   . Hypertension     Past Surgical History:  Procedure Laterality Date  . BICEPS TENDON REPAIR    . FOOT SURGERY     metataral and fasciotomy  . ROTATOR CUFF REPAIR      Current Outpatient Medications:  .  albuterol (VENTOLIN HFA) 108 (90 Base) MCG/ACT inhaler, Inhale 2 puffs into the lungs every 6 (six) hours as needed for wheezing or shortness of breath., Disp: , Rfl:  .  aspirin EC 81 MG tablet, Take 81 mg by mouth daily., Disp: , Rfl:  .  carvedilol (COREG) 6.25 MG tablet, Take 1 tablet (6.25 mg total) by mouth 2  (two) times daily., Disp: 180 tablet, Rfl: 3 .  ciprofloxacin (CIPRO) 500 MG tablet, Take 1 tablet (500 mg total) by mouth 2 (two) times daily for 14 days., Disp: 28 tablet, Rfl: 0 .  cyclobenzaprine (FLEXERIL) 5 MG tablet, Take 10 mg by mouth 3 (three) times daily as needed for muscle spasms., Disp: , Rfl:  .  lidocaine (LIDODERM) 5 %, Place 1 patch onto the skin daily as needed (pain). Remove & Discard patch within 12 hours or as directed by MD, Disp: , Rfl:  .  metroNIDAZOLE (FLAGYL) 500 MG tablet, Take 1 tablet (500 mg total) by mouth 2 (two) times daily for 14 days., Disp: 28 tablet, Rfl: 0 .  Na Sulfate-K Sulfate-Mg Sulf 17.5-3.13-1.6 GM/177ML SOLN, Take 354 mLs by mouth once for 1 dose., Disp: 354 mL, Rfl: 0 .  sacubitril-valsartan (ENTRESTO) 97-103 MG, Take 1 tablet by mouth 2 (two) times daily., Disp: 180 tablet, Rfl: 3 .  spironolactone (ALDACTONE) 25 MG tablet, Take 1 tablet by mouth once daily (Patient taking differently: Take 25 mg by mouth daily.), Disp: 90 tablet, Rfl: 3 .  torsemide (DEMADEX) 20 MG tablet, Take 40 mg by mouth 2 (two) times daily., Disp: , Rfl:  .  vitamin C (ASCORBIC ACID) 500 MG tablet, Take 500 mg by mouth daily., Disp: , Rfl:  .  allopurinol (ZYLOPRIM) 100 MG tablet, Take 100 mg by mouth daily. (  Patient not taking: Reported on 09/11/2020), Disp: , Rfl:    Family History  Problem Relation Age of Onset  . Stroke Mother   . Dementia Mother   . Hypertension Father   . Diabetes Father      Social History   Tobacco Use  . Smoking status: Current Every Day Smoker    Packs/day: 0.50    Years: 25.00    Pack years: 12.50    Types: Cigarettes  . Smokeless tobacco: Never Used  Substance Use Topics  . Alcohol use: Not Currently  . Drug use: Not Currently    Allergies as of 09/11/2020 - Review Complete 09/11/2020  Allergen Reaction Noted  . Geodon [ziprasidone] Other (See Comments) 12/11/2019  . Tramadol Other (See Comments) 10/09/2018  . Amoxicillin Rash  10/09/2018  . Zithromax [azithromycin] Rash 10/09/2018    Review of Systems:    All systems reviewed and negative except where noted in HPI.   Physical Exam:  BP (!) 142/83 (BP Location: Left Arm, Patient Position: Sitting, Cuff Size: Large)   Pulse 88   Temp 98.7 F (37.1 C) (Oral)   Ht 5\' 9"  (1.753 m)   Wt (!) 349 lb (158.3 kg)   BMI 51.54 kg/m  No LMP for male patient.  General:   Alert,  Well-developed, well-nourished, pleasant and cooperative in NAD Head:  Normocephalic and atraumatic. Eyes:  Sclera clear, no icterus.   Conjunctiva pink. Ears:  Normal auditory acuity. Nose:  No deformity, discharge, or lesions. Mouth:  No deformity or lesions,oropharynx pink & moist. Neck:  Supple; no masses or thyromegaly. Lungs:  Respirations even and unlabored.  Clear throughout to auscultation.   No wheezes, crackles, or rhonchi. No acute distress. Heart:  Regular rate and rhythm; no murmurs, clicks, rubs, or gallops. Abdomen:  Normal bowel sounds. Soft, non-tender and non-distended without masses, hepatosplenomegaly or hernias noted.  No guarding or rebound tenderness.   Rectal: Perianal fistula with purulent discharge located to left lateral anal orifice.  Nontender digital rectal exam, palpable hemorrhoids.  No gross blood appreciated Msk:  Symmetrical without gross deformities. Good, equal movement & strength bilaterally. Pulses:  Normal pulses noted. Extremities:  No clubbing or edema.  No cyanosis. Neurologic:  Alert and oriented x3;  grossly normal neurologically. Skin:  Intact without significant lesions or rashes. No jaundice. Psych:  Alert and cooperative. Normal mood and affect.  Imaging Studies: Reviewed  Assessment and Plan:   Marc [name] is a 44 y.o. 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

## 2020-09-11 NOTE — Telephone Encounter (Signed)
Called the office of [facility] and left a detailed message stating that patient is not seen in our practice. To send the cardiac clearance to Dr. Clayborn Bigness at [facility] or to the [facility] at [facility].

## 2020-09-11 NOTE — Telephone Encounter (Signed)
I called the patient and explained the reason for my call. Marc [name] stated that he see Dr. Clayborn Bigness at [facility] and [facility] at [facility]. I will call the requesting office and give them the needed information to send the cardiac clearance.

## 2020-09-11 NOTE — Telephone Encounter (Signed)
Primary Cardiologist:No primary care provider on file.  Chart reviewed as part of pre-operative protocol coverage. Because of Marc [name]'s past medical history and time since last visit, he/she will require a follow-up visit in order to better assess preoperative cardiovascular risk.  Pre-op covering staff: - Please schedule appointment and call patient to inform them. - Please contact requesting surgeon's office via preferred method (i.e, phone, fax) to inform them of need for appointment prior to surgery.  If applicable, this message will also be routed to pharmacy pool and/or primary cardiologist for input on holding anticoagulant/antiplatelet agent as requested below so that this information is available at time of patient's appointment.   Deberah Pelton, NP  09/11/2020, 3:10 PM

## 2020-09-11 NOTE — Telephone Encounter (Signed)
Faxed cardiac clearance to [facility] clinic cardiology

## 2020-09-11 NOTE — Telephone Encounter (Signed)
   [facility] Pre-operative Risk Assessment    Request for surgical clearance:  1. What type of surgery is being performed? Colonoscopy    2. When is this surgery scheduled? 10/09/2020   3. Are there any medications that need to be held prior to surgery and how long? None   4. Practice name and name of physician performing surgery?  [facility] Dr. Marius Ditch  5. What is your office phone and fax number? [phone]   6. Anesthesia type (None, local, MAC, general) ? General    Shelby Mattocks 09/11/2020, 2:57 PM  _________________________________________________________________   (provider comments below)

## 2020-09-13 ENCOUNTER — Other Ambulatory Visit: Payer: Self-pay | Admitting: Gastroenterology

## 2020-09-17 ENCOUNTER — Other Ambulatory Visit
Admission: RE | Admit: 2020-09-17 | Discharge: 2020-09-17 | Disposition: A | Discharge status: Home / Self Care | Payer: Medicare Other | Source: Ambulatory Visit | Attending: Gastroenterology | Admitting: Gastroenterology

## 2020-09-17 ENCOUNTER — Other Ambulatory Visit: Payer: Self-pay

## 2020-09-17 DIAGNOSIS — Z20822 Contact with and (suspected) exposure to covid-19: Secondary | ICD-10-CM | POA: Insufficient documentation

## 2020-09-17 DIAGNOSIS — Z01812 Encounter for preprocedural laboratory examination: Secondary | ICD-10-CM | POA: Insufficient documentation

## 2020-09-17 LAB — SARS CORONAVIRUS 2 (TAT 6-24 HRS): SARS Coronavirus 2: NEGATIVE

## 2020-09-17 LAB — CALPROTECTIN, FECAL: Calprotectin, Fecal: <16 ug/g (ref 0–120)

## 2020-09-19 ENCOUNTER — Other Ambulatory Visit: Payer: Self-pay

## 2020-09-19 ENCOUNTER — Ambulatory Visit
Admission: RE | Admit: 2020-09-19 | Discharge: 2020-09-19 | Disposition: A | Discharge status: Home / Self Care | Payer: Medicare Other | Attending: Internal Medicine | Admitting: Internal Medicine

## 2020-09-19 ENCOUNTER — Encounter
Admission: RE | Disposition: A | Discharge status: Home / Self Care | Payer: Self-pay | Source: Home / Self Care | Attending: Internal Medicine

## 2020-09-19 ENCOUNTER — Encounter: Payer: Self-pay | Admitting: Internal Medicine

## 2020-09-19 DIAGNOSIS — R079 Chest pain, unspecified: Secondary | ICD-10-CM | POA: Diagnosis present

## 2020-09-19 DIAGNOSIS — Z7982 Long term (current) use of aspirin: Secondary | ICD-10-CM | POA: Insufficient documentation

## 2020-09-19 DIAGNOSIS — F17210 Nicotine dependence, cigarettes, uncomplicated: Secondary | ICD-10-CM | POA: Diagnosis not present

## 2020-09-19 DIAGNOSIS — R0600 Dyspnea, unspecified: Secondary | ICD-10-CM | POA: Insufficient documentation

## 2020-09-19 DIAGNOSIS — Z6841 Body Mass Index (BMI) 40.0 and over, adult: Secondary | ICD-10-CM | POA: Insufficient documentation

## 2020-09-19 DIAGNOSIS — Z79899 Other long term (current) drug therapy: Secondary | ICD-10-CM | POA: Insufficient documentation

## 2020-09-19 HISTORY — PX: RIGHT/LEFT HEART CATH AND CORONARY ANGIOGRAPHY: CATH118266

## 2020-09-19 SURGERY — RIGHT/LEFT HEART CATH AND CORONARY ANGIOGRAPHY
Anesthesia: Moderate Sedation | Laterality: Bilateral

## 2020-09-19 MED ORDER — MIDAZOLAM HCL 2 MG/2ML IJ SOLN
INTRAMUSCULAR | Status: DC | PRN
  Administered 2020-09-19 (×2): 1 mg via INTRAVENOUS

## 2020-09-19 MED ORDER — FENTANYL CITRATE (PF) 100 MCG/2ML IJ SOLN
INTRAMUSCULAR | Status: AC
  Filled 2020-09-19: qty 2

## 2020-09-19 MED ORDER — SODIUM CHLORIDE 0.9 % WEIGHT BASED INFUSION
3.0000 mL/kg/h | INTRAVENOUS | Status: AC
  Administered 2020-09-19: 3 mL/kg/h via INTRAVENOUS

## 2020-09-19 MED ORDER — ASPIRIN 81 MG PO CHEW: 81.0000 mg | CHEWABLE_TABLET | ORAL | Status: AC

## 2020-09-19 MED ORDER — IOHEXOL 300 MG/ML  SOLN
INTRAMUSCULAR | Status: DC | PRN
  Administered 2020-09-19: 60 mL

## 2020-09-19 MED ORDER — MIDAZOLAM HCL 2 MG/2ML IJ SOLN
INTRAMUSCULAR | Status: AC
  Filled 2020-09-19: qty 2

## 2020-09-19 MED ORDER — LIDOCAINE HCL (PF) 1 % IJ SOLN
INTRAMUSCULAR | Status: AC
  Filled 2020-09-19: qty 30

## 2020-09-19 MED ORDER — FENTANYL CITRATE (PF) 100 MCG/2ML IJ SOLN
INTRAMUSCULAR | Status: DC | PRN
  Administered 2020-09-19 (×2): 25 ug via INTRAVENOUS

## 2020-09-19 MED ORDER — HEPARIN (PORCINE) IN NACL 1000-0.9 UT/500ML-% IV SOLN
INTRAVENOUS | Status: DC | PRN
  Administered 2020-09-19: 500 mL

## 2020-09-19 MED ORDER — ASPIRIN 81 MG PO CHEW
CHEWABLE_TABLET | ORAL | Status: AC
  Administered 2020-09-19: 81 mg via ORAL
  Filled 2020-09-19: qty 1

## 2020-09-19 MED ORDER — HEPARIN (PORCINE) IN NACL 1000-0.9 UT/500ML-% IV SOLN
INTRAVENOUS | Status: AC
  Filled 2020-09-19: qty 1000

## 2020-09-19 MED ORDER — SODIUM CHLORIDE 0.9 % IV SOLN: 250.0000 mL | INTRAVENOUS | Status: DC | PRN

## 2020-09-19 MED ORDER — SODIUM CHLORIDE 0.9 % WEIGHT BASED INFUSION: 1.0000 mL/kg/h | INTRAVENOUS | Status: DC

## 2020-09-19 MED ORDER — SODIUM CHLORIDE 0.9% FLUSH: 3.0000 mL | INTRAVENOUS | Status: DC | PRN

## 2020-09-19 SURGICAL SUPPLY — 10 items
CATH INFINITI 5FR JL4 (CATHETERS) ×2 IMPLANT
CATH INFINITI JR4 5F (CATHETERS) ×2 IMPLANT
CATH SWAN GANZ 7F STRAIGHT (CATHETERS) ×2 IMPLANT
DEVICE CLOSURE MYNXGRIP 5F (Vascular Products) ×2 IMPLANT
KIT MANI 3VAL PERCEP (MISCELLANEOUS) ×2 IMPLANT
NEEDLE PERC 18GX7CM (NEEDLE) ×2 IMPLANT
PACK CARDIAC CATH (CUSTOM PROCEDURE TRAY) ×2 IMPLANT
SHEATH AVANTI 5FR X 11CM (SHEATH) ×2 IMPLANT
SHEATH AVANTI 7FRX11 (SHEATH) ×2 IMPLANT
WIRE GUIDERIGHT .035X150 (WIRE) ×2 IMPLANT

## 2020-09-20 ENCOUNTER — Encounter: Payer: Self-pay | Admitting: Internal Medicine

## 2020-09-20 ENCOUNTER — Other Ambulatory Visit: Payer: Medicare Other

## 2020-10-07 ENCOUNTER — Ambulatory Visit
Admission: RE | Admit: 2020-10-07 | Discharge: 2020-10-07 | Disposition: A | Discharge status: Home / Self Care | Payer: Medicare Other | Source: Ambulatory Visit | Attending: Gastroenterology | Admitting: Gastroenterology

## 2020-10-07 ENCOUNTER — Other Ambulatory Visit: Payer: Self-pay

## 2020-10-07 DIAGNOSIS — Z20822 Contact with and (suspected) exposure to covid-19: Secondary | ICD-10-CM | POA: Diagnosis not present

## 2020-10-07 DIAGNOSIS — Z01812 Encounter for preprocedural laboratory examination: Secondary | ICD-10-CM | POA: Diagnosis present

## 2020-10-07 LAB — SARS CORONAVIRUS 2 (TAT 6-24 HRS): SARS Coronavirus 2: NEGATIVE

## 2020-10-08 ENCOUNTER — Telehealth: Payer: Self-pay

## 2020-10-08 NOTE — Telephone Encounter (Signed)
Returned patients call. Patient asked for his procedure time. Explained to pt he would contact the Endo unit to receive his time. Pt verbalized the Endo nurse just reached out to him.

## 2020-10-09 ENCOUNTER — Encounter
Admission: RE | Disposition: A | Discharge status: Home / Self Care | Payer: Self-pay | Source: Home / Self Care | Attending: Gastroenterology

## 2020-10-09 ENCOUNTER — Ambulatory Visit
Admission: RE | Admit: 2020-10-09 | Discharge: 2020-10-09 | Disposition: A | Discharge status: Home / Self Care | Payer: Medicare Other | Attending: Gastroenterology | Admitting: Gastroenterology

## 2020-10-09 ENCOUNTER — Telehealth: Payer: Self-pay

## 2020-10-09 ENCOUNTER — Other Ambulatory Visit: Payer: Self-pay

## 2020-10-09 ENCOUNTER — Ambulatory Visit: Payer: Medicare Other | Admitting: Registered Nurse

## 2020-10-09 ENCOUNTER — Encounter: Payer: Self-pay | Admitting: Gastroenterology

## 2020-10-09 DIAGNOSIS — Z880 Allergy status to penicillin: Secondary | ICD-10-CM | POA: Diagnosis not present

## 2020-10-09 DIAGNOSIS — D124 Benign neoplasm of descending colon: Secondary | ICD-10-CM | POA: Diagnosis not present

## 2020-10-09 DIAGNOSIS — K5730 Diverticulosis of large intestine without perforation or abscess without bleeding: Secondary | ICD-10-CM | POA: Diagnosis not present

## 2020-10-09 DIAGNOSIS — K625 Hemorrhage of anus and rectum: Secondary | ICD-10-CM | POA: Diagnosis present

## 2020-10-09 DIAGNOSIS — Z888 Allergy status to other drugs, medicaments and biological substances status: Secondary | ICD-10-CM | POA: Insufficient documentation

## 2020-10-09 DIAGNOSIS — Z881 Allergy status to other antibiotic agents status: Secondary | ICD-10-CM | POA: Insufficient documentation

## 2020-10-09 DIAGNOSIS — F17210 Nicotine dependence, cigarettes, uncomplicated: Secondary | ICD-10-CM | POA: Insufficient documentation

## 2020-10-09 DIAGNOSIS — K635 Polyp of colon: Secondary | ICD-10-CM

## 2020-10-09 HISTORY — PX: COLONOSCOPY WITH PROPOFOL: SHX5780

## 2020-10-09 SURGERY — COLONOSCOPY WITH PROPOFOL
Anesthesia: General

## 2020-10-09 MED ORDER — MIDAZOLAM HCL 2 MG/2ML IJ SOLN
INTRAMUSCULAR | Status: AC
  Filled 2020-10-09: qty 2

## 2020-10-09 MED ORDER — SODIUM CHLORIDE 0.9 % IV SOLN: INTRAVENOUS | Status: DC

## 2020-10-09 MED ORDER — DEXMEDETOMIDINE (PRECEDEX) IN NS 20 MCG/5ML (4 MCG/ML) IV SYRINGE
PREFILLED_SYRINGE | INTRAVENOUS | Status: DC | PRN
  Administered 2020-10-09 (×2): 20 ug via INTRAVENOUS

## 2020-10-09 MED ORDER — PROPOFOL 10 MG/ML IV BOLUS
INTRAVENOUS | Status: DC | PRN
  Administered 2020-10-09 (×2): 20 mg via INTRAVENOUS
  Administered 2020-10-09: 60 mg via INTRAVENOUS

## 2020-10-09 MED ORDER — MIDAZOLAM HCL 2 MG/2ML IJ SOLN
INTRAMUSCULAR | Status: DC | PRN
  Administered 2020-10-09 (×2): 1 mg via INTRAVENOUS

## 2020-10-09 MED ORDER — PROPOFOL 500 MG/50ML IV EMUL
INTRAVENOUS | Status: AC
  Filled 2020-10-09: qty 50

## 2020-10-09 MED ORDER — PROPOFOL 500 MG/50ML IV EMUL
INTRAVENOUS | Status: DC | PRN
  Administered 2020-10-09: 70 ug/kg/min via INTRAVENOUS

## 2020-10-09 MED ORDER — LIDOCAINE HCL (CARDIAC) PF 100 MG/5ML IV SOSY
PREFILLED_SYRINGE | INTRAVENOUS | Status: DC | PRN
  Administered 2020-10-09: 80 mg via INTRAVENOUS

## 2020-10-09 NOTE — H&P (Signed)
Cephas Darby, MD [address]  [place]  [place], [place] [address]  Main: [phone]  Fax: [phone] Pager: [phone]  Primary Care Physician:  [facility] Primary Gastroenterologist:  Dr. Cephas Darby  Pre-Procedure History & Physical: HPI:  Marc [name] is a 45 y.o. male is here for an colonoscopy.   Past Medical History:  Diagnosis Date  . Bipolar affective ([place])   . CHF (congestive heart failure) ([place])   . Hypertension     Past Surgical History:  Procedure Laterality Date  . BICEPS TENDON REPAIR    . FOOT SURGERY     metataral and fasciotomy  . RIGHT/LEFT HEART CATH AND CORONARY ANGIOGRAPHY Bilateral 09/19/2020   Procedure: RIGHT/LEFT HEART CATH AND CORONARY ANGIOGRAPHY;  Surgeon: Yolonda Kida, MD;  Location: [facility];  Service: Cardiovascular;  Laterality: Bilateral;  . ROTATOR CUFF REPAIR      Prior to Admission medications   Medication Sig Start Date End Date Taking? Authorizing Provider  carvedilol (COREG) 6.25 MG tablet Take 1 tablet (6.25 mg total) by mouth 2 (two) times daily. 08/12/20  Yes Hackney, Tina A, FNP  lidocaine (LIDODERM) 5 % Place 1 patch onto the skin daily as needed (pain). Remove & Discard patch within 12 hours or as directed by MD   Yes [provider]  sacubitril-valsartan (ENTRESTO) 97-103 MG Take 1 tablet by mouth 2 (two) times daily. 11/10/19  Yes Darylene Price A, FNP  spironolactone (ALDACTONE) 25 MG tablet Take 1 tablet by mouth once daily Patient taking differently: Take 25 mg by mouth daily. 07/24/20  Yes Hackney, Otila Kluver A, FNP  torsemide (DEMADEX) 20 MG tablet Take 40 mg by mouth 2 (two) times daily.   Yes [provider]  vitamin C (ASCORBIC ACID) 500 MG tablet Take 500 mg by mouth daily.   Yes [provider]  albuterol (VENTOLIN HFA) 108 (90 Base) MCG/ACT inhaler Inhale 2 puffs into the lungs every 6 (six) hours as needed for wheezing or shortness of  breath.    [provider]  allopurinol (ZYLOPRIM) 100 MG tablet Take 100 mg by mouth daily. Patient not taking: No sig reported 08/29/20   [provider]  aspirin EC 81 MG tablet Take 81 mg by mouth daily.    [provider]  cyclobenzaprine (FLEXERIL) 5 MG tablet Take 10 mg by mouth 3 (three) times daily as needed for muscle spasms.    [provider]    Allergies as of 09/11/2020 - Review Complete 09/11/2020  Allergen Reaction Noted  . Geodon [ziprasidone] Other (See Comments) 12/11/2019  . Tramadol Other (See Comments) 10/09/2018  . Amoxicillin Rash 10/09/2018  . Zithromax [azithromycin] Rash 10/09/2018    Family History  Problem Relation Age of Onset  . Stroke Mother   . Dementia Mother   . Hypertension Father   . Diabetes Father     Social History   Socioeconomic History  . Marital status: Married    Spouse name: Not on file  . Number of children: Not on file  . Years of education: Not on file  . Highest education level: Not on file  Occupational History  . Not on file  Tobacco Use  . Smoking status: Current Every Day Smoker    Packs/day: 0.50    Years: 25.00    Pack years: 12.50    Types: Cigarettes  . Smokeless tobacco: Never Used  Vaping Use  . Vaping Use: Never used  Substance and Sexual Activity  . Alcohol use: Not Currently  . Drug use: Not Currently  . Sexual activity: Not Currently  Other Topics Concern  . Not on file  Social History Narrative  . Not on file   Social Determinants of Health   Financial Resource Strain: Not on file  Food Insecurity: Not on file  Transportation Needs: Not on file  Physical Activity: Not on file  Stress: Not on file  Social Connections: Not on file  Intimate Partner Violence: Not on file    Review of Systems: See HPI, otherwise negative ROS  Physical Exam: BP (!) 165/106   Pulse 89   Temp (!) 97.1 F (36.2 C) (Temporal)   Resp 20   Ht 5\' 9"  (1.753 m)   Wt (!) 162.4  kg   SpO2 100%   BMI 52.87 kg/m  General:   Alert,  pleasant and cooperative in NAD Head:  Normocephalic and atraumatic. Neck:  Supple; no masses or thyromegaly. Lungs:  Clear throughout to auscultation.    Heart:  Regular rate and rhythm. Abdomen:  Soft, nontender and nondistended. Normal bowel sounds, without guarding, and without rebound.   Neurologic:  Alert and  oriented x4;  grossly normal neurologically.  Impression/Plan: Marc [name] is here for an colonoscopy to be performed for rectal bleeding  Risks, benefits, limitations, and alternatives regarding  colonoscopy have been reviewed with the patient.  Questions have been answered.  All parties agreeable.   Sherri Sear, MD  10/09/2020, 8:41 AM

## 2020-10-09 NOTE — Anesthesia Preprocedure Evaluation (Addendum)
Anesthesia Evaluation  Patient identified by MRN, date of birth, ID band Patient awake    Reviewed: Allergy & Precautions, H&P , NPO status , Patient's Chart, lab work & pertinent test results  Airway Mallampati: III  TM Distance: >3 FB Neck ROM: Full    Dental no notable dental hx.    Pulmonary neg pulmonary ROS, Current Smoker and Patient abstained from smoking.,    Pulmonary exam normal        Cardiovascular Exercise Tolerance: Poor METS: < 3 Mets hypertension, Pt. on medications + angina with exertion + Past MI, +CHF and + DOE  Normal cardiovascular examIV     Neuro/Psych PSYCHIATRIC DISORDERS Bipolar Disorder negative neurological ROS     GI/Hepatic negative GI ROS, Neg liver ROS, Bowel prep,  Endo/Other  Morbid obesity  Renal/GU negative Renal ROS  negative genitourinary   Musculoskeletal negative musculoskeletal ROS (+)   Abdominal   Peds negative pediatric ROS (+)  Hematology negative hematology ROS (+)   Anesthesia Other Findings  Severely depressed overall left ventricular function EF less than 25%  Severely enlarged left ventricular chamber  Left main large free of disease  LAD was large and free of disease  Circumflex was large and free of disease left dominant  Right heart cath showed no evidence of pulmonary hypertension  Mean PA was 23 mean wedge of 10  Cardiac output of 7.6 Fick   Angina at rest (CMS-HCC) - Primary  HFrEF (heart failure with reduced ejection fraction) (CMS-HCC)  Nonischemic cardiomyopathy (CMS-HCC)  Chronic dyspnea  Essential hypertension  Dyslipidemia  Morbid obesity with body mass index of 50.0-59.9 in adult (CMS-HCC)  Tobacco use    Left Ventricle: The left ventricle has severely reduced systolic  function, with an ejection fraction of 20-25%. The cavity size was  severely dilated. There is moderately increased left ventricular wall  thickness. Left  ventricular diastolic Doppler  parameters are consistent with impaired relaxation Left ventricular  diffuse hypokinesis. Definity contrast agent was given IV to delineate the  left ventricular endocardial borders.  Right Ventricle: The right ventricle has mildly reduced systolic function.  The cavity was moderately enlarged. There is no increase in right  ventricular wall thickness.  Left Atrium: left atrial size was moderately dilated  Right Atrium: right atrial size was moderately dilated. Right atrial  pressure is estimated at 10 mmHg.  Interatrial Septum: The interatrial septum was not well visualized.  Pericardium: There is no evidence of pericardial effusion.  Mitral Valve: The mitral valve is normal in structure. Mild thickening of  the mitral valve leaflet. Mitral valve regurgitation is moderate by color  flow Doppler.  Tricuspid Valve: The tricuspid valve is normal in structure. Tricuspid  valve regurgitation is moderate by color flow Doppler.  Aortic Valve: The aortic valve is normal in structure. Aortic valve  regurgitation was not visualized by color flow Doppler.  Pulmonic Valve: The pulmonic valve was not well visualized. Pulmonic valve  regurgitation was not assessed by color flow Doppler.    Reproductive/Obstetrics negative OB ROS                             Anesthesia Physical Anesthesia Plan  ASA: IV  Anesthesia Plan: General   Post-op Pain Management:    Induction: Intravenous  PONV Risk Score and Plan: 2 and Propofol infusion and TIVA  Airway Management Planned: Natural Airway and Nasal CPAP  Additional Equipment:   Intra-op Plan:  Post-operative Plan:   Informed Consent: I have reviewed the patients History and Physical, chart, labs and discussed the procedure including the risks, benefits and alternatives for the proposed anesthesia with the patient or authorized representative who has indicated his/her understanding and  acceptance.       Plan Discussed with: CRNA, Anesthesiologist and Surgeon  Anesthesia Plan Comments:        Anesthesia Quick Evaluation

## 2020-10-09 NOTE — Op Note (Signed)
[facility] Patient Name: Marc [name] Procedure Date: 10/09/2020 10:06 AM MRN: [id number] Account #: [id number] Date of Birth: 13-Nov-1975 Admit Type: Outpatient Age: 45 Room: [facility] ENDO ROOM 3 Gender: Male Note Status: Finalized Procedure:             Colonoscopy Indications:           This is the patient's first colonoscopy, Rectal                         bleeding Providers:             Lin Landsman MD, MD Medicines:             General Anesthesia Complications:         No immediate complications. Estimated blood loss: None. Procedure:             Pre-Anesthesia Assessment:                        - Prior to the procedure, a History and Physical was                         performed, and patient medications and allergies were                         reviewed. The patient is competent. The risks and                         benefits of the procedure and the sedation options and                         risks were discussed with the patient. All questions                         were answered and informed consent was obtained.                         Patient identification and proposed procedure were                         verified by the physician, the nurse, the                         anesthesiologist, the anesthetist and the technician                         in the pre-procedure area in the procedure room in the                         endoscopy suite. Mental Status Examination: alert and                         oriented. Airway Examination: normal oropharyngeal                         airway and neck mobility. Respiratory Examination:                         clear to auscultation. CV Examination: normal.  Prophylactic Antibiotics: The patient does not require                         prophylactic antibiotics. Prior Anticoagulants: The                         patient has taken no previous anticoagulant or                          antiplatelet agents. ASA Grade Assessment: IV - A                         patient with severe systemic disease that is a                         constant threat to life. After reviewing the risks and                         benefits, the patient was deemed in satisfactory                         condition to undergo the procedure. The anesthesia                         plan was to use general anesthesia. Immediately prior                         to administration of medications, the patient was                         re-assessed for adequacy to receive sedatives. The                         heart rate, respiratory rate, oxygen saturations,                         blood pressure, adequacy of pulmonary ventilation, and                         response to care were monitored throughout the                         procedure. The physical status of the patient was                         re-assessed after the procedure.                        After obtaining informed consent, the colonoscope was                         passed under direct vision. Throughout the procedure,                         the patient's blood pressure, pulse, and oxygen                         saturations were monitored continuously. The  Colonoscope was introduced through the anus and                         advanced to the the terminal ileum, with                         identification of the appendiceal orifice and IC                         valve. The colonoscopy was performed without                         difficulty. The patient tolerated the procedure well.                         The quality of the bowel preparation was evaluated                         using the BBPS [facility] Bowel Preparation Scale) with                         scores of: Right Colon = 2 (minor amount of residual                         staining, small fragments of stool and/or opaque                          liquid, but mucosa seen well), Transverse Colon = 2                         (minor amount of residual staining, small fragments of                         stool and/or opaque liquid, but mucosa seen well) and                         Left Colon = 2 (minor amount of residual staining,                         small fragments of stool and/or opaque liquid, but                         mucosa seen well). The total BBPS score equals 6. Findings:      The perianal and digital rectal examinations were normal. Pertinent       negatives include normal sphincter tone and no palpable rectal lesions.      The terminal ileum appeared normal.      A 8 mm polyp was found in the descending colon. The polyp was       pedunculated. The polyp was removed with a cold snare. Resection and       retrieval were complete.      Two sessile polyps were found in the recto-sigmoid colon and sigmoid       colon. The polyps were 4 to 6 mm in size. These polyps were removed with       a cold snare. Resection and retrieval were complete.      Many diverticula were  found in the left colon.      The retroflexed view of the distal rectum and anal verge was normal and       showed no anal or rectal abnormalities. Impression:            - The examined portion of the ileum was normal.                        - One 8 mm polyp in the descending colon, removed with                         a cold snare. Resected and retrieved, source of rectal                         bleeding.                        - Two 4 to 6 mm polyps at the recto-sigmoid colon and                         in the sigmoid colon, removed with a cold snare.                         Resected and retrieved.                        - Diverticulosis in the left colon.                        - The distal rectum and anal verge are normal on                         retroflexion view. Recommendation:        - Discharge patient to home (with escort).                        -  Resume previous diet today.                        - Continue present medications.                        - Await pathology results.                        - Repeat colonoscopy in 3-5 years for surveillance                         with 2 day prep. Procedure Code(s):     --- Professional ---                        [phone], Colonoscopy, flexible; with removal of                         tumor(s), polyp(s), or other lesion(s) by snare                         technique Diagnosis Code(s):     --- Professional ---  K63.5, Polyp of colon                        K62.5, Hemorrhage of anus and rectum                        K57.30, Diverticulosis of large intestine without                         perforation or abscess without bleeding CPT copyright 2019 American Medical Association. All rights reserved. The codes documented in this report are preliminary and upon coder review may  be revised to meet current compliance requirements. Dr. Ulyess Mort Lin Landsman MD, MD 10/09/2020 10:44:55 AM This report has been signed electronically. Number of Addenda: 0 Note Initiated On: 10/09/2020 10:06 AM Scope Withdrawal Time: 0 hours 14 minutes 10 seconds  Total Procedure Duration: 0 hours 16 minutes 10 seconds  Estimated Blood Loss:  Estimated blood loss: none.      [facility]

## 2020-10-09 NOTE — Anesthesia Procedure Notes (Signed)
Date/Time: 10/09/2020 10:13 AM Performed by: Doreen Salvage, CRNA Pre-anesthesia Checklist: Patient identified, Emergency Drugs available, Suction available and Patient being monitored Patient Re-evaluated:Patient Re-evaluated prior to induction Oxygen Delivery Method: Supernova nasal CPAP Induction Type: IV induction Dental Injury: Teeth and Oropharynx as per pre-operative assessment  Comments: Nasal cannula with etCO2 monitoring

## 2020-10-09 NOTE — Telephone Encounter (Signed)
-----   Message from Lin Landsman, MD sent at 10/09/2020  2:56 PM EST ----- Regarding: Please cancel his next follow-up appointment

## 2020-10-09 NOTE — Telephone Encounter (Signed)
Canceled appointment per Dr. Marius Ditch orders

## 2020-10-09 NOTE — Transfer of Care (Signed)
Immediate Anesthesia Transfer of Care Note  Patient: MCKYLE SOLANKI  Procedure(s) Performed: Procedure(s): COLONOSCOPY WITH PROPOFOL (N/A)  Patient Location: PACU and Endoscopy Unit  Anesthesia Type:General  Level of Consciousness: sedated  Airway & Oxygen Therapy: Patient Spontanous Breathing and Patient connected to nasal cannula oxygen  Post-op Assessment: Report given to RN and Post -op Vital signs reviewed and stable  Post vital signs: Reviewed and stable  Last Vitals:  Vitals:   10/09/20 0829 10/09/20 1050  BP: (!) 165/106 133/89  Pulse: 89 (!) 108  Resp: 20 (!) 24  Temp: (!) 36.2 C (!) 35.9 C  SpO2: 833% 82%    Complications: No apparent anesthesia complications

## 2020-10-09 NOTE — Anesthesia Postprocedure Evaluation (Signed)
Anesthesia Post Note  Patient: Marc [name]  Procedure(s) Performed: COLONOSCOPY WITH PROPOFOL (N/A )  Anesthesia Type: General Anesthetic complications: no   No complications documented.   Last Vitals:  Vitals:   10/09/20 1100 10/09/20 1110  BP: (!) 141/107 (!) 149/107  Pulse:    Resp:    Temp:    SpO2:      Last Pain:  Vitals:   10/09/20 1110  TempSrc:   PainSc: 0-No pain                 Phill Mutter

## 2020-10-10 ENCOUNTER — Encounter: Payer: Self-pay | Admitting: Gastroenterology

## 2020-10-11 LAB — SURGICAL PATHOLOGY

## 2020-10-14 ENCOUNTER — Telehealth: Payer: Self-pay

## 2020-10-14 NOTE — Telephone Encounter (Signed)
-----   Message from Lin Landsman, MD sent at 10/12/2020  1:00 PM EST ----- Pathology result of the polyp that was removed came back benign, however the polyp tissue extends to the base of the polyp. Therefore we need to take a look at that site again to make sure there is no residual polyp. Recommend flexible sigmoidoscopy in next 3 to 4 days  Rohini Vanga

## 2020-10-14 NOTE — Telephone Encounter (Signed)
Called and left  A message for call back

## 2020-10-15 NOTE — Telephone Encounter (Signed)
Called and left a message for call back  

## 2020-10-16 NOTE — Telephone Encounter (Signed)
Sent letter to patient.

## 2020-10-16 NOTE — Telephone Encounter (Signed)
Marc [name] and send him a letter  RV

## 2020-10-16 NOTE — Telephone Encounter (Signed)
Called and left a message for call back. Called sister and left a message for call back. Please advised if you want me to send a letter

## 2020-10-16 NOTE — Telephone Encounter (Signed)
Called and left a message for call back. Sent a [employer]

## 2020-10-17 NOTE — H&P (Signed)
Please see care everywhere 10/01/20 [facility]  There is a clinic note in care everywhere 9/92/42 Done by Gladstone Pih NP

## 2020-10-22 ENCOUNTER — Other Ambulatory Visit: Payer: Self-pay

## 2020-10-22 ENCOUNTER — Telehealth: Payer: Self-pay

## 2020-10-22 DIAGNOSIS — K625 Hemorrhage of anus and rectum: Secondary | ICD-10-CM

## 2020-10-22 NOTE — Telephone Encounter (Signed)
Called and left a message for patient sister asking patient to call us. Can not give sister any information because there is not a sign DPR. Letter was mailed to patient on 10/16/2020

## 2020-10-22 NOTE — Telephone Encounter (Signed)
-----   Message from Lin Landsman, MD sent at 10/22/2020  2:55 PM EST ----- Called patient's sister and asked her to let him know that we are trying to reach out to him and gave your number.  Hopefully he will call you  Thanks RV

## 2020-10-28 ENCOUNTER — Other Ambulatory Visit
Admission: RE | Admit: 2020-10-28 | Discharge: 2020-10-28 | Disposition: A | Discharge status: Home / Self Care | Payer: Medicare Other | Source: Ambulatory Visit | Attending: Gastroenterology | Admitting: Gastroenterology

## 2020-10-28 ENCOUNTER — Other Ambulatory Visit: Payer: Self-pay

## 2020-10-28 DIAGNOSIS — Z20822 Contact with and (suspected) exposure to covid-19: Secondary | ICD-10-CM | POA: Diagnosis not present

## 2020-10-28 DIAGNOSIS — Z01812 Encounter for preprocedural laboratory examination: Secondary | ICD-10-CM | POA: Insufficient documentation

## 2020-10-28 LAB — SARS CORONAVIRUS 2 (TAT 6-24 HRS): SARS Coronavirus 2: NEGATIVE

## 2020-10-30 ENCOUNTER — Ambulatory Visit
Admission: RE | Admit: 2020-10-30 | Discharge: 2020-10-30 | Disposition: A | Discharge status: Home / Self Care | Payer: Medicare Other | Source: Ambulatory Visit | Attending: Gastroenterology | Admitting: Gastroenterology

## 2020-10-30 ENCOUNTER — Encounter
Admission: RE | Disposition: A | Discharge status: Home / Self Care | Payer: Self-pay | Source: Ambulatory Visit | Attending: Gastroenterology

## 2020-10-30 ENCOUNTER — Ambulatory Visit: Payer: Medicare Other | Admitting: Certified Registered Nurse Anesthetist

## 2020-10-30 ENCOUNTER — Other Ambulatory Visit: Payer: Self-pay

## 2020-10-30 ENCOUNTER — Encounter: Payer: Self-pay | Admitting: Gastroenterology

## 2020-10-30 DIAGNOSIS — I509 Heart failure, unspecified: Secondary | ICD-10-CM | POA: Insufficient documentation

## 2020-10-30 DIAGNOSIS — Z860100 Personal history of colon polyps, unspecified: Secondary | ICD-10-CM

## 2020-10-30 DIAGNOSIS — K633 Ulcer of intestine: Secondary | ICD-10-CM | POA: Diagnosis not present

## 2020-10-30 DIAGNOSIS — Z885 Allergy status to narcotic agent status: Secondary | ICD-10-CM | POA: Insufficient documentation

## 2020-10-30 DIAGNOSIS — Z8249 Family history of ischemic heart disease and other diseases of the circulatory system: Secondary | ICD-10-CM | POA: Diagnosis not present

## 2020-10-30 DIAGNOSIS — Z881 Allergy status to other antibiotic agents status: Secondary | ICD-10-CM | POA: Insufficient documentation

## 2020-10-30 DIAGNOSIS — Z823 Family history of stroke: Secondary | ICD-10-CM | POA: Insufficient documentation

## 2020-10-30 DIAGNOSIS — Z888 Allergy status to other drugs, medicaments and biological substances status: Secondary | ICD-10-CM | POA: Diagnosis not present

## 2020-10-30 DIAGNOSIS — D124 Benign neoplasm of descending colon: Secondary | ICD-10-CM | POA: Insufficient documentation

## 2020-10-30 DIAGNOSIS — I110 Hypertensive heart disease with heart failure: Secondary | ICD-10-CM | POA: Insufficient documentation

## 2020-10-30 DIAGNOSIS — Z820 Family history of epilepsy and other diseases of the nervous system: Secondary | ICD-10-CM | POA: Diagnosis not present

## 2020-10-30 DIAGNOSIS — F17210 Nicotine dependence, cigarettes, uncomplicated: Secondary | ICD-10-CM | POA: Insufficient documentation

## 2020-10-30 DIAGNOSIS — Z9889 Other specified postprocedural states: Secondary | ICD-10-CM

## 2020-10-30 DIAGNOSIS — Z833 Family history of diabetes mellitus: Secondary | ICD-10-CM | POA: Diagnosis not present

## 2020-10-30 DIAGNOSIS — Z7951 Long term (current) use of inhaled steroids: Secondary | ICD-10-CM | POA: Insufficient documentation

## 2020-10-30 DIAGNOSIS — K625 Hemorrhage of anus and rectum: Secondary | ICD-10-CM

## 2020-10-30 DIAGNOSIS — Z79899 Other long term (current) drug therapy: Secondary | ICD-10-CM | POA: Insufficient documentation

## 2020-10-30 HISTORY — PX: FLEXIBLE SIGMOIDOSCOPY: SHX5431

## 2020-10-30 SURGERY — SIGMOIDOSCOPY, FLEXIBLE
Anesthesia: General

## 2020-10-30 MED ORDER — PROPOFOL 10 MG/ML IV BOLUS
INTRAVENOUS | Status: AC
  Filled 2020-10-30: qty 20

## 2020-10-30 MED ORDER — LIDOCAINE HCL (CARDIAC) PF 100 MG/5ML IV SOSY
PREFILLED_SYRINGE | INTRAVENOUS | Status: DC | PRN
  Administered 2020-10-30: 50 mg via INTRAVENOUS

## 2020-10-30 MED ORDER — SODIUM CHLORIDE 0.9 % IV SOLN
INTRAVENOUS | Status: DC
  Administered 2020-10-30: 1000 mL via INTRAVENOUS

## 2020-10-30 MED ORDER — SPOT INK MARKER SYRINGE KIT
PACK | SUBMUCOSAL | Status: DC | PRN
  Administered 2020-10-30: 3 mL via SUBMUCOSAL

## 2020-10-30 MED ORDER — PROPOFOL 10 MG/ML IV BOLUS
INTRAVENOUS | Status: DC | PRN
  Administered 2020-10-30: 50 mg via INTRAVENOUS

## 2020-10-30 MED ORDER — LIDOCAINE HCL (PF) 2 % IJ SOLN
INTRAMUSCULAR | Status: AC
  Filled 2020-10-30: qty 5

## 2020-10-30 MED ORDER — PROPOFOL 500 MG/50ML IV EMUL
INTRAVENOUS | Status: DC | PRN
  Administered 2020-10-30: 150 ug/kg/min via INTRAVENOUS

## 2020-10-30 NOTE — Anesthesia Preprocedure Evaluation (Signed)
Anesthesia Evaluation  Patient identified by MRN, date of birth, ID band Patient awake    Reviewed: Allergy & Precautions, H&P , NPO status , Patient's Chart, lab work & pertinent test results, reviewed documented beta blocker date and time   Airway Mallampati: IV  TM Distance: >3 FB Neck ROM: full    Dental  (+) Teeth Intact   Pulmonary neg pulmonary ROS, Current Smoker,    Pulmonary exam normal        Cardiovascular Exercise Tolerance: Poor hypertension, On Medications + Past MI and +CHF  (-) Orthopnea and (-) PND Normal cardiovascular exam Rhythm:regular Rate:Normal     Neuro/Psych negative neurological ROS  negative psych ROS   GI/Hepatic negative GI ROS, Neg liver ROS,   Endo/Other  Morbid obesity  Renal/GU negative Renal ROS  negative genitourinary   Musculoskeletal   Abdominal   Peds  Hematology negative hematology ROS (+)   Anesthesia Other Findings Past Medical History: No date: Bipolar affective ([place]) No date: CHF (congestive heart failure) (HCC) No date: Hypertension Past Surgical History: No date: BICEPS TENDON REPAIR 10/09/2020: COLONOSCOPY WITH PROPOFOL; N/A     Comment:  Procedure: COLONOSCOPY WITH PROPOFOL;  Surgeon: Lin Landsman, MD;  Location: [facility] ENDOSCOPY;  Service:               Gastroenterology;  Laterality: N/A; No date: FOOT SURGERY     Comment:  metataral and fasciotomy 09/19/2020: RIGHT/LEFT HEART CATH AND CORONARY ANGIOGRAPHY; Bilateral     Comment:  Procedure: RIGHT/LEFT HEART CATH AND CORONARY               ANGIOGRAPHY;  Surgeon: Yolonda Kida, MD;  Location:              [facility];  Service: Cardiovascular;                Laterality: Bilateral; No date: ROTATOR CUFF REPAIR BMI    Body Mass Index: 51.98 kg/m     Reproductive/Obstetrics negative OB ROS                             Anesthesia Physical Anesthesia  Plan  ASA: III  Anesthesia Plan: General   Post-op Pain Management:    Induction:   PONV Risk Score and Plan:   Airway Management Planned:   Additional Equipment:   Intra-op Plan:   Post-operative Plan:   Informed Consent: I have reviewed the patients History and Physical, chart, labs and discussed the procedure including the risks, benefits and alternatives for the proposed anesthesia with the patient or authorized representative who has indicated his/her understanding and acceptance.     Dental Advisory Given  Plan Discussed with: CRNA  Anesthesia Plan Comments:         Anesthesia Quick Evaluation

## 2020-10-30 NOTE — Op Note (Signed)
[facility]astroenter[facility] Patient Name: Marc [name] Procedure Date: 10/30/2020 10:52 AM MRN: [id number] Account #: [id number] Date of Birth: 1975/10/04 Admit Type: Outpatient Age: 45 Room: [facility] ENDO ROOM 4 Gender: Male Note Status: Finalized Procedure:             Flexible Sigmoidoscopy Indications:           Personal history of colonic polyps, Review post                         polypectomy site in descending colon Providers:             Lin Landsman MD, MD Medicines:             General Anesthesia Complications:         No immediate complications. Estimated blood loss: None. Procedure:             Pre-Anesthesia Assessment:                        - Prior to the procedure, a History and Physical was                         performed, and patient medications and allergies were                         reviewed. The patient is competent. The risks and                         benefits of the procedure and the sedation options and                         risks were discussed with the patient. All questions                         were answered and informed consent was obtained.                         Patient identification and proposed procedure were                         verified by the physician, the nurse, the                         anesthesiologist, the anesthetist and the technician                         in the pre-procedure area in the procedure room in the                         endoscopy suite. Mental Status Examination: alert and                         oriented. Airway Examination: normal oropharyngeal                         airway and neck mobility. Respiratory Examination:                         clear to auscultation. CV Examination:  normal.                         Prophylactic Antibiotics: The patient does not require                         prophylactic antibiotics. Prior Anticoagulants: The                         patient has taken  no previous anticoagulant or                         antiplatelet agents. ASA Grade Assessment: III - A                         patient with severe systemic disease. After reviewing                         the risks and benefits, the patient was deemed in                         satisfactory condition to undergo the procedure. The                         anesthesia plan was to use general anesthesia.                         Immediately prior to administration of medications,                         the patient was re-assessed for adequacy to receive                         sedatives. The heart rate, respiratory rate, oxygen                         saturations, blood pressure, adequacy of pulmonary                         ventilation, and response to care were monitored                         throughout the procedure. The physical status of the                         patient was re-assessed after the procedure.                        After obtaining informed consent, the scope was passed                         under direct vision. The Endoscope was introduced                         through the anus and advanced to the the left                         transverse colon. The flexible sigmoidoscopy was  accomplished without difficulty. The patient tolerated                         the procedure well. The quality of the bowel                         preparation was adequate. Findings:      The perianal and digital rectal examinations were normal. Pertinent       negatives include normal sphincter tone and no palpable rectal lesions.      A single (solitary)and small healing ulcer from previous polypectomy was       found in the descending colon, normal appearing mucosa. No bleeding was       present. No stigmata of recent bleeding were seen. Biopsies were taken       with a cold forceps for histology. Estimated blood loss: none. Area was       tattooed with an  injection of Spot (carbon black). Impression:            - A single (solitary) ulcer in the descending colon.                         Biopsied. Tattooed. Recommendation:        - Discharge patient to home (with escort).                        - Resume previous diet today.                        - Await pathology results.                        - Perform a colonoscopy in 1 year with 2 day prep for                         polyp surveillance. Procedure Code(s):     --- Professional ---                        [phone], Sigmoidoscopy, flexible; with biopsy, single or                         multiple                        [id number], Sigmoidoscopy, flexible; with directed                         submucosal injection(s), any substance Diagnosis Code(s):     --- Professional ---                        K63.3, Ulcer of intestine                        Z86.010, Personal history of colonic polyps CPT copyright 2019 American Medical Association. All rights reserved. The codes documented in this report are preliminary and upon coder review may  be revised to meet current compliance requirements. Dr. Ulyess Mort Lin Landsman MD, MD 10/30/2020 11:33:03 AM This report has been signed electronically. Number of Addenda: 0 Note Initiated On: 10/30/2020 10:52 AM Scope Withdrawal  Time: 0 hours 11 minutes 40 seconds  Total Procedure Duration: 0 hours 14 minutes 18 seconds  Estimated Blood Loss:  Estimated blood loss: none.      [facility]

## 2020-10-30 NOTE — Anesthesia Procedure Notes (Signed)
Date/Time: 10/30/2020 11:09 AM Performed by: Johnna Acosta, CRNA Pre-anesthesia Checklist: Patient identified, Emergency Drugs available, Suction available, Patient being monitored and Timeout performed Patient Re-evaluated:Patient Re-evaluated prior to induction Oxygen Delivery Method: Supernova nasal CPAP Preoxygenation: Pre-oxygenation with 100% oxygen Induction Type: IV induction

## 2020-10-30 NOTE — Transfer of Care (Signed)
Immediate Anesthesia Transfer of Care Note  Patient: Marc [name]  Procedure(s) Performed: FLEXIBLE SIGMOIDOSCOPY (N/A )  Patient Location: PACU  Anesthesia Type:General  Level of Consciousness: awake, alert  and oriented  Airway & Oxygen Therapy: Patient Spontanous Breathing  Post-op Assessment: Report given to RN and Post -op Vital signs reviewed and stable  Post vital signs: Reviewed and stable  Last Vitals:  Vitals Value Taken Time  BP 111/85 10/30/20 1135  Temp 36.1 C 10/30/20 1135  Pulse 54 10/30/20 1135  Resp 12 10/30/20 1135  SpO2 95 % 10/30/20 1135    Last Pain:  Vitals:   10/30/20 1135  TempSrc:   PainSc: 0-No pain         Complications: No complications documented.

## 2020-10-30 NOTE — H&P (Signed)
Cephas Darby, MD [address]  [place]  [place], [place] [address]  Main: [phone]  Fax: [phone] Pager: [phone]  Primary Care Physician:  [facility] Primary Gastroenterologist:  Dr. Cephas Darby  Pre-Procedure History & Physical: HPI:  Marc [name] is a 45 y.o. male is here for an flexible sigmoidoscopy.   Past Medical History:  Diagnosis Date  . Bipolar affective ([place])   . CHF (congestive heart failure) ([place])   . Hypertension     Past Surgical History:  Procedure Laterality Date  . BICEPS TENDON REPAIR    . COLONOSCOPY WITH PROPOFOL N/A 10/09/2020   Procedure: COLONOSCOPY WITH PROPOFOL;  Surgeon: Lin Landsman, MD;  Location: [facility] ENDOSCOPY;  Service: Gastroenterology;  Laterality: N/A;  . FOOT SURGERY     metataral and fasciotomy  . RIGHT/LEFT HEART CATH AND CORONARY ANGIOGRAPHY Bilateral 09/19/2020   Procedure: RIGHT/LEFT HEART CATH AND CORONARY ANGIOGRAPHY;  Surgeon: Yolonda Kida, MD;  Location: [place] [facility] LAB;  Service: Cardiovascular;  Laterality: Bilateral;  . ROTATOR CUFF REPAIR      Prior to Admission medications   Medication Sig Start Date End Date Taking? Authorizing Provider  albuterol (VENTOLIN HFA) 108 (90 Base) MCG/ACT inhaler Inhale 2 puffs into the lungs every 6 (six) hours as needed for wheezing or shortness of breath.   Yes [provider]  aspirin EC 81 MG tablet Take 81 mg by mouth daily.   Yes [provider]  carvedilol (COREG) 6.25 MG tablet Take 1 tablet (6.25 mg total) by mouth 2 (two) times daily. 08/12/20  Yes Hackney, Otila Kluver A, FNP  cyclobenzaprine (FLEXERIL) 5 MG tablet Take 10 mg by mouth 3 (three) times daily as needed for muscle spasms.   Yes [provider]  lidocaine (LIDODERM) 5 % Place 1 patch onto the skin daily as needed (pain). Remove & Discard patch within 12 hours or as directed by MD   Yes [provider]  sacubitril-valsartan  (ENTRESTO) 97-103 MG Take 1 tablet by mouth 2 (two) times daily. 11/10/19  Yes Darylene Price A, FNP  spironolactone (ALDACTONE) 25 MG tablet Take 1 tablet by mouth once daily Patient taking differently: Take 25 mg by mouth daily. 07/24/20  Yes Hackney, Otila Kluver A, FNP  torsemide (DEMADEX) 20 MG tablet Take 40 mg by mouth 2 (two) times daily.   Yes [provider]  vitamin C (ASCORBIC ACID) 500 MG tablet Take 500 mg by mouth daily.   Yes [provider]  allopurinol (ZYLOPRIM) 100 MG tablet Take 100 mg by mouth daily. Patient not taking: No sig reported 08/29/20   [provider]    Allergies as of 10/22/2020 - Review Complete 10/09/2020  Allergen Reaction Noted  . Geodon [ziprasidone] Other (See Comments) 12/11/2019  . Tramadol Other (See Comments) 10/09/2018  . Amoxicillin Rash 10/09/2018  . Zithromax [azithromycin] Rash 10/09/2018    Family History  Problem Relation Age of Onset  . Stroke Mother   . Dementia Mother   . Hypertension Father   . Diabetes Father     Social History   Socioeconomic History  . Marital status: Married    Spouse name: Not on file  . Number of children: Not on file  . Years of education: Not on file  . Highest education level: Not on file  Occupational History  . Not on file  Tobacco Use  . Smoking status: Current Every Day Smoker    Packs/day: 0.50  Years: 25.00    Pack years: 12.50    Types: Cigarettes  . Smokeless tobacco: Never Used  Vaping Use  . Vaping Use: Never used  Substance and Sexual Activity  . Alcohol use: Not Currently  . Drug use: Not Currently  . Sexual activity: Not Currently  Other Topics Concern  . Not on file  Social History Narrative  . Not on file   Social Determinants of Health   Financial Resource Strain: Not on file  Food Insecurity: Not on file  Transportation Needs: Not on file  Physical Activity: Not on file  Stress: Not on file  Social Connections: Not on file  Intimate Partner  Violence: Not on file    Review of Systems: See HPI, otherwise negative ROS  Physical Exam: BP 119/82   Pulse 78   Temp (!) 97.1 F (36.2 C) (Temporal)   Resp (!) 24   Ht 5\' 9"  (1.753 m)   Wt (!) 159.7 kg   SpO2 100%   BMI 51.98 kg/m  General:   Alert,  pleasant and cooperative in NAD Head:  Normocephalic and atraumatic. Neck:  Supple; no masses or thyromegaly. Lungs:  Clear throughout to auscultation.    Heart:  Regular rate and rhythm. Abdomen:  Soft, nontender and nondistended. Normal bowel sounds, without guarding, and without rebound.   Neurologic:  Alert and  oriented x4;  grossly normal neurologically.  Impression/Plan: Marc [name] is here for an flexible sigmoidoscopy to be performed for h/o colon polyp  Risks, benefits, limitations, and alternatives regarding  flexible sigmoidoscopy have been reviewed with the patient.  Questions have been answered.  All parties agreeable.   Sherri Sear, MD  10/30/2020, 9:47 AM

## 2020-10-31 ENCOUNTER — Encounter: Payer: Self-pay | Admitting: Gastroenterology

## 2020-10-31 LAB — SURGICAL PATHOLOGY

## 2020-10-31 NOTE — Anesthesia Postprocedure Evaluation (Signed)
Anesthesia Post Note  Patient: Marc [name]  Procedure(s) Performed: FLEXIBLE SIGMOIDOSCOPY (N/A )  Patient location during evaluation: PACU Anesthesia Type: General Level of consciousness: awake and alert Pain management: pain level controlled Vital Signs Assessment: post-procedure vital signs reviewed and stable Respiratory status: spontaneous breathing, nonlabored ventilation, respiratory function stable and patient connected to nasal cannula oxygen Cardiovascular status: blood pressure returned to baseline and stable Postop Assessment: no apparent nausea or vomiting Anesthetic complications: no   No complications documented.   Last Vitals:  Vitals:   10/30/20 1145 10/30/20 1155  BP: 119/89 129/81  Pulse: 77 71  Resp: 20 (!) 25  Temp:    SpO2: 96% 100%    Last Pain:  Vitals:   10/31/20 0734  TempSrc:   PainSc: 0-No pain                 Molli Barrows

## 2020-11-15 NOTE — Progress Notes (Signed)
[facility] [place], [place] [address]  Pulmonary Sleep Medicine   Office Visit Note  Patient Name: Marc [name] DOB: 1975/12/15 MRN [id number]    Chief Complaint: Obstructive Sleep Apnea visit  Brief History:  Marc [name] is seen today for initial consultation. The patient has a 14 year history of sleep apnea. Patient is using PAP nightly but he is using a borrowed CPAP which is not set to his pressure. His machine broke 1.5 years ago. He tries to go to sleep around 9 but it takes a long while to fall asleep and then sleep is disturbed. The patient  Used to rest well and benefit from CPAP when he had his own machine. He is struggling with the borrowed machine. Reported sleepiness is  worse and the Epworth Sleepiness Score is 15 out of 24. The patient does take naps with CPAP. The compliance is not available. The patient does not complain of limb movements disrupting sleep.  ROS  General: (-) fever, (-) chills, (-) night sweat Nose and Sinuses: (-) nasal stuffiness or itchiness, (-) postnasal drip, (-) nosebleeds, (-) sinus trouble. Mouth and Throat: (-) sore throat, (-) hoarseness. Neck: (-) swollen glands, (-) enlarged thyroid, (-) neck pain. Respiratory: - cough, - shortness of breath, some chronic wheezing, unchanged.  Neurologic: - numbness, - tingling. Psychiatric: - anxiety, - depression   Current Medication: Outpatient Encounter Medications as of 11/18/2020  Medication Sig Note  . albuterol (VENTOLIN HFA) 108 (90 Base) MCG/ACT inhaler Inhale 2 puffs into the lungs every 6 (six) hours as needed for wheezing or shortness of breath.   . allopurinol (ZYLOPRIM) 100 MG tablet Take 100 mg by mouth daily. (Patient not taking: No sig reported) 09/11/2020: Have not started med   . aspirin EC 81 MG tablet Take 81 mg by mouth daily.   . carvedilol (COREG) 6.25 MG tablet Take 1 tablet (6.25 mg total) by mouth 2 (two) times daily.   . cyclobenzaprine (FLEXERIL)  5 MG tablet Take 10 mg by mouth 3 (three) times daily as needed for muscle spasms.   Marland Kitchen lidocaine (LIDODERM) 5 % Place 1 patch onto the skin daily as needed (pain). Remove & Discard patch within 12 hours or as directed by MD   . sacubitril-valsartan (ENTRESTO) 97-103 MG Take 1 tablet by mouth 2 (two) times daily.   Marland Kitchen spironolactone (ALDACTONE) 25 MG tablet Take 1 tablet by mouth once daily (Patient taking differently: Take 25 mg by mouth daily.)   . torsemide (DEMADEX) 20 MG tablet Take 40 mg by mouth 2 (two) times daily.   . vitamin C (ASCORBIC ACID) 500 MG tablet Take 500 mg by mouth daily.    No facility-administered encounter medications on file as of 11/18/2020.    Surgical History: Past Surgical History:  Procedure Laterality Date  . BICEPS TENDON REPAIR    . COLONOSCOPY WITH PROPOFOL N/A 10/09/2020   Procedure: COLONOSCOPY WITH PROPOFOL;  Surgeon: Lin Landsman, MD;  Location: [facility] ENDOSCOPY;  Service: Gastroenterology;  Laterality: N/A;  . FLEXIBLE SIGMOIDOSCOPY N/A 10/30/2020   Procedure: FLEXIBLE SIGMOIDOSCOPY;  Surgeon: Lin Landsman, MD;  Location: [facility] ENDOSCOPY;  Service: Gastroenterology;  Laterality: N/A;  . FOOT SURGERY     metataral and fasciotomy  . RIGHT/LEFT HEART CATH AND CORONARY ANGIOGRAPHY Bilateral 09/19/2020   Procedure: RIGHT/LEFT HEART CATH AND CORONARY ANGIOGRAPHY;  Surgeon: Yolonda Kida, MD;  Location: [facility];  Service: Cardiovascular;  Laterality: Bilateral;  . ROTATOR CUFF REPAIR  Medical History: Past Medical History:  Diagnosis Date  . Bipolar affective ([place])   . CHF (congestive heart failure) ([place])   . Hypertension     Family History: Non contributory to the present illness  Social History: Social History   Socioeconomic History  . Marital status: Married    Spouse name: Not on file  . Number of children: Not on file  . Years of education: Not on file  . Highest education level: Not on file  Occupational  History  . Not on file  Tobacco Use  . Smoking status: Current Every Day Smoker    Packs/day: 0.50    Years: 25.00    Pack years: 12.50    Types: Cigarettes  . Smokeless tobacco: Never Used  Vaping Use  . Vaping Use: Never used  Substance and Sexual Activity  . Alcohol use: Not Currently  . Drug use: Not Currently  . Sexual activity: Not Currently  Other Topics Concern  . Not on file  Social History Narrative  . Not on file   Social Determinants of Health   Financial Resource Strain: Not on file  Food Insecurity: Not on file  Transportation Needs: Not on file  Physical Activity: Not on file  Stress: Not on file  Social Connections: Not on file  Intimate Partner Violence: Not on file    Vital Signs: Blood pressure (!) 140/92, pulse 90, temperature 97.9 F (36.6 C), temperature source Temporal, resp. rate 16, height 5\' 9"  (1.753 m), weight (!) 348 lb (157.9 kg), SpO2 96 %.  Examination: General Appearance: The patient is well-developed, well-nourished, and in no distress. Neck Circumference: 45 Skin: Gross inspection of skin unremarkable. Head: normocephalic, no gross deformities. Eyes: no gross deformities noted. ENT: ears appear grossly normal Neurologic: Alert and oriented. No involuntary movements.    EPWORTH SLEEPINESS SCALE:  Scale:  (0)= no chance of dozing; (1)= slight chance of dozing; (2)= moderate chance of dozing; (3)= high chance of dozing  Chance  Situtation    Sitting and reading: 3    Watching TV: 3    Sitting Inactive in public: 2    As a passenger in car: 2      Lying down to rest: 2    Sitting and talking: 1    Sitting quielty after lunch: 2    In a car, stopped in traffic: 0   TOTAL SCORE:   15 out of 24    SLEEP STUDIES:  PSG 10/18/20 - AHI 82.8,  Low SpO2 89%   CPAP COMPLIANCE DATA:  Not available.        LABS: Recent Results (from the past 2160 hour(s))  Brain natriuretic peptide     Status: None    Collection Time: 09/10/20 12:25 PM  Result Value Ref Range   B Natriuretic Peptide 44.1 0.0 - 100.0 pg/mL    Comment: Performed at [facility], [address] [place], [place] [address]  Calprotectin, Fecal     Status: None   Collection Time: 09/13/20 10:00 AM  Result Value Ref Range   Calprotectin, Fecal <16 0 - 120 ug/g    Comment: Concentration     Interpretation   Follow-Up <16 - 50 ug/g     Normal           None >50 -120 ug/g     Borderline       Re-evaluate in 4-6 weeks     >120 ug/g     Abnormal  Repeat as clinically                                    indicated   SARS CORONAVIRUS 2 (TAT 6-24 HRS) Nasopharyngeal Nasopharyngeal Swab     Status: None   Collection Time: 09/17/20 10:21 AM   Specimen: Nasopharyngeal Swab  Result Value Ref Range   SARS Coronavirus 2 NEGATIVE NEGATIVE    Comment: (NOTE) SARS-CoV-2 target nucleic acids are NOT DETECTED.  The SARS-CoV-2 RNA is generally detectable in upper and lower respiratory specimens during the acute phase of infection. Negative results do not preclude SARS-CoV-2 infection, do not rule out co-infections with other pathogens, and should not be used as the sole basis for treatment or other patient management decisions. Negative results must be combined with clinical observations, patient history, and epidemiological information. The expected result is Negative.  Fact Sheet for Patients: SugarRoll.be  Fact Sheet for Healthcare Providers: https://www.woods-mathews.com/  This test is not yet approved or cleared by the [place] FDA and  has been authorized for detection and/or diagnosis of SARS-CoV-2 by FDA under an Emergency Use Authorization (EUA). This EUA will remain  in effect (meaning this test can be used) for the duration of the COVID-19 declaration under Se ction 564(b)(1) of the Act, 21 U.S.C. section 360bbb-3(b)(1), unless the authorization is terminated  or revoked sooner.  Performed at [facility], [place] [address] [place], [place] [address]   SARS CORONAVIRUS 2 (TAT 6-24 HRS) Nasopharyngeal Nasopharyngeal Swab     Status: None   Collection Time: 10/07/20  8:55 AM   Specimen: Nasopharyngeal Swab  Result Value Ref Range   SARS Coronavirus 2 NEGATIVE NEGATIVE    Comment: (NOTE) SARS-CoV-2 target nucleic acids are NOT DETECTED.  The SARS-CoV-2 RNA is generally detectable in upper and lower respiratory specimens during the acute phase of infection. Negative results do not preclude SARS-CoV-2 infection, do not rule out co-infections with other pathogens, and should not be used as the sole basis for treatment or other patient management decisions. Negative results must be combined with clinical observations, patient history, and epidemiological information. The expected result is Negative.  Fact Sheet for Patients: SugarRoll.be  Fact Sheet for Healthcare Providers: https://www.woods-mathews.com/  This test is not yet approved or cleared by the [place] FDA and  has been authorized for detection and/or diagnosis of SARS-CoV-2 by FDA under an Emergency Use Authorization (EUA). This EUA will remain  in effect (meaning this test can be used) for the duration of the COVID-19 declaration under Se ction 564(b)(1) of the Act, 21 U.S.C. section 360bbb-3(b)(1), unless the authorization is terminated or revoked sooner.  Performed at [facility], [place] [address] [place], [place] [address]   Surgical pathology     Status: None   Collection Time: 10/09/20 10:34 AM  Result Value Ref Range   SURGICAL PATHOLOGY      SURGICAL PATHOLOGY CASE: [id number] PATIENT: Marc [name] Surgical Pathology Report     Specimen Submitted: A. Colon polyp, descending; hs B. Colon polyp x2, sigmoid; cs and hs  Clinical History: Rectal bleeding K62.5, polyps    DIAGNOSIS: A. COLON  POLYP, DESCENDING; HOT SNARE: - TUBULOVILLOUS ADENOMA. - ADENOMATOUS CHANGE FOCALLY EXTENDS TO INKED AND CAUTERIZED POLYP EDGE. - NEGATIVE FOR HIGH-GRADE DYSPLASIA AND MALIGNANCY.  B. COLON POLYPS X2, SIGMOID; COLD SNARE AND HOT SNARE: - MULTIPLE FRAGMENTS OF BENIGN COLONIC MUCOSA WITH SUPERFICIAL  REACTIVE CHANGES. - NEGATIVE FOR DYSPLASIA AND MALIGNANCY.  Comment: Multiple additional deeper recut levels were examined for both tissue blocks of specimen B.  GROSS DESCRIPTION: A. Labeled: Descending colon polyp hot snare Received: Formalin Collection time: 10:34 AM on 10/09/2020 Placed into formalin time: 10:34 AM on 10/09/2020 Tissue fragment(s): Multiple Size: Aggregate, 0.8 x 0.8 x 0.4 cm Description : Received are fragments of tan-pink soft tissue admixed with a scant amount of intestinal debris.  The ratio of soft tissue to intestinal debris is 95: 5.  The largest soft tissue fragment has a resection margin which is inked blue.  This fragment is bisected. Entirely submitted in cassettes 1-2 with the bisected fragment in cassette 1 and the remaining fragments in cassette 2.  B. Labeled: Sigmoid colon polyp x2 cold and hot snare Received: Formalin Collection time: 10:38 AM on 10/09/2020 Placed into formalin time: 10:38 AM on 10/09/2020 Tissue fragment(s): 3 Size: Aggregate, 0.9 x 0.6 x 0.5 cm Description: Received are 2 fragments of tan-pink soft tissue admixed with a fragment of intestinal debris.  The ratio of soft tissue to intestinal debris is 95: 5.  The largest soft tissue fragment has a resection margin which is inked black.  This fragment is trisected. Entirely submitted in cassettes 1-2 with the trisected fragment in cassette 1 and the remaining fragments i n cassette 2.  Final Diagnosis performed by Allena Napoleon, MD.   Electronically signed 10/11/2020 9:55:11AM The electronic signature indicates that the named Attending Pathologist has evaluated the  specimen Technical component performed at [facility], [address] [place], [place] [address] Lab: [phone] Dir: Rush Farmer, MD, MMM  Professional component performed at [facility], [facility], [place], [place], [place] [address] Lab: [phone] Dir: Dellia Nims. Rubinas, MD   SARS CORONAVIRUS 2 (TAT 6-24 HRS) Nasopharyngeal Nasopharyngeal Swab     Status: None   Collection Time: 10/28/20 10:27 AM   Specimen: Nasopharyngeal Swab  Result Value Ref Range   SARS Coronavirus 2 NEGATIVE NEGATIVE    Comment: (NOTE) SARS-CoV-2 target nucleic acids are NOT DETECTED.  The SARS-CoV-2 RNA is generally detectable in upper and lower respiratory specimens during the acute phase of infection. Negative results do not preclude SARS-CoV-2 infection, do not rule out co-infections with other pathogens, and should not be used as the sole basis for treatment or other patient management decisions. Negative results must be combined with clinical observations, patient history, and epidemiological information. The expected result is Negative.  Fact Sheet for Patients: SugarRoll.be  Fact Sheet for Healthcare Providers: https://www.woods-mathews.com/  This test is not yet approved or cleared by the [place] FDA and  has been authorized for detection and/or diagnosis of SARS-CoV-2 by FDA under an Emergency Use Authorization (EUA). This EUA will remain  in effect (meaning this test can be used) for the duration of the COVID-19 declaration under Se ction 564(b)(1) of the Act, 21 U.S.C. section 360bbb-3(b)(1), unless the authorization is terminated or revoked sooner.  Performed at [facility], [place] [address], [place],  [address]   Surgical pathology     Status: None   Collection Time: 10/30/20 11:23 AM  Result Value Ref Range   SURGICAL PATHOLOGY      SURGICAL PATHOLOGY CASE: [id number] PATIENT: Marc  [name] Surgical Pathology Report     Specimen Submitted: A. Colon polypectomy, desc base; cbx  Clinical History: Rectal bleeding K62.5      DIAGNOSIS: A. COLON POLYP BASE, DESCENDING COLON; POLYPECTOMY: - COLONIC MUCOSA WITH  NO SIGNIFICANT PATHOLOGIC ALTERATION. - NEGATIVE FOR DYSPLASIA AND MALIGNANCY.   GROSS DESCRIPTION: A. Labeled: cbx polypectomy base of descending colon Received: Formalin Collection time: 11:23 AM on 10/30/2020 Placed into formalin time: 11:23 AM on 10/30/2020 Tissue fragment(s): Multiple Size: Aggregate, 0.8 x 0.7 x 0.2 cm Description: Tan soft tissue fragments Entirely submitted in 1 cassette.  RB 10/30/2020   Final Diagnosis performed by Betsy Pries, MD.   Electronically signed 10/31/2020 9:53:50AM The electronic signature indicates that the named Attending Pathologist has evaluated the specimen Technical component performed at [facility], [address], [place] Lab: [phone] Dir: Rush Farmer, MD, MMM  Professional component performed at [facility], [facility], [place], [place], [place] [address] Lab: [phone] Dir: Dellia Nims. Reuel Derby, MD     Radiology: No results found.  No results found.  No results found.    Assessment and Plan: Patient Active Problem List   Diagnosis Date Noted  . OSA on CPAP 11/18/2020  . CPAP use counseling 11/18/2020  . Morbid obesity ([place]) 11/18/2020  . BMI 50.0-59.9, adult ([place]) 11/18/2020  . Congestive heart failure ([place]) 11/18/2020  . Tobacco user 11/18/2020  . H/O colonoscopy with polypectomy   . Rectal bleeding   . 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 Chest pain 10/09/2018   1. OSA on CPAP The patient does tolerate PAP and reports  benefit from PAP use when using his old machine but is not doing well on the borrowed CPAP.  The study results, diagnosis and treatment recommendations were discussed with the patient  nasal CPAP at 12 cm H2O is  recommended. OSA- CPAP 12 cm H2O will be ordered. Follow up 30+days after set up.    2. CPAP use counseling CPAP Counseling: had a lengthy discussion with the patient regarding the importance of PAP therapy in management of the sleep apnea. Patient appears to understand the risk factor reduction and also understands the risks associated with untreated sleep apnea. Patient will try to make a good faith effort to remain compliant with therapy. Also instructed the patient on proper cleaning of the device including the water must be changed daily if possible and use of distilled water is preferred. Patient understands that the machine should be regularly cleaned with appropriate recommended cleaning solutions that do not damage the PAP machine for example given white vinegar and water rinses. Other methods such as ozone treatment may not be as good as these simple methods to achieve cleaning.  3. Morbid obesity ([place]) Obesity Counseling: Had a lengthy discussion regarding patients BMI and weight issues. Patient was instructed on portion control as well as increased activity. Also discussed caloric restrictions with trying to maintain intake less than 2000 Kcal. Discussions were made in accordance with the 5As of weight management. Simple actions such as not eating late and if able to, taking a walk is suggested.  4. BMI 50.0-59.9, adult ([place]) Set weight goal, he is already working on diet.   5. Congestive heart failure, unspecified HF chronicity, unspecified heart failure type ([place]) Stable, compensated, continue with medications including entresto, torsemide, spirinolactone.   6. Tobacco user Smoking cessation counseling: 1. Pt acknowledges the risks of long term smoking, she will try to quite smoking. 2. Options for different medications including nicotine products, chewing gum, patch etc, Wellbutrin and Chantix is discussed 3. Goal and date of compete cessation is discussed 4. Total time spent in  smoking cessation is 10 min.    The patient does tolerate PAP and  reports  benefit from PAP use when using his old machine but is not doing well on the borrowed CPAP.  The study results, diagnosis and treatment recommendations were discussed with the patient  nasal CPAP at 12 cm H2O is recommended.  5. OSA- CPAP 12 cm H2O will be ordered. Follow up 30+days after set up.  General Counseling: I have discussed the findings of the evaluation and examination with Marc [name].  I have also discussed any further diagnostic evaluation thatmay be needed or ordered today. Marc [name] verbalizes understanding of the findings of todays visit. We also reviewed his medications today and discussed drug interactions and side effects including but not limited excessive drowsiness and altered mental states. We also discussed that there is always a risk not just to him but also people around him. he has been encouraged to call the office with any questions or concerns that should arise related to todays visit.  No orders of the defined types were placed in this encounter.       I have personally obtained a history, examined the patient, evaluated laboratory and imaging results, formulated the assessment and plan and placed orders.  This patient was seen today by Tressie Ellis, PA-C in collaboration with Dr. Devona Konig.   Richelle Ito Saunders Glance, PhD, FAASM  Diplomate, American Board of Sleep Medicine    Allyne Gee, MD [facility] Diplomate ABMS Pulmonary and Critical Care Medicine Sleep medicine

## 2020-11-18 ENCOUNTER — Ambulatory Visit (INDEPENDENT_AMBULATORY_CARE_PROVIDER_SITE_OTHER): Payer: Medicare Other | Admitting: Internal Medicine

## 2020-11-18 VITALS — BP 140/92 | HR 90 | Temp 97.9°F | Resp 16 | Ht 69.0 in | Wt 348.0 lb

## 2020-11-18 DIAGNOSIS — Z7189 Other specified counseling: Secondary | ICD-10-CM | POA: Insufficient documentation

## 2020-11-18 DIAGNOSIS — Z9989 Dependence on other enabling machines and devices: Secondary | ICD-10-CM

## 2020-11-18 DIAGNOSIS — Z6841 Body Mass Index (BMI) 40.0 and over, adult: Secondary | ICD-10-CM | POA: Insufficient documentation

## 2020-11-18 DIAGNOSIS — G4733 Obstructive sleep apnea (adult) (pediatric): Secondary | ICD-10-CM

## 2020-11-18 DIAGNOSIS — Z720 Tobacco use: Secondary | ICD-10-CM | POA: Diagnosis not present

## 2020-11-18 DIAGNOSIS — I509 Heart failure, unspecified: Secondary | ICD-10-CM | POA: Diagnosis not present

## 2020-11-18 NOTE — Patient Instructions (Signed)

## 2020-12-09 ENCOUNTER — Other Ambulatory Visit: Payer: Self-pay

## 2020-12-09 ENCOUNTER — Ambulatory Visit: Payer: Medicare Other | Attending: Family | Admitting: Family

## 2020-12-09 ENCOUNTER — Encounter: Payer: Self-pay | Admitting: Family

## 2020-12-09 VITALS — BP 136/87 | HR 87 | Resp 18 | Ht 69.0 in | Wt 341.4 lb

## 2020-12-09 DIAGNOSIS — F411 Generalized anxiety disorder: Secondary | ICD-10-CM | POA: Diagnosis not present

## 2020-12-09 DIAGNOSIS — Z79899 Other long term (current) drug therapy: Secondary | ICD-10-CM | POA: Insufficient documentation

## 2020-12-09 DIAGNOSIS — F329 Major depressive disorder, single episode, unspecified: Secondary | ICD-10-CM | POA: Diagnosis not present

## 2020-12-09 DIAGNOSIS — F17210 Nicotine dependence, cigarettes, uncomplicated: Secondary | ICD-10-CM | POA: Insufficient documentation

## 2020-12-09 DIAGNOSIS — Z720 Tobacco use: Secondary | ICD-10-CM

## 2020-12-09 DIAGNOSIS — G4733 Obstructive sleep apnea (adult) (pediatric): Secondary | ICD-10-CM

## 2020-12-09 DIAGNOSIS — F419 Anxiety disorder, unspecified: Secondary | ICD-10-CM

## 2020-12-09 DIAGNOSIS — I110 Hypertensive heart disease with heart failure: Secondary | ICD-10-CM | POA: Diagnosis not present

## 2020-12-09 DIAGNOSIS — Z7982 Long term (current) use of aspirin: Secondary | ICD-10-CM | POA: Insufficient documentation

## 2020-12-09 DIAGNOSIS — Z9989 Dependence on other enabling machines and devices: Secondary | ICD-10-CM

## 2020-12-09 DIAGNOSIS — I5022 Chronic systolic (congestive) heart failure: Secondary | ICD-10-CM

## 2020-12-09 DIAGNOSIS — Z8249 Family history of ischemic heart disease and other diseases of the circulatory system: Secondary | ICD-10-CM | POA: Diagnosis not present

## 2020-12-09 DIAGNOSIS — I10 Essential (primary) hypertension: Secondary | ICD-10-CM

## 2020-12-09 DIAGNOSIS — I509 Heart failure, unspecified: Secondary | ICD-10-CM | POA: Insufficient documentation

## 2020-12-09 DIAGNOSIS — F32A Depression, unspecified: Secondary | ICD-10-CM

## 2020-12-09 NOTE — Progress Notes (Signed)
[facility] - PHARMACIST COUNSELING NOTE  ADHERENCE ASSESSMENT  Adherence strategy: Keeps medications at bedside to remember first thing in the morning   Do you ever forget to take your medication? [x] Yes (1) [] No (0)  Do you ever skip doses due to side effects? [] Yes (1) [x] No (0)  Do you have trouble affording your medicines? [] Yes (1) [x] No (0)  Are you ever unable to pick up your medication due to transportation difficulties? [] Yes (1) [x] No (0)  Do you ever stop taking your medications because you don't believe they are helping? [] Yes (1) [x] No (0)  Total score _0______    Recommendations given to patient about increasing adherence: Pt reports missing frequent evening doses of medications. Pt is in the process of switching insurances which has been difficult for picking up medications. Pt has about a weeks worth of medication remaining.   Guideline-Directed Medical Therapy/Evidence Based Medicine  ACE/ARB/ARNI: Entresto 97-103 mg twice daily Beta Blocker: carvedilol 6.25 mg twice daily  Aldosterone Antagonist: spironolactone 25 mg daily Diuretic: torsemide 40 mg twice daily     SUBJECTIVE  HPI:  Past Medical History:  Diagnosis Date  . Bipolar affective ([place])   . CHF (congestive heart failure) ([place])   . Hypertension         OBJECTIVE   Vital signs: HR 87, BP 136/87, weight 154.8 kg ECHO: Date 10/10/18, EF 20-25%, notes moderate LVH, moderately dilated LA size Cath: Date 09/19/20, EF < 25%  BMP Latest Ref Rng & Units 01/08/2020 11/10/2019 10/19/2019  Glucose 70 - 99 mg/dL 99 100(H) 102(H)  BUN 6 - 20 mg/dL 12 10 13   Creatinine 0.61 - 1.24 mg/dL 0.86 0.87 0.97  Sodium 135 - 145 mmol/L 139 140 137  Potassium 3.5 - 5.1 mmol/L 4.4 4.1 3.8  Chloride 98 - 111 mmol/L 107 102 102  CO2 22 - 32 mmol/L 24 28 26   Calcium 8.9 - 10.3 mg/dL 9.3 9.1 8.7(L)    ASSESSMENT 45 yo M presenting to heart failure clinic for follow-up visit.  PMH includes CHF and HTN. Pt reports missing frequent evening doses of medications, recommended setting alarms to help remember. Pt notices it is harder to breathe the next morning when forgets evening dose of torsemide. Additionally, pt is in the process of switching insurances which has been difficult for picking up medications. Pt has about a weeks worth of medication remaining. Informed pt to let us know if insurance does not get sorted prior to running out of medications.   PLAN CHF/HTN - Continue Entresto 97-103 mg twice daily  - Continue carvedilol 6.25 mg twice daily  - Continue torsemide 40 mg twice daily  - Continue spironolactone 25 mg daily   Pain/Gout - Continue allopurinol 100 mg daily  - Continue lidocaine 5% patch daily as needed  - Continue cyclobenzaprine 10 mg three times daily  Shortness of breath - Continue albuterol inhaler use as needed   General Health  - Continue aspirin 81 mg daily  - Continue vitamin C 500 mg daily  Time spent: 15 minutes  Benn Moulder, PharmD Pharmacy Resident  12/09/2020 10:40 AM    Current Outpatient Medications:  .  albuterol (VENTOLIN HFA) 108 (90 Base) MCG/ACT inhaler, Inhale 2 puffs into the lungs every 6 (six) hours as needed for wheezing or shortness of breath., Disp: , Rfl:  .  allopurinol (ZYLOPRIM) 100 MG tablet, Take 100 mg by mouth daily. (Patient not taking: No sig reported), Disp: ,  Rfl:  .  aspirin EC 81 MG tablet, Take 81 mg by mouth daily., Disp: , Rfl:  .  carvedilol (COREG) 6.25 MG tablet, Take 1 tablet (6.25 mg total) by mouth 2 (two) times daily., Disp: 180 tablet, Rfl: 3 .  cyclobenzaprine (FLEXERIL) 5 MG tablet, Take 10 mg by mouth 3 (three) times daily as needed for muscle spasms., Disp: , Rfl:  .  lidocaine (LIDODERM) 5 %, Place 1 patch onto the skin daily as needed (pain). Remove & Discard patch within 12 hours or as directed by MD, Disp: , Rfl:  .  sacubitril-valsartan (ENTRESTO) 97-103 MG, Take 1  tablet by mouth 2 (two) times daily., Disp: 180 tablet, Rfl: 3 .  spironolactone (ALDACTONE) 25 MG tablet, Take 1 tablet by mouth once daily (Patient taking differently: Take 25 mg by mouth daily.), Disp: 90 tablet, Rfl: 3 .  torsemide (DEMADEX) 20 MG tablet, Take 40 mg by mouth 2 (two) times daily., Disp: , Rfl:  .  vitamin C (ASCORBIC ACID) 500 MG tablet, Take 500 mg by mouth daily., Disp: , Rfl:    COUNSELING POINTS/CLINICAL PEARLS Carvedilol (Goal: weight less than 85 kg is 25 mg BID, weight greater than 85 kg is 50 mg BID)  Patient should avoid activities requiring coordination until drug effects are realized, as drug may cause dizziness.  This drug may cause diarrhea, nausea, vomiting, arthralgia, back pain, myalgia, headache, vision disorder, erectile dysfunction, reduced libido, or fatigue.  Instruct patient to report signs/symptoms of adverse cardiovascular effects such as hypotension (especially in elderly patients), arrhythmias, syncope, palpitations, angina, or edema.  Drug may mask symptoms of hypoglycemia. Advise diabetic patients to carefully monitor blood sugar levels.  Patient should take drug with food.  Advise patient against sudden discontinuation of drug. Entresto (Goal: 97/103 mg twice daily)  Warn male patient to avoid pregnancy during therapy and to report a pregnancy to a physician.  Advise patient to report symptomatic hypotension.  Side effects may include hyperkalemia, cough, dizziness, or renal failure. Torsemide  Side effects may include excessive urination.  Tell patient to report symptoms of ototoxicity.  Instruct patient to report lightheadedness or syncope.  Warn patient to avoid use of nonprescription NSAID products without first discussing it with their healthcare provider. Spironolactone  Warn patient to report dehydration, hypotension, or symptoms of worsening renal function.  Counsel male patient to report gynecomastia.  Side effects may include  diarrhea, nausea, vomiting, abdominal cramping, fever, leg cramps, lethargy, mental confusion, decreased libido, irregular menses, and rash. Suspension: Tell patient to take drug consistently with respect to food, either before or after a meal.  Advise patient to avoid potassium supplements and foods containing high levels of potassium, including salt substitutes.  DRUGS TO AVOID IN HEART FAILURE  Drug or Class Mechanism  Analgesics . NSAIDs . COX-2 inhibitors . Glucocorticoids  Sodium and water retention, increased systemic vascular resistance, decreased response to diuretics   Diabetes Medications . Metformin . Thiazolidinediones o Rosiglitazone (Avandia) o Pioglitazone (Actos) . DPP4 Inhibitors o Saxagliptin (Onglyza) o Sitagliptin (Januvia)   Lactic acidosis Possible calcium channel blockade   Unknown  Antiarrhythmics . Class I  o Flecainide o Disopyramide . Class III o Sotalol . Other o Dronedarone  Negative inotrope, proarrhythmic   Proarrhythmic, beta blockade  Negative inotrope  Antihypertensives . Alpha Blockers o Doxazosin . Calcium Channel Blockers o Diltiazem o Verapamil o Nifedipine . Central Alpha Adrenergics o Moxonidine . Peripheral Vasodilators o Minoxidil  Increases renin and aldosterone  Negative  inotrope    Possible sympathetic withdrawal  Unknown  Anti-infective . Itraconazole . Amphotericin B  Negative inotrope Unknown  Hematologic . Anagrelide . Cilostazol   Possible inhibition of PD IV Inhibition of PD III causing arrhythmias  Neurologic/Psychiatric . Stimulants . Anti-Seizure Drugs o Carbamazepine o Pregabalin . Antidepressants o Tricyclics o Citalopram . Parkinsons o Bromocriptine o Pergolide o Pramipexole . Antipsychotics o Clozapine . Antimigraine o Ergotamine o Methysergide . Appetite suppressants . Bipolar o Lithium  Peripheral alpha and beta agonist activity  Negative inotrope and  chronotrope Calcium channel blockade  Negative inotrope, proarrhythmic Dose-dependent QT prolongation  Excessive serotonin activity/valvular damage Excessive serotonin activity/valvular damage Unknown  IgE mediated hypersensitivy, calcium channel blockade  Excessive serotonin activity/valvular damage Excessive serotonin activity/valvular damage Valvular damage  Direct myofibrillar degeneration, adrenergic stimulation  Antimalarials . Chloroquine . Hydroxychloroquine Intracellular inhibition of lysosomal enzymes  Urologic Agents . Alpha Blockers o Doxazosin o Prazosin o Tamsulosin o Terazosin  Increased renin and aldosterone  Adapted from Page RL, et al. "Drugs That May Cause or Exacerbate Heart Failure: A Scientific Statement from the [place]." Circulation 2016; 846:K59-D35. DOI: 10.1161/CIR.0000000000000426   MEDICATION ADHERENCES TIPS AND STRATEGIES 1. Taking medication as prescribed improves patient outcomes in heart failure (reduces hospitalizations, improves symptoms, increases survival) 2. Side effects of medications can be managed by decreasing doses, switching agents, stopping drugs, or adding additional therapy. Please let someone in the [facility] know if you have having bothersome side effects so we can modify your regimen. Do not alter your medication regimen without talking to [place].  3. Medication reminders can help patients remember to take drugs on time. If you are missing or forgetting doses you can try linking behaviors, using pill boxes, or an electronic reminder like an alarm on your phone or an app. Some people can also get automated phone calls as medication reminders.

## 2020-12-09 NOTE — Progress Notes (Signed)
Patient ID: Marc [name], male    DOB: 1976/01/13, 45 y.o.   MRN: [id number]   [name] is a 45 y/o male with a history of HTN, chronic pain, bipolar, current tobacco use and chronic heart failure.   Echo report from 10/10/2018 reviewed and showed an EF of 20-25% along with moderate MR/TR.   RHC/LHC done 09/19/20 and showed:  Severely depressed overall left ventricular function EF less than 25%  Severely enlarged left ventricular chamber  Left main large free of disease  LAD was large and free of disease  Circumflex was large and free of disease left dominant  Right heart cath showed no evidence of pulmonary hypertension  Mean PA was 23 mean wedge of 10  Cardiac output of 7.6 Fick  Was in the ED 05/20/20 due to acute gout of left ankle where he was evaluated and released.   He presents today for a follow-up visit with a chief complaint of moderate fatigue with little exertion. He describes this as chronic in nature having been present for several months. He has associated decreased appetite, shortness of breath, headaches, head congestion, intermittent chest pain, pedal edema, chronic pain, depression and anxiety along with this. He denies any difficulty sleeping, abdominal distention, palpitations, cough, dizziness or weight gain.   Is in the process of getting his medications switched to [facility] as well as changing his PCP due to his new insurance that he received from his disability. Does report some stress with this process.    Past Medical History:  Diagnosis Date  . Bipolar affective ([place])   . CHF (congestive heart failure) ([place])   . Hypertension    Past Surgical History:  Procedure Laterality Date  . BICEPS TENDON REPAIR    . COLONOSCOPY WITH PROPOFOL N/A 10/09/2020   Procedure: COLONOSCOPY WITH PROPOFOL;  Surgeon: Lin Landsman, MD;  Location: [facility] ENDOSCOPY;  Service: Gastroenterology;  Laterality: N/A;  . FLEXIBLE SIGMOIDOSCOPY N/A 10/30/2020   Procedure:  FLEXIBLE SIGMOIDOSCOPY;  Surgeon: Lin Landsman, MD;  Location: [facility] ENDOSCOPY;  Service: Gastroenterology;  Laterality: N/A;  . FOOT SURGERY     metataral and fasciotomy  . RIGHT/LEFT HEART CATH AND CORONARY ANGIOGRAPHY Bilateral 09/19/2020   Procedure: RIGHT/LEFT HEART CATH AND CORONARY ANGIOGRAPHY;  Surgeon: Yolonda Kida, MD;  Location: [facility] CV LAB;  Service: Cardiovascular;  Laterality: Bilateral;  . ROTATOR CUFF REPAIR     Family History  Problem Relation Age of Onset  . Stroke Mother   . Dementia Mother   . Hypertension Father   . Diabetes Father    Social History   Tobacco Use  . Smoking status: Current Every Day Smoker    Packs/day: 0.50    Years: 25.00    Pack years: 12.50    Types: Cigarettes  . Smokeless tobacco: Never Used  Substance Use Topics  . Alcohol use: Not Currently   Allergies  Allergen Reactions  . Geodon [Ziprasidone] Other (See Comments)    paralysis  . Tramadol Other (See Comments)    Pt stated that it gave him sores.  . Amoxicillin Rash  . Zithromax [Azithromycin] Rash   Prior to Admission medications   Medication Sig Start Date End Date Taking? Authorizing Provider  albuterol (VENTOLIN HFA) 108 (90 Base) MCG/ACT inhaler Inhale 2 puffs into the lungs every 6 (six) hours as needed for wheezing or shortness of breath.   Yes [provider]  allopurinol (ZYLOPRIM) 100 MG tablet Take 100 mg by mouth daily.  08/29/20  Yes [provider]  aspirin EC 81 MG tablet Take 81 mg by mouth daily.   Yes [provider]  carvedilol (COREG) 6.25 MG tablet Take 1 tablet (6.25 mg total) by mouth 2 (two) times daily. 08/12/20  Yes Kelena Garrow, Otila Kluver A, FNP  cyclobenzaprine (FLEXERIL) 5 MG tablet Take 10 mg by mouth 3 (three) times daily as needed for muscle spasms.   Yes [provider]  lidocaine (LIDODERM) 5 % Place 1 patch onto the skin daily as needed (pain). Remove & Discard patch within 12 hours or as directed by  MD   Yes [provider]  sacubitril-valsartan (ENTRESTO) 97-103 MG Take 1 tablet by mouth 2 (two) times daily. 11/10/19  Yes Darylene Price A, FNP  spironolactone (ALDACTONE) 25 MG tablet Take 1 tablet by mouth once daily Patient taking differently: Take 25 mg by mouth daily. 07/24/20  Yes Daelon Dunivan, Otila Kluver A, FNP  torsemide (DEMADEX) 20 MG tablet Take 40 mg by mouth 2 (two) times daily.   Yes [provider]  vitamin C (ASCORBIC ACID) 500 MG tablet Take 500 mg by mouth daily.   Yes [provider]   Review of Systems  Constitutional: Positive for appetite change (decreased) and fatigue (tired "all the time").  HENT: Positive for congestion. Negative for postnasal drip and sore throat.   Eyes: Negative.   Respiratory: Positive for shortness of breath. Negative for cough.   Cardiovascular: Positive for chest pain (intermittent) and leg swelling. Negative for palpitations.  Gastrointestinal: Negative for abdominal distention and abdominal pain.  Endocrine: Negative.   Genitourinary: Negative.   Musculoskeletal: Positive for arthralgias (legs), back pain and neck pain.  Skin: Negative.   Allergic/Immunologic: Negative.   Neurological: Positive for headaches. Negative for dizziness and light-headedness.  Hematological: Negative for adenopathy. Does not bruise/bleed easily.  Psychiatric/Behavioral: Positive for dysphoric mood. Negative for sleep disturbance (wearing CPAP). The patient is nervous/anxious.    Vitals:   12/09/20 1004  BP: 136/87  Pulse: 87  Resp: 18  SpO2: 99%  Weight: (!) 341 lb 6 oz (154.8 kg)  Height: 5\' 9"  (1.753 m)   Wt Readings from Last 3 Encounters:  12/09/20 (!) 341 lb 6 oz (154.8 kg)  11/18/20 (!) 348 lb (157.9 kg)  10/30/20 (!) 352 lb (159.7 kg)   Lab Results  Component Value Date   CREATININE 0.86 01/08/2020   CREATININE 0.87 11/10/2019   CREATININE 0.97 10/19/2019    Physical Exam Vitals and nursing note reviewed.  HENT:      Head: Normocephalic and atraumatic.  Cardiovascular:     Rate and Rhythm: Normal rate and regular rhythm.  Pulmonary:     Effort: Pulmonary effort is normal.     Breath sounds: No wheezing or rales.     Comments: Diminished due to chest wall size Abdominal:     General: There is no distension.     Palpations: Abdomen is soft.     Tenderness: There is no abdominal tenderness.  Musculoskeletal:        General: No tenderness.     Cervical back: Normal range of motion and neck supple.     Right lower leg: No tenderness. No edema.     Left lower leg: No tenderness. No edema.  Skin:    General: Skin is warm and dry.  Neurological:     General: No focal deficit present.     Mental Status: He is alert and oriented to person, place, and time.  Psychiatric:        Attention and Perception: Attention normal.        Mood and Affect: Mood is anxious. Mood is not depressed.        Behavior: Behavior normal.     Assessment & Plan:  1. Chronic heart failure with reduced ejection fraction- - NYHA class III - euvolemic today - weighing daily; reminded to call for an overnight weight gain of >2 pounds or a weekly weight gain of >5 pounds - weight down 3 pounds from last visit here 3 months ago - admits to not being very active because of his sciatica pain - not adding salt to his food and has been reading food labels - saw cardiology Dema Severin) 10/01/20 - on GDMT of carvedilol, entresto & spironolactone - discussed adding SGLT but he would like to wait and get all his meds switched to [facility] - admits to forgetting his PM meds "often" and doesn't want to take his diuretic at supper time; instructed that he could take his torsemide around 2pm (takes AM meds ~ 6am) and then take his other evening meds at suppertime; also discussed using an alarm to help remind him - BNP 10/19/19 was 101.0 - participating in paramedicine program - PharmD reconciled medications w/ the patient - discuss going to  [facility]  2: HTN- - BP looks good today - saw PCP at [facility]; will be seeing Kathrine Haddock NP in the future; encouraged him to call and get an appointment set up - CMP from 09/10/20 reviewed and showed sodium 136, potassium 3.9, creatinine 0.8 and GFR 126  3: Anxiety/ depression- - patient says that he's talked to the therapist at [place] has been approved and he now has his insurance in place - working on getting his meds switched to mail order [facility]  4: Tobacco use- - smoking ~ 1/2 ppd of cigarettes - denies alcohol or drug use - not interested in stopping completely at this time - complete cessation discussed for 3 minutes with the patient.   5: Obstructive sleep apnea- - saw pulmonology Humphrey Rolls) 11/18/20 - getting new CPAP equipment on 12/18/20   Patient did not bring his medications nor a list. Each medication was verbally reviewed with the patient and he was encouraged to bring the bottles to every visit to confirm accuracy of list.  Return in 1 month or sooner for any questions/problems before then.

## 2020-12-09 NOTE — Patient Instructions (Addendum)
Continue weighing daily and call for an overnight weight gain of > 2 pounds or a weekly weight gain of >5 pounds.   Take 2nd dose of torsemide around 2pm.    Take your carvedilol and entresto still at suppertime.    Call [facility] office to schedule a new appointment.

## 2020-12-10 ENCOUNTER — Ambulatory Visit: Payer: Medicare Other | Admitting: Gastroenterology

## 2020-12-18 DIAGNOSIS — G4733 Obstructive sleep apnea (adult) (pediatric): Secondary | ICD-10-CM | POA: Diagnosis not present

## 2020-12-19 ENCOUNTER — Other Ambulatory Visit: Payer: Self-pay

## 2020-12-19 ENCOUNTER — Encounter: Payer: Self-pay | Admitting: Nurse Practitioner

## 2020-12-19 ENCOUNTER — Ambulatory Visit (INDEPENDENT_AMBULATORY_CARE_PROVIDER_SITE_OTHER): Payer: Medicare Other | Admitting: Nurse Practitioner

## 2020-12-19 VITALS — BP 105/75 | HR 72 | Temp 98.4°F | Ht 68.0 in | Wt 346.6 lb

## 2020-12-19 DIAGNOSIS — Z9989 Dependence on other enabling machines and devices: Secondary | ICD-10-CM | POA: Diagnosis not present

## 2020-12-19 DIAGNOSIS — Z720 Tobacco use: Secondary | ICD-10-CM

## 2020-12-19 DIAGNOSIS — G4733 Obstructive sleep apnea (adult) (pediatric): Secondary | ICD-10-CM

## 2020-12-19 DIAGNOSIS — M1A9XX0 Chronic gout, unspecified, without tophus (tophi): Secondary | ICD-10-CM | POA: Diagnosis not present

## 2020-12-19 DIAGNOSIS — Z7689 Persons encountering health services in other specified circumstances: Secondary | ICD-10-CM

## 2020-12-19 DIAGNOSIS — I10 Essential (primary) hypertension: Secondary | ICD-10-CM | POA: Diagnosis not present

## 2020-12-19 DIAGNOSIS — I509 Heart failure, unspecified: Secondary | ICD-10-CM | POA: Diagnosis not present

## 2020-12-19 DIAGNOSIS — Z6841 Body Mass Index (BMI) 40.0 and over, adult: Secondary | ICD-10-CM | POA: Diagnosis not present

## 2020-12-19 DIAGNOSIS — M109 Gout, unspecified: Secondary | ICD-10-CM | POA: Insufficient documentation

## 2020-12-19 MED ORDER — POLYMYXIN B-TRIMETHOPRIM 10000-0.1 UNIT/ML-% OP SOLN: 1.0000 [drp] | Freq: Four times a day (QID) | OPHTHALMIC | 0 refills | Status: DC

## 2020-12-19 MED ORDER — ALBUTEROL SULFATE HFA 108 (90 BASE) MCG/ACT IN AERS: 2.0000 | INHALATION_SPRAY | Freq: Four times a day (QID) | RESPIRATORY_TRACT | 1 refills | Status: DC | PRN

## 2020-12-19 MED ORDER — ALLOPURINOL 100 MG PO TABS: 100.0000 mg | ORAL_TABLET | Freq: Every day | ORAL | 1 refills | Status: DC

## 2020-12-19 NOTE — Assessment & Plan Note (Signed)
Chronic.  Begin allopurinol daily.  Side effects and benefits discussed during visit.  Follow up in 3 months.

## 2020-12-19 NOTE — Assessment & Plan Note (Signed)
Chronic.  Weight loss surgery discussed during visit.  Recommend patient find out from Cardiology if they would clear him for surgery. If they agree, will refer patient to weight loss clinic.  

## 2020-12-19 NOTE — Patient Instructions (Signed)
Heart Failure, Self-Care Heart failure is a serious condition. The following information explains things you need to do to take care of yourself at home. To help you stay as healthy as possible, you may be asked to change your diet, take certain medicines, and make other changes in your life. Your doctor may also give you more specific instructions. If you have problems or questions, call your doctor. What are the risks? Having heart failure makes it more likely for you to have some problems. These problems can get worse if you do not take good care of yourself. Problems may include:  Damage to the kidneys, liver, or lungs.  Malnutrition.  Abnormal heart rhythms.  Blood clotting problems that could cause a stroke. Supplies needed:  Scale for weighing yourself.  Blood pressure monitor.  Notebook.  Medicines. How to care for yourself when you have heart failure Medicines Take over-the-counter and prescription medicines only as told by your doctor. Take your medicines every day.  Do not stop taking your medicine unless your doctor tells you to do so.  Do not skip any medicines.  Get your prescriptions refilled before you run out of medicine. This is important.  Talk with your doctor if you cannot afford your medicines. Eating and drinking  Eat heart-healthy foods. Talk with a diet specialist (dietitian) to create an eating plan.  Limit salt (sodium) if told by your doctor. Ask your diet specialist to tell you which seasonings are healthy for your heart.  Cook in healthy ways instead of frying. Healthy ways of cooking include roasting, grilling, broiling, baking, poaching, steaming, and stir-frying.  Choose foods that: ? Have no trans fat. ? Are low in saturated fat and cholesterol.  Choose healthy foods, such as: ? Fresh or frozen fruits and vegetables. ? Fish. ? Low-fat (lean) meats. ? Legumes, such as beans, peas, and lentils. ? Fat-free or low-fat dairy  products. ? Whole-grain foods. ? High-fiber foods.  Limit how much fluid you drink, if told by your doctor.   Alcohol use  Do not drink alcohol if: ? Your doctor tells you not to drink. ? Your heart was damaged by alcohol, or you have very bad heart failure. ? You are pregnant, may be pregnant, or are planning to become pregnant.  If you drink alcohol: ? Limit how much you have to:  0-1 drink a day for women.  0-2 drinks a day for men. ? Know how much alcohol is in your drink. In the [place] one drink equals one 12 oz bottle of beer (355 mL), one 5 oz glass of wine (148 mL), or one 1 oz glass of hard liquor (44 mL). Lifestyle  Do not smoke or use any products that contain nicotine or tobacco. If you need help quitting, ask your doctor. ? Do not use nicotine gum or patches before talking to your doctor.  Do not use illegal drugs.  Lose weight if told by your doctor.  Do physical activity if told by your doctor. Talk to your doctor before you begin an exercise if: ? You are an older adult. ? You have very bad heart failure.  Learn to manage stress. If you need help, ask your doctor.  Get physical rehab (rehabilitation) to help you stay independent and to help with your quality of life.  Participate in a cardiac rehab program. This program helps you improve your health through exercise, education, and counseling.  Plan time to rest when you get tired.   Check weight   and blood pressure  Weigh yourself every day. This will help you to know if fluid is building up in your body. ? Weigh yourself every morning after you pee (urinate) and before you eat breakfast. ? Wear the same amount of clothing each time. ? Write down your daily weight. Give your record to your doctor.  Check and write down your blood pressure as told by your doctor.  Check your pulse as told by your doctor.   Dealing with very hot and very cold weather  If it is very hot: ? Avoid activities that take a  lot of energy. ? Use air conditioning or fans, or find a cooler place. ? Avoid caffeine and alcohol. ? Wear clothing that is loose-fitting, lightweight, and light-colored.  If it is very cold: ? Avoid activities that take a lot of energy. ? Layer your clothes. ? Wear mittens or gloves, a hat, and a face covering when you go outside. ? Avoid alcohol. Follow these instructions at home:  Stay up to date with shots (vaccines). Get pneumococcal and flu (influenza) shots.  Keep all follow-up visits. Contact a doctor if:  You gain 2-3 lb (1-1.4 kg) in 24 hours or 5 lb (2.3 kg) in a week.  You have increasing shortness of breath.  You cannot do your normal activities.  You get tired easily.  You cough a lot.  You do not feel like eating or feel like you may vomit (nauseous).  You have swelling in your hands, feet, ankles, or belly (abdomen).  You cannot sleep well because it is hard to breathe.  You feel like your heart is beating fast (palpitations).  You get dizzy when you stand up.  You feel depressed or sad. Get help right away if:  You have trouble breathing.  You or someone else notices a change in your behavior, such as having trouble staying awake.  You have chest pain or discomfort.  You pass out (faint). These symptoms may be an emergency. Get help right away. Call your local emergency services (911 in the [place]  Do not wait to see if the symptoms will go away.  Do not drive yourself to the hospital. Summary  Heart failure is a serious condition. To care for yourself, you may have to change your diet, take medicines, and make other lifestyle changes.  Take your medicines every day. Do not stop taking them unless your doctor tells you to do so.  Limit salt and eat heart-healthy foods.  Ask your doctor if you can drink alcohol. You may have to stop alcohol use if you have very bad heart failure.  Contact your doctor if you gain weight quickly or feel  that your heart is beating too fast. Get help right away if you pass out or have chest pain or trouble breathing. This information is not intended to replace advice given to you by your health care provider. Make sure you discuss any questions you have with your health care provider. Document Revised: 02/24/2020 Document Reviewed: 02/24/2020 Elsevier Patient Education  2021 [employer]vier Inc.  

## 2020-12-19 NOTE — Assessment & Plan Note (Signed)
Controlled.  Received new CPAP yesterday. He is getting used to the new machine but states he slept well last night.  Return to clinic in 3 months or sooner if concerns arise.

## 2020-12-19 NOTE — Assessment & Plan Note (Signed)
Controlled.  Followed by [facility] HF clinic.  Continue weighing daily and call for an overnight weight gain of > 2 pounds or a weekly weight gain of >5 pounds.  Follow up in 3 months.  Labs ordered today.

## 2020-12-19 NOTE — Assessment & Plan Note (Signed)
Chronic.  Weight loss surgery discussed during visit.  Recommend patient find out from Cardiology if they would clear him for surgery. If they agree, will refer patient to weight loss clinic.

## 2020-12-19 NOTE — Assessment & Plan Note (Signed)
Chronic.  Controlled.  Managed by HF clinic.  Return to clinic if concerns arise.  We will recheck in 3 months.

## 2020-12-19 NOTE — Progress Notes (Signed)
BP 105/75   Pulse 72   Temp 98.4 F (36.9 C) (Oral)   Ht $R'5\' 8"'IS$  (1.727 m)   Wt (!) 346 lb 9.6 oz (157.2 kg)   SpO2 97%   BMI 52.70 kg/m    Subjective:    Patient ID: Marc [name], male    DOB: 09/18/1975, 45 y.o.   MRN: [id number]  HPI: Marc [name] is a 45 y.o. male  Chief Complaint  Patient presents with  . Establish Care    Patient states his old CPAP machine use to blow into his L eye and caused some dry eye and was seeing a previous provider who was supposed to treat it and states he is unhappy with the prior provider. Patient states he was given a prescription eye drop and it helped. Patient states he has tried OTC eye drops doesn't help at all. Patient states he has been dealing with it for about 4-5 months.    Patient presents to clinic to establish care with new PCP.  Patient has a history of CHF, gout, and OSA.  He follows up with HF clinic.  Echo report from 10/10/2018 reviewed and showed an EF of 20-25% along with moderate MR/TR.  Patient states he does have HTN but it is controlled.   Denies HA, CP, dizziness, palpitations, visual changes, and lower extremity swelling.  Does have some SOB due to HF.    SLEEP APNEA Sleep apnea status: controlled Duration: chronic Satisfied with current treatment?:  yes CPAP use:  yes Sleep quality with CPAP use: excellent Treament compliance:excellent compliance Last sleep study:  Treatments attempted:  Wakes feeling refreshed:  yes Daytime hypersomnolence:  no Fatigue:  yes Insomnia:  no Good sleep hygiene:  no Difficulty falling asleep:  no Difficulty staying asleep:  yes Snoring bothers bed partner:  no Observed apnea by bed partner: no Obesity:  yes Hypertension: yes  Pulmonary hypertension:  yes Coronary artery disease:  no  GOUT States he has not taken the Allopurinol.  He has not been able to get the medication. He does endorse pain in both feet, especially at night.  Denies current gout  exacerbation.  Relevant past medical, surgical, family and social history reviewed and updated as indicated. Interim medical history since our last visit reviewed. Allergies and medications reviewed and updated.  Review of Systems  Eyes: Negative for visual disturbance.  Respiratory: Negative for shortness of breath.   Cardiovascular: Negative for chest pain and leg swelling.  Musculoskeletal:       Pain in both legs  Neurological: Negative for light-headedness and headaches.  Psychiatric/Behavioral: Positive for sleep disturbance.    Per HPI unless specifically indicated above     Objective:    BP 105/75   Pulse 72   Temp 98.4 F (36.9 C) (Oral)   Ht $R'5\' 8"'qY$  (1.727 m)   Wt (!) 346 lb 9.6 oz (157.2 kg)   SpO2 97%   BMI 52.70 kg/m   Wt Readings from Last 3 Encounters:  12/19/20 (!) 346 lb 9.6 oz (157.2 kg)  12/09/20 (!) 341 lb 6 oz (154.8 kg)  11/18/20 (!) 348 lb (157.9 kg)    Physical Exam Vitals and nursing note reviewed.  Constitutional:      General: He is not in acute distress.    Appearance: Normal appearance. He is obese. He is not ill-appearing, toxic-appearing or diaphoretic.  HENT:     Head: Normocephalic.     Right Ear: External ear normal.  Left Ear: External ear normal.     Nose: Nose normal. No congestion or rhinorrhea.     Mouth/Throat:     Mouth: Mucous membranes are moist.  Eyes:     General:        Right eye: No discharge.        Left eye: No discharge.     Extraocular Movements: Extraocular movements intact.     Conjunctiva/sclera: Conjunctivae normal.     Pupils: Pupils are equal, round, and reactive to light.  Cardiovascular:     Rate and Rhythm: Normal rate and regular rhythm.     Heart sounds: No murmur heard.   Pulmonary:     Effort: Pulmonary effort is normal. No respiratory distress.     Breath sounds: Normal breath sounds. No wheezing, rhonchi or rales.  Abdominal:     General: Abdomen is flat. Bowel sounds are normal.   Musculoskeletal:     Cervical back: Normal range of motion and neck supple.     Right lower leg: No edema.     Left lower leg: No edema.  Skin:    General: Skin is warm and dry.     Capillary Refill: Capillary refill takes less than 2 seconds.  Neurological:     General: No focal deficit present.     Mental Status: He is alert and oriented to person, place, and time.  Psychiatric:        Mood and Affect: Mood normal.        Behavior: Behavior normal.        Thought Content: Thought content normal.        Judgment: Judgment normal.        Assessment & Plan:   Problem List Items Addressed This Visit      Cardiovascular and Mediastinum   Congestive heart failure ([place]) - Primary    Controlled.  Followed by [facility] HF clinic.  Continue weighing daily and call for an overnight weight gain of > 2 pounds or a weekly weight gain of >5 pounds.  Follow up in 3 months.  Labs ordered today.       Relevant Orders   Comp Met (CMET)   Hypertension    Chronic.  Controlled.  Managed by HF clinic.  Return to clinic if concerns arise.  We will recheck in 3 months.          Respiratory   OSA on CPAP    Controlled.  Received new CPAP yesterday. He is getting used to the new machine but states he slept well last night.  Return to clinic in 3 months or sooner if concerns arise.       Relevant Orders   Comp Met (CMET)     Other   Morbid obesity ([place])    Chronic.  Weight loss surgery discussed during visit.  Recommend patient find out from Cardiology if they would clear him for surgery. If they agree, will refer patient to weight loss clinic.       Relevant Orders   Comp Met (CMET)   BMI 50.0-59.9, adult (HCC)    Chronic.  Weight loss surgery discussed during visit.  Recommend patient find out from Cardiology if they would clear him for surgery. If they agree, will refer patient to weight loss clinic.       Relevant Orders   Comp Met (CMET)   Tobacco user    Chronic.  Current everyday  smoker.  Not ready to quit. Return to clinic  in 3 months.  Labs ordered today.       Relevant Orders   Comp Met (CMET)   CBC w/Diff   Gout    Chronic.  Begin allopurinol daily.  Side effects and benefits discussed during visit.  Follow up in 3 months.       Other Visit Diagnoses    Encounter to establish care           Follow up plan: Return in about 3 months (around 03/21/2021) for Physical and Fasting labs.   A total of 40 minutes were spent on this encounter today.  When total time is documented, this includes both the face-to-face and non-face-to-face time personally spent before, during and after the visit on the date of the encounter.

## 2020-12-19 NOTE — Assessment & Plan Note (Signed)
Chronic.  Current everyday smoker.  Not ready to quit. Return to clinic in 3 months.  Labs ordered today.

## 2020-12-20 LAB — CBC WITH DIFFERENTIAL/PLATELET
Basophils Absolute: 0.1 10*3/uL (ref 0.0–0.2)
Basos: 1 %
EOS (ABSOLUTE): 0.2 10*3/uL (ref 0.0–0.4)
Eos: 2 %
Hematocrit: 44.2 % (ref 37.5–51.0)
Hemoglobin: 13.2 g/dL (ref 13.0–17.7)
Immature Grans (Abs): 0 10*3/uL (ref 0.0–0.1)
Immature Granulocytes: 0 %
Lymphocytes Absolute: 2.7 10*3/uL (ref 0.7–3.1)
Lymphs: 29 %
MCH: 22.8 pg — ABNORMAL LOW (ref 26.6–33.0)
MCHC: 29.9 g/dL — ABNORMAL LOW (ref 31.5–35.7)
MCV: 77 fL — ABNORMAL LOW (ref 79–97)
Monocytes Absolute: 0.8 10*3/uL (ref 0.1–0.9)
Monocytes: 8 %
Neutrophils Absolute: 5.6 10*3/uL (ref 1.4–7.0)
Neutrophils: 60 %
Platelets: 283 10*3/uL (ref 150–450)
RBC: 5.78 x10E6/uL (ref 4.14–5.80)
RDW: 17.4 % — ABNORMAL HIGH (ref 11.6–15.4)
WBC: 9.4 10*3/uL (ref 3.4–10.8)

## 2020-12-20 LAB — COMPREHENSIVE METABOLIC PANEL
ALT: 10 IU/L (ref 0–44)
AST: 15 IU/L (ref 0–40)
Albumin/Globulin Ratio: 1.1 — ABNORMAL LOW (ref 1.2–2.2)
Albumin: 4.2 g/dL (ref 4.0–5.0)
Alkaline Phosphatase: 123 IU/L — ABNORMAL HIGH (ref 44–121)
BUN/Creatinine Ratio: 14 (ref 9–20)
BUN: 12 mg/dL (ref 6–24)
Bilirubin Total: <0.2 mg/dL (ref 0.0–1.2)
CO2: 28 mmol/L (ref 20–29)
Calcium: 9.1 mg/dL (ref 8.7–10.2)
Chloride: 95 mmol/L — ABNORMAL LOW (ref 96–106)
Creatinine, Ser: 0.87 mg/dL (ref 0.76–1.27)
Globulin, Total: 3.8 g/dL (ref 1.5–4.5)
Glucose: 68 mg/dL (ref 65–99)
Potassium: 3.8 mmol/L (ref 3.5–5.2)
Sodium: 140 mmol/L (ref 134–144)
Total Protein: 8 g/dL (ref 6.0–8.5)
eGFR: 108 mL/min/{1.73_m2} (ref >59)

## 2020-12-23 NOTE — Progress Notes (Signed)
Hi Mr. Que.  Overall your lab work looks good. Liver enzymes are slightly elevated but we will monitor this at future visits. Your blood count is still abnormal but improving.  You aren't showing any anemia at this point, the size of your cells are abnormal.  We will continue to monitor this also.  Please let me know if you have any questions.

## 2020-12-31 ENCOUNTER — Other Ambulatory Visit: Payer: Self-pay | Admitting: Family

## 2020-12-31 MED ORDER — SPIRONOLACTONE 25 MG PO TABS: 25.0000 mg | ORAL_TABLET | Freq: Every day | ORAL | 3 refills | Status: DC

## 2020-12-31 NOTE — Progress Notes (Signed)
Spironolactone sent to [facility]

## 2021-01-04 NOTE — Progress Notes (Deleted)
Patient ID: ULAS ZUERCHER, male    DOB: 1975-08-18, 45 y.o.   MRN: [id number]   [name] is a 45 y/o male with a history of HTN, chronic pain, bipolar, current tobacco use and chronic heart failure.   Echo report from 10/10/2018 reviewed and showed an EF of 20-25% along with moderate MR/TR.   RHC/LHC done 09/19/20 and showed:  Severely depressed overall left ventricular function EF less than 25%  Severely enlarged left ventricular chamber  Left main large free of disease  LAD was large and free of disease  Circumflex was large and free of disease left dominant  Right heart cath showed no evidence of pulmonary hypertension  Mean PA was 23 mean wedge of 10  Cardiac output of 7.6 Fick  Was in the ED 05/20/20 due to acute gout of left ankle where he was evaluated and released.   He presents today for a follow-up visit with a chief complaint of   Past Medical History:  Diagnosis Date  . Bipolar affective ([place])   . CHF (congestive heart failure) ([place])   . Hypertension    Past Surgical History:  Procedure Laterality Date  . BICEPS TENDON REPAIR    . COLONOSCOPY WITH PROPOFOL N/A 10/09/2020   Procedure: COLONOSCOPY WITH PROPOFOL;  Surgeon: Lin Landsman, MD;  Location: [facility] ENDOSCOPY;  Service: Gastroenterology;  Laterality: N/A;  . FLEXIBLE SIGMOIDOSCOPY N/A 10/30/2020   Procedure: FLEXIBLE SIGMOIDOSCOPY;  Surgeon: Lin Landsman, MD;  Location: [facility] ENDOSCOPY;  Service: Gastroenterology;  Laterality: N/A;  . FOOT SURGERY     metataral and fasciotomy  . RIGHT/LEFT HEART CATH AND CORONARY ANGIOGRAPHY Bilateral 09/19/2020   Procedure: RIGHT/LEFT HEART CATH AND CORONARY ANGIOGRAPHY;  Surgeon: Yolonda Kida, MD;  Location: [place] CV LAB;  Service: Cardiovascular;  Laterality: Bilateral;  . ROTATOR CUFF REPAIR     Family History  Problem Relation Age of Onset  . Stroke Mother   . Dementia Mother   . Hypertension Father   . Diabetes Father    Social  History   Tobacco Use  . Smoking status: Current Every Day Smoker    Packs/day: 0.50    Years: 25.00    Pack years: 12.50    Types: Cigarettes  . Smokeless tobacco: Never Used  Substance Use Topics  . Alcohol use: Not Currently   Allergies  Allergen Reactions  . Geodon [Ziprasidone] Other (See Comments)    paralysis  . Tramadol Other (See Comments)    Pt stated that it gave him sores.  . Amoxicillin Rash  . Zithromax [Azithromycin] Rash    Review of Systems  Constitutional: Positive for appetite change (decreased) and fatigue (tired "all the time").  HENT: Positive for congestion. Negative for postnasal drip and sore throat.   Eyes: Negative.   Respiratory: Positive for shortness of breath. Negative for cough.   Cardiovascular: Positive for chest pain (intermittent) and leg swelling. Negative for palpitations.  Gastrointestinal: Negative for abdominal distention and abdominal pain.  Endocrine: Negative.   Genitourinary: Negative.   Musculoskeletal: Positive for arthralgias (legs), back pain and neck pain.  Skin: Negative.   Allergic/Immunologic: Negative.   Neurological: Positive for headaches. Negative for dizziness and light-headedness.  Hematological: Negative for adenopathy. Does not bruise/bleed easily.  Psychiatric/Behavioral: Positive for dysphoric mood. Negative for sleep disturbance (wearing CPAP). The patient is nervous/anxious.      Physical Exam Vitals and nursing note reviewed.  HENT:     Head: Normocephalic and atraumatic.  Cardiovascular:  Rate and Rhythm: Normal rate and regular rhythm.  Pulmonary:     Effort: Pulmonary effort is normal.     Breath sounds: No wheezing or rales.     Comments: Diminished due to chest wall size Abdominal:     General: There is no distension.     Palpations: Abdomen is soft.     Tenderness: There is no abdominal tenderness.  Musculoskeletal:        General: No tenderness.     Cervical back: Normal range of motion  and neck supple.     Right lower leg: No tenderness. No edema.     Left lower leg: No tenderness. No edema.  Skin:    General: Skin is warm and dry.  Neurological:     General: No focal deficit present.     Mental Status: He is alert and oriented to person, place, and time.  Psychiatric:        Attention and Perception: Attention normal.        Mood and Affect: Mood is anxious. Mood is not depressed.        Behavior: Behavior normal.     Assessment & Plan:  1. Chronic heart failure with reduced ejection fraction- - NYHA class III - euvolemic today - weighing daily; reminded to call for an overnight weight gain of >2 pounds or a weekly weight gain of >5 pounds - weight 341.6 pounds from last visit here 1 month ago - admits to not being very active because of his sciatica pain - not adding salt to his food and has been reading food labels - saw cardiology Marc [name]) 10/01/20 - on GDMT of carvedilol, entresto & spironolactone - discussed adding SGLT but he would like to wait and get all his meds switched to [facility] - admits to forgetting his PM meds "often" and doesn't want to take his diuretic at supper time; instructed that he could take his torsemide around 2pm (takes AM meds ~ 6am) and then take his other evening meds at suppertime; also discussed using an alarm to help remind him - BNP 10/19/19 was 101.0 - participating in paramedicine program - PharmD reconciled medications w/ the patient - discuss going to [facility]  2: HTN- - BP  - saw PCP Marc [name])  - CMP from 12/19/20 reviewed and showed sodium 140, potassium 3.8, creatinine 0.87 and GFR 108  3: Anxiety/ depression- - patient says that he's talked to the therapist at [place] has been approved and he now has his insurance in place - working on getting his meds switched to mail order [facility]  4: Tobacco use- - smoking ~ 1/2 ppd of cigarettes - denies alcohol or drug use - not interested in stopping completely at  this time - complete cessation discussed for 3 minutes with the patient.   5: Obstructive sleep apnea- - saw pulmonology Marc [name]) 11/18/20 - getting new CPAP equipment on 12/18/20   Patient did not bring his medications nor a list. Each medication was verbally reviewed with the patient and he was encouraged to bring the bottles to every visit to confirm accuracy of list.

## 2021-01-06 ENCOUNTER — Ambulatory Visit: Payer: Medicare Other | Admitting: Family

## 2021-01-06 ENCOUNTER — Telehealth: Payer: Self-pay | Admitting: Family

## 2021-01-06 ENCOUNTER — Ambulatory Visit: Payer: Self-pay | Admitting: Family

## 2021-01-06 NOTE — Telephone Encounter (Signed)
Patient did not show for his Heart Failure Clinic appointment on 01/06/21. Will attempt to reschedule.

## 2021-01-18 DIAGNOSIS — G4733 Obstructive sleep apnea (adult) (pediatric): Secondary | ICD-10-CM | POA: Diagnosis not present

## 2021-01-23 ENCOUNTER — Ambulatory Visit: Payer: Medicare Other | Attending: Family | Admitting: Family

## 2021-01-23 ENCOUNTER — Encounter: Payer: Self-pay | Admitting: Pharmacist

## 2021-01-23 ENCOUNTER — Other Ambulatory Visit: Payer: Self-pay

## 2021-01-23 ENCOUNTER — Encounter: Payer: Self-pay | Admitting: Family

## 2021-01-23 VITALS — BP 137/87 | HR 84 | Resp 18 | Ht 69.0 in | Wt 349.0 lb

## 2021-01-23 DIAGNOSIS — Z880 Allergy status to penicillin: Secondary | ICD-10-CM | POA: Insufficient documentation

## 2021-01-23 DIAGNOSIS — F319 Bipolar disorder, unspecified: Secondary | ICD-10-CM | POA: Diagnosis not present

## 2021-01-23 DIAGNOSIS — Z881 Allergy status to other antibiotic agents status: Secondary | ICD-10-CM | POA: Insufficient documentation

## 2021-01-23 DIAGNOSIS — Z8249 Family history of ischemic heart disease and other diseases of the circulatory system: Secondary | ICD-10-CM | POA: Insufficient documentation

## 2021-01-23 DIAGNOSIS — Z7982 Long term (current) use of aspirin: Secondary | ICD-10-CM | POA: Diagnosis not present

## 2021-01-23 DIAGNOSIS — G4733 Obstructive sleep apnea (adult) (pediatric): Secondary | ICD-10-CM | POA: Insufficient documentation

## 2021-01-23 DIAGNOSIS — Z888 Allergy status to other drugs, medicaments and biological substances status: Secondary | ICD-10-CM | POA: Insufficient documentation

## 2021-01-23 DIAGNOSIS — Z885 Allergy status to narcotic agent status: Secondary | ICD-10-CM | POA: Insufficient documentation

## 2021-01-23 DIAGNOSIS — I5022 Chronic systolic (congestive) heart failure: Secondary | ICD-10-CM | POA: Diagnosis not present

## 2021-01-23 DIAGNOSIS — Z79899 Other long term (current) drug therapy: Secondary | ICD-10-CM | POA: Insufficient documentation

## 2021-01-23 DIAGNOSIS — F419 Anxiety disorder, unspecified: Secondary | ICD-10-CM

## 2021-01-23 DIAGNOSIS — Z9989 Dependence on other enabling machines and devices: Secondary | ICD-10-CM | POA: Diagnosis not present

## 2021-01-23 DIAGNOSIS — I110 Hypertensive heart disease with heart failure: Secondary | ICD-10-CM | POA: Insufficient documentation

## 2021-01-23 DIAGNOSIS — F32A Depression, unspecified: Secondary | ICD-10-CM

## 2021-01-23 DIAGNOSIS — G8929 Other chronic pain: Secondary | ICD-10-CM | POA: Diagnosis not present

## 2021-01-23 DIAGNOSIS — F17210 Nicotine dependence, cigarettes, uncomplicated: Secondary | ICD-10-CM | POA: Insufficient documentation

## 2021-01-23 DIAGNOSIS — Z720 Tobacco use: Secondary | ICD-10-CM

## 2021-01-23 DIAGNOSIS — I10 Essential (primary) hypertension: Secondary | ICD-10-CM

## 2021-01-23 MED ORDER — DAPAGLIFLOZIN PROPANEDIOL 10 MG PO TABS: 10.0000 mg | ORAL_TABLET | Freq: Every day | ORAL | 3 refills | Status: DC

## 2021-01-23 MED ORDER — DAPAGLIFLOZIN PROPANEDIOL 10 MG PO TABS: 10.0000 mg | ORAL_TABLET | Freq: Every day | ORAL | 0 refills | Status: DC

## 2021-01-23 NOTE — Progress Notes (Signed)
[place] - PHARMACIST COUNSELING NOTE  Guideline-Directed Medical Therapy/Evidence Based Medicine  ACE/ARB/ARNI: Sacubitril-valsartan 97-103 mg twice daily Beta Blocker: Carvedilol 6.25 mg twice daily Aldosterone Antagonist: Spironolactone 25 mg daily Diuretic: Torsemide 40 mg twice daily SGLT2i:  N/A  Adherence Assessment  Do you ever forget to take your medication? [] Yes [x] No  Do you ever skip doses due to side effects? [] Yes [x] No  Do you have trouble affording your medicines? [] Yes [x] No  Are you ever unable to pick up your medication due to transportation difficulties? [] Yes [x] No  Do you ever stop taking your medications because you don't believe they are helping? [] Yes [x] No  Do you check your weight daily? [x] Yes [] No   Adherence strategy: Pt endorses adherence as apart of his daily routine  Barriers to obtaining medications: N/A  Vital signs: HR 84, BP 137/83, weight (pounds) 349 ECHO: Date 10/10/2018, EF 20-25% Cath: Date 09/19/2020, EF <25%, notes: Severely depressed overall left ventricular function EF less than 25% Severely enlarged left ventricular chamber Left main large free of disease LAD was large and free of disease Circumflex was large and free of disease left dominant Right heart cath showed no evidence of pulmonary hypertension Mean PA was 23 mean wedge of 10 Cardiac output of 7.6 Fick   BMP Latest Ref Rng & Units 12/19/2020 01/08/2020 11/10/2019  Glucose 65 - 99 mg/dL 68 99 100(H)  BUN 6 - 24 mg/dL 12 12 10   Creatinine 0.76 - 1.27 mg/dL 0.87 0.86 0.87  BUN/Creat Ratio 9 - 20 14 - -  Sodium 134 - 144 mmol/L 140 139 140  Potassium 3.5 - 5.2 mmol/L 3.8 4.4 4.1  Chloride 96 - 106 mmol/L 95(L) 107 102  CO2 20 - 29 mmol/L 28 24 28   Calcium 8.7 - 10.2 mg/dL 9.1 9.3 9.1    Past Medical History:  Diagnosis Date   Bipolar affective ([place])    CHF (congestive heart failure) ([place])    Hypertension      ASSESSMENT 45 year old male who presents to the HF clinic for a follow up visit. Pt previously wanted to wait on an SGLT2i until all of his medications were switched over to [facility] - the only two prescriptions not with [facility] are his cyclobenazprine and lidocaine patch because these did not have any refills. Pt states that his fluid pill makes him urinate a lot but he does not miss doses. Pt BP was 137/83 and he reports he took all of his medications this morning except for his spironolactone and torsemide which he states he will take after the appointment. Pt states that he has dizzy spells but admits he has had these since being diagnosed with HF. Pt does not otherwise have any complaints at this time.   Recent ED Visit (past 6 months): Date - 09/19/2020, [facility] - chest pain  PLAN CHF/HTN -continue Entresto 97-103 mg twice daily, carvedilol 6.25 mg twice daily, torsemide 40 mg twice daily, and spironolactone 25 mg daily -discussed with provider, pt to start dapagliflozin 10 mg daily; 30 day voucher provided to patient today -consider titration of BB at a later visit -continue daily weight checks   Gout -continue allopurinol 100 mg daily   Time spent: 10 minutes  Sherilyn Banker, PharmD Pharmacy Resident  01/23/2021 9:10 AM     Current Outpatient Medications:    albuterol (VENTOLIN HFA) 108 (90 Base) MCG/ACT inhaler, Inhale 2 puffs into the lungs every 6 (six) hours as needed  for wheezing or shortness of breath., Disp: 18 g, Rfl: 1   allopurinol (ZYLOPRIM) 100 MG tablet, Take 1 tablet (100 mg total) by mouth daily., Disp: 90 tablet, Rfl: 1   aspirin EC 81 MG tablet, Take 81 mg by mouth daily., Disp: , Rfl:    carvedilol (COREG) 6.25 MG tablet, Take 1 tablet (6.25 mg total) by mouth 2 (two) times daily., Disp: 180 tablet, Rfl: 3   cyclobenzaprine (FLEXERIL) 5 MG tablet, Take 10 mg by mouth 3 (three) times daily as needed for muscle spasms. (Patient not taking: Reported on 01/23/2021),  Disp: , Rfl:    lidocaine (LIDODERM) 5 %, Place 1 patch onto the skin daily as needed (pain). Remove & Discard patch within 12 hours or as directed by MD (Patient not taking: Reported on 01/23/2021), Disp: , Rfl:    sacubitril-valsartan (ENTRESTO) 97-103 MG, Take 1 tablet by mouth 2 (two) times daily., Disp: 180 tablet, Rfl: 3   spironolactone (ALDACTONE) 25 MG tablet, Take 1 tablet (25 mg total) by mouth daily., Disp: 90 tablet, Rfl: 3   torsemide (DEMADEX) 20 MG tablet, Take 40 mg by mouth 2 (two) times daily., Disp: , Rfl:    trimethoprim-polymyxin b (POLYTRIM) ophthalmic solution, Place 1 drop into the left eye every 6 (six) hours., Disp: 10 mL, Rfl: 0   vitamin C (ASCORBIC ACID) 500 MG tablet, Take 500 mg by mouth daily., Disp: , Rfl:    COUNSELING POINTS/CLINICAL PEARLS Dapagliflozin (Goal: 10 mg daily)  Inform patients that the most common adverse reactions are genital mycotic infections, nasopharyngitis (dapagliflozin), and urinary tract infections. Inform patients that hypotension may occur and advise them to contact their healthcare provider if they experience such symptoms. Inform patients that dehydration may increase the risk for hypotension, and to have adequate fluid intake.    DRUGS TO CAUTION IN HEART FAILURE  Drug or Class Mechanism  Analgesics NSAIDs COX-2 inhibitors Glucocorticoids  Sodium and water retention, increased systemic vascular resistance, decreased response to diuretics   Diabetes Medications Metformin Thiazolidinediones Rosiglitazone (Avandia) Pioglitazone (Actos) DPP4 Inhibitors Saxagliptin (Onglyza) Sitagliptin (Januvia)   Lactic acidosis Possible calcium channel blockade   Unknown  Antiarrhythmics Class I  Flecainide Disopyramide Class III Sotalol Other Dronedarone  Negative inotrope, proarrhythmic   Proarrhythmic, beta blockade  Negative inotrope  Antihypertensives Alpha Blockers Doxazosin Calcium Channel  Blockers Diltiazem Verapamil Nifedipine Central Alpha Adrenergics Moxonidine Peripheral Vasodilators Minoxidil  Increases renin and aldosterone  Negative inotrope    Possible sympathetic withdrawal  Unknown  Anti-infective Itraconazole Amphotericin B  Negative inotrope Unknown  Hematologic Anagrelide Cilostazol   Possible inhibition of PD IV Inhibition of PD III causing arrhythmias  Neurologic/Psychiatric Stimulants Anti-Seizure Drugs Carbamazepine Pregabalin Antidepressants Tricyclics Citalopram Parkinsons Bromocriptine Pergolide Pramipexole Antipsychotics Clozapine Antimigraine Ergotamine Methysergide Appetite suppressants Bipolar Lithium  Peripheral alpha and beta agonist activity  Negative inotrope and chronotrope Calcium channel blockade  Negative inotrope, proarrhythmic Dose-dependent QT prolongation  Excessive serotonin activity/valvular damage Excessive serotonin activity/valvular damage Unknown  IgE mediated hypersensitivy, calcium channel blockade  Excessive serotonin activity/valvular damage Excessive serotonin activity/valvular damage Valvular damage  Direct myofibrillar degeneration, adrenergic stimulation  Antimalarials Chloroquine Hydroxychloroquine Intracellular inhibition of lysosomal enzymes  Urologic Agents Alpha Blockers Doxazosin Prazosin Tamsulosin Terazosin  Increased renin and aldosterone  Adapted from Page Carleene Overlie, et al. "Drugs That May Cause or Exacerbate Heart Failure: A Scientific Statement from the American Heart  Association." Circulation 2016; 134:e32-e69. DOI: 10.1161/CIR.0000000000000426   MEDICATION ADHERENCES TIPS AND STRATEGIES Taking medication as prescribed improves patient outcomes  in heart failure (reduces hospitalizations, improves symptoms, increases survival) Side effects of medications can be managed by decreasing doses, switching agents, stopping drugs, or adding additional therapy. Please let  someone in the [facility] know if you have having bothersome side effects so we can modify your regimen. Do not alter your medication regimen without talking to [place].  Medication reminders can help patients remember to take drugs on time. If you are missing or forgetting doses you can try linking behaviors, using pill boxes, or an electronic reminder like an alarm on your phone or an app. Some people can also get automated phone calls as medication reminders.

## 2021-01-23 NOTE — Progress Notes (Signed)
Patient ID: Marc [name], male    DOB: [birth year], 45 y.o.   MRN: [id number]   Mr Aber is a 45 y/o male with a history of HTN, chronic pain, bipolar, current tobacco use and chronic heart failure.   Echo report from 10/10/2018 reviewed and showed an EF of 20-25% along with moderate MR/TR.   RHC/LHC done 09/19/20 and showed: Severely depressed overall left ventricular function EF less than 25% Severely enlarged left ventricular chamber Left main large free of disease LAD was large and free of disease Circumflex was large and free of disease left dominant Right heart cath showed no evidence of pulmonary hypertension Mean PA was 23 mean wedge of 10 Cardiac output of 7.6 Fick  Was in the ED 05/20/20 due to acute gout of left ankle where he was evaluated and released.   He presents today for a follow-up visit with a chief complaint of moderate fatigue upon minimal exertion. He describes this as chronic in nature having been present for several years. He has associated decreased appetite, shortness of breath, headaches, pedal edema, depression, anxiety, chronic pain and gradual weight gain along with this. He denies any difficulty sleeping, dizziness, cough, chest pain, palpitations or abdominal distention.   Past Medical History:  Diagnosis Date   Bipolar affective ([place])    CHF (congestive heart failure) ([place])    Hypertension    Past Surgical History:  Procedure Laterality Date   BICEPS TENDON REPAIR     COLONOSCOPY WITH PROPOFOL N/A 10/09/2020   Procedure: COLONOSCOPY WITH PROPOFOL;  Surgeon: Lin Landsman, MD;  Location: [facility] ENDOSCOPY;  Service: Gastroenterology;  Laterality: N/A;   FLEXIBLE SIGMOIDOSCOPY N/A 10/30/2020   Procedure: FLEXIBLE SIGMOIDOSCOPY;  Surgeon: Lin Landsman, MD;  Location: [facility] ENDOSCOPY;  Service: Gastroenterology;  Laterality: N/A;   FOOT SURGERY     metataral and fasciotomy   RIGHT/LEFT HEART CATH AND CORONARY ANGIOGRAPHY Bilateral 09/19/2020    Procedure: RIGHT/LEFT HEART CATH AND CORONARY ANGIOGRAPHY;  Surgeon: Yolonda Kida, MD;  Location: [place] CV LAB;  Service: Cardiovascular;  Laterality: Bilateral;   ROTATOR CUFF REPAIR     Family History  Problem Relation Age of Onset   Stroke Mother    Dementia Mother    Hypertension Father    Diabetes Father    Social History   Tobacco Use   Smoking status: Every Day    Packs/day: 0.50    Years: 25.00    Pack years: 12.50    Types: Cigarettes   Smokeless tobacco: Never  Substance Use Topics   Alcohol use: Not Currently   Allergies  Allergen Reactions   Geodon [Ziprasidone] Other (See Comments)    paralysis   Tramadol Other (See Comments)    Pt stated that it gave him sores.   Amoxicillin Rash   Zithromax [Azithromycin] Rash   Prior to Admission medications   Medication Sig Start Date End Date Taking? Authorizing Provider  albuterol (VENTOLIN HFA) 108 (90 Base) MCG/ACT inhaler Inhale 2 puffs into the lungs every 6 (six) hours as needed for wheezing or shortness of breath. 12/19/20  Yes Jon Billings, NP  allopurinol (ZYLOPRIM) 100 MG tablet Take 1 tablet (100 mg total) by mouth daily. 12/19/20  Yes Jon Billings, NP  aspirin EC 81 MG tablet Take 81 mg by mouth daily.   Yes [provider]  carvedilol (COREG) 6.25 MG tablet Take 1 tablet (6.25 mg total) by mouth 2 (two) times daily. 08/12/20  Yes Alisa Graff,  FNP  sacubitril-valsartan (ENTRESTO) 97-103 MG Take 1 tablet by mouth 2 (two) times daily. 11/10/19  Yes Darylene Price A, FNP  spironolactone (ALDACTONE) 25 MG tablet Take 1 tablet (25 mg total) by mouth daily. 12/31/20  Yes Havannah Streat, Otila Kluver A, FNP  torsemide (DEMADEX) 20 MG tablet Take 40 mg by mouth 2 (two) times daily.   Yes [provider]  trimethoprim-polymyxin b (POLYTRIM) ophthalmic solution Place 1 drop into the left eye every 6 (six) hours. 12/19/20  Yes Jon Billings, NP  vitamin C (ASCORBIC ACID) 500 MG tablet Take 500  mg by mouth daily.   Yes [provider]  cyclobenzaprine (FLEXERIL) 5 MG tablet Take 10 mg by mouth 3 (three) times daily as needed for muscle spasms. Patient not taking: Reported on 01/23/2021    [provider]  lidocaine (LIDODERM) 5 % Place 1 patch onto the skin daily as needed (pain). Remove & Discard patch within 12 hours or as directed by MD Patient not taking: Reported on 01/23/2021    [provider]    Review of Systems  Constitutional:  Positive for appetite change (decreased) and fatigue (tired "all the time").  HENT:  Negative for congestion, postnasal drip and sore throat.   Eyes: Negative.   Respiratory:  Positive for shortness of breath (after sitting outside). Negative for cough.   Cardiovascular:  Positive for leg swelling. Negative for chest pain and palpitations.  Gastrointestinal:  Negative for abdominal distention and abdominal pain.  Endocrine: Negative.   Genitourinary: Negative.   Musculoskeletal:  Positive for arthralgias (legs), back pain and neck pain.  Skin: Negative.   Allergic/Immunologic: Negative.   Neurological:  Positive for headaches. Negative for dizziness and light-headedness.  Hematological:  Negative for adenopathy. Does not bruise/bleed easily.  Psychiatric/Behavioral:  Positive for dysphoric mood. Negative for sleep disturbance (wearing CPAP). The patient is nervous/anxious.    Vitals:   01/23/21 0848  BP: 137/87  Pulse: 84  Resp: 18  SpO2: 98%  Weight: (!) 349 lb (158.3 kg)  Height: 5\' 9"  (1.753 m)   Wt Readings from Last 3 Encounters:  01/23/21 (!) 349 lb (158.3 kg)  12/19/20 (!) 346 lb 9.6 oz (157.2 kg)  12/09/20 (!) 341 lb 6 oz (154.8 kg)   Lab Results  Component Value Date   CREATININE 0.87 12/19/2020   CREATININE 0.86 01/08/2020   CREATININE 0.87 11/10/2019    Physical Exam Vitals and nursing note reviewed.  HENT:     Head: Normocephalic and atraumatic.  Cardiovascular:     Rate and Rhythm: Normal  rate and regular rhythm.  Pulmonary:     Effort: Pulmonary effort is normal.     Breath sounds: No wheezing or rales.     Comments: Diminished due to chest wall size Abdominal:     General: There is no distension.     Palpations: Abdomen is soft.     Tenderness: There is no abdominal tenderness.  Musculoskeletal:        General: No tenderness.     Cervical back: Normal range of motion and neck supple.     Right lower leg: No tenderness. No edema.     Left lower leg: No tenderness. No edema.  Skin:    General: Skin is warm and dry.  Neurological:     General: No focal deficit present.     Mental Status: He is alert and oriented to person, place, and time.  Psychiatric:        Attention and  Perception: Attention normal.        Mood and Affect: Mood is anxious. Mood is not depressed.        Behavior: Behavior normal.    Assessment & Plan:  1. Chronic heart failure with reduced ejection fraction- - NYHA class III - euvolemic today - weighing daily; reminded to call for an overnight weight gain of >2 pounds or a weekly weight gain of >5 pounds - weight up 8 pounds from last visit here 6 weeks ago - admits to not being very active because of his sciatica pain although he's trying to increase his activity and has started doing some gardening - not adding salt to his food and has been reading food labels - saw cardiology Dema Severin) 10/01/20 - on GDMT of carvedilol, entresto & spironolactone - will start farxiga 10mg  daily and 30 day voucher given to patient - will check BMP at next visit - BNP 10/19/19 was 101.0 - participating in paramedicine program - PharmD reconciled medications w/ the patient  2: HTN- - BP looks good today; he hasn't taken torsemide/ spironolactone yet today but will take upon his return home - saw PCP Mathis Dad) 12/19/20 - CMP from 12/19/20 reviewed and showed sodium 140, potassium 3.8, creatinine 0.87 and GFR 108  3: Anxiety/ depression- - patient says that  previously he's talked to the therapist at [name] - does feel like this is more under control  4: Tobacco use- - smoking ~ 1/2 ppd of cigarettes - denies alcohol or drug use - not interested in stopping completely at this time - complete cessation discussed for 3 minutes with the patient.   5: Obstructive sleep apnea- - saw pulmonology Humphrey Rolls) 11/18/20 - got new CPAP and has been wearing this every night   Medication bottles reviewed.   Return in 1 month or sooner for any questions/problems before then.

## 2021-01-23 NOTE — Patient Instructions (Addendum)
Continue weighing daily and call for an overnight weight gain of > 2 pounds or a weekly weight gain of >5 pounds.    Begin taking faraxiga as 1 tablet once a day

## 2021-01-31 NOTE — Progress Notes (Unsigned)
Pt did not show for scheduled appointment.  

## 2021-02-03 ENCOUNTER — Ambulatory Visit: Payer: Medicare Other

## 2021-02-17 DIAGNOSIS — G4733 Obstructive sleep apnea (adult) (pediatric): Secondary | ICD-10-CM | POA: Diagnosis not present

## 2021-02-18 NOTE — Progress Notes (Signed)
Patient ID: Marc [name], male    DOB: October 31, 1975, 45 y.o.   MRN: [id number]   Marc [name] is a 45 y/o male with a history of HTN, chronic pain, bipolar, current tobacco use and chronic heart failure.   Echo report from 10/10/2018 reviewed and showed an EF of 20-25% along with moderate Marc/TR.   RHC/LHC done 09/19/20 and showed: Severely depressed overall left ventricular function EF less than 25% Severely enlarged left ventricular chamber Left main large free of disease LAD was large and free of disease Circumflex was large and free of disease left dominant Right heart cath showed no evidence of pulmonary hypertension Mean PA was 23 mean wedge of 10 Cardiac output of 7.6 Fick  Was in the ED 05/20/20 due to acute gout of left ankle where he was evaluated and released.   He presents today for a follow-up visit with a chief complaint of moderate fatigue upon minimal exertion. He describes this as chronic in nature having been present for several years. He has associated cough, shortness of breath, intermittent chest pain, pedal edema, headaches, leg weakness and chronic pain along with this. He denies any difficulty sleeping, abdominal distention, palpitations, dizziness or weight gain.   Says that he gets anxious when he gets active because he isn't sure how much activity he should do or how far he should push himself. Is interested in exercising thought to build up his endurance.  Tolerating farxiga without known side effects.   Past Medical History:  Diagnosis Date   Bipolar affective ([place])    CHF (congestive heart failure) ([place])    Hypertension    Past Surgical History:  Procedure Laterality Date   BICEPS TENDON REPAIR     COLONOSCOPY WITH PROPOFOL N/A 10/09/2020   Procedure: COLONOSCOPY WITH PROPOFOL;  Surgeon: Lin Landsman, MD;  Location: [facility] ENDOSCOPY;  Service: Gastroenterology;  Laterality: N/A;   FLEXIBLE SIGMOIDOSCOPY N/A 10/30/2020   Procedure: FLEXIBLE  SIGMOIDOSCOPY;  Surgeon: Lin Landsman, MD;  Location: [facility] ENDOSCOPY;  Service: Gastroenterology;  Laterality: N/A;   FOOT SURGERY     metataral and fasciotomy   RIGHT/LEFT HEART CATH AND CORONARY ANGIOGRAPHY Bilateral 09/19/2020   Procedure: RIGHT/LEFT HEART CATH AND CORONARY ANGIOGRAPHY;  Surgeon: Yolonda Kida, MD;  Location: [place] CV LAB;  Service: Cardiovascular;  Laterality: Bilateral;   ROTATOR CUFF REPAIR     Family History  Problem Relation Age of Onset   Stroke Mother    Dementia Mother    Hypertension Father    Diabetes Father    Social History   Tobacco Use   Smoking status: Every Day    Packs/day: 0.50    Years: 25.00    Pack years: 12.50    Types: Cigarettes   Smokeless tobacco: Never  Substance Use Topics   Alcohol use: Not Currently   Allergies  Allergen Reactions   Geodon [Ziprasidone] Other (See Comments)    paralysis   Tramadol Other (See Comments)    Pt stated that it gave him sores.   Amoxicillin Rash   Zithromax [Azithromycin] Rash   Prior to Admission medications   Medication Sig Start Date End Date Taking? Authorizing Provider  albuterol (VENTOLIN HFA) 108 (90 Base) MCG/ACT inhaler Inhale 2 puffs into the lungs every 6 (six) hours as needed for wheezing or shortness of breath. 12/19/20  Yes Jon Billings, NP  allopurinol (ZYLOPRIM) 100 MG tablet Take 1 tablet (100 mg total) by mouth daily. 12/19/20  Yes Jon Billings,  NP  aspirin EC 81 MG tablet Take 81 mg by mouth daily.   Yes [provider]  carvedilol (COREG) 6.25 MG tablet Take 1 tablet (6.25 mg total) by mouth 2 (two) times daily. 08/12/20  Yes Alisa Graff, FNP  dapagliflozin propanediol (FARXIGA) 10 MG TABS tablet Take 1 tablet (10 mg total) by mouth daily before breakfast. 01/23/21  Yes Darylene Price A, FNP  dapagliflozin propanediol (FARXIGA) 10 MG TABS tablet Take 1 tablet (10 mg total) by mouth daily before breakfast. 01/23/21  Yes Marypat Kimmet A, FNP   lidocaine (LIDODERM) 5 % Place 1 patch onto the skin daily as needed (pain). Remove & Discard patch within 12 hours or as directed by MD   Yes [provider]  sacubitril-valsartan (ENTRESTO) 97-103 MG Take 1 tablet by mouth 2 (two) times daily. 11/10/19  Yes Darylene Price A, FNP  spironolactone (ALDACTONE) 25 MG tablet Take 1 tablet (25 mg total) by mouth daily. 12/31/20  Yes Cordae Mccarey, Otila Kluver A, FNP  torsemide (DEMADEX) 20 MG tablet Take 40 mg by mouth 2 (two) times daily.   Yes [provider]  trimethoprim-polymyxin b (POLYTRIM) ophthalmic solution Place 1 drop into the left eye every 6 (six) hours. 12/19/20  Yes Jon Billings, NP  vitamin C (ASCORBIC ACID) 500 MG tablet Take 500 mg by mouth daily.   Yes [provider]  cyclobenzaprine (FLEXERIL) 5 MG tablet Take 10 mg by mouth 3 (three) times daily as needed for muscle spasms. Patient not taking: No sig reported    [provider]    Review of Systems  Constitutional:  Positive for appetite change (decreased) and fatigue (tired "all the time").  HENT:  Negative for congestion, postnasal drip and sore throat.   Eyes: Negative.   Respiratory:  Positive for cough (at times) and shortness of breath (with moderate exertion).   Cardiovascular:  Positive for chest pain (a couple of days ago "little twinge") and leg swelling. Negative for palpitations.  Gastrointestinal:  Negative for abdominal distention and abdominal pain.  Endocrine: Negative.   Genitourinary: Negative.   Musculoskeletal:  Positive for arthralgias (legs), back pain and neck pain.  Skin:  Positive for wound (right shin).  Allergic/Immunologic: Negative.   Neurological:  Positive for weakness (legs) and headaches. Negative for dizziness and light-headedness.  Hematological:  Negative for adenopathy. Does not bruise/bleed easily.  Psychiatric/Behavioral:  Positive for dysphoric mood. Negative for sleep disturbance (wearing CPAP). The patient is  nervous/anxious.    Vitals:   02/19/21 0906  BP: 136/79  Pulse: 71  Resp: 18  SpO2: 100%  Weight: (!) 348 lb 2 oz (157.9 kg)  Height: 5\' 9"  (1.753 m)   Wt Readings from Last 3 Encounters:  02/19/21 (!) 348 lb 2 oz (157.9 kg)  01/23/21 (!) 349 lb (158.3 kg)  12/19/20 (!) 346 lb 9.6 oz (157.2 kg)   Lab Results  Component Value Date   CREATININE 0.87 12/19/2020   CREATININE 0.86 01/08/2020   CREATININE 0.87 11/10/2019    Physical Exam Vitals and nursing note reviewed.  HENT:     Head: Normocephalic and atraumatic.  Cardiovascular:     Rate and Rhythm: Normal rate and regular rhythm.  Pulmonary:     Effort: Pulmonary effort is normal.     Breath sounds: No wheezing or rales.     Comments: Diminished due to chest wall size Abdominal:     General: There is no distension.     Palpations: Abdomen is soft.  Tenderness: There is no abdominal tenderness.  Musculoskeletal:        General: No tenderness.     Cervical back: Normal range of motion and neck supple.     Right lower leg: No tenderness. No edema.     Left lower leg: No tenderness. No edema.  Skin:    General: Skin is warm and dry.  Neurological:     General: No focal deficit present.     Mental Status: He is alert and oriented to person, place, and time.  Psychiatric:        Attention and Perception: Attention normal.        Mood and Affect: Mood is anxious. Mood is not depressed.        Behavior: Behavior normal.    Assessment & Plan:  1. Chronic heart failure with reduced ejection fraction- - NYHA class III - euvolemic today - weighing daily; reminded to call for an overnight weight gain of >2 pounds or a weekly weight gain of >5 pounds - weight stable from last visit here 1 month ago - admits to not being very active because of his sciatica pain although he's trying to increase his activity but gets anxious because he doesn't know how far to push himself - agreeable to pulmonary rehab referral and and  this was placed today - not adding salt to his food and has been reading food labels - saw cardiology Dema Severin) 10/01/20 - on GDMT of carvedilol, entresto, farxiga & spironolactone - HR may not allow for carvedilol titration - will check BMP today since farxiga 10mg  daily started at last visit - BNP 10/19/19 was 101.0  2: HTN- - BP looks good today - saw PCP Mathis Dad) 12/19/20 - CMP from 12/19/20 reviewed and showed sodium 140, potassium 3.8, creatinine 0.87 and GFR 108  3: Anxiety/ depression- - patient says that previously he's talked to the therapist at [name] - does feel like this is more under control  4: Tobacco use- - smoking ~ 1/2 ppd of cigarettes - denies alcohol or drug use - not interested in stopping completely at this time - complete cessation discussed for 3 minutes with the patient.   5: Obstructive sleep apnea- - saw pulmonology Humphrey Rolls) 11/18/20 - got new CPAP and has been wearing this every night   Medication list reviewed.   Return in 3 months or sooner for any questions/problems before then.

## 2021-02-19 ENCOUNTER — Encounter: Payer: Self-pay | Admitting: Family

## 2021-02-19 ENCOUNTER — Other Ambulatory Visit: Payer: Self-pay

## 2021-02-19 ENCOUNTER — Ambulatory Visit: Payer: Medicare Other | Attending: Family | Admitting: Family

## 2021-02-19 VITALS — BP 136/79 | HR 71 | Resp 18 | Ht 69.0 in | Wt 348.1 lb

## 2021-02-19 DIAGNOSIS — Z7982 Long term (current) use of aspirin: Secondary | ICD-10-CM | POA: Insufficient documentation

## 2021-02-19 DIAGNOSIS — Z7984 Long term (current) use of oral hypoglycemic drugs: Secondary | ICD-10-CM | POA: Insufficient documentation

## 2021-02-19 DIAGNOSIS — R531 Weakness: Secondary | ICD-10-CM | POA: Diagnosis not present

## 2021-02-19 DIAGNOSIS — F419 Anxiety disorder, unspecified: Secondary | ICD-10-CM | POA: Insufficient documentation

## 2021-02-19 DIAGNOSIS — I10 Essential (primary) hypertension: Secondary | ICD-10-CM

## 2021-02-19 DIAGNOSIS — Z885 Allergy status to narcotic agent status: Secondary | ICD-10-CM | POA: Diagnosis not present

## 2021-02-19 DIAGNOSIS — F32A Depression, unspecified: Secondary | ICD-10-CM

## 2021-02-19 DIAGNOSIS — F17210 Nicotine dependence, cigarettes, uncomplicated: Secondary | ICD-10-CM | POA: Diagnosis not present

## 2021-02-19 DIAGNOSIS — G4733 Obstructive sleep apnea (adult) (pediatric): Secondary | ICD-10-CM | POA: Insufficient documentation

## 2021-02-19 DIAGNOSIS — I110 Hypertensive heart disease with heart failure: Secondary | ICD-10-CM | POA: Diagnosis not present

## 2021-02-19 DIAGNOSIS — R519 Headache, unspecified: Secondary | ICD-10-CM | POA: Diagnosis not present

## 2021-02-19 DIAGNOSIS — I5022 Chronic systolic (congestive) heart failure: Secondary | ICD-10-CM

## 2021-02-19 DIAGNOSIS — Z720 Tobacco use: Secondary | ICD-10-CM

## 2021-02-19 DIAGNOSIS — Z880 Allergy status to penicillin: Secondary | ICD-10-CM | POA: Insufficient documentation

## 2021-02-19 DIAGNOSIS — Z888 Allergy status to other drugs, medicaments and biological substances status: Secondary | ICD-10-CM | POA: Insufficient documentation

## 2021-02-19 DIAGNOSIS — Z8249 Family history of ischemic heart disease and other diseases of the circulatory system: Secondary | ICD-10-CM | POA: Diagnosis not present

## 2021-02-19 DIAGNOSIS — Z9989 Dependence on other enabling machines and devices: Secondary | ICD-10-CM

## 2021-02-19 DIAGNOSIS — Z881 Allergy status to other antibiotic agents status: Secondary | ICD-10-CM | POA: Insufficient documentation

## 2021-02-19 LAB — BASIC METABOLIC PANEL
Anion gap: 9 (ref 5–15)
BUN: 10 mg/dL (ref 6–20)
CO2: 26 mmol/L (ref 22–32)
Calcium: 9 mg/dL (ref 8.9–10.3)
Chloride: 103 mmol/L (ref 98–111)
Creatinine, Ser: 0.85 mg/dL (ref 0.61–1.24)
GFR, Estimated: >60 mL/min (ref >60)
Glucose, Bld: 96 mg/dL (ref 70–99)
Potassium: 3.6 mmol/L (ref 3.5–5.1)
Sodium: 138 mmol/L (ref 135–145)

## 2021-02-19 NOTE — Patient Instructions (Addendum)
Continue weighing daily and call for an overnight weight gain of > 2 pounds or a weekly weight gain of >5 pounds.    Placed pulmonary rehab order at today's visit (02/19/21)

## 2021-03-20 NOTE — Progress Notes (Signed)
[facility] [place], [place] [address]  Pulmonary Sleep Medicine   Office Visit Note  Patient Name: Marc [name] DOB: August 30, 1975 MRN [id number]    Chief Complaint: Obstructive Sleep Apnea visit  Brief History:  Jayeden is seen today for follow up after new setup. The patient has a 14 year history of sleep apnea. The patient is currently on CPAP 12cmH2O. Patient is using PAP nightly.  The patient feels better  after sleeping with PAP. He sleeps much better with the cpap than without.  The patient reports no problems or complaints from PAP use. Reported sleepiness is  improved  and the Epworth Sleepiness Score is 11 out of 24. The patient does not  take naps. The patient complains of the following: no complaints   The compliance download shows 99% compliance with an average use time of 7 hours 38 min. The AHI is 2.2  The patient does not complain of limb movements disrupting sleep.  ROS  General: (-) fever, (-) chills, (-) night sweat Nose and Sinuses: (-) nasal stuffiness or itchiness, (-) postnasal drip, (-) nosebleeds, (-) sinus trouble. Mouth and Throat: (-) sore throat, (-) hoarseness. Neck: (-) swollen glands, (-) enlarged thyroid, (-) neck pain. Respiratory: - cough, - shortness of breath, - wheezing. Neurologic: + numbness, + tingling. Psychiatric: - anxiety, - depression   Current Medication: Outpatient Encounter Medications as of 03/21/2021  Medication Sig   albuterol (VENTOLIN HFA) 108 (90 Base) MCG/ACT inhaler Inhale 2 puffs into the lungs every 6 (six) hours as needed for wheezing or shortness of breath.   allopurinol (ZYLOPRIM) 100 MG tablet Take 1 tablet (100 mg total) by mouth daily.   aspirin EC 81 MG tablet Take 81 mg by mouth daily.   carvedilol (COREG) 6.25 MG tablet Take 1 tablet (6.25 mg total) by mouth 2 (two) times daily.   cyclobenzaprine (FLEXERIL) 5 MG tablet Take 10 mg by mouth 3 (three) times daily as needed for muscle  spasms. (Patient not taking: No sig reported)   dapagliflozin propanediol (FARXIGA) 10 MG TABS tablet Take 1 tablet (10 mg total) by mouth daily before breakfast.   lidocaine (LIDODERM) 5 % Place 1 patch onto the skin daily as needed (pain). Remove & Discard patch within 12 hours or as directed by MD   sacubitril-valsartan (ENTRESTO) 97-103 MG Take 1 tablet by mouth 2 (two) times daily.   spironolactone (ALDACTONE) 25 MG tablet Take 1 tablet (25 mg total) by mouth daily.   torsemide (DEMADEX) 20 MG tablet Take 40 mg by mouth 2 (two) times daily.   trimethoprim-polymyxin b (POLYTRIM) ophthalmic solution Place 1 drop into the left eye every 6 (six) hours.   vitamin C (ASCORBIC ACID) 500 MG tablet Take 500 mg by mouth daily.   No facility-administered encounter medications on file as of 03/21/2021.    Surgical History: Past Surgical History:  Procedure Laterality Date   BICEPS TENDON REPAIR     COLONOSCOPY WITH PROPOFOL N/A 10/09/2020   Procedure: COLONOSCOPY WITH PROPOFOL;  Surgeon: Lin Landsman, MD;  Location: [facility] ENDOSCOPY;  Service: Gastroenterology;  Laterality: N/A;   FLEXIBLE SIGMOIDOSCOPY N/A 10/30/2020   Procedure: FLEXIBLE SIGMOIDOSCOPY;  Surgeon: Lin Landsman, MD;  Location: [facility] ENDOSCOPY;  Service: Gastroenterology;  Laterality: N/A;   FOOT SURGERY     metataral and fasciotomy   RIGHT/LEFT HEART CATH AND CORONARY ANGIOGRAPHY Bilateral 09/19/2020   Procedure: RIGHT/LEFT HEART CATH AND CORONARY ANGIOGRAPHY;  Surgeon: Yolonda Kida, MD;  Location: [place] CV LAB;  Service: Cardiovascular;  Laterality: Bilateral;   ROTATOR CUFF REPAIR      Medical History: Past Medical History:  Diagnosis Date   Bipolar affective ([place])    CHF (congestive heart failure) ([facility])    Hypertension     Family History: Non contributory to the present illness  Social History: Social History   Socioeconomic History   Marital status: Married    Spouse name: Not on file    Number of children: Not on file   Years of education: Not on file   Highest education level: Not on file  Occupational History   Not on file  Tobacco Use   Smoking status: Every Day    Packs/day: 0.50    Years: 25.00    Pack years: 12.50    Types: Cigarettes   Smokeless tobacco: Never  Vaping Use   Vaping Use: Never used  Substance and Sexual Activity   Alcohol use: Not Currently   Drug use: Not Currently   Sexual activity: Not Currently  Other Topics Concern   Not on file  Social History Narrative   Not on file   Social Determinants of Health   Financial Resource Strain: Not on file  Food Insecurity: Not on file  Transportation Needs: Not on file  Physical Activity: Not on file  Stress: Not on file  Social Connections: Not on file  Intimate Partner Violence: Not on file    Vital Signs: Height '5\' 9"'$  (1.753 m), weight (!) 358 lb (162.4 kg).  Examination: General Appearance: The patient is well-developed, well-nourished, and in no distress. Neck Circumference:  Skin: Gross inspection of skin unremarkable. Head: normocephalic, no gross deformities. Eyes: no gross deformities noted. ENT: ears appear grossly normal Neurologic: Alert and oriented. No involuntary movements.    EPWORTH SLEEPINESS SCALE:  Scale:  (0)= no chance of dozing; (1)= slight chance of dozing; (2)= moderate chance of dozing; (3)= high chance of dozing  Chance  Situtation    Sitting and reading: 2    Watching TV: 1    Sitting Inactive in public: 3    As a passenger in car: 2      Lying down to rest: 1    Sitting and talking: 1    Sitting quielty after lunch: 1    In a car, stopped in traffic: 0   TOTAL SCORE:   11 out of 24    SLEEP STUDIES:  PSG 04/30/07 CPAP 10cmH2O PSG 10/18/20 AHI 82.8, Lowest SpO2 89% Titration 10/31/20 CPAP 12cmH2O   CPAP COMPLIANCE DATA:  Date Range: 12/20/20-03/19/21  Average Daily Use: 7 hours 78mns  Median Use: 7hrs 38 min  Compliance  for > 4 Hours: 99% days  AHI: 2.2 respiratory events per hour  Days Used: 90/90  Mask Leak: 25.6  95th Percentile Pressure: 12         LABS: Recent Results (from the past 2160 hour(s))  Basic metabolic panel     Status: None   Collection Time: 02/19/21  9:50 AM  Result Value Ref Range   Sodium 138 135 - 145 mmol/L   Potassium 3.6 3.5 - 5.1 mmol/L   Chloride 103 98 - 111 mmol/L   CO2 26 22 - 32 mmol/L   Glucose, Bld 96 70 - 99 mg/dL    Comment: Glucose reference range applies only to samples taken after fasting for at least 8 hours.   BUN 10 6 - 20 mg/dL   Creatinine, Ser 0.85  0.61 - 1.24 mg/dL   Calcium 9.0 8.9 - 10.3 mg/dL   GFR, Estimated >60 >60 mL/min    Comment: (NOTE) Calculated using the CKD-EPI Creatinine Equation (2021)    Anion gap 9 5 - 15    Comment: Performed at [facility], [address] [place], [place] [address]    Radiology: No results found.  No results found.  No results found.    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 OSA on CPAP The patient does tolerate PAP and reports definite benefit from PAP use. The patient was reminded how to clean equipment and advised to replace supplies routinely. The patient was also counselled on weight loss. The compliance is excellent. The AHI is 2.2.   OSA- continue with excellent compliance with cpap. F/u 53m  2. CPAP use counseling CPAP Counseling: had a lengthy discussion with the patient regarding the importance of PAP therapy in management of the sleep apnea. Patient appears to understand  the risk factor reduction and also understands the risks associated with untreated sleep apnea. Patient will try to make a good faith effort to remain compliant with therapy. Also instructed the patient on proper cleaning of the device including the water must be changed daily if possible and use of distilled water is preferred. Patient understands that the machine should be regularly cleaned with appropriate recommended cleaning solutions that do not damage the PAP machine for example given white vinegar and water rinses. Other methods such as ozone treatment may not be as good as these simple methods to achieve cleaning.   3. Primary hypertension Hypertension Counseling:   The following hypertensive lifestyle modification were recommended and discussed:  1. Limiting alcohol intake to less than 1 oz/day of ethanol:(24 oz of beer or 8 oz of wine or 2 oz of 100-proof whiskey). 2. Take baby ASA 81 mg daily. 3. Importance of regular aerobic exercise and losing weight. 4. Reduce dietary saturated fat and cholesterol intake for overall cardiovascular health. 5. Maintaining adequate dietary potassium, calcium, and magnesium intake. 6. Regular monitoring of the blood pressure. 7. Reduce sodium intake to less than 100 mmol/day (less than 2.3 gm of sodium or less than 6 gm of sodium choride)    4. Congestive heart failure, unspecified HF chronicity, unspecified heart failure type ([place] Continue f/u with cardiology.     General Counseling: I have discussed the findings of the evaluation and examination with Eriberto.  I have also discussed any further diagnostic evaluation thatmay be needed or ordered today. Neizan verbalizes understanding of the findings of todays visit. We also reviewed his medications today and discussed drug interactions and side effects including but not limited excessive drowsiness and altered mental states. We also discussed that there is always a risk not just to him but also people  around him. he has been encouraged to call the office with any questions or concerns that should arise related to todays visit.  No orders of the defined types were placed in this encounter.       I have personally  obtained a history, examined the patient, evaluated laboratory and imaging results, formulated the assessment and plan and placed orders.   This patient was seen today by Tressie Ellis, PA-C in collaboration with Dr. Devona Konig.    Allyne Gee, MD [facility] Diplomate ABMS Pulmonary and Critical Care Medicine Sleep medicine

## 2021-03-21 ENCOUNTER — Encounter: Payer: Medicare Other | Admitting: Nurse Practitioner

## 2021-03-21 ENCOUNTER — Ambulatory Visit (INDEPENDENT_AMBULATORY_CARE_PROVIDER_SITE_OTHER): Payer: Medicare Other | Admitting: Internal Medicine

## 2021-03-21 VITALS — Ht 69.0 in | Wt 358.0 lb

## 2021-03-21 DIAGNOSIS — Z7189 Other specified counseling: Secondary | ICD-10-CM

## 2021-03-21 DIAGNOSIS — G4733 Obstructive sleep apnea (adult) (pediatric): Secondary | ICD-10-CM | POA: Diagnosis not present

## 2021-03-21 DIAGNOSIS — I10 Essential (primary) hypertension: Secondary | ICD-10-CM | POA: Diagnosis not present

## 2021-03-21 DIAGNOSIS — I509 Heart failure, unspecified: Secondary | ICD-10-CM | POA: Diagnosis not present

## 2021-03-21 DIAGNOSIS — Z9989 Dependence on other enabling machines and devices: Secondary | ICD-10-CM | POA: Diagnosis not present

## 2021-03-21 NOTE — Patient Instructions (Signed)

## 2021-03-24 DIAGNOSIS — G4733 Obstructive sleep apnea (adult) (pediatric): Secondary | ICD-10-CM | POA: Diagnosis not present

## 2021-04-20 DIAGNOSIS — G4733 Obstructive sleep apnea (adult) (pediatric): Secondary | ICD-10-CM | POA: Diagnosis not present

## 2021-04-24 ENCOUNTER — Encounter: Payer: Self-pay | Admitting: Nurse Practitioner

## 2021-04-24 ENCOUNTER — Other Ambulatory Visit: Payer: Self-pay

## 2021-04-24 ENCOUNTER — Ambulatory Visit (INDEPENDENT_AMBULATORY_CARE_PROVIDER_SITE_OTHER): Payer: Medicare Other | Admitting: Nurse Practitioner

## 2021-04-24 VITALS — BP 116/75 | HR 73 | Temp 97.9°F | Ht 68.0 in | Wt 360.6 lb

## 2021-04-24 DIAGNOSIS — I509 Heart failure, unspecified: Secondary | ICD-10-CM | POA: Diagnosis not present

## 2021-04-24 DIAGNOSIS — Z1159 Encounter for screening for other viral diseases: Secondary | ICD-10-CM | POA: Diagnosis not present

## 2021-04-24 DIAGNOSIS — Z9989 Dependence on other enabling machines and devices: Secondary | ICD-10-CM | POA: Diagnosis not present

## 2021-04-24 DIAGNOSIS — Z0000 Encounter for general adult medical examination without abnormal findings: Secondary | ICD-10-CM

## 2021-04-24 DIAGNOSIS — Z23 Encounter for immunization: Secondary | ICD-10-CM | POA: Diagnosis not present

## 2021-04-24 DIAGNOSIS — G4733 Obstructive sleep apnea (adult) (pediatric): Secondary | ICD-10-CM | POA: Diagnosis not present

## 2021-04-24 DIAGNOSIS — Z6841 Body Mass Index (BMI) 40.0 and over, adult: Secondary | ICD-10-CM | POA: Diagnosis not present

## 2021-04-24 DIAGNOSIS — I10 Essential (primary) hypertension: Secondary | ICD-10-CM | POA: Diagnosis not present

## 2021-04-24 DIAGNOSIS — N529 Male erectile dysfunction, unspecified: Secondary | ICD-10-CM | POA: Diagnosis not present

## 2021-04-24 LAB — URINALYSIS, ROUTINE W REFLEX MICROSCOPIC
Bilirubin, UA: NEGATIVE
Ketones, UA: NEGATIVE
Leukocytes,UA: NEGATIVE
Nitrite, UA: NEGATIVE
Specific Gravity, UA: 1.015 (ref 1.005–1.030)
Urobilinogen, Ur: 0.2 mg/dL (ref 0.2–1.0)
pH, UA: 7.5 (ref 5.0–7.5)

## 2021-04-24 LAB — MICROSCOPIC EXAMINATION
Bacteria, UA: NONE SEEN
WBC, UA: NONE SEEN /hpf (ref 0–5)

## 2021-04-24 MED ORDER — SILDENAFIL CITRATE 100 MG PO TABS: 50.0000 mg | ORAL_TABLET | Freq: Every day | ORAL | 2 refills | Status: DC | PRN

## 2021-04-24 NOTE — Assessment & Plan Note (Signed)
Chronic.  Controlled.  Continue with current medication regimen.  Followed by HF clinic. Labs ordered today.  Return to clinic in 3 months for reevaluation.  Call sooner if concerns arise.

## 2021-04-24 NOTE — Assessment & Plan Note (Signed)
Patient recently recceived a new CPAP machine and it is working well for him.  He uses it every night.

## 2021-04-24 NOTE — Progress Notes (Signed)
BP 116/75   Pulse 73   Temp 97.9 F (36.6 C) (Oral)   Ht '5\' 8"'$  (1.727 m)   Wt (!) 360 lb 9.6 oz (163.6 kg)   SpO2 97%   BMI 54.83 kg/m    Subjective:    Patient ID: Marc [name], male    DOB: 03-02-1976, 45 y.o.   MRN: [id number]  HPI: Marc [name] is a 45 y.o. male presenting on 04/24/2021 for comprehensive medical examination. Current medical complaints include:none  He currently lives with: Interim Problems from his last visit: no  HYPERTENSION/CHF- Patient doesn't always take his torsemide BID due to the muscle spasms.   Hypertension status: controlled  Satisfied with current treatment? yes Duration of hypertension: years BP monitoring frequency:  not checking BP range:  BP medication side effects:  no Medication compliance: excellent compliance Previous BP meds: torosemide, carvedilol, and spironalactone Aspirin: yes Recurrent headaches: no Visual changes: no Palpitations: no Dyspnea: SOB Chest pain: no Lower extremity edema: no Dizzy/lightheaded: no   Depression Screen done today and results listed below:  Depression screen [facility] 2/9 04/24/2021 02/19/2021  Decreased Interest 0 1  Down, Depressed, Hopeless 0 1  PHQ - 2 Score 0 2  Altered sleeping - 0  Tired, decreased energy - 1  Change in appetite - 2  Feeling bad or failure about yourself  - 1  Trouble concentrating - 1  Moving slowly or fidgety/restless - 0  Suicidal thoughts - 0  PHQ-9 Score - 7  Difficult doing work/chores - Somewhat difficult    The patient does not have a history of falls. I did complete a risk assessment for falls. A plan of care for falls was documented.   Past Medical History:  Past Medical History:  Diagnosis Date   Bipolar affective ([place])    CHF (congestive heart failure) ([place])    Hypertension     Surgical History:  Past Surgical History:  Procedure Laterality Date   BICEPS TENDON REPAIR     COLONOSCOPY WITH PROPOFOL N/A 10/09/2020   Procedure: COLONOSCOPY WITH  PROPOFOL;  Surgeon: Lin Landsman, MD;  Location: [facility] ENDOSCOPY;  Service: Gastroenterology;  Laterality: N/A;   FLEXIBLE SIGMOIDOSCOPY N/A 10/30/2020   Procedure: FLEXIBLE SIGMOIDOSCOPY;  Surgeon: Lin Landsman, MD;  Location: [facility] ENDOSCOPY;  Service: Gastroenterology;  Laterality: N/A;   FOOT SURGERY     metataral and fasciotomy   RIGHT/LEFT HEART CATH AND CORONARY ANGIOGRAPHY Bilateral 09/19/2020   Procedure: RIGHT/LEFT HEART CATH AND CORONARY ANGIOGRAPHY;  Surgeon: Yolonda Kida, MD;  Location: [place] CV LAB;  Service: Cardiovascular;  Laterality: Bilateral;   ROTATOR CUFF REPAIR      Medications:  Current Outpatient Medications on File Prior to Visit  Medication Sig   albuterol (VENTOLIN HFA) 108 (90 Base) MCG/ACT inhaler Inhale 2 puffs into the lungs every 6 (six) hours as needed for wheezing or shortness of breath.   allopurinol (ZYLOPRIM) 100 MG tablet Take 1 tablet (100 mg total) by mouth daily.   aspirin EC 81 MG tablet Take 81 mg by mouth daily.   carvedilol (COREG) 6.25 MG tablet Take 1 tablet (6.25 mg total) by mouth 2 (two) times daily.   cyclobenzaprine (FLEXERIL) 5 MG tablet Take 10 mg by mouth 3 (three) times daily as needed for muscle spasms.   dapagliflozin propanediol (FARXIGA) 10 MG TABS tablet Take 1 tablet (10 mg total) by mouth daily before breakfast.   lidocaine (LIDODERM) 5 % Place 1 patch onto the skin  daily as needed (pain). Remove & Discard patch within 12 hours or as directed by MD   sacubitril-valsartan (ENTRESTO) 97-103 MG Take 1 tablet by mouth 2 (two) times daily.   spironolactone (ALDACTONE) 25 MG tablet Take 1 tablet (25 mg total) by mouth daily.   torsemide (DEMADEX) 20 MG tablet Take 40 mg by mouth 2 (two) times daily.   vitamin C (ASCORBIC ACID) 500 MG tablet Take 500 mg by mouth daily.   No current facility-administered medications on file prior to visit.    Allergies:  Allergies  Allergen Reactions   Geodon [Ziprasidone]  Other (See Comments)    paralysis   Tramadol Other (See Comments)    Pt stated that it gave him sores.   Amoxicillin Rash   Zithromax [Azithromycin] Rash    Social History:  Social History   Socioeconomic History   Marital status: Married    Spouse name: Not on file   Number of children: Not on file   Years of education: Not on file   Highest education level: Not on file  Occupational History   Not on file  Tobacco Use   Smoking status: Every Day    Packs/day: 0.50    Years: 25.00    Pack years: 12.50    Types: Cigarettes   Smokeless tobacco: Never  Vaping Use   Vaping Use: Never used  Substance and Sexual Activity   Alcohol use: Not Currently   Drug use: Not Currently   Sexual activity: Yes  Other Topics Concern   Not on file  Social History Narrative   Not on file   Social Determinants of Health   Financial Resource Strain: Not on file  Food Insecurity: Not on file  Transportation Needs: Not on file  Physical Activity: Not on file  Stress: Not on file  Social Connections: Not on file  Intimate Partner Violence: Not on file   Social History   Tobacco Use  Smoking Status Every Day   Packs/day: 0.50   Years: 25.00   Pack years: 12.50   Types: Cigarettes  Smokeless Tobacco Never   Social History   Substance and Sexual Activity  Alcohol Use Not Currently    Family History:  Family History  Problem Relation Age of Onset   Stroke Mother    Dementia Mother    Hypertension Father    Diabetes Father    Hypertension Paternal Grandfather    Diabetes Paternal Grandfather     Past medical history, surgical history, medications, allergies, family history and social history reviewed with patient today and changes made to appropriate areas of the chart.   Review of Systems  Constitutional:        Weight gain  Eyes:  Negative for blurred vision and double vision.  Respiratory:  Positive for shortness of breath.   Cardiovascular:  Negative for chest  pain, palpitations and leg swelling.  Neurological:  Negative for dizziness and headaches.       Muscle spasm  All other ROS negative except what is listed above and in the HPI.      Objective:    BP 116/75   Pulse 73   Temp 97.9 F (36.6 C) (Oral)   Ht '5\' 8"'$  (1.727 m)   Wt (!) 360 lb 9.6 oz (163.6 kg)   SpO2 97%   BMI 54.83 kg/m   Wt Readings from Last 3 Encounters:  04/24/21 (!) 360 lb 9.6 oz (163.6 kg)  03/21/21 (!) 358 lb (162.4 kg)  02/19/21 (!) 348 lb 2 oz (157.9 kg)    Physical Exam Vitals and nursing note reviewed.  Constitutional:      General: He is not in acute distress.    Appearance: Normal appearance. He is obese. He is not ill-appearing, toxic-appearing or diaphoretic.  HENT:     Head: Normocephalic.     Right Ear: Tympanic membrane, ear canal and external ear normal.     Left Ear: Tympanic membrane, ear canal and external ear normal.     Nose: Nose normal. No congestion or rhinorrhea.     Mouth/Throat:     Mouth: Mucous membranes are moist.  Eyes:     General:        Right eye: No discharge.        Left eye: No discharge.     Extraocular Movements: Extraocular movements intact.     Conjunctiva/sclera: Conjunctivae normal.     Pupils: Pupils are equal, round, and reactive to light.  Cardiovascular:     Rate and Rhythm: Normal rate and regular rhythm.     Heart sounds: No murmur heard. Pulmonary:     Effort: Pulmonary effort is normal. No respiratory distress.     Breath sounds: Normal breath sounds. No wheezing, rhonchi or rales.  Abdominal:     General: Abdomen is flat. Bowel sounds are normal. There is no distension.     Palpations: Abdomen is soft.     Tenderness: There is no abdominal tenderness. There is no guarding.  Musculoskeletal:     Cervical back: Normal range of motion and neck supple.     Right lower leg: Edema present.     Left lower leg: Edema present.  Skin:    General: Skin is warm and dry.     Capillary Refill: Capillary refill  takes less than 2 seconds.  Neurological:     General: No focal deficit present.     Mental Status: He is alert and oriented to person, place, and time.     Cranial Nerves: No cranial nerve deficit.     Motor: No weakness.     Deep Tendon Reflexes: Reflexes normal.  Psychiatric:        Mood and Affect: Mood normal.        Behavior: Behavior normal.        Thought Content: Thought content normal.        Judgment: Judgment normal.    Results for orders placed or performed in visit on 04/24/21  Microscopic Examination   Urine  Result Value Ref Range   WBC, UA None seen 0 - 5 /hpf   RBC 0-2 0 - 2 /hpf   Epithelial Cells (non renal) 0-10 0 - 10 /hpf   Bacteria, UA None seen None seen/Few  Urinalysis, Routine w reflex microscopic  Result Value Ref Range   Specific Gravity, UA 1.015 1.005 - 1.030   pH, UA 7.5 5.0 - 7.5   Color, UA Yellow Yellow   Appearance Ur Clear Clear   Leukocytes,UA Negative Negative   Protein,UA 1+ (A) Negative/Trace   Glucose, UA 3+ (A) Negative   Ketones, UA Negative Negative   RBC, UA Trace (A) Negative   Bilirubin, UA Negative Negative   Urobilinogen, Ur 0.2 0.2 - 1.0 mg/dL   Nitrite, UA Negative Negative   Microscopic Examination See below:       Assessment & Plan:   Problem List Items Addressed This Visit       Cardiovascular and Mediastinum  Congestive heart failure (HCC)    Chronic. Patient endorses SOB and has a 12lb weight gain over the last two months. Not taking Torsemide BID as prescribed everyday due to the muscle spasms it is causing. Reached out to HF NP who will be moving his follow up with her up to next week. Discussed with patient the importance of following regimen closely and weighing himself daily.       Relevant Medications   sildenafil (VIAGRA) 100 MG tablet   Hypertension    Chronic.  Controlled.  Continue with current medication regimen.  Followed by HF clinic. Labs ordered today.  Return to clinic in 3 months for  reevaluation.  Call sooner if concerns arise.        Relevant Medications   sildenafil (VIAGRA) 100 MG tablet     Respiratory   OSA on CPAP    Patient recently recceived a new CPAP machine and it is working well for him.  He uses it every night.          Other   Morbid obesity ([place])    Recommend a healthy lifestyle through diet and exercise.      BMI 50.0-59.9, adult ([place])    Recommend a healthy lifestyle through diet and exercise.      Erectile dysfunction    Will send Viagra to the pharmacy to help patient with ED. Patient denies Chest pain. Side effects and benefits of medication discussed with patient during visit.       Other Visit Diagnoses     Annual physical exam    -  Primary   Relevant Orders   TSH   PSA   Lipid panel   CBC with Differential/Platelet   Comprehensive metabolic panel   Urinalysis, Routine w reflex microscopic (Completed)   Encounter for hepatitis C screening test for low risk patient       Relevant Orders   Hepatitis C Antibody   Need for influenza vaccination       Relevant Orders   Flu Vaccine QUAD 34moIM (Fluarix, Fluzone & Alfiuria Quad PF) (Completed)        Discussed aspirin prophylaxis for myocardial infarction prevention and decision was made to continue ASA  LABORATORY TESTING:  Health maintenance labs ordered today as discussed above.   The natural history of prostate cancer and ongoing controversy regarding screening and potential treatment outcomes of prostate cancer has been discussed with the patient. The meaning of a false positive PSA and a false negative PSA has been discussed. He indicates understanding of the limitations of this screening test and wishes to proceed with screening PSA testing.   IMMUNIZATIONS:   - Tdap: Tetanus vaccination status reviewed: On Medicare. - Influenza: Up to date - Pneumovax: Up to date - Prevnar: Not applicable - HPV: Not applicable - Zostavax vaccine: Not applicable  SCREENING: -  Colonoscopy: Up to date  Discussed with patient purpose of the colonoscopy is to detect colon cancer at curable precancerous or early stages   - AAA Screening: Not applicable  -Hearing Test: Not applicable  -Spirometry: Not applicable   PATIENT COUNSELING:    Sexuality: Discussed sexually transmitted diseases, partner selection, use of condoms, avoidance of unintended pregnancy  and contraceptive alternatives.   Advised to avoid cigarette smoking.  I discussed with the patient that most people either abstain from alcohol or drink within safe limits (<=14/week and <=4 drinks/occasion for males, <=7/weeks and <= 3 drinks/occasion for females) and that the risk for alcohol disorders  and other health effects rises proportionally with the number of drinks per week and how often a drinker exceeds daily limits.  Discussed cessation/primary prevention of drug use and availability of treatment for abuse.   Diet: Encouraged to adjust caloric intake to maintain  or achieve ideal body weight, to reduce intake of dietary saturated fat and total fat, to limit sodium intake by avoiding high sodium foods and not adding table salt, and to maintain adequate dietary potassium and calcium preferably from fresh fruits, vegetables, and low-fat dairy products.    stressed the importance of regular exercise  Injury prevention: Discussed safety belts, safety helmets, smoke detector, smoking near bedding or upholstery.   Dental health: Discussed importance of regular tooth brushing, flossing, and dental visits.   Follow up plan: NEXT PREVENTATIVE PHYSICAL DUE IN 1 YEAR. Return in about 3 months (around 07/24/2021) for HTN, HLD, DM2 FU.

## 2021-04-24 NOTE — Assessment & Plan Note (Signed)
Will send Viagra to the pharmacy to help patient with ED. Patient denies Chest pain. Side effects and benefits of medication discussed with patient during visit.

## 2021-04-24 NOTE — Assessment & Plan Note (Signed)
Recommend a healthy lifestyle through diet and exercise.  °

## 2021-04-24 NOTE — Assessment & Plan Note (Signed)
Chronic. Patient endorses SOB and has a 12lb weight gain over the last two months. Not taking Torsemide BID as prescribed everyday due to the muscle spasms it is causing. Reached out to HF NP who will be moving his follow up with her up to next week. Discussed with patient the importance of following regimen closely and weighing himself daily.

## 2021-04-25 NOTE — Progress Notes (Signed)
Hi Marc [name].  Your lab work looks good.  Triglycerides are elevated. I recommend decreasing your processed good and refined sugar intake.  Your urine is likely abnormal due to the [place].  We will continue to monitor your blood work in the future.  Please let me know if you have any questions.

## 2021-04-26 LAB — CBC WITH DIFFERENTIAL/PLATELET
Basophils Absolute: 0.1 10*3/uL (ref 0.0–0.2)
Basos: 1 %
EOS (ABSOLUTE): 0.2 10*3/uL (ref 0.0–0.4)
Eos: 2 %
Hematocrit: 40.5 % (ref 37.5–51.0)
Hemoglobin: 12.6 g/dL — ABNORMAL LOW (ref 13.0–17.7)
Immature Grans (Abs): 0 10*3/uL (ref 0.0–0.1)
Immature Granulocytes: 1 %
Lymphocytes Absolute: 2.4 10*3/uL (ref 0.7–3.1)
Lymphs: 29 %
MCH: 23.2 pg — ABNORMAL LOW (ref 26.6–33.0)
MCHC: 31.1 g/dL — ABNORMAL LOW (ref 31.5–35.7)
MCV: 75 fL — ABNORMAL LOW (ref 79–97)
Monocytes Absolute: 0.6 10*3/uL (ref 0.1–0.9)
Monocytes: 7 %
Neutrophils Absolute: 5.1 10*3/uL (ref 1.4–7.0)
Neutrophils: 60 %
Platelets: 278 10*3/uL (ref 150–450)
RBC: 5.43 x10E6/uL (ref 4.14–5.80)
RDW: 16.4 % — ABNORMAL HIGH (ref 11.6–15.4)
WBC: 8.2 10*3/uL (ref 3.4–10.8)

## 2021-04-26 LAB — COMPREHENSIVE METABOLIC PANEL
ALT: 11 IU/L (ref 0–44)
AST: 19 IU/L (ref 0–40)
Albumin/Globulin Ratio: 1.1 — ABNORMAL LOW (ref 1.2–2.2)
Albumin: 3.9 g/dL — ABNORMAL LOW (ref 4.0–5.0)
Alkaline Phosphatase: 118 IU/L (ref 44–121)
BUN/Creatinine Ratio: 10 (ref 9–20)
BUN: 9 mg/dL (ref 6–24)
Bilirubin Total: <0.2 mg/dL (ref 0.0–1.2)
CO2: 23 mmol/L (ref 20–29)
Calcium: 8.9 mg/dL (ref 8.7–10.2)
Chloride: 99 mmol/L (ref 96–106)
Creatinine, Ser: 0.86 mg/dL (ref 0.76–1.27)
Globulin, Total: 3.4 g/dL (ref 1.5–4.5)
Glucose: 82 mg/dL (ref 65–99)
Potassium: 4.2 mmol/L (ref 3.5–5.2)
Sodium: 139 mmol/L (ref 134–144)
Total Protein: 7.3 g/dL (ref 6.0–8.5)
eGFR: 108 mL/min/{1.73_m2} (ref >59)

## 2021-04-26 LAB — HEPATITIS C ANTIBODY: Hep C Virus Ab: <0.1 s/co ratio (ref 0.0–0.9)

## 2021-04-26 LAB — LIPID PANEL
Chol/HDL Ratio: 3.6 ratio (ref 0.0–5.0)
Cholesterol, Total: 184 mg/dL (ref 100–199)
HDL: 51 mg/dL (ref >39)
LDL Chol Calc (NIH): 102 mg/dL — ABNORMAL HIGH (ref 0–99)
Triglycerides: 179 mg/dL — ABNORMAL HIGH (ref 0–149)
VLDL Cholesterol Cal: 31 mg/dL (ref 5–40)

## 2021-04-26 LAB — TSH: TSH: 1.49 u[IU]/mL (ref 0.450–4.500)

## 2021-04-26 LAB — PSA: Prostate Specific Ag, Serum: <0.1 ng/mL (ref 0.0–4.0)

## 2021-04-28 ENCOUNTER — Ambulatory Visit: Payer: Medicare Other

## 2021-04-28 NOTE — Progress Notes (Signed)
Subjective:   Marc [name] is a 45 y.o. male who presents for Medicare Annual/Subsequent preventive examination. I connected with  Guy L Breese on 04/28/21 by an audio only telemedicine application and verified that I am speaking with the correct person using two identifiers.   I discussed the limitations, risks, security and privacy concerns of performing an evaluation and management service by telephone and the availability of in person appointments. I also discussed with the patient that there may be a patient responsible charge related to this service. The patient expressed understanding and verbally consented to this telephonic visit.  Location of Patient: Home  Location of Provider: Office  List any persons and their role that are participating in the visit with the patient.   Review of Systems    Defer to PCP       Objective:    There were no vitals filed for this visit. There is no height or weight on file to calculate BMI.  Advanced Directives 10/30/2020 10/09/2020 01/29/2019 01/29/2019 10/11/2018 10/09/2018  Does Patient Have a Medical Advance Directive? No No No No No Yes  Type of Advance Directive - - - - - Living will  Would patient like information on creating a medical advance directive? No - Patient declined - No - Patient declined - No - Patient declined -    Current Medications (verified) Outpatient Encounter Medications as of 04/28/2021  Medication Sig   albuterol (VENTOLIN HFA) 108 (90 Base) MCG/ACT inhaler Inhale 2 puffs into the lungs every 6 (six) hours as needed for wheezing or shortness of breath.   allopurinol (ZYLOPRIM) 100 MG tablet Take 1 tablet (100 mg total) by mouth daily.   aspirin EC 81 MG tablet Take 81 mg by mouth daily.   carvedilol (COREG) 6.25 MG tablet Take 1 tablet (6.25 mg total) by mouth 2 (two) times daily.   cyclobenzaprine (FLEXERIL) 5 MG tablet Take 10 mg by mouth 3 (three) times daily as needed for muscle spasms.   dapagliflozin  propanediol (FARXIGA) 10 MG TABS tablet Take 1 tablet (10 mg total) by mouth daily before breakfast.   lidocaine (LIDODERM) 5 % Place 1 patch onto the skin daily as needed (pain). Remove & Discard patch within 12 hours or as directed by MD   sacubitril-valsartan (ENTRESTO) 97-103 MG Take 1 tablet by mouth 2 (two) times daily.   sildenafil (VIAGRA) 100 MG tablet Take 0.5-1 tablets (50-100 mg total) by mouth daily as needed for erectile dysfunction.   spironolactone (ALDACTONE) 25 MG tablet Take 1 tablet (25 mg total) by mouth daily.   torsemide (DEMADEX) 20 MG tablet Take 40 mg by mouth 2 (two) times daily.   vitamin C (ASCORBIC ACID) 500 MG tablet Take 500 mg by mouth daily.   No facility-administered encounter medications on file as of 04/28/2021.    Allergies (verified) Geodon [ziprasidone], Tramadol, Amoxicillin, and Zithromax [azithromycin]   History: Past Medical History:  Diagnosis Date   Bipolar affective ([place])    CHF (congestive heart failure) ([place])    Hypertension    Past Surgical History:  Procedure Laterality Date   BICEPS TENDON REPAIR     COLONOSCOPY WITH PROPOFOL N/A 10/09/2020   Procedure: COLONOSCOPY WITH PROPOFOL;  Surgeon: Lin Landsman, MD;  Location: [facility] ENDOSCOPY;  Service: Gastroenterology;  Laterality: N/A;   FLEXIBLE SIGMOIDOSCOPY N/A 10/30/2020   Procedure: FLEXIBLE SIGMOIDOSCOPY;  Surgeon: Lin Landsman, MD;  Location: [facility] ENDOSCOPY;  Service: Gastroenterology;  Laterality: N/A;   FOOT SURGERY  metataral and fasciotomy   RIGHT/LEFT HEART CATH AND CORONARY ANGIOGRAPHY Bilateral 09/19/2020   Procedure: RIGHT/LEFT HEART CATH AND CORONARY ANGIOGRAPHY;  Surgeon: Yolonda Kida, MD;  Location: [facility] [facility];  Service: Cardiovascular;  Laterality: Bilateral;   ROTATOR CUFF REPAIR     Family History  Problem Relation Age of Onset   Stroke Mother    Dementia Mother    Hypertension Father    Diabetes Father    Hypertension Paternal  Grandfather    Diabetes Paternal Grandfather    Social History   Socioeconomic History   Marital status: Married    Spouse name: Not on file   Number of children: Not on file   Years of education: Not on file   Highest education level: Not on file  Occupational History   Not on file  Tobacco Use   Smoking status: Every Day    Packs/day: 0.50    Years: 25.00    Pack years: 12.50    Types: Cigarettes   Smokeless tobacco: Never  Vaping Use   Vaping Use: Never used  Substance and Sexual Activity   Alcohol use: Not Currently   Drug use: Not Currently   Sexual activity: Yes  Other Topics Concern   Not on file  Social History Narrative   Not on file   Social Determinants of Health   Financial Resource Strain: Not on file  Food Insecurity: Not on file  Transportation Needs: Not on file  Physical Activity: Not on file  Stress: Not on file  Social Connections: Not on file    Tobacco Counseling Ready to quit: Not Answered Counseling given: Not Answered   Clinical Intake:                 Diabetic?yes          Activities of Daily Living In your present state of health, do you have any difficulty performing the following activities: 04/24/2021 02/19/2021  Hearing? N N  Vision? N N  Difficulty concentrating or making decisions? Y N  Walking or climbing stairs? N Y  Dressing or bathing? N N  Doing errands, shopping? N N  Some recent data might be hidden    Patient Care Team: Jon Billings, NP as PCP - General (Nurse Practitioner)  Indicate any recent Medical Services you may have received from other than [facility] providers in the past year (date may be approximate).     Assessment:   This is a routine wellness examination for Marc [name].  Hearing/Vision screen No results found.  Dietary issues and exercise activities discussed:     Goals Addressed   None    Depression Screen PHQ 2/9 Scores 04/24/2021 02/19/2021  PHQ - 2 Score 0 2  PHQ- 9  Score - 7    Fall Risk Fall Risk  04/24/2021 02/19/2021 01/23/2021 12/09/2020 08/12/2020  Falls in the past year? 1 1 0 0 0  Number falls in past yr: 1 1 0 0 0  Injury with Fall? 0 1 0 0 0  Risk for fall due to : Impaired balance/gait Impaired balance/gait;History of fall(s) - - -  Follow up Falls evaluation completed Falls evaluation completed;Falls prevention discussed Falls evaluation completed Falls evaluation completed Falls evaluation completed    FALL RISK PREVENTION PERTAINING TO THE HOME:  Any stairs in or around the home? No  If so, are there any without handrails? No  Home free of loose throw rugs in walkways, pet beds, electrical cords, etc? No  Adequate lighting in your home to reduce risk of falls? No   ASSISTIVE DEVICES UTILIZED TO PREVENT FALLS:  Life alert? No  Use of a cane, walker or w/c? No  Grab bars in the bathroom? Yes  Shower chair or bench in shower? No  Elevated toilet seat or a handicapped toilet? No   TIMED UP AND GO:  Was the test performed? No .  Length of time to ambulate 10 feet: 0 sec.     Cognitive Function:        Immunizations Immunization History  Administered Date(s) Administered   Influenza, Seasonal, Injecte, Preservative Fre 07/10/2014   Influenza,inj,Quad PF,6+ Mos 10/10/2018, 04/24/2021   Influenza-Unspecified 07/29/2011   Pneumococcal Polysaccharide-23 08/17/2018    TDAP status: Up to date  Flu Vaccine status: Up to date  Pneumococcal vaccine status: Up to date  Covid-19 vaccine status: Completed vaccines  Qualifies for Shingles Vaccine? No   Zostavax completed No   Shingrix Completed?: No.    Education has been provided regarding the importance of this vaccine. Patient has been advised to call insurance company to determine out of pocket expense if they have not yet received this vaccine. Advised may also receive vaccine at local pharmacy or Health Dept. Verbalized acceptance and understanding.  Screening Tests Health  Maintenance  Topic Date Due   COVID-19 Vaccine (1) Never done   Pneumococcal Vaccine 25-92 Years old (2 - PCV) 08/18/2019   TETANUS/TDAP  04/24/2022 (Originally 07/14/1993)   COLONOSCOPY (Pts 45-38yr Insurance coverage will need to be confirmed)  10/09/2021   INFLUENZA VACCINE  Completed   Hepatitis C Screening  Completed   HIV Screening  Completed   HPV VACCINES  Aged Out    Health Maintenance  Health Maintenance Due  Topic Date Due   COVID-19 Vaccine (1) Never done   Pneumococcal Vaccine 042669Years old (2 - PCV) 08/18/2019    Colorectal cancer screening: Type of screening: Colonoscopy. Completed 10/08/20. Repeat every 1 years  Lung Cancer Screening: (Low Dose CT Chest recommended if Age 47-80years, 30 pack-year currently smoking OR have quit w/in 15years.) does not qualify.   Lung Cancer Screening Referral: no  Additional Screening:  Hepatitis C Screening: does not qualify; Completed 04/24/21  Vision Screening: Recommended annual ophthalmology exams for early detection of glaucoma and other disorders of the eye. Is the patient up to date with their annual eye exam?  No  Who is the provider or what is the name of the office in which the patient attends annual eye exams? Not completed If pt is not established with a provider, would they like to be referred to a provider to establish care? No .   Dental Screening: Recommended annual dental exams for proper oral hygiene  Community Resource Referral / Chronic Care Management: CRR required this visit?  No   CCM required this visit?  No      Plan:     I have personally reviewed and noted the following in the patient's chart:   Medical and social history Use of alcohol, tobacco or illicit drugs  Current medications and supplements including opioid prescriptions. Patient is not currently taking opioid prescriptions. Functional ability and status Nutritional status Physical activity Advanced directives List of other  physicians Hospitalizations, surgeries, and ER visits in previous 12 months Vitals Screenings to include cognitive, depression, and falls Referrals and appointments  In addition, I have reviewed and discussed with patient certain preventive protocols, quality metrics, and best practice recommendations. A written personalized  care plan for preventive services as well as general preventive health recommendations were provided to patient.    Marc [name] , Thank you for taking time to come for your Medicare Wellness Visit. I appreciate your ongoing commitment to your health goals. Please review the following plan we discussed and let me know if I can assist you in the future.   These are the goals we discussed:  Goals   None     This is a list of the screening recommended for you and due dates:  Health Maintenance  Topic Date Due   COVID-19 Vaccine (1) Never done   Pneumococcal Vaccination (2 - PCV) 08/18/2019   Tetanus Vaccine  04/24/2022*   Colon Cancer Screening  10/09/2021   Flu Shot  Completed   Hepatitis C Screening: USPSTF Recommendation to screen - Ages 18-79 yo.  Completed   HIV Screening  Completed   HPV Vaccine  Aged Out  *Topic was postponed. The date shown is not the original due date.     Jerelene Redden, [place]   04/28/2021   Nurse Notes: Non face to face 60 minutes

## 2021-04-28 NOTE — Progress Notes (Signed)
Hi Marc [name].  Your hepatitis C lab is also negative.  See you at our next visit.

## 2021-04-29 ENCOUNTER — Telehealth: Payer: Self-pay | Admitting: Family

## 2021-04-29 NOTE — Telephone Encounter (Signed)
Attempted to call back in attempt to get him a sooner appointment per Darylene Price, [place] and I got his voicemail so I told him I went ahead and rescheduled him with [place] for 9/21(next Wednesday) and if it didn't work for him to give [place] a call back to reschedule it as soon as possible.   Vandana Haman, NT

## 2021-05-07 ENCOUNTER — Ambulatory Visit: Payer: Medicare Other | Attending: Family | Admitting: Family

## 2021-05-07 ENCOUNTER — Other Ambulatory Visit: Payer: Self-pay

## 2021-05-07 ENCOUNTER — Encounter: Payer: Self-pay | Admitting: Family

## 2021-05-07 VITALS — BP 119/78 | HR 93 | Resp 18 | Ht 65.0 in | Wt 350.0 lb

## 2021-05-07 DIAGNOSIS — G4733 Obstructive sleep apnea (adult) (pediatric): Secondary | ICD-10-CM | POA: Diagnosis not present

## 2021-05-07 DIAGNOSIS — Z8249 Family history of ischemic heart disease and other diseases of the circulatory system: Secondary | ICD-10-CM | POA: Diagnosis not present

## 2021-05-07 DIAGNOSIS — F17210 Nicotine dependence, cigarettes, uncomplicated: Secondary | ICD-10-CM | POA: Insufficient documentation

## 2021-05-07 DIAGNOSIS — F32A Depression, unspecified: Secondary | ICD-10-CM

## 2021-05-07 DIAGNOSIS — R519 Headache, unspecified: Secondary | ICD-10-CM | POA: Diagnosis not present

## 2021-05-07 DIAGNOSIS — R079 Chest pain, unspecified: Secondary | ICD-10-CM | POA: Insufficient documentation

## 2021-05-07 DIAGNOSIS — Z7982 Long term (current) use of aspirin: Secondary | ICD-10-CM | POA: Insufficient documentation

## 2021-05-07 DIAGNOSIS — Z7984 Long term (current) use of oral hypoglycemic drugs: Secondary | ICD-10-CM | POA: Insufficient documentation

## 2021-05-07 DIAGNOSIS — Z720 Tobacco use: Secondary | ICD-10-CM | POA: Diagnosis not present

## 2021-05-07 DIAGNOSIS — R5383 Other fatigue: Secondary | ICD-10-CM | POA: Insufficient documentation

## 2021-05-07 DIAGNOSIS — G8929 Other chronic pain: Secondary | ICD-10-CM | POA: Diagnosis not present

## 2021-05-07 DIAGNOSIS — R059 Cough, unspecified: Secondary | ICD-10-CM | POA: Insufficient documentation

## 2021-05-07 DIAGNOSIS — F319 Bipolar disorder, unspecified: Secondary | ICD-10-CM | POA: Insufficient documentation

## 2021-05-07 DIAGNOSIS — Z79899 Other long term (current) drug therapy: Secondary | ICD-10-CM | POA: Diagnosis not present

## 2021-05-07 DIAGNOSIS — I10 Essential (primary) hypertension: Secondary | ICD-10-CM

## 2021-05-07 DIAGNOSIS — I110 Hypertensive heart disease with heart failure: Secondary | ICD-10-CM | POA: Diagnosis not present

## 2021-05-07 DIAGNOSIS — F419 Anxiety disorder, unspecified: Secondary | ICD-10-CM | POA: Diagnosis not present

## 2021-05-07 DIAGNOSIS — Z7901 Long term (current) use of anticoagulants: Secondary | ICD-10-CM | POA: Insufficient documentation

## 2021-05-07 DIAGNOSIS — Z9989 Dependence on other enabling machines and devices: Secondary | ICD-10-CM

## 2021-05-07 DIAGNOSIS — R0602 Shortness of breath: Secondary | ICD-10-CM | POA: Insufficient documentation

## 2021-05-07 DIAGNOSIS — Z888 Allergy status to other drugs, medicaments and biological substances status: Secondary | ICD-10-CM | POA: Diagnosis not present

## 2021-05-07 DIAGNOSIS — I5022 Chronic systolic (congestive) heart failure: Secondary | ICD-10-CM | POA: Diagnosis not present

## 2021-05-07 NOTE — Progress Notes (Signed)
Patient ID: Marc [name], male    DOB: Jun 19, 1976, 45 y.o.   MRN: [id number]   Mr Slinger is a 45 y/o male with a history of HTN, chronic pain, bipolar, current tobacco use and chronic heart failure.   Echo report from 10/10/2018 reviewed and showed an EF of 20-25% along with moderate MR/TR.   RHC/LHC done 09/19/20 and showed: Severely depressed overall left ventricular function EF less than 25% Severely enlarged left ventricular chamber Left main large free of disease LAD was large and free of disease Circumflex was large and free of disease left dominant Right heart cath showed no evidence of pulmonary hypertension Mean PA was 23 mean wedge of 10 Cardiac output of 7.6 Fick  Has not been admitted or been in the ED in the last 6 months.   He presents today for a follow-up visit with a chief complaint of moderate fatigue upon minimal exertion. He has associated decrease appetite, cough, shortness of breath, intermittent chest pain, pedal edema, anxiety, depression & headaches. He denies any dizziness, difficulty sleeping, abdominal distention, palpitations or weight gain.   Feels like he may be having a gout flare in his left foot. Denies any change in his diet.   Past Medical History:  Diagnosis Date   Bipolar affective ([place])    CHF (congestive heart failure) ([place])    Hypertension    Past Surgical History:  Procedure Laterality Date   BICEPS TENDON REPAIR     COLONOSCOPY WITH PROPOFOL N/A 10/09/2020   Procedure: COLONOSCOPY WITH PROPOFOL;  Surgeon: Lin Landsman, MD;  Location: [facility] ENDOSCOPY;  Service: Gastroenterology;  Laterality: N/A;   FLEXIBLE SIGMOIDOSCOPY N/A 10/30/2020   Procedure: FLEXIBLE SIGMOIDOSCOPY;  Surgeon: Lin Landsman, MD;  Location: [facility] ENDOSCOPY;  Service: Gastroenterology;  Laterality: N/A;   FOOT SURGERY     metataral and fasciotomy   RIGHT/LEFT HEART CATH AND CORONARY ANGIOGRAPHY Bilateral 09/19/2020   Procedure: RIGHT/LEFT HEART CATH AND  CORONARY ANGIOGRAPHY;  Surgeon: Yolonda Kida, MD;  Location: [place] [facility] LAB;  Service: Cardiovascular;  Laterality: Bilateral;   ROTATOR CUFF REPAIR     Family History  Problem Relation Age of Onset   Stroke Mother    Dementia Mother    Hypertension Father    Diabetes Father    Hypertension Paternal Grandfather    Diabetes Paternal Grandfather    Social History   Tobacco Use   Smoking status: Every Day    Packs/day: 0.50    Years: 25.00    Pack years: 12.50    Types: Cigarettes   Smokeless tobacco: Never  Substance Use Topics   Alcohol use: Not Currently   Allergies  Allergen Reactions   Geodon [Ziprasidone] Other (See Comments)    paralysis   Tramadol Other (See Comments)    Pt stated that it gave him sores.   Amoxicillin Rash   Zithromax [Azithromycin] Rash   Prior to Admission medications   Medication Sig Start Date End Date Taking? Authorizing Provider  albuterol (VENTOLIN HFA) 108 (90 Base) MCG/ACT inhaler Inhale 2 puffs into the lungs every 6 (six) hours as needed for wheezing or shortness of breath. 12/19/20  Yes Jon Billings, NP  allopurinol (ZYLOPRIM) 100 MG tablet Take 1 tablet (100 mg total) by mouth daily. 12/19/20  Yes Jon Billings, NP  aspirin EC 81 MG tablet Take 81 mg by mouth daily.   Yes [provider]  carvedilol (COREG) 6.25 MG tablet Take 1 tablet (6.25 mg total) by  mouth 2 (two) times daily. 08/12/20  Yes Kang Ishida, Otila Kluver A, FNP  cyclobenzaprine (FLEXERIL) 5 MG tablet Take 10 mg by mouth 3 (three) times daily as needed for muscle spasms.   Yes [provider]  dapagliflozin propanediol (FARXIGA) 10 MG TABS tablet Take 1 tablet (10 mg total) by mouth daily before breakfast. 01/23/21  Yes Jozelynn Danielson A, FNP  lidocaine (LIDODERM) 5 % Place 1 patch onto the skin daily as needed (pain). Remove & Discard patch within 12 hours or as directed by MD   Yes [provider]  sacubitril-valsartan (ENTRESTO) 97-103 MG Take 1  tablet by mouth 2 (two) times daily. 11/10/19  Yes Darylene Price A, FNP  sildenafil (VIAGRA) 100 MG tablet Take 0.5-1 tablets (50-100 mg total) by mouth daily as needed for erectile dysfunction. 04/24/21  Yes Jon Billings, NP  spironolactone (ALDACTONE) 25 MG tablet Take 1 tablet (25 mg total) by mouth daily. 12/31/20  Yes Haseeb Fiallos, Otila Kluver A, FNP  torsemide (DEMADEX) 20 MG tablet Take 40 mg by mouth 2 (two) times daily.   Yes [provider]  vitamin C (ASCORBIC ACID) 500 MG tablet Take 500 mg by mouth daily.   Yes [provider]   Review of Systems  Constitutional:  Positive for appetite change (decreased) and fatigue (tired "all the time").  HENT:  Negative for congestion, postnasal drip and sore throat.   Eyes: Negative.   Respiratory:  Positive for cough (at times) and shortness of breath ("improving").   Cardiovascular:  Positive for chest pain (at times) and leg swelling (left leg). Negative for palpitations.  Gastrointestinal:  Negative for abdominal distention and abdominal pain.  Endocrine: Negative.   Genitourinary: Negative.   Musculoskeletal:  Positive for arthralgias (legs), back pain and neck pain.  Skin:  Positive for wound (right shin).  Allergic/Immunologic: Negative.   Neurological:  Positive for weakness (legs) and headaches. Negative for dizziness and light-headedness.  Hematological:  Negative for adenopathy. Does not bruise/bleed easily.  Psychiatric/Behavioral:  Positive for dysphoric mood. Negative for sleep disturbance (wearing CPAP). The patient is nervous/anxious.    Vitals:   05/07/21 1523  BP: 119/78  Pulse: 93  Resp: 18  SpO2: 98%  Weight: (!) 350 lb (158.8 kg)  Height: 5\' 5"  (1.651 m)   Wt Readings from Last 3 Encounters:  05/07/21 (!) 350 lb (158.8 kg)  04/24/21 (!) 360 lb 9.6 oz (163.6 kg)  03/21/21 (!) 358 lb (162.4 kg)   Lab Results  Component Value Date   CREATININE 0.86 04/24/2021   CREATININE 0.85 02/19/2021   CREATININE  0.87 12/19/2020    Physical Exam Vitals and nursing note reviewed.  HENT:     Head: Normocephalic and atraumatic.  Cardiovascular:     Rate and Rhythm: Normal rate and regular rhythm.  Pulmonary:     Effort: Pulmonary effort is normal.     Breath sounds: No wheezing or rales.     Comments: Diminished due to chest wall size Abdominal:     General: There is no distension.     Palpations: Abdomen is soft.     Tenderness: There is no abdominal tenderness.  Musculoskeletal:        General: No tenderness.     Cervical back: Normal range of motion and neck supple.     Right lower leg: No tenderness. No edema.     Left lower leg: No tenderness. No edema.  Skin:    General: Skin is warm and dry.  Neurological:  General: No focal deficit present.     Mental Status: He is alert and oriented to person, place, and time.  Psychiatric:        Attention and Perception: Attention normal.        Mood and Affect: Mood is anxious. Mood is not depressed.        Behavior: Behavior normal.    Assessment & Plan:  1. Chronic heart failure with reduced ejection fraction- - NYHA class III - euvolemic today - weighing daily; reminded to call for an overnight weight gain of >2 pounds or a weekly weight gain of >5 pounds - weight up 2 pounds from last visit here 2.5 months ago - admits to not being very active because of his sciatica pain and now he's not sure if he's having a gout flare (advised to message his PCP regarding this) - not adding salt to his food and has been reading food labels - saw cardiology Dema Severin) 10/01/20 - on GDMT of carvedilol, entresto, farxiga & spironolactone - BNP 10/19/19 was 101.0  2: HTN- - BP looks good today (118/78) - saw PCP Mathis Dad) 04/24/21 - BMP from 04/24/21 reviewed and showed sodium 139, potassium 4.2, creatinine 0.86 and GFR 108  3: Anxiety/ depression- - patient says that previously he's talked to the therapist at [name] - does feel like this is  more under control  4: Tobacco use- - smoking ~ 1/2 ppd of cigarettes - denies alcohol or drug use - complete cessation discussed for 3 minutes with the patient.   5: Obstructive sleep apnea- - saw pulmonology Humphrey Rolls) 11/18/20 - wearing his CPAP nightly   Medication bottles reviewed.   Return in 3 months or sooner for any questions/problems before then.

## 2021-05-07 NOTE — Patient Instructions (Addendum)
Continue weighing daily and call for an overnight weight gain of > 2 pounds or a weekly weight gain of >5 pounds.     Call your primary care doctor or send them a mychart message regarding your left pain.

## 2021-05-08 ENCOUNTER — Ambulatory Visit: Payer: Self-pay | Admitting: *Deleted

## 2021-05-08 DIAGNOSIS — I509 Heart failure, unspecified: Secondary | ICD-10-CM | POA: Diagnosis not present

## 2021-05-08 DIAGNOSIS — F17200 Nicotine dependence, unspecified, uncomplicated: Secondary | ICD-10-CM | POA: Diagnosis not present

## 2021-05-08 DIAGNOSIS — S99912A Unspecified injury of left ankle, initial encounter: Secondary | ICD-10-CM | POA: Diagnosis not present

## 2021-05-08 DIAGNOSIS — Z888 Allergy status to other drugs, medicaments and biological substances status: Secondary | ICD-10-CM | POA: Diagnosis not present

## 2021-05-08 DIAGNOSIS — M10072 Idiopathic gout, left ankle and foot: Secondary | ICD-10-CM | POA: Diagnosis not present

## 2021-05-08 DIAGNOSIS — M19072 Primary osteoarthritis, left ankle and foot: Secondary | ICD-10-CM | POA: Diagnosis not present

## 2021-05-08 DIAGNOSIS — M25572 Pain in left ankle and joints of left foot: Secondary | ICD-10-CM | POA: Diagnosis not present

## 2021-05-08 DIAGNOSIS — Z880 Allergy status to penicillin: Secondary | ICD-10-CM | POA: Diagnosis not present

## 2021-05-08 DIAGNOSIS — I110 Hypertensive heart disease with heart failure: Secondary | ICD-10-CM | POA: Diagnosis not present

## 2021-05-08 DIAGNOSIS — M109 Gout, unspecified: Secondary | ICD-10-CM | POA: Diagnosis not present

## 2021-05-08 DIAGNOSIS — Z79899 Other long term (current) drug therapy: Secondary | ICD-10-CM | POA: Diagnosis not present

## 2021-05-08 NOTE — Telephone Encounter (Signed)
Reason for Disposition  [1] Redness of the skin AND [2] no fever    Heel red and ankle swollen top of foot hurting.   Has gout  Answer Assessment - Initial Assessment Questions 1. ONSET: "When did the pain start?"      My left foot and hip is hurting.   I have gout.   I've taken my gout medicine and pain medication and nothing is touching this pain.  It's hard to walk. 2 days ago  2. LOCATION: "Where is the pain located?"      Left foot top of my foot is swollen and my heel is red and my ankle is swollen.   3. PAIN: "How bad is the pain?"    (Scale 1-10; or mild, moderate, severe)  - MILD (1-3): doesn't interfere with normal activities.   - MODERATE (4-7): interferes with normal activities (e.g., work or school) or awakens from sleep, limping.   - SEVERE (8-10): excruciating pain, unable to do any normal activities, unable to walk.      Severe   This morning I can't walk.   I've taken Aleve, ibuprofen and Tylenol along with my gout medicine and nothing is touching this pain. 4. WORK OR EXERCISE: "Has there been any recent work or exercise that involved this part of the body?"      No I'm sure it's my gout. 5. CAUSE: "What do you think is causing the foot pain?"     gout 6. OTHER SYMPTOMS: "Do you have any other symptoms?" (e.g., leg pain, rash, fever, numbness)     Ankle is swollen and heel is red, top of foot is swollen too 7. PREGNANCY: "Is there any chance you are pregnant?" "When was your last menstrual period?"     N/A  Protocols used: Foot Pain-A-AH

## 2021-05-08 NOTE — Telephone Encounter (Signed)
Pt called in c/o severe pain in his left foot with swelling in his ankle and on the top of his foot.   His heel is also red.   He has gout and has tried his gout medication and Aleve and ibuprofen but nothing is touching the pain in his foot.   He is having some pain in his left hip also but mostly his left foot is what is really hurting.   He can't walk on it this morning. The phone line disconnected 3 times during our conversation.   Not sure if it's on his end or mine.   On the 3rd disconnect I got his voicemail so I left a message to call back and let the agent know he needed an appt and that he did not need to talk with a nurse again.    I also sent my notes to [facility].

## 2021-05-09 NOTE — Telephone Encounter (Signed)
Please contact patient and schedule an appointment to discuss his foot pain.

## 2021-05-12 ENCOUNTER — Telehealth: Payer: Self-pay | Admitting: Nurse Practitioner

## 2021-05-12 NOTE — Telephone Encounter (Signed)
LVM for pt to call the office to schedule AWV-I with [facility]. Please schedule this appt if pt calls the office.  Thanks

## 2021-05-12 NOTE — Telephone Encounter (Signed)
Lmom for patient to get scheduled for appointment for foot pain.

## 2021-05-20 ENCOUNTER — Ambulatory Visit: Payer: Medicare Other | Admitting: Family

## 2021-05-20 DIAGNOSIS — G4733 Obstructive sleep apnea (adult) (pediatric): Secondary | ICD-10-CM | POA: Diagnosis not present

## 2021-06-20 DIAGNOSIS — G4733 Obstructive sleep apnea (adult) (pediatric): Secondary | ICD-10-CM | POA: Diagnosis not present

## 2021-07-20 DIAGNOSIS — G4733 Obstructive sleep apnea (adult) (pediatric): Secondary | ICD-10-CM | POA: Diagnosis not present

## 2021-07-23 NOTE — Progress Notes (Addendum)
BP 114/76   Pulse 76   Temp 98.6 F (37 C) (Oral)   Ht $R'5\' 8"'KJ$  (1.727 m)   Wt (!) 351 lb 12.8 oz (159.6 kg)   SpO2 99%   BMI 53.49 kg/m    Subjective:    Patient ID: Marc [name], male    DOB: 1976-02-22, 45 y.o.   MRN: [id number]  HPI: Marc [name] is a 45 y.o. male  Chief Complaint  Patient presents with   Diabetes   Hyperlipidemia   Hypertension   Leg Pain    Pt states his L leg and foot have been bothering him. States it hurts mainly at night, thinks it may be gout. Patient rating pain at an 8 today.    Eye Problem    Pt states his L eye is painful and swollen again, states he thinks it has infection in it again    HYPERTENSION/HEART FAILURE Hypertension status: controlled  Satisfied with current treatment? no Duration of hypertension: years BP monitoring frequency:  not checking BP range:  BP medication side effects:  no Medication compliance: excellent compliance Previous BP meds: and Entresto, carvedilol, and spironalactone Aspirin: yes Recurrent headaches: no Visual changes: no Palpitations: no Dyspnea: no Chest pain: no Lower extremity edema: no Dizzy/lightheaded: no  FOOT PAIN Patient states his left foot has really been bothering him. Feels like he is stepping on needles.  He has a history of Gout and he is taking Allopurinol daily.  Patient states the pain has been going on for a couple of months.  But the pain worsened a week ago.    EYE PAIN Patient states his eye pain in the left eye.  States he keeps getting an infection.  States he has had this 3 times.  He tried switching out his cpap mask but that hasn't helped.   Relevant past medical, surgical, family and social history reviewed and updated as indicated. Interim medical history since our last visit reviewed. Allergies and medications reviewed and updated.  Review of Systems  Eyes:  Positive for pain and discharge. Negative for visual disturbance.       Swollen eyelid  Respiratory:   Negative for shortness of breath.   Cardiovascular:  Negative for chest pain and leg swelling.  Musculoskeletal:        Foot pain  Neurological:  Negative for light-headedness and headaches.   Per HPI unless specifically indicated above     Objective:    BP 114/76   Pulse 76   Temp 98.6 F (37 C) (Oral)   Ht $R'5\' 8"'Zu$  (1.727 m)   Wt (!) 351 lb 12.8 oz (159.6 kg)   SpO2 99%   BMI 53.49 kg/m   Wt Readings from Last 3 Encounters:  07/24/21 (!) 351 lb 12.8 oz (159.6 kg)  05/07/21 (!) 350 lb (158.8 kg)  04/24/21 (!) 360 lb 9.6 oz (163.6 kg)    Physical Exam Vitals and nursing note reviewed.  Constitutional:      General: He is not in acute distress.    Appearance: Normal appearance. He is obese. He is not ill-appearing, toxic-appearing or diaphoretic.  HENT:     Head: Normocephalic.     Right Ear: External ear normal.     Left Ear: External ear normal.     Nose: Nose normal. No congestion or rhinorrhea.     Mouth/Throat:     Mouth: Mucous membranes are moist.  Eyes:     General:  Right eye: No discharge.        Left eye: No discharge.     Extraocular Movements: Extraocular movements intact.     Conjunctiva/sclera: Conjunctivae normal.     Pupils: Pupils are equal, round, and reactive to light.   Cardiovascular:     Rate and Rhythm: Normal rate and regular rhythm.     Heart sounds: No murmur heard. Pulmonary:     Effort: Pulmonary effort is normal. No respiratory distress.     Breath sounds: Normal breath sounds. No wheezing, rhonchi or rales.  Abdominal:     General: Abdomen is flat. Bowel sounds are normal.  Musculoskeletal:     Cervical back: Normal range of motion and neck supple.  Feet:     Comments: Pain, swelling and warmth of left great toe Skin:    General: Skin is warm and dry.     Capillary Refill: Capillary refill takes less than 2 seconds.  Neurological:     General: No focal deficit present.     Mental Status: He is alert and oriented to  person, place, and time.  Psychiatric:        Mood and Affect: Mood normal.        Behavior: Behavior normal.        Thought Content: Thought content normal.        Judgment: Judgment normal.    Results for orders placed or performed in visit on 04/24/21  Microscopic Examination   Urine  Result Value Ref Range   WBC, UA None seen 0 - 5 /hpf   RBC 0-2 0 - 2 /hpf   Epithelial Cells (non renal) 0-10 0 - 10 /hpf   Bacteria, UA None seen None seen/Few  TSH  Result Value Ref Range   TSH 1.490 0.450 - 4.500 uIU/mL  PSA  Result Value Ref Range   Prostate Specific Ag, Serum <0.1 0.0 - 4.0 ng/mL  Lipid panel  Result Value Ref Range   Cholesterol, Total 184 100 - 199 mg/dL   Triglycerides 179 (H) 0 - 149 mg/dL   HDL 51 >39 mg/dL   VLDL Cholesterol Cal 31 5 - 40 mg/dL   LDL Chol Calc (NIH) 102 (H) 0 - 99 mg/dL   Chol/HDL Ratio 3.6 0.0 - 5.0 ratio  CBC with Differential/Platelet  Result Value Ref Range   WBC 8.2 3.4 - 10.8 x10E3/uL   RBC 5.43 4.14 - 5.80 x10E6/uL   Hemoglobin 12.6 (L) 13.0 - 17.7 g/dL   Hematocrit 40.5 37.5 - 51.0 %   MCV 75 (L) 79 - 97 fL   MCH 23.2 (L) 26.6 - 33.0 pg   MCHC 31.1 (L) 31.5 - 35.7 g/dL   RDW 16.4 (H) 11.6 - 15.4 %   Platelets 278 150 - 450 x10E3/uL   Neutrophils 60 Not Estab. %   Lymphs 29 Not Estab. %   Monocytes 7 Not Estab. %   Eos 2 Not Estab. %   Basos 1 Not Estab. %   Neutrophils Absolute 5.1 1.4 - 7.0 x10E3/uL   Lymphocytes Absolute 2.4 0.7 - 3.1 x10E3/uL   Monocytes Absolute 0.6 0.1 - 0.9 x10E3/uL   EOS (ABSOLUTE) 0.2 0.0 - 0.4 x10E3/uL   Basophils Absolute 0.1 0.0 - 0.2 x10E3/uL   Immature Granulocytes 1 Not Estab. %   Immature Grans (Abs) 0.0 0.0 - 0.1 x10E3/uL  Comprehensive metabolic panel  Result Value Ref Range   Glucose 82 65 - 99 mg/dL   BUN 9 6 -  24 mg/dL   Creatinine, Ser 0.86 0.76 - 1.27 mg/dL   eGFR 108 >59 mL/min/1.73   BUN/Creatinine Ratio 10 9 - 20   Sodium 139 134 - 144 mmol/L   Potassium 4.2 3.5 - 5.2 mmol/L    Chloride 99 96 - 106 mmol/L   CO2 23 20 - 29 mmol/L   Calcium 8.9 8.7 - 10.2 mg/dL   Total Protein 7.3 6.0 - 8.5 g/dL   Albumin 3.9 (L) 4.0 - 5.0 g/dL   Globulin, Total 3.4 1.5 - 4.5 g/dL   Albumin/Globulin Ratio 1.1 (L) 1.2 - 2.2   Bilirubin Total <0.2 0.0 - 1.2 mg/dL   Alkaline Phosphatase 118 44 - 121 IU/L   AST 19 0 - 40 IU/L   ALT 11 0 - 44 IU/L  Urinalysis, Routine w reflex microscopic  Result Value Ref Range   Specific Gravity, UA 1.015 1.005 - 1.030   pH, UA 7.5 5.0 - 7.5   Color, UA Yellow Yellow   Appearance Ur Clear Clear   Leukocytes,UA Negative Negative   Protein,UA 1+ (A) Negative/Trace   Glucose, UA 3+ (A) Negative   Ketones, UA Negative Negative   RBC, UA Trace (A) Negative   Bilirubin, UA Negative Negative   Urobilinogen, Ur 0.2 0.2 - 1.0 mg/dL   Nitrite, UA Negative Negative   Microscopic Examination See below:   Hepatitis C Antibody  Result Value Ref Range   Hep C Virus Ab <0.1 0.0 - 0.9 s/co ratio      Assessment & Plan:   Problem List Items Addressed This Visit       Cardiovascular and Mediastinum   Congestive heart failure (HCC) - Primary    Chronic. Followed by HF clinic. Continue to weight daily.  Call if weight gain of 3lbs over night.  Continue with Torsemide. Follow up in 3 months.       Relevant Orders   Comp Met (CMET)   Hypertension    Chronic.  Controlled.  Continue with current medication regimen of entresto, Carvdilol and Spirnolactone.  Followed by HF clinic. Labs ordered today.  Return to clinic in 6 months for reevaluation.  Call sooner if concerns arise.        Aortic atherosclerosis (HCC)    Chronic.  Controlled.  Continue with current medication regimen of ASA. Labs ordered today.  Return to clinic in 6 months for reevaluation.  Call sooner if concerns arise.          Other   Morbid obesity ([place])    Recommend a diet low in fat.  Recommend smaller portions that prioritize protein.       Relevant Orders   Comp Met (CMET)    Gout    On Allopurinol daily.  Feels like it may be making pain worse. Will continue today and consider stopping allopurinol at follow up in 2 weeks. Will give Kenalog and Toradol in office today.  Will start prednisone tomorrow to help with Gout flare.  Will follow up in 2 weeks.  Pain may be complicated with Neuropathy.  Will consider starting Gabapentin at two week follow up if patient continues with feeling of needles in feet persists.       Relevant Orders   Uric acid   Other Visit Diagnoses     Infection of left eye       Bactrim sent for patient during visit to treat infection. Follow up if symptoms worsen or fail to improve.    Relevant Medications  sulfamethoxazole-trimethoprim (BACTRIM DS) 800-160 MG tablet        Follow up plan: Return in about 2 weeks (around 08/07/2021) for Gout/neuropathy.

## 2021-07-24 ENCOUNTER — Encounter: Payer: Self-pay | Admitting: Nurse Practitioner

## 2021-07-24 ENCOUNTER — Ambulatory Visit (INDEPENDENT_AMBULATORY_CARE_PROVIDER_SITE_OTHER): Payer: Medicare Other | Admitting: Nurse Practitioner

## 2021-07-24 ENCOUNTER — Other Ambulatory Visit: Payer: Self-pay

## 2021-07-24 VITALS — BP 114/76 | HR 76 | Temp 98.6°F | Ht 68.0 in | Wt 351.8 lb

## 2021-07-24 DIAGNOSIS — M109 Gout, unspecified: Secondary | ICD-10-CM | POA: Diagnosis not present

## 2021-07-24 DIAGNOSIS — H44002 Unspecified purulent endophthalmitis, left eye: Secondary | ICD-10-CM

## 2021-07-24 DIAGNOSIS — I10 Essential (primary) hypertension: Secondary | ICD-10-CM | POA: Diagnosis not present

## 2021-07-24 DIAGNOSIS — I509 Heart failure, unspecified: Secondary | ICD-10-CM

## 2021-07-24 DIAGNOSIS — I700 Atherosclerosis of aorta: Secondary | ICD-10-CM | POA: Diagnosis not present

## 2021-07-24 MED ORDER — SULFAMETHOXAZOLE-TRIMETHOPRIM 800-160 MG PO TABS: 1.0000 | ORAL_TABLET | Freq: Two times a day (BID) | ORAL | 0 refills | Status: DC

## 2021-07-24 MED ORDER — PREDNISONE 10 MG PO TABS: 10.0000 mg | ORAL_TABLET | Freq: Every day | ORAL | 0 refills | Status: DC

## 2021-07-24 MED ORDER — TRIAMCINOLONE ACETONIDE 40 MG/ML IJ SUSP
40.0000 mg | Freq: Once | INTRAMUSCULAR | Status: AC
  Administered 2021-07-24: 40 mg via INTRAMUSCULAR

## 2021-07-24 MED ORDER — KETOROLAC TROMETHAMINE 60 MG/2ML IM SOLN
60.0000 mg | Freq: Once | INTRAMUSCULAR | Status: AC
  Administered 2021-07-24: 60 mg via INTRAMUSCULAR

## 2021-07-24 NOTE — Assessment & Plan Note (Signed)
Chronic. Followed by HF clinic. Continue to weight daily.  Call if weight gain of 3lbs over night.  Continue with Torsemide. Follow up in 3 months.

## 2021-07-24 NOTE — Assessment & Plan Note (Signed)
Chronic.  Controlled.  Continue with current medication regimen of entresto, Carvdilol and Spirnolactone.  Followed by HF clinic. Labs ordered today.  Return to clinic in 6 months for reevaluation.  Call sooner if concerns arise.

## 2021-07-24 NOTE — Assessment & Plan Note (Signed)
Chronic.  Controlled.  Continue with current medication regimen of ASA. Labs ordered today.  Return to clinic in 6 months for reevaluation.  Call sooner if concerns arise.   

## 2021-07-24 NOTE — Assessment & Plan Note (Signed)
On Allopurinol daily.  Feels like it may be making pain worse. Will continue today and consider stopping allopurinol at follow up in 2 weeks. Will give Kenalog and Toradol in office today.  Will start prednisone tomorrow to help with Gout flare.  Will follow up in 2 weeks.  Pain may be complicated with Neuropathy.  Will consider starting Gabapentin at two week follow up if patient continues with feeling of needles in feet persists.

## 2021-07-24 NOTE — Assessment & Plan Note (Signed)
Recommend a diet low in fat.  Recommend smaller portions that prioritize protein.

## 2021-07-25 LAB — COMPREHENSIVE METABOLIC PANEL
ALT: 12 IU/L (ref 0–44)
AST: 23 IU/L (ref 0–40)
Albumin/Globulin Ratio: 1.2 (ref 1.2–2.2)
Albumin: 4.1 g/dL (ref 4.0–5.0)
Alkaline Phosphatase: 129 IU/L — ABNORMAL HIGH (ref 44–121)
BUN/Creatinine Ratio: 10 (ref 9–20)
BUN: 10 mg/dL (ref 6–24)
Bilirubin Total: <0.2 mg/dL (ref 0.0–1.2)
CO2: 22 mmol/L (ref 20–29)
Calcium: 9.3 mg/dL (ref 8.7–10.2)
Chloride: 99 mmol/L (ref 96–106)
Creatinine, Ser: 0.97 mg/dL (ref 0.76–1.27)
Globulin, Total: 3.4 g/dL (ref 1.5–4.5)
Glucose: 88 mg/dL (ref 70–99)
Potassium: 4.3 mmol/L (ref 3.5–5.2)
Sodium: 138 mmol/L (ref 134–144)
Total Protein: 7.5 g/dL (ref 6.0–8.5)
eGFR: 97 mL/min/{1.73_m2} (ref >59)

## 2021-07-25 LAB — URIC ACID: Uric Acid: 7 mg/dL (ref 3.8–8.4)

## 2021-07-25 NOTE — Progress Notes (Signed)
Please let patient know that his lab work looks good.  No concerns at this time. We will continue monitoring in the future.

## 2021-08-06 ENCOUNTER — Ambulatory Visit: Payer: Medicare Other | Admitting: Family

## 2021-08-06 ENCOUNTER — Telehealth: Payer: Self-pay | Admitting: Family

## 2021-08-06 NOTE — Telephone Encounter (Signed)
Patient did not show for his [facility] Failure Clinic appointment on 08/06/21. Will attempt to reschedule.

## 2021-08-06 NOTE — Progress Notes (Deleted)
There were no vitals taken for this visit.   Subjective:    Patient ID: Marc [name], male    DOB: [birth year], 45 y.o.   MRN: [id number]  HPI: Marc [name] is a 45 y.o. male  No chief complaint on file.  HYPERTENSION/HEART FAILURE Hypertension status: controlled  Satisfied with current treatment? no Duration of hypertension: years BP monitoring frequency:  not checking BP range:  BP medication side effects:  no Medication compliance: excellent compliance Previous BP meds: and Entresto, carvedilol, and spironalactone Aspirin: yes Recurrent headaches: no Visual changes: no Palpitations: no Dyspnea: no Chest pain: no Lower extremity edema: no Dizzy/lightheaded: no  FOOT PAIN Patient states his left foot has really been bothering him. Feels like he is stepping on needles.  He has a history of Gout and he is taking Allopurinol daily.  Patient states the pain has been going on for a couple of months.  But the pain worsened a week ago.    EYE PAIN Patient states his eye pain in the left eye.  States he keeps getting an infection.  States he has had this 3 times.  He tried switching out his cpap mask but that hasn't helped.   Relevant past medical, surgical, family and social history reviewed and updated as indicated. Interim medical history since our last visit reviewed. Allergies and medications reviewed and updated.  Review of Systems  Eyes:  Negative for visual disturbance.       Swollen eyelid  Respiratory:  Negative for shortness of breath.   Cardiovascular:  Negative for chest pain and leg swelling.  Musculoskeletal:        Foot pain  Neurological:  Negative for light-headedness and headaches.   Per HPI unless specifically indicated above     Objective:    There were no vitals taken for this visit.  Wt Readings from Last 3 Encounters:  07/24/21 (!) 351 lb 12.8 oz (159.6 kg)  05/07/21 (!) 350 lb (158.8 kg)  04/24/21 (!) 360 lb 9.6 oz (163.6 kg)     Physical Exam Vitals and nursing note reviewed.  Constitutional:      General: He is not in acute distress.    Appearance: Normal appearance. He is obese. He is not ill-appearing, toxic-appearing or diaphoretic.  HENT:     Head: Normocephalic.     Right Ear: External ear normal.     Left Ear: External ear normal.     Nose: Nose normal. No congestion or rhinorrhea.     Mouth/Throat:     Mouth: Mucous membranes are moist.  Eyes:     General:        Right eye: No discharge.        Left eye: No discharge.     Extraocular Movements: Extraocular movements intact.     Conjunctiva/sclera: Conjunctivae normal.     Pupils: Pupils are equal, round, and reactive to light.   Cardiovascular:     Rate and Rhythm: Normal rate and regular rhythm.     Heart sounds: No murmur heard. Pulmonary:     Effort: Pulmonary effort is normal. No respiratory distress.     Breath sounds: Normal breath sounds. No wheezing, rhonchi or rales.  Abdominal:     General: Abdomen is flat. Bowel sounds are normal.  Musculoskeletal:     Cervical back: Normal range of motion and neck supple.  Feet:     Comments: Pain, swelling and warmth of left great toe Skin:  General: Skin is warm and dry.     Capillary Refill: Capillary refill takes less than 2 seconds.  Neurological:     General: No focal deficit present.     Mental Status: He is alert and oriented to person, place, and time.  Psychiatric:        Mood and Affect: Mood normal.        Behavior: Behavior normal.        Thought Content: Thought content normal.        Judgment: Judgment normal.    Results for orders placed or performed in visit on 07/24/21  Comp Met (CMET)  Result Value Ref Range   Glucose 88 70 - 99 mg/dL   BUN 10 6 - 24 mg/dL   Creatinine, Ser 0.97 0.76 - 1.27 mg/dL   eGFR 97 >59 mL/min/1.73   BUN/Creatinine Ratio 10 9 - 20   Sodium 138 134 - 144 mmol/L   Potassium 4.3 3.5 - 5.2 mmol/L   Chloride 99 96 - 106 mmol/L   CO2 22 20 -  29 mmol/L   Calcium 9.3 8.7 - 10.2 mg/dL   Total Protein 7.5 6.0 - 8.5 g/dL   Albumin 4.1 4.0 - 5.0 g/dL   Globulin, Total 3.4 1.5 - 4.5 g/dL   Albumin/Globulin Ratio 1.2 1.2 - 2.2   Bilirubin Total <0.2 0.0 - 1.2 mg/dL   Alkaline Phosphatase 129 (H) 44 - 121 IU/L   AST 23 0 - 40 IU/L   ALT 12 0 - 44 IU/L  Uric acid  Result Value Ref Range   Uric Acid 7.0 3.8 - 8.4 mg/dL      Assessment & Plan:   Problem List Items Addressed This Visit   None    Follow up plan: No follow-ups on file.

## 2021-08-07 ENCOUNTER — Ambulatory Visit: Payer: Medicare Other | Admitting: Nurse Practitioner

## 2021-08-20 DIAGNOSIS — G4733 Obstructive sleep apnea (adult) (pediatric): Secondary | ICD-10-CM | POA: Diagnosis not present

## 2021-09-20 DIAGNOSIS — G4733 Obstructive sleep apnea (adult) (pediatric): Secondary | ICD-10-CM | POA: Diagnosis not present

## 2021-09-24 DIAGNOSIS — G4733 Obstructive sleep apnea (adult) (pediatric): Secondary | ICD-10-CM | POA: Diagnosis not present

## 2021-09-30 ENCOUNTER — Telehealth: Payer: Self-pay | Admitting: Nurse Practitioner

## 2021-09-30 NOTE — Telephone Encounter (Signed)
Copied from [place] [id number]. Topic: Medicare AWV >> Sep 30, 2021  2:06 PM Lavonia Drafts wrote: Reason for CRM:  Left message for patient to call back and schedule Medicare Annual Wellness Visit (AWV) to be done virtually or by telephone.  No hx of AWV eligible as of 04/17/21  Please schedule at anytime with [facility]Nurse Health Advisor.      31 Minutes appointment   Any questions, please call me at [phone]

## 2021-10-09 ENCOUNTER — Emergency Department: Payer: Medicare Other

## 2021-10-09 ENCOUNTER — Other Ambulatory Visit: Payer: Self-pay

## 2021-10-09 ENCOUNTER — Encounter: Payer: Self-pay | Admitting: Emergency Medicine

## 2021-10-09 ENCOUNTER — Emergency Department
Admission: EM | Admit: 2021-10-09 | Discharge: 2021-10-09 | Disposition: A | Discharge status: Home / Self Care | Payer: Medicare Other | Attending: Emergency Medicine | Admitting: Emergency Medicine

## 2021-10-09 DIAGNOSIS — S46912A Strain of unspecified muscle, fascia and tendon at shoulder and upper arm level, left arm, initial encounter: Secondary | ICD-10-CM | POA: Diagnosis not present

## 2021-10-09 DIAGNOSIS — S4992XA Unspecified injury of left shoulder and upper arm, initial encounter: Secondary | ICD-10-CM | POA: Diagnosis not present

## 2021-10-09 DIAGNOSIS — I110 Hypertensive heart disease with heart failure: Secondary | ICD-10-CM | POA: Diagnosis not present

## 2021-10-09 DIAGNOSIS — I509 Heart failure, unspecified: Secondary | ICD-10-CM | POA: Diagnosis not present

## 2021-10-09 DIAGNOSIS — X500XXA Overexertion from strenuous movement or load, initial encounter: Secondary | ICD-10-CM | POA: Insufficient documentation

## 2021-10-09 MED ORDER — KETOROLAC TROMETHAMINE 60 MG/2ML IM SOLN
60.0000 mg | Freq: Once | INTRAMUSCULAR | Status: AC
  Administered 2021-10-09: 60 mg via INTRAMUSCULAR
  Filled 2021-10-09: qty 2

## 2021-10-09 MED ORDER — CYCLOBENZAPRINE HCL 5 MG PO TABS: 10.0000 mg | ORAL_TABLET | Freq: Three times a day (TID) | ORAL | 0 refills | Status: AC | PRN

## 2021-10-09 MED ORDER — PREDNISONE 10 MG PO TABS: 10.0000 mg | ORAL_TABLET | Freq: Every day | ORAL | 0 refills | Status: DC

## 2021-10-09 NOTE — Discharge Instructions (Signed)
Your exam and XR are reassuring. No evidence of fracture or dislocation. Your exam consistent with a shoulder strain. Take the prescription meds as directed. Follow-up with  your provider or Ortho as needed.

## 2021-10-09 NOTE — ED Triage Notes (Signed)
Pt here with left shoulder pain this morning. Pt states he was moving a tore and gelt something "tear" and he has not been able to lift up his arm since. Pt is still able to move his fingers. Pt in NAD in triage.

## 2021-10-09 NOTE — ED Notes (Signed)
65 yom c/c of left sided deltoid and armpit pain. The pt advised at 5 am this morning he was lifting a tote when he believes he may have hyperextended his left arm. The pt has limited mobility of his left arm. Over the counter medications were taken but the pt has had no relief.

## 2021-10-09 NOTE — ED Provider Notes (Signed)
[facility] Emergency Department Provider Note     Event Date/Time   First MD Initiated Contact with Patient 10/09/21 [phone]     (approximate)   History   Shoulder Pain   HPI  Marc [name] is a 46 y.o. male with a history of hypertension, gout, and CHF, presents to the ED for evaluation of acute left shoulder pain.  Patient reports he was lifting a large plastic storage container, when he felt something "tear" in his left shoulder.  Since that time patient has had significant pain and disability to the left shoulder.  Denies any previous history of shoulder problems or injuries on the left.  Denies any chest pain, distal paresthesias, or syncope.     Physical Exam   Triage Vital Signs: ED Triage Vitals  Enc Vitals Group     BP 10/09/21 0810 (!) 147/88     Pulse Rate 10/09/21 0810 68     Resp 10/09/21 0810 18     Temp 10/09/21 0810 98.7 F (37.1 C)     Temp Source 10/09/21 0810 Oral     SpO2 10/09/21 0810 97 %     Weight 10/09/21 0809 (!) 351 lb 13.7 oz (159.6 kg)     Height 10/09/21 0809 5\' 8"  (1.727 m)     Head Circumference --      Peak Flow --      Pain Score 10/09/21 0810 9     Pain Loc --      Pain Edu? --      Excl. in [place]? --     Most recent vital signs: Vitals:   10/09/21 0810  BP: (!) 147/88  Pulse: 68  Resp: 18  Temp: 98.7 F (37.1 C)  SpO2: 97%    General Awake, no distress.  CV:  Good peripheral perfusion.  RESP:  Normal effort.  ABD:  No distention.  MSK:  Left shoulder without any obvious deformity, dislocation, or sulcus sign.  Patient with normal active range of motion appreciated.  Nontender to palpation to the anterior biceps tendon.  Rotator cuff testing is intact without signs of deficit or complete tear.  Normal composite fist distally.  No indication of bicep muscle tear.   ED Results / Procedures / Treatments   Labs (all labs ordered are listed, but only abnormal results are displayed) Labs Reviewed -  No data to display   EKG    RADIOLOGY  I personally viewed and evaluated these images as part of my medical decision making, as well as reviewing the written report by the radiologist.  ED Provider Interpretation: no acute findings}  DG Shoulder Left  Result Date: 10/09/2021 CLINICAL DATA:  Shoulder injury. EXAM: LEFT SHOULDER - 2+ VIEW COMPARISON:  None. FINDINGS: Glenohumeral joint is intact. No evidence of scapular fracture or humeral fracture. The acromioclavicular joint is intact. IMPRESSION: No fracture or dislocation. Electronically Signed   By: Suzy Bouchard M.D.   On: 10/09/2021 08:46     PROCEDURES:  Critical Care performed: No  Procedures   MEDICATIONS ORDERED IN ED: Medications  ketorolac (TORADOL) injection 60 mg (60 mg Intramuscular Given 10/09/21 0904)     IMPRESSION / MDM / ASSESSMENT AND PLAN / ED COURSE  I reviewed the triage vital signs and the nursing notes.                              Differential diagnosis includes,  but is not limited to, shoulder strain, shoulder dislocation, biceps tear, rotator cuff tear, tendinitis  Patient ED evaluation of injury sustained while trying to lift a large storage container.  He experienced immediate pain to the anterior right shoulder.  Patient presents in no acute distress.  No indication of any biceps tendon deformity.  Patient is nontender palpation to the anterior shoulder with some mild pain elicited with rotator cuff resistance testing.  Patient's diagnosis is consistent with shoulder strain/rotator cuff tendinitis. Patient will be discharged home with prescriptions for prednisone and cyclobenzaprine. Patient is to follow up with Ortho as needed or otherwise directed. Patient is given ED precautions to return to the ED for any worsening or new symptoms.   FINAL CLINICAL IMPRESSION(S) / ED DIAGNOSES   Final diagnoses:  Strain of left shoulder, initial encounter     Rx / DC Orders   ED Discharge Orders           Ordered    cyclobenzaprine (FLEXERIL) 5 MG tablet  3 times daily PRN        10/09/21 0900    predniSONE (DELTASONE) 10 MG tablet  Daily with breakfast        10/09/21 0900             Note:  This document was prepared using Dragon voice recognition software and may include unintentional dictation errors.    Melvenia Needles, PA-C 10/09/21 1507    Naaman Plummer, MD 10/10/21 [phone]

## 2021-10-13 ENCOUNTER — Telehealth: Payer: Self-pay | Admitting: Nurse Practitioner

## 2021-10-13 ENCOUNTER — Telehealth: Payer: Self-pay | Admitting: *Deleted

## 2021-10-13 NOTE — Telephone Encounter (Signed)
Medication Refill - Medication: sacubitril-valsartan (ENTRESTO) 97-103 MG  dapagliflozin propanediol (FARXIGA) 10 MG TABS tablet  Spouse stated he usually gets these in the mail but needs them sent to [facility]   Has the patient contacted their pharmacy? No. (Agent: If no, request that the patient contact the pharmacy for the refill. If patient does not wish to contact the pharmacy document the reason why and proceed with request.) (Agent: If yes, when and what did the pharmacy advise?)  Preferred Pharmacy (with phone number or street name):  [facility] [place] - [address] Phone:  [phone]  Fax:  [phone]     Has the patient been seen for an appointment in the last year OR does the patient have an upcoming appointment? Yes.    Agent: Please be advised that RX refills may take up to 3 business days. We ask that you follow-up with your pharmacy.

## 2021-10-13 NOTE — Telephone Encounter (Signed)
Transition Care Management Unsuccessful Follow-up Telephone Call ? ?Date of discharge and from where:  [facility] ? ?Attempts:  1st Attempt ? ?Reason for unsuccessful TCM follow-up call:  Left voice message ? ?  ?

## 2021-10-14 NOTE — Telephone Encounter (Signed)
Requested medication (s) are due for refill today: no  Requested medication (s) are on the active medication list: yes  Last refill:  Farxiga 01/23/21 #90/3, [name] 11/10/19 #180/3  Future visit scheduled: no  Notes to clinic:  pt is requesting to be sent to local pharmacy, please advise d/t labs and no assigned protocol.      Requested Prescriptions  Pending Prescriptions Disp Refills   dapagliflozin propanediol (FARXIGA) 10 MG TABS tablet 90 tablet 3    Sig: Take 1 tablet (10 mg total) by mouth daily before breakfast.     Endocrinology:  Diabetes - SGLT2 Inhibitors Failed - 10/13/2021  1:29 PM      Failed - HBA1C is between 0 and 7.9 and within 180 days    Hgb A1c MFr Bld  Date Value Ref Range Status  01/31/2019 5.6 4.8 - 5.6 % Final    Comment:    (NOTE) Pre diabetes:          5.7%-6.4% Diabetes:              >6.4% Glycemic control for   <7.0% adults with diabetes           Passed - Cr in normal range and within 360 days    Creatinine, Ser  Date Value Ref Range Status  07/24/2021 0.97 0.76 - 1.27 mg/dL Final          Passed - eGFR in normal range and within 360 days    GFR calc Af Amer  Date Value Ref Range Status  01/08/2020 >60 >60 mL/min Final   GFR, Estimated  Date Value Ref Range Status  02/19/2021 >60 >60 mL/min Final    Comment:    (NOTE) Calculated using the CKD-EPI Creatinine Equation (2021)    eGFR  Date Value Ref Range Status  07/24/2021 97 >59 mL/min/1.73 Final          Passed - Valid encounter within last 6 months    Recent Outpatient Visits           2 months ago Congestive heart failure, unspecified HF chronicity, unspecified heart failure type ([place])   [facility] Jon Billings, NP   5 months ago Annual physical exam   [facility] Jon Billings, NP   9 months ago Congestive heart failure, unspecified HF chronicity, unspecified heart failure type [facility])   [facility] Jon Billings, NP                sacubitril-valsartan (ENTRESTO) 97-103 MG 180 tablet 3    Sig: Take 1 tablet by mouth 2 (two) times daily.     Off-Protocol Failed - 10/13/2021  1:29 PM      Failed - Medication not assigned to a protocol, review manually.      Passed - Valid encounter within last 12 months    Recent Outpatient Visits           2 months ago Congestive heart failure, unspecified HF chronicity, unspecified heart failure type [facility])   [facility] Jon Billings, NP   5 months ago Annual physical exam   [facility] Jon Billings, NP   9 months ago Congestive heart failure, unspecified HF chronicity, unspecified heart failure type [facility])   [facility] Jon Billings, NP

## 2021-10-14 NOTE — Telephone Encounter (Signed)
Transition Care Management Unsuccessful Follow-up Telephone Call   Date of discharge and from where:  [facility] 10-09-2021  Attempts:  2nd Attempt  Reason for unsuccessful TCM follow-up call:  Left voice message

## 2021-10-15 MED ORDER — SACUBITRIL-VALSARTAN 97-103 MG PO TABS: 1.0000 | ORAL_TABLET | Freq: Two times a day (BID) | ORAL | 0 refills | Status: DC

## 2021-10-15 MED ORDER — DAPAGLIFLOZIN PROPANEDIOL 10 MG PO TABS: 10.0000 mg | ORAL_TABLET | Freq: Every day | ORAL | 0 refills | Status: DC

## 2021-10-15 NOTE — Telephone Encounter (Signed)
Pt calling to follow up on medication refill. Pt mentioned he is out of medication dapagliflozin propanediol (FARXIGA) 10 MG TABS tablet ?Pt stated he reached out to pharmacy and pt was advised to contact PCP as they do not have any refills.  ? ?Preferred pharmacy:  ? ?[place] [facility] [place] [facility] [address] [place]  ?[place] [address]  ?Phone: [phone] Fax: [phone]  ?Hours: Not open 24 hours  ? ?

## 2021-10-15 NOTE — Telephone Encounter (Signed)
Patient was notified and made aware of Karen's recommendations. Patient verbalized understanding.  ?

## 2021-10-15 NOTE — Telephone Encounter (Signed)
Please let patient know that I do not prescribe Entresto.  He missed his last appt with the Heart Failure clinic and with me.  I did send him a 30 day supply of both the [place] to make sure he doesn't run out.  However, he will need to follow up at both places for refills. ?

## 2021-10-15 NOTE — Telephone Encounter (Signed)
Patient called and advised the [name] and Wilder Glade was refused due to the last refill was by his cardiologist. He says it's inconvenient to have them refilled at the heart clinic, so that's why he wants his primary to refill for 30 days to [facility]. He says it's cheaper to get these two at [facility]. I advised I will send to Santiago Glad for approval. ?

## 2021-10-15 NOTE — Addendum Note (Signed)
Addended by: Jon Billings on: 10/15/2021 03:49 PM ? ? Modules accepted: Orders ? ?

## 2021-10-16 ENCOUNTER — Encounter: Payer: Self-pay | Admitting: Family

## 2021-10-16 ENCOUNTER — Other Ambulatory Visit: Payer: Self-pay | Admitting: Family

## 2021-10-16 MED ORDER — SACUBITRIL-VALSARTAN 97-103 MG PO TABS: 1.0000 | ORAL_TABLET | Freq: Two times a day (BID) | ORAL | 5 refills | Status: DC

## 2021-10-18 DIAGNOSIS — G4733 Obstructive sleep apnea (adult) (pediatric): Secondary | ICD-10-CM | POA: Diagnosis not present

## 2021-10-23 NOTE — Progress Notes (Signed)
Patient ID: Marc [name], male    DOB: 07/10/1976, 46 y.o.   MRN: [id number]   Marc [name] is a 46 y/o male with a history of HTN, chronic pain, bipolar, current tobacco use and chronic heart failure.   Echo report from 10/10/2018 reviewed and showed an EF of 20-25% along with moderate Marc/TR.   RHC/LHC done 09/19/20 and showed: Severely depressed overall left ventricular function EF less than 25% Severely enlarged left ventricular chamber Left main large free of disease LAD was large and free of disease Circumflex was large and free of disease left dominant Right heart cath showed no evidence of pulmonary hypertension Mean PA was 23 mean wedge of 10 Cardiac output of 7.6 Fick  Was in the ED 10/09/21 due to left shoulder pain after lifting a heavy storage container. Evaluated and released.   He presents today for a follow-up visit with a chief complaint of moderate fatigue with little exertion. Describes this as chronic in nature. He has associated cough, shortness of breath, wheezing, pedal edema, palpitations, headaches, chronic pain and depression along with this. He denies any dizziness, abdominal distention or chest pain.   Has scales at home but admits that he hasn't been weighing himself every day. He has not taken his torsemide yet today because of the 45 minutes drive from his home but will take it upon return home. He doesn't always take the PM dose of torsemide.   Past Medical History:  Diagnosis Date   CHF (congestive heart failure) (HCC)    Hypertension    Past Surgical History:  Procedure Laterality Date   BICEPS TENDON REPAIR     COLONOSCOPY WITH PROPOFOL N/A 10/09/2020   Procedure: COLONOSCOPY WITH PROPOFOL;  Surgeon: Marc Landsman, MD;  Location: [facility] ENDOSCOPY;  Service: Gastroenterology;  Laterality: N/A;   FLEXIBLE SIGMOIDOSCOPY N/A 10/30/2020   Procedure: FLEXIBLE SIGMOIDOSCOPY;  Surgeon: Marc Landsman, MD;  Location: [facility] ENDOSCOPY;  Service:  Gastroenterology;  Laterality: N/A;   FOOT SURGERY     metataral and fasciotomy   RIGHT/LEFT HEART CATH AND CORONARY ANGIOGRAPHY Bilateral 09/19/2020   Procedure: RIGHT/LEFT HEART CATH AND CORONARY ANGIOGRAPHY;  Surgeon: Marc Kida, MD;  Location: [facility];  Service: Cardiovascular;  Laterality: Bilateral;   ROTATOR CUFF REPAIR     Family History  Problem Relation Age of Onset   Stroke Mother    Dementia Mother    Hypertension Father    Diabetes Father    Hypertension Paternal Grandfather    Diabetes Paternal Grandfather    Social History   Tobacco Use   Smoking status: Every Day    Packs/day: 0.50    Years: 25.00    Pack years: 12.50    Types: Cigarettes   Smokeless tobacco: Never  Substance Use Topics   Alcohol use: Not Currently   Allergies  Allergen Reactions   Geodon [Ziprasidone] Other (See Comments)    paralysis   Tramadol Other (See Comments)    Pt stated that it gave him sores.   Amoxicillin Rash   Zithromax [Azithromycin] Rash   Prior to Admission medications   Medication Sig Start Date End Date Taking? Authorizing Provider  albuterol (VENTOLIN HFA) 108 (90 Base) MCG/ACT inhaler Inhale 2 puffs into the lungs every 6 (six) hours as needed for wheezing or shortness of breath. 12/19/20  Yes Marc Billings, NP  allopurinol (ZYLOPRIM) 100 MG tablet Take 1 tablet (100 mg total) by mouth daily. 12/19/20  Yes Marc Billings, NP  aspirin EC 81 MG tablet Take 81 mg by mouth daily.   Yes [provider]  carvedilol (COREG) 6.25 MG tablet Take 1 tablet (6.25 mg total) by mouth 2 (two) times daily. 08/12/20  Yes Marc [name], Otila Kluver A, FNP  cyclobenzaprine (FLEXERIL) 10 MG tablet Take 10 mg by mouth 3 (three) times daily. 10/16/21  Yes [provider]  dapagliflozin propanediol (FARXIGA) 10 MG TABS tablet Take 1 tablet (10 mg total) by mouth daily before breakfast. 10/15/21  Yes Marc Billings, NP  lidocaine (LIDODERM) 5 % Place 1 patch onto the  skin daily as needed (pain). Remove & Discard patch within 12 hours or as directed by MD   Yes [provider]  sacubitril-valsartan (ENTRESTO) 97-103 MG Take 1 tablet by mouth 2 (two) times daily. 10/16/21  Yes Marc [name] A, FNP  sildenafil (VIAGRA) 100 MG tablet Take 0.5-1 tablets (50-100 mg total) by mouth daily as needed for erectile dysfunction. 04/24/21  Yes Marc Billings, NP  spironolactone (ALDACTONE) 25 MG tablet Take 1 tablet (25 mg total) by mouth daily. 12/31/20  Yes Marc [name], Otila Kluver A, FNP  torsemide (DEMADEX) 20 MG tablet Take 40 mg by mouth 2 (two) times daily.   Yes [provider]  vitamin C (ASCORBIC ACID) 500 MG tablet Take 500 mg by mouth daily.   Yes [provider]    Review of Systems  Constitutional:  Positive for appetite change (decreased) and fatigue (tired "all the time").  HENT:  Negative for congestion, postnasal drip and sore throat.   Eyes: Negative.   Respiratory:  Positive for cough (at times), shortness of breath and wheezing (at times).   Cardiovascular:  Positive for palpitations (at times) and leg swelling (left leg). Negative for chest pain.  Gastrointestinal:  Negative for abdominal distention and abdominal pain.  Endocrine: Negative.   Genitourinary: Negative.   Musculoskeletal:  Positive for arthralgias (legs), back pain and neck pain.  Skin:  Positive for wound (right shin).  Allergic/Immunologic: Negative.   Neurological:  Positive for weakness (legs) and headaches. Negative for dizziness and light-headedness.  Hematological:  Negative for adenopathy. Does not bruise/bleed easily.  Psychiatric/Behavioral:  Positive for dysphoric mood. Negative for sleep disturbance (wearing CPAP). The patient is nervous/anxious.    Vitals:   10/24/21 0901  BP: 122/71  Pulse: 90  Resp: 18  SpO2: 100%  Weight: (!) 350 lb (158.8 kg)  Height: '5\' 9"'$  (1.753 m)   Wt Readings from Last 3 Encounters:  10/24/21 (!) 350 lb (158.8 kg)   10/09/21 (!) 351 lb 13.7 oz (159.6 kg)  07/24/21 (!) 351 lb 12.8 oz (159.6 kg)   Lab Results  Component Value Date   CREATININE 0.97 07/24/2021   CREATININE 0.86 04/24/2021   CREATININE 0.85 02/19/2021   Physical Exam Vitals and nursing note reviewed.  HENT:     Head: Normocephalic and atraumatic.  Cardiovascular:     Rate and Rhythm: Normal rate and regular rhythm.  Pulmonary:     Effort: Pulmonary effort is normal.     Breath sounds: No wheezing or rales.     Comments: Diminished due to chest wall size Abdominal:     General: There is no distension.     Palpations: Abdomen is soft.     Tenderness: There is no abdominal tenderness.  Musculoskeletal:        General: No tenderness.     Cervical back: Normal range of motion and neck supple.     Right lower leg: No  tenderness. No edema.     Left lower leg: No tenderness. No edema.  Skin:    General: Skin is warm and dry.  Neurological:     General: No focal deficit present.     Mental Status: He is alert and oriented to person, place, and time.  Psychiatric:        Attention and Perception: Attention normal.        Mood and Affect: Mood is anxious. Mood is not depressed.        Behavior: Behavior normal.    Assessment & Plan:  1. Chronic heart failure with reduced ejection fraction- - NYHA class III - euvolemic today - not weighing daily but does have scales; encouraged to resume weighing daily so that he can call for an overnight weight gain of >2 pounds or a weekly weight gain of >5 pounds; if he has this weight gain, he would also know to make sure he takes the PM dose of torsemide on that day - weight unchanged from last visit here 5.5 months ago - trying to increase his activity - not adding salt to his food and has been reading food labels - saw cardiology Dema Severin) 10/01/20 - on GDMT of carvedilol, entresto, farxiga & spironolactone; consider titrating carvedilol at next visit - BNP 09/10/20 was 44.1  2: HTN- -  BP looks good (122/71) - saw PCP Mathis Dad) 07/24/21; has to call and get f/u appointment scheduled - BMP from 07/24/21 reviewed and showed sodium 138, potassium 4.3, creatinine 0.97 and GFR 97  3: Anxiety/ depression- - patient says that previously he's talked to the therapist at [name] - feels like this is currently stable  4: Tobacco use- - smoking ~ 1/2 ppd of cigarettes - denies alcohol or drug use - complete cessation discussed for 3 minutes with the patient.   5: Obstructive sleep apnea- - saw pulmonology Humphrey Rolls) 11/18/20 - wearing his CPAP nightly & reports resting well   Patient did not bring his medications nor a list. Each medication was verbally reviewed with the patient and he was encouraged to bring the bottles to every visit to confirm accuracy of list.   Return in 3 months, sooner if needed.

## 2021-10-24 ENCOUNTER — Other Ambulatory Visit: Payer: Self-pay

## 2021-10-24 ENCOUNTER — Encounter: Payer: Self-pay | Admitting: Family

## 2021-10-24 ENCOUNTER — Ambulatory Visit: Payer: Medicare Other | Attending: Family | Admitting: Family

## 2021-10-24 VITALS — BP 122/71 | HR 90 | Resp 18 | Ht 69.0 in | Wt 350.0 lb

## 2021-10-24 DIAGNOSIS — I10 Essential (primary) hypertension: Secondary | ICD-10-CM | POA: Diagnosis not present

## 2021-10-24 DIAGNOSIS — Z720 Tobacco use: Secondary | ICD-10-CM

## 2021-10-24 DIAGNOSIS — I5022 Chronic systolic (congestive) heart failure: Secondary | ICD-10-CM | POA: Diagnosis not present

## 2021-10-24 DIAGNOSIS — G4733 Obstructive sleep apnea (adult) (pediatric): Secondary | ICD-10-CM | POA: Diagnosis not present

## 2021-10-24 DIAGNOSIS — F17210 Nicotine dependence, cigarettes, uncomplicated: Secondary | ICD-10-CM | POA: Insufficient documentation

## 2021-10-24 DIAGNOSIS — I110 Hypertensive heart disease with heart failure: Secondary | ICD-10-CM | POA: Insufficient documentation

## 2021-10-24 DIAGNOSIS — F419 Anxiety disorder, unspecified: Secondary | ICD-10-CM | POA: Diagnosis not present

## 2021-10-24 DIAGNOSIS — F32A Depression, unspecified: Secondary | ICD-10-CM | POA: Diagnosis not present

## 2021-10-24 DIAGNOSIS — Z9989 Dependence on other enabling machines and devices: Secondary | ICD-10-CM | POA: Diagnosis not present

## 2021-10-24 NOTE — Patient Instructions (Addendum)
Resume weighing daily and call for an overnight weight gain of 3 pounds or more or a weekly weight gain of more than 5 pounds.   If you have voicemail, please make sure your mailbox is cleaned out so that we may leave a message and please make sure to listen to any voicemails.     

## 2021-11-05 ENCOUNTER — Telehealth: Payer: Self-pay | Admitting: Nurse Practitioner

## 2021-11-05 NOTE — Telephone Encounter (Signed)
Copied from [place] [id number]. Topic: Medicare AWV ?>> Nov 05, 2021  1:52 PM Lavonia Drafts wrote: ?Reason for CRM:  ?Left message for patient to call back and schedule Medicare Annual Wellness Visit (AWV) to be done virtually or by telephone. ? ?No hx of AWV eligible as of 04/17/21 ? ?Please schedule at anytime with [facility] Health Advisor.     ? ?45 Minutes appointment  ? ?Any questions, please call me at [phone] ?

## 2021-11-17 ENCOUNTER — Ambulatory Visit (INDEPENDENT_AMBULATORY_CARE_PROVIDER_SITE_OTHER): Payer: Medicare Other | Admitting: Nurse Practitioner

## 2021-11-17 ENCOUNTER — Encounter: Payer: Self-pay | Admitting: Nurse Practitioner

## 2021-11-17 ENCOUNTER — Ambulatory Visit
Admission: RE | Admit: 2021-11-17 | Discharge: 2021-11-17 | Disposition: A | Discharge status: Home / Self Care | Payer: Medicare Other | Source: Ambulatory Visit | Attending: Nurse Practitioner | Admitting: Nurse Practitioner

## 2021-11-17 ENCOUNTER — Ambulatory Visit
Admission: RE | Admit: 2021-11-17 | Discharge: 2021-11-17 | Disposition: A | Discharge status: Home / Self Care | Payer: Medicare Other | Attending: Nurse Practitioner | Admitting: Nurse Practitioner

## 2021-11-17 VITALS — BP 135/88 | HR 89 | Temp 98.6°F | Wt 348.8 lb

## 2021-11-17 DIAGNOSIS — M25562 Pain in left knee: Secondary | ICD-10-CM

## 2021-11-17 DIAGNOSIS — B029 Zoster without complications: Secondary | ICD-10-CM

## 2021-11-17 MED ORDER — MELOXICAM 15 MG PO TABS: 15.0000 mg | ORAL_TABLET | Freq: Every day | ORAL | 0 refills | Status: DC

## 2021-11-17 MED ORDER — VALACYCLOVIR HCL 1 G PO TABS: 1000.0000 mg | ORAL_TABLET | Freq: Two times a day (BID) | ORAL | 0 refills | Status: AC

## 2021-11-17 NOTE — Progress Notes (Signed)
? ?BP 135/88 (BP Location: Right Arm)   Pulse 89   Temp 98.6 ?F (37 ?C) (Oral)   Wt (!) 348 lb 12.8 oz (158.2 kg)   SpO2 98%   BMI 51.51 kg/m?   ? ?Subjective:  ? ? Patient ID: Marc [name], male    DOB: August 09, 1976, 46 y.o.   MRN: [id number] ? ?HPI: ?Marc [name] is a 46 y.o. male ? ?Chief Complaint  ?Patient presents with  ? Knee Pain  ?  L knee pain and swelling ongoing x 1 month.   ? Insect Bite  ?  L shoulder ?insect bite. Pain going into neck, having headaches per patient. Taking benadryl OTC for symptoms.   ? ?KNEE PAIN ?Duration: months ?Involved knee: left ?Mechanism of injury:  fell about a month ago ?Location:diffuse ?Onset: sudden ?Severity: 8/10  ?Quality:  sharp, aching, and throbbing ?Frequency: constant ?Radiation: no ?Aggravating factors: walking and movement  ?Alleviating factors: nothing ?Status: worse ?Treatments attempted:  icy hot, ibuprofen, and aleve  ?Relief with NSAIDs?:  no ?Weakness with weight bearing or walking: yes ?Sensation of giving way: yes ?Locking: no ?Popping: no ?Bruising: no ?Swelling: yes ?Redness: no ?Paresthesias/decreased sensation: no ?Fevers: no ? ?Patient states he thinks he had a spider bite on his shoulder.  Causing him headaches and muscle stiffness.  ? ?Relevant past medical, surgical, family and social history reviewed and updated as indicated. Interim medical history since our last visit reviewed. ?Allergies and medications reviewed and updated. ? ?Review of Systems  ?Musculoskeletal:   ?     Left knee pain  ?Skin:   ?     Bug bite  ? ?Per HPI unless specifically indicated above ? ?   ?Objective:  ?  ?BP 135/88 (BP Location: Right Arm)   Pulse 89   Temp 98.6 ?F (37 ?C) (Oral)   Wt (!) 348 lb 12.8 oz (158.2 kg)   SpO2 98%   BMI 51.51 kg/m?   ?Wt Readings from Last 3 Encounters:  ?11/17/21 (!) 348 lb 12.8 oz (158.2 kg)  ?10/24/21 (!) 350 lb (158.8 kg)  ?10/09/21 (!) 351 lb 13.7 oz (159.6 kg)  ?  ?Physical Exam ?Vitals and nursing note reviewed.   ?Constitutional:   ?   General: He is not in acute distress. ?   Appearance: Normal appearance. He is not ill-appearing, toxic-appearing or diaphoretic.  ?HENT:  ?   Head: Normocephalic.  ?   Right Ear: External ear normal.  ?   Left Ear: External ear normal.  ?   Nose: Nose normal. No congestion or rhinorrhea.  ?   Mouth/Throat:  ?   Mouth: Mucous membranes are moist.  ?Eyes:  ?   General:     ?   Right eye: No discharge.     ?   Left eye: No discharge.  ?   Extraocular Movements: Extraocular movements intact.  ?   Conjunctiva/sclera: Conjunctivae normal.  ?   Pupils: Pupils are equal, round, and reactive to light.  ?Cardiovascular:  ?   Rate and Rhythm: Normal rate and regular rhythm.  ?   Heart sounds: No murmur heard. ?Pulmonary:  ?   Effort: Pulmonary effort is normal. No respiratory distress.  ?   Breath sounds: Normal breath sounds. No wheezing, rhonchi or rales.  ?Abdominal:  ?   General: Abdomen is flat. Bowel sounds are normal.  ?Musculoskeletal:  ?   Cervical back: Normal range of motion and neck supple.  ?Skin: ?  General: Skin is warm and dry.  ?   Capillary Refill: Capillary refill takes less than 2 seconds.  ? ?    ?Neurological:  ?   General: No focal deficit present.  ?   Mental Status: He is alert and oriented to person, place, and time.  ?Psychiatric:     ?   Mood and Affect: Mood normal.     ?   Behavior: Behavior normal.     ?   Thought Content: Thought content normal.     ?   Judgment: Judgment normal.  ? ? ?Results for orders placed or performed in visit on 07/24/21  ?Comp Met (CMET)  ?Result Value Ref Range  ? Glucose 88 70 - 99 mg/dL  ? BUN 10 6 - 24 mg/dL  ? Creatinine, Ser 0.97 0.76 - 1.27 mg/dL  ? eGFR 97 >59 mL/min/1.73  ? BUN/Creatinine Ratio 10 9 - 20  ? Sodium 138 134 - 144 mmol/L  ? Potassium 4.3 3.5 - 5.2 mmol/L  ? Chloride 99 96 - 106 mmol/L  ? CO2 22 20 - 29 mmol/L  ? Calcium 9.3 8.7 - 10.2 mg/dL  ? Total Protein 7.5 6.0 - 8.5 g/dL  ? Albumin 4.1 4.0 - 5.0 g/dL  ? Globulin,  Total 3.4 1.5 - 4.5 g/dL  ? Albumin/Globulin Ratio 1.2 1.2 - 2.2  ? Bilirubin Total <0.2 0.0 - 1.2 mg/dL  ? Alkaline Phosphatase 129 (H) 44 - 121 IU/L  ? AST 23 0 - 40 IU/L  ? ALT 12 0 - 44 IU/L  ?Uric acid  ?Result Value Ref Range  ? Uric Acid 7.0 3.8 - 8.4 mg/dL  ? ?   ?Assessment & Plan:  ? ?Problem List Items Addressed This Visit   ?None ?Visit Diagnoses   ? ? Acute pain of left knee    -  Primary  ? Will obtain xray of knee.  Will give Mobic for pain.  Will make recommendations based on xray. will likely need to see Ortho.   ? Relevant Orders  ? DG Knee Complete 4 Views Left  ? Herpes zoster without complication      ? Will treat with Valacylovir.  Discussed course of treatment and signs and symptoms to monitor for.  Will make reocmmendations based on lab results.   ? Relevant Medications  ? valACYclovir (VALTREX) 1000 MG tablet  ? ?  ?  ? ?Follow up plan: ?Return if symptoms worsen or fail to improve. ? ? ? ? ? ?

## 2021-11-18 DIAGNOSIS — G4733 Obstructive sleep apnea (adult) (pediatric): Secondary | ICD-10-CM | POA: Diagnosis not present

## 2021-11-18 NOTE — Progress Notes (Signed)
Referral placed.

## 2021-11-18 NOTE — Addendum Note (Signed)
Addended by: Jon Billings on: 11/18/2021 08:48 AM ? ? Modules accepted: Orders ? ?

## 2021-11-18 NOTE — Progress Notes (Signed)
Please let patient know that his pain is likely related to the severe compartmental arthritis.  I recommend he see Orthopedics for this.  If he agrees, I will place the referral.

## 2021-11-24 ENCOUNTER — Other Ambulatory Visit: Payer: Self-pay | Admitting: Nurse Practitioner

## 2021-11-25 NOTE — Telephone Encounter (Signed)
Requested medication (s) are due for refill today: yes ? ?Requested medication (s) are on the active medication list: yes ? ?Last refill:  10/15/21 #30/0 ? ?Future visit scheduled: no ? ?Notes to clinic:  Unable to refill per protocol due to failed labs, no updated results. ? ?  ?Requested Prescriptions  ?Pending Prescriptions Disp Refills  ? FARXIGA 10 MG TABS tablet [Pharmacy Med Name: Farxiga 10 MG Oral Tablet] 30 tablet 0  ?  Sig: TAKE 1 TABLET BY MOUTH ONCE DAILY BEFORE BREAKFAST  ?  ? Endocrinology:  Diabetes - SGLT2 Inhibitors Failed - 11/24/2021 12:08 PM  ?  ?  Failed - HBA1C is between 0 and 7.9 and within 180 days  ?  Hgb A1c MFr Bld  ?Date Value Ref Range Status  ?01/31/2019 5.6 4.8 - 5.6 % Final  ?  Comment:  ?  (NOTE) ?Pre diabetes:          5.7%-6.4% ?Diabetes:              >6.4% ?Glycemic control for   <7.0% ?adults with diabetes ?  ?  ?  ?  ?  Passed - Cr in normal range and within 360 days  ?  Creatinine, Ser  ?Date Value Ref Range Status  ?07/24/2021 0.97 0.76 - 1.27 mg/dL Final  ?  ?  ?  ?  Passed - eGFR in normal range and within 360 days  ?  GFR calc Af Amer  ?Date Value Ref Range Status  ?01/08/2020 >60 >60 mL/min Final  ? ?GFR, Estimated  ?Date Value Ref Range Status  ?02/19/2021 >60 >60 mL/min Final  ?  Comment:  ?  (NOTE) ?Calculated using the CKD-EPI Creatinine Equation (2021) ?  ? ?eGFR  ?Date Value Ref Range Status  ?07/24/2021 97 >59 mL/min/1.73 Final  ?  ?  ?  ?  Passed - Valid encounter within last 6 months  ?  Recent Outpatient Visits   ? ?      ? 1 week ago Acute pain of left knee  ? [place], NP  ? 4 months ago Congestive heart failure, unspecified HF chronicity, unspecified heart failure type ([place])  ? [place], NP  ? 7 months ago Annual physical exam  ? [place], NP  ? 11 months ago Congestive heart failure, unspecified HF chronicity, unspecified heart failure type ([place])  ? [facility] Jon Billings, NP  ? ?  ?  ? ?  ?  ?  ? ?

## 2021-12-18 DIAGNOSIS — G4733 Obstructive sleep apnea (adult) (pediatric): Secondary | ICD-10-CM | POA: Diagnosis not present

## 2022-01-18 DIAGNOSIS — G4733 Obstructive sleep apnea (adult) (pediatric): Secondary | ICD-10-CM | POA: Diagnosis not present

## 2022-01-20 ENCOUNTER — Other Ambulatory Visit: Payer: Self-pay | Admitting: Family Medicine

## 2022-01-20 DIAGNOSIS — M1712 Unilateral primary osteoarthritis, left knee: Secondary | ICD-10-CM | POA: Diagnosis not present

## 2022-01-20 NOTE — Telephone Encounter (Signed)
Requested medications are due for refill today.  yes  Requested medications are on the active medications list.  yes  Last refill. 11/25/2021 #300 refills  Future visit scheduled.   no  Notes to clinic.  Labs are expired.     Requested Prescriptions  Pending Prescriptions Disp Refills   FARXIGA 10 MG TABS tablet [Pharmacy Med Name: Farxiga 10 MG Oral Tablet] 30 tablet 0    Sig: TAKE 1 TABLET BY MOUTH ONCE DAILY BEFORE BREAKFAST     Endocrinology:  Diabetes - SGLT2 Inhibitors Failed - 01/20/2022 10:58 AM      Failed - HBA1C is between 0 and 7.9 and within 180 days    Hgb A1c MFr Bld  Date Value Ref Range Status  01/31/2019 5.6 4.8 - 5.6 % Final    Comment:    (NOTE) Pre diabetes:          5.7%-6.4% Diabetes:              >6.4% Glycemic control for   <7.0% adults with diabetes          Passed - Cr in normal range and within 360 days    Creatinine, Ser  Date Value Ref Range Status  07/24/2021 0.97 0.76 - 1.27 mg/dL Final         Passed - eGFR in normal range and within 360 days    GFR calc Af Amer  Date Value Ref Range Status  01/08/2020 >60 >60 mL/min Final   GFR, Estimated  Date Value Ref Range Status  02/19/2021 >60 >60 mL/min Final    Comment:    (NOTE) Calculated using the CKD-EPI Creatinine Equation (2021)    eGFR  Date Value Ref Range Status  07/24/2021 97 >59 mL/min/1.73 Final         Passed - Valid encounter within last 6 months    Recent Outpatient Visits           2 months ago Acute pain of left knee   [facility] Jon Billings, NP   6 months ago Congestive heart failure, unspecified HF chronicity, unspecified heart failure type ([place])   [facility] Jon Billings, NP   9 months ago Annual physical exam   [facility] Jon Billings, NP   1 year ago Congestive heart failure, unspecified HF chronicity, unspecified heart failure type [facility])   [facility] Jon Billings, NP

## 2022-01-20 NOTE — Progress Notes (Unsigned)
Marc [name] ID: Marc [name], male    DOB: Sep 07, 1975, 46 y.o.   MRN: [id number]   Marc [name] is a 46 y/o male with a history of HTN, chronic pain, bipolar, current tobacco use and chronic heart failure.   Echo report from 10/10/2018 reviewed and showed an EF of 20-25% along with moderate Marc/TR.   RHC/LHC done 09/19/20 and showed: Severely depressed overall left ventricular function EF less than 25% Severely enlarged left ventricular chamber Left main large free of disease LAD was large and free of disease Circumflex was large and free of disease left dominant Right heart cath showed no evidence of pulmonary hypertension Mean PA was 23 mean wedge of 10 Cardiac output of 7.6 Fick  Was in the ED 10/09/21 due to left shoulder pain after lifting a heavy storage container. Evaluated and released.   Marc [name] presents today for a follow-up visit with a chief complaint of moderate fatigue with minimal exertion. Describes this as chronic in nature having been present for several years. Marc [name] has associated decreased appetite, cough, shortness of breath, wheezing, pedal edema, palpitations, headaches, leg weakness and depression along with this. Denies any difficulty sleeping, abdominal distention, chest pain, dizziness or weight gain.   Brother suddenly died last night and his brother also had HF so Marc [name] wants to do everything Marc [name] can for his health.   Saw orthopaedic yesterday for left knee pain and received an injection.   Past Medical History:  Diagnosis Date   CHF (congestive heart failure) (HCC)    Hypertension    Past Surgical History:  Procedure Laterality Date   BICEPS TENDON REPAIR     COLONOSCOPY WITH PROPOFOL N/A 10/09/2020   Procedure: COLONOSCOPY WITH PROPOFOL;  Surgeon: Lin Landsman, MD;  Location: [facility] ENDOSCOPY;  Service: Gastroenterology;  Laterality: N/A;   FLEXIBLE SIGMOIDOSCOPY N/A 10/30/2020   Procedure: FLEXIBLE SIGMOIDOSCOPY;  Surgeon: Lin Landsman, MD;  Location:  [facility] ENDOSCOPY;  Service: Gastroenterology;  Laterality: N/A;   FOOT SURGERY     metataral and fasciotomy   RIGHT/LEFT HEART CATH AND CORONARY ANGIOGRAPHY Bilateral 09/19/2020   Procedure: RIGHT/LEFT HEART CATH AND CORONARY ANGIOGRAPHY;  Surgeon: Yolonda Kida, MD;  Location: [facility];  Service: Cardiovascular;  Laterality: Bilateral;   ROTATOR CUFF REPAIR     Family History  Problem Relation Age of Onset   Stroke Mother    Dementia Mother    Hypertension Father    Diabetes Father    Hypertension Paternal Grandfather    Diabetes Paternal Grandfather    Social History   Tobacco Use   Smoking status: Every Day    Packs/day: 0.50    Years: 25.00    Pack years: 12.50    Types: Cigarettes   Smokeless tobacco: Never  Substance Use Topics   Alcohol use: Not Currently   Allergies  Allergen Reactions   Geodon [Ziprasidone] Other (See Comments)    paralysis   Tramadol Other (See Comments)    Pt stated that it gave him sores.   Amoxicillin Rash   Zithromax [Azithromycin] Rash   Prior to Admission medications   Medication Sig Start Date End Date Taking? Authorizing Provider  albuterol (VENTOLIN HFA) 108 (90 Base) MCG/ACT inhaler Inhale 2 puffs into the lungs every 6 (six) hours as needed for wheezing or shortness of breath. 12/19/20  Yes Jon Billings, NP  aspirin EC 81 MG tablet Take 81 mg by mouth daily.   Yes [provider]  carvedilol (COREG) 6.25  MG tablet Take 1 tablet (6.25 mg total) by mouth 2 (two) times daily. 08/12/20  Yes Darylene Price A, FNP  docusate sodium (COLACE) 100 MG capsule Take 100 mg by mouth daily as needed for mild constipation.   Yes [provider]  FARXIGA 10 MG TABS tablet TAKE 1 TABLET BY MOUTH ONCE DAILY BEFORE BREAKFAST 11/25/21  Yes Johnson, Megan P, DO  sacubitril-valsartan (ENTRESTO) 97-103 MG Take 1 tablet by mouth 2 (two) times daily. 10/16/21  Yes Darylene Price A, FNP  spironolactone (ALDACTONE) 25 MG tablet Take  1 tablet (25 mg total) by mouth daily. 12/31/20  Yes Phoua Hoadley, Otila Kluver A, FNP  tiZANidine (ZANAFLEX) 4 MG tablet Take 4 mg by mouth at bedtime.   Yes [provider]  torsemide (DEMADEX) 20 MG tablet Take 40-80 mg by mouth daily.   Yes [provider]  allopurinol (ZYLOPRIM) 100 MG tablet Take 1 tablet (100 mg total) by mouth daily. Marc [name] not taking: Reported on 01/21/2022 12/19/20   Jon Billings, NP  cyclobenzaprine (FLEXERIL) 10 MG tablet Take 10 mg by mouth 3 (three) times daily. Marc [name] not taking: Reported on 01/21/2022 10/16/21   [provider]  sildenafil (VIAGRA) 100 MG tablet Take 0.5-1 tablets (50-100 mg total) by mouth daily as needed for erectile dysfunction. Marc [name] not taking: Reported on 01/21/2022 04/24/21   Jon Billings, NP   Review of Systems  Constitutional:  Positive for appetite change (decreased) and fatigue (tired "all the time").  HENT:  Negative for congestion, postnasal drip and sore throat.   Eyes: Negative.   Respiratory:  Positive for cough (at times), shortness of breath and wheezing (at times).   Cardiovascular:  Positive for palpitations (at times) and leg swelling (minimal). Negative for chest pain.  Gastrointestinal:  Negative for abdominal distention and abdominal pain.  Endocrine: Negative.   Genitourinary: Negative.   Musculoskeletal:  Positive for arthralgias (left knee), back pain and neck pain.  Skin:  Positive for wound (right shin).  Allergic/Immunologic: Negative.   Neurological:  Positive for weakness (legs) and headaches. Negative for dizziness and light-headedness.  Hematological:  Negative for adenopathy. Does not bruise/bleed easily.  Psychiatric/Behavioral:  Positive for dysphoric mood. Negative for sleep disturbance (wearing CPAP). The Marc [name] is nervous/anxious.    Vitals:   01/21/22 0851 01/21/22 0854  BP: (!) 142/84 134/87  Pulse: (!) 106 100  Resp: 16   SpO2: 100%   Weight: (!) 350 lb 6 oz (158.9 kg)    Height: '5\' 9"'$  (1.753 m)    Wt Readings from Last 3 Encounters:  01/21/22 (!) 350 lb 6 oz (158.9 kg)  11/17/21 (!) 348 lb 12.8 oz (158.2 kg)  10/24/21 (!) 350 lb (158.8 kg)   Lab Results  Component Value Date   CREATININE 0.80 01/21/2022   CREATININE 0.97 07/24/2021   CREATININE 0.86 04/24/2021   Physical Exam Vitals and nursing note reviewed.  HENT:     Head: Normocephalic and atraumatic.  Cardiovascular:     Rate and Rhythm: Regular rhythm. Tachycardia present.  Pulmonary:     Effort: Pulmonary effort is normal.     Breath sounds: No wheezing or rales.     Comments: Diminished due to chest wall size Abdominal:     General: There is no distension.     Palpations: Abdomen is soft.     Tenderness: There is no abdominal tenderness.  Musculoskeletal:        General: No tenderness.     Cervical back: Normal range of  motion and neck supple.     Right lower leg: No tenderness. No edema.     Left lower leg: No tenderness. No edema.  Skin:    General: Skin is warm and dry.  Neurological:     General: No focal deficit present.     Mental Status: Marc [name] is alert and oriented to person, place, and time.  Psychiatric:        Attention and Perception: Attention normal.        Mood and Affect: Mood is depressed. Mood is not anxious.        Behavior: Behavior normal.    Assessment & Plan:  1. Chronic heart failure with reduced ejection fraction- - NYHA class III - euvolemic today - not weighing daily but does have scales; encouraged to resume weighing daily so that Marc [name] can call for an overnight weight gain of >2 pounds or a weekly weight gain of >5 pounds - weight stable from last visit here 3 months ago - trying to increase his activity but difficult due to knee pain - not adding salt to his food and has been reading food labels - saw cardiology Dema Severin) 10/01/20 - on GDMT of carvedilol, entresto, farxiga & spironolactone - discussed ordering echo but Marc [name] sees cardiology next week so  will ask him about ordering it - BNP 09/10/20 was 44.1 - PharmD reconciled medications with the Marc [name]  2: HTN- - BP mildly elevated (134/87) but Marc [name] hasn't taken his medications yet today - saw PCP Mathis Dad) 11/17/21; Marc [name]'s thinking of changing providers - BMP from 07/24/21 reviewed and showed sodium 138, potassium 4.3, creatinine 0.97 and GFR 97  3: Anxiety/ depression- - Marc [name] says that previously Marc [name]'s talked to the therapist at [name] - brother suddenly died last night  - emotional support given  4: Tobacco use- - smoking ~ 1/2 ppd of cigarettes - denies alcohol or drug use - complete cessation discussed for 3 minutes with the Marc [name].   5: Obstructive sleep apnea- - saw pulmonology Humphrey Rolls) 11/18/20 - wearing his CPAP nightly & reports resting well   Marc [name] did not bring his medications nor a list. Each medication was verbally reviewed with the Marc [name] and Marc [name] was encouraged to bring the bottles to every visit to confirm accuracy of list.   Return in 1 month, sooner if needed.

## 2022-01-21 ENCOUNTER — Encounter: Payer: Self-pay | Admitting: Family

## 2022-01-21 ENCOUNTER — Telehealth: Payer: Self-pay

## 2022-01-21 ENCOUNTER — Encounter: Payer: Self-pay | Admitting: Pharmacist

## 2022-01-21 ENCOUNTER — Ambulatory Visit: Payer: Medicare Other | Attending: Family | Admitting: Family

## 2022-01-21 VITALS — BP 134/87 | HR 100 | Resp 16 | Ht 69.0 in | Wt 350.4 lb

## 2022-01-21 DIAGNOSIS — Z7984 Long term (current) use of oral hypoglycemic drugs: Secondary | ICD-10-CM | POA: Insufficient documentation

## 2022-01-21 DIAGNOSIS — F419 Anxiety disorder, unspecified: Secondary | ICD-10-CM

## 2022-01-21 DIAGNOSIS — I110 Hypertensive heart disease with heart failure: Secondary | ICD-10-CM | POA: Insufficient documentation

## 2022-01-21 DIAGNOSIS — Z9989 Dependence on other enabling machines and devices: Secondary | ICD-10-CM

## 2022-01-21 DIAGNOSIS — F32A Depression, unspecified: Secondary | ICD-10-CM | POA: Insufficient documentation

## 2022-01-21 DIAGNOSIS — G4733 Obstructive sleep apnea (adult) (pediatric): Secondary | ICD-10-CM | POA: Insufficient documentation

## 2022-01-21 DIAGNOSIS — F17210 Nicotine dependence, cigarettes, uncomplicated: Secondary | ICD-10-CM | POA: Insufficient documentation

## 2022-01-21 DIAGNOSIS — I5022 Chronic systolic (congestive) heart failure: Secondary | ICD-10-CM | POA: Insufficient documentation

## 2022-01-21 DIAGNOSIS — Z79899 Other long term (current) drug therapy: Secondary | ICD-10-CM | POA: Insufficient documentation

## 2022-01-21 DIAGNOSIS — Z720 Tobacco use: Secondary | ICD-10-CM | POA: Diagnosis not present

## 2022-01-21 DIAGNOSIS — I10 Essential (primary) hypertension: Secondary | ICD-10-CM | POA: Diagnosis not present

## 2022-01-21 LAB — BASIC METABOLIC PANEL
Anion gap: 8 (ref 5–15)
BUN: 15 mg/dL (ref 6–20)
CO2: 24 mmol/L (ref 22–32)
Calcium: 9.6 mg/dL (ref 8.9–10.3)
Chloride: 104 mmol/L (ref 98–111)
Creatinine, Ser: 0.8 mg/dL (ref 0.61–1.24)
GFR, Estimated: >60 mL/min (ref >60)
Glucose, Bld: 133 mg/dL — ABNORMAL HIGH (ref 70–99)
Potassium: 4.1 mmol/L (ref 3.5–5.1)
Sodium: 136 mmol/L (ref 135–145)

## 2022-01-21 NOTE — Telephone Encounter (Addendum)
Message and normal lab results relayed to patient. Patient verbalized understanding and had no questions or concerns at this time.  Georg Ruddle, RN  ----- Message from Alisa Graff, [place] sent at 01/21/2022 11:56 AM EDT ----- Sodium, potassium and kidney function look great! Continue medications

## 2022-01-21 NOTE — Patient Instructions (Addendum)
Resume weighing daily and call for an overnight weight gain of 3 pounds or more or a weekly weight gain of more than 5 pounds.;   If you have voicemail, please make sure your mailbox is cleaned out so that we may leave a message and please make sure to listen to any voicemails.    If you receive a satisfaction survey regarding the [facility], please take the time to fill it out. This way we can continue to provide excellent care and make any changes that need to be made.     When you see Dr. Clayborn Bigness next week, ask him to schedule an echocardiogram.

## 2022-01-21 NOTE — Progress Notes (Signed)
[facility] - PHARMACIST COUNSELING NOTE  Guideline-Directed Medical Therapy/Evidence Based Medicine  ACE/ARB/ARNI: Sacubitril-valsartan 97-103 mg twice daily Beta Blocker: Carvedilol 6.25 mg twice daily Aldosterone Antagonist: Spironolactone 25 mg daily Diuretic: Torsemide 40 mg BID SGLT2i: Dapagliflozin 10 mg daily  Adherence Assessment  Do you ever forget to take your medication? '[]'$ Yes '[x]'$ No  Do you ever skip doses due to side effects? '[]'$ Yes '[x]'$ No  Do you have trouble affording your medicines? '[]'$ Yes '[x]'$ No  Are you ever unable to pick up your medication due to transportation difficulties? '[]'$ Yes '[x]'$ No  Do you ever stop taking your medications because you don't believe they are helping? '[]'$ Yes '[x]'$ No  Do you check your weight daily? '[x]'$ Yes '[]'$ No   Adherence strategy: none  Barriers to obtaining medications: none  Vital signs: HR 100, BP 134/87, weight (pounds) 350 lb ECHO: Date 2/24/202, EF 20-25%, notes moderate MR/TR Cath: Date 09/19/20, EF 25%, notes severely enlarged LV     Latest Ref Rng & Units 07/24/2021    9:48 AM 04/24/2021   11:31 AM 02/19/2021    9:50 AM  BMP  Glucose 70 - 99 mg/dL 88   82   96    BUN 6 - 24 mg/dL '10   9   10    '$ Creatinine 0.76 - 1.27 mg/dL 0.97   0.86   0.85    BUN/Creat Ratio 9 - '20 10   10     '$ Sodium 134 - 144 mmol/L 138   139   138    Potassium 3.5 - 5.2 mmol/L 4.3   4.2   3.6    Chloride 96 - 106 mmol/L 99   99   103    CO2 20 - 29 mmol/L '22   23   26    '$ Calcium 8.7 - 10.2 mg/dL 9.3   8.9   9.0      Past Medical History:  Diagnosis Date   CHF (congestive heart failure) ([place])    Hypertension     ASSESSMENT 46 year old male who presents to the HF clinic for follow up. PMH includes HTN, chronic pain, bipolar, current tobacco use and chronic heart failure. Last ED visit was on 10/09/21 for shoulder pain.  Patient recently working on persistent knee pain and got steroid injection yesterday 01/20/22.  Also dealing with recent passing of his brother. Denies problems with current therapy or issue affording medication.   PLAN (recommendations) Repeat BMP today Continue current medication without changes Follow up as recommended by NP   Time spent: 20 minutes  Domenick Quebedeaux Rodriguez-Guzman PharmD, BCPS 01/21/2022 11:20 AM   Current Outpatient Medications:    albuterol (VENTOLIN HFA) 108 (90 Base) MCG/ACT inhaler, Inhale 2 puffs into the lungs every 6 (six) hours as needed for wheezing or shortness of breath., Disp: 18 g, Rfl: 1   allopurinol (ZYLOPRIM) 100 MG tablet, Take 1 tablet (100 mg total) by mouth daily. (Patient not taking: Reported on 01/21/2022), Disp: 90 tablet, Rfl: 1   aspirin EC 81 MG tablet, Take 81 mg by mouth daily., Disp: , Rfl:    carvedilol (COREG) 6.25 MG tablet, Take 1 tablet (6.25 mg total) by mouth 2 (two) times daily., Disp: 180 tablet, Rfl: 3   cyclobenzaprine (FLEXERIL) 10 MG tablet, Take 10 mg by mouth 3 (three) times daily. (Patient not taking: Reported on 01/21/2022), Disp: , Rfl:    docusate sodium (COLACE) 100 MG capsule, Take 100 mg by mouth daily as needed for  mild constipation., Disp: , Rfl:    FARXIGA 10 MG TABS tablet, TAKE 1 TABLET BY MOUTH ONCE DAILY BEFORE BREAKFAST, Disp: 30 tablet, Rfl: 0   sacubitril-valsartan (ENTRESTO) 97-103 MG, Take 1 tablet by mouth 2 (two) times daily., Disp: 60 tablet, Rfl: 5   sildenafil (VIAGRA) 100 MG tablet, Take 0.5-1 tablets (50-100 mg total) by mouth daily as needed for erectile dysfunction. (Patient not taking: Reported on 01/21/2022), Disp: 10 tablet, Rfl: 2   spironolactone (ALDACTONE) 25 MG tablet, Take 1 tablet (25 mg total) by mouth daily., Disp: 90 tablet, Rfl: 3   tiZANidine (ZANAFLEX) 4 MG tablet, Take 4 mg by mouth at bedtime., Disp: , Rfl:    torsemide (DEMADEX) 20 MG tablet, Take 40-80 mg by mouth daily., Disp: , Rfl:    MEDICATION ADHERENCES TIPS AND STRATEGIES Taking medication as prescribed improves patient  outcomes in heart failure (reduces hospitalizations, improves symptoms, increases survival) Side effects of medications can be managed by decreasing doses, switching agents, stopping drugs, or adding additional therapy. Please let someone in the [facility] know if you have having bothersome side effects so we can modify your regimen. Do not alter your medication regimen without talking to [place].  Medication reminders can help patients remember to take drugs on time. If you are missing or forgetting doses you can try linking behaviors, using pill boxes, or an electronic reminder like an alarm on your phone or an app. Some people can also get automated phone calls as medication reminders.

## 2022-01-22 ENCOUNTER — Other Ambulatory Visit
Admission: RE | Admit: 2022-01-22 | Discharge: 2022-01-22 | Disposition: A | Discharge status: Home / Self Care | Payer: Medicare Other | Source: Ambulatory Visit | Attending: Family | Admitting: Family

## 2022-01-22 DIAGNOSIS — I5022 Chronic systolic (congestive) heart failure: Secondary | ICD-10-CM | POA: Insufficient documentation

## 2022-01-27 ENCOUNTER — Other Ambulatory Visit: Payer: Self-pay | Admitting: Internal Medicine

## 2022-01-27 DIAGNOSIS — I10 Essential (primary) hypertension: Secondary | ICD-10-CM | POA: Diagnosis not present

## 2022-01-27 DIAGNOSIS — E785 Hyperlipidemia, unspecified: Secondary | ICD-10-CM | POA: Diagnosis not present

## 2022-01-27 DIAGNOSIS — Z9989 Dependence on other enabling machines and devices: Secondary | ICD-10-CM | POA: Diagnosis not present

## 2022-01-27 DIAGNOSIS — R42 Dizziness and giddiness: Secondary | ICD-10-CM | POA: Diagnosis not present

## 2022-01-27 DIAGNOSIS — R202 Paresthesia of skin: Secondary | ICD-10-CM | POA: Diagnosis not present

## 2022-01-27 DIAGNOSIS — R200 Anesthesia of skin: Secondary | ICD-10-CM | POA: Diagnosis not present

## 2022-01-27 DIAGNOSIS — I428 Other cardiomyopathies: Secondary | ICD-10-CM | POA: Diagnosis not present

## 2022-01-27 DIAGNOSIS — I509 Heart failure, unspecified: Secondary | ICD-10-CM | POA: Diagnosis not present

## 2022-01-27 DIAGNOSIS — M79609 Pain in unspecified limb: Secondary | ICD-10-CM | POA: Diagnosis not present

## 2022-01-27 DIAGNOSIS — G4733 Obstructive sleep apnea (adult) (pediatric): Secondary | ICD-10-CM | POA: Diagnosis not present

## 2022-01-28 ENCOUNTER — Ambulatory Visit
Admission: RE | Admit: 2022-01-28 | Discharge: 2022-01-28 | Disposition: A | Discharge status: Home / Self Care | Payer: Medicare Other | Source: Ambulatory Visit | Attending: Internal Medicine | Admitting: Internal Medicine

## 2022-01-28 DIAGNOSIS — R42 Dizziness and giddiness: Secondary | ICD-10-CM | POA: Insufficient documentation

## 2022-01-28 DIAGNOSIS — R200 Anesthesia of skin: Secondary | ICD-10-CM | POA: Insufficient documentation

## 2022-01-28 DIAGNOSIS — R202 Paresthesia of skin: Secondary | ICD-10-CM | POA: Insufficient documentation

## 2022-02-04 ENCOUNTER — Ambulatory Visit
Admission: RE | Admit: 2022-02-04 | Discharge: 2022-02-04 | Disposition: A | Discharge status: Home / Self Care | Payer: Medicare Other | Source: Ambulatory Visit | Attending: Internal Medicine | Admitting: Internal Medicine

## 2022-02-04 ENCOUNTER — Other Ambulatory Visit: Payer: Self-pay | Admitting: Family Medicine

## 2022-02-04 DIAGNOSIS — I509 Heart failure, unspecified: Secondary | ICD-10-CM | POA: Diagnosis not present

## 2022-02-04 DIAGNOSIS — I110 Hypertensive heart disease with heart failure: Secondary | ICD-10-CM | POA: Diagnosis not present

## 2022-02-04 LAB — ECHOCARDIOGRAM COMPLETE: S' Lateral: 6.4 cm

## 2022-02-04 NOTE — Progress Notes (Signed)
*  PRELIMINARY RESULTS* Echocardiogram 2D Echocardiogram has been performed.  Sherrie Sport 02/04/2022, 10:12 AM

## 2022-02-05 NOTE — Telephone Encounter (Signed)
Requested medication (s) are due for refill today:   Yes  Requested medication (s) are on the active medication list:   Yes  Future visit scheduled:   No   Last ordered: 11/25/2021 #30, 0 refills  Returned because A1C due per protocol   Requested Prescriptions  Pending Prescriptions Disp Refills   FARXIGA 10 MG TABS tablet [Pharmacy Med Name: Farxiga 10 MG Oral Tablet] 30 tablet 0    Sig: TAKE 1 TABLET BY [place]     Endocrinology:  Diabetes - SGLT2 Inhibitors Failed - 02/04/2022  4:35 PM      Failed - HBA1C is between 0 and 7.9 and within 180 days    Hgb A1c MFr Bld  Date Value Ref Range Status  01/31/2019 5.6 4.8 - 5.6 % Final    Comment:    (NOTE) Pre diabetes:          5.7%-6.4% Diabetes:              >6.4% Glycemic control for   <7.0% adults with diabetes          Passed - Cr in normal range and within 360 days    Creatinine, Ser  Date Value Ref Range Status  01/21/2022 0.80 0.61 - 1.24 mg/dL Final         Passed - eGFR in normal range and within 360 days    GFR calc Af Amer  Date Value Ref Range Status  01/08/2020 >60 >60 mL/min Final   GFR, Estimated  Date Value Ref Range Status  01/21/2022 >60 >60 mL/min Final    Comment:    (NOTE) Calculated using the CKD-EPI Creatinine Equation (2021)    eGFR  Date Value Ref Range Status  07/24/2021 97 >59 mL/min/1.73 Final         Passed - Valid encounter within last 6 months    Recent Outpatient Visits           2 months ago Acute pain of left knee   [facility] Jon Billings, NP   6 months ago Congestive heart failure, unspecified HF chronicity, unspecified heart failure type ([place])   [facility] Jon Billings, NP   9 months ago Annual physical exam   [facility] Jon Billings, NP   1 year ago Congestive heart failure, unspecified HF chronicity, unspecified heart failure type [facility])   [facility] Jon Billings, NP

## 2022-02-05 NOTE — Telephone Encounter (Signed)
Patient id due for chronic disease follow up. Please call to schedule and then route to provider for refill.

## 2022-02-10 ENCOUNTER — Telehealth: Payer: Self-pay | Admitting: Pharmacy Technician

## 2022-02-10 DIAGNOSIS — G4733 Obstructive sleep apnea (adult) (pediatric): Secondary | ICD-10-CM | POA: Diagnosis not present

## 2022-02-10 DIAGNOSIS — Z596 Low income: Secondary | ICD-10-CM

## 2022-02-18 ENCOUNTER — Other Ambulatory Visit: Payer: Self-pay | Admitting: Family

## 2022-02-18 MED ORDER — SPIRONOLACTONE 25 MG PO TABS: 25.0000 mg | ORAL_TABLET | Freq: Every day | ORAL | 3 refills | Status: DC

## 2022-02-20 ENCOUNTER — Ambulatory Visit: Payer: Medicare Other | Attending: Family | Admitting: Family

## 2022-02-20 ENCOUNTER — Encounter: Payer: Self-pay | Admitting: Nurse Practitioner

## 2022-02-20 ENCOUNTER — Encounter: Payer: Self-pay | Admitting: Family

## 2022-02-20 ENCOUNTER — Ambulatory Visit (INDEPENDENT_AMBULATORY_CARE_PROVIDER_SITE_OTHER): Payer: Medicare Other | Admitting: Nurse Practitioner

## 2022-02-20 VITALS — BP 113/74 | HR 84 | Resp 16 | Ht 69.0 in | Wt 346.5 lb

## 2022-02-20 VITALS — BP 128/85 | HR 66 | Temp 98.7°F | Wt 346.8 lb

## 2022-02-20 DIAGNOSIS — Z720 Tobacco use: Secondary | ICD-10-CM

## 2022-02-20 DIAGNOSIS — F419 Anxiety disorder, unspecified: Secondary | ICD-10-CM | POA: Diagnosis not present

## 2022-02-20 DIAGNOSIS — Z7901 Long term (current) use of anticoagulants: Secondary | ICD-10-CM | POA: Insufficient documentation

## 2022-02-20 DIAGNOSIS — R5383 Other fatigue: Secondary | ICD-10-CM | POA: Diagnosis not present

## 2022-02-20 DIAGNOSIS — M25411 Effusion, right shoulder: Secondary | ICD-10-CM

## 2022-02-20 DIAGNOSIS — Z9989 Dependence on other enabling machines and devices: Secondary | ICD-10-CM | POA: Diagnosis not present

## 2022-02-20 DIAGNOSIS — G4733 Obstructive sleep apnea (adult) (pediatric): Secondary | ICD-10-CM

## 2022-02-20 DIAGNOSIS — E781 Pure hyperglyceridemia: Secondary | ICD-10-CM

## 2022-02-20 DIAGNOSIS — F32A Depression, unspecified: Secondary | ICD-10-CM

## 2022-02-20 DIAGNOSIS — R531 Weakness: Secondary | ICD-10-CM | POA: Insufficient documentation

## 2022-02-20 DIAGNOSIS — R059 Cough, unspecified: Secondary | ICD-10-CM | POA: Diagnosis not present

## 2022-02-20 DIAGNOSIS — Z6841 Body Mass Index (BMI) 40.0 and over, adult: Secondary | ICD-10-CM

## 2022-02-20 DIAGNOSIS — I10 Essential (primary) hypertension: Secondary | ICD-10-CM

## 2022-02-20 DIAGNOSIS — R2230 Localized swelling, mass and lump, unspecified upper limb: Secondary | ICD-10-CM | POA: Diagnosis not present

## 2022-02-20 DIAGNOSIS — G8929 Other chronic pain: Secondary | ICD-10-CM | POA: Diagnosis not present

## 2022-02-20 DIAGNOSIS — R002 Palpitations: Secondary | ICD-10-CM | POA: Diagnosis not present

## 2022-02-20 DIAGNOSIS — Z79899 Other long term (current) drug therapy: Secondary | ICD-10-CM | POA: Insufficient documentation

## 2022-02-20 DIAGNOSIS — Z7984 Long term (current) use of oral hypoglycemic drugs: Secondary | ICD-10-CM | POA: Insufficient documentation

## 2022-02-20 DIAGNOSIS — I509 Heart failure, unspecified: Secondary | ICD-10-CM | POA: Diagnosis not present

## 2022-02-20 DIAGNOSIS — R7309 Other abnormal glucose: Secondary | ICD-10-CM

## 2022-02-20 DIAGNOSIS — I700 Atherosclerosis of aorta: Secondary | ICD-10-CM

## 2022-02-20 DIAGNOSIS — F17210 Nicotine dependence, cigarettes, uncomplicated: Secondary | ICD-10-CM | POA: Diagnosis not present

## 2022-02-20 DIAGNOSIS — I5022 Chronic systolic (congestive) heart failure: Secondary | ICD-10-CM | POA: Diagnosis not present

## 2022-02-20 DIAGNOSIS — R0602 Shortness of breath: Secondary | ICD-10-CM | POA: Diagnosis not present

## 2022-02-20 DIAGNOSIS — I110 Hypertensive heart disease with heart failure: Secondary | ICD-10-CM | POA: Diagnosis not present

## 2022-02-20 MED ORDER — DAPAGLIFLOZIN PROPANEDIOL 10 MG PO TABS: 10.0000 mg | ORAL_TABLET | Freq: Every day | ORAL | 4 refills | Status: DC

## 2022-02-20 NOTE — Assessment & Plan Note (Signed)
Chronic.  Controlled.  Continue with current medication regimen of ASA. Labs ordered today.  Return to clinic in 6 months for reevaluation.  Call sooner if concerns arise.

## 2022-02-20 NOTE — Assessment & Plan Note (Signed)
Recommend a healthy lifestyle through diet and exercise.  °

## 2022-02-20 NOTE — Progress Notes (Signed)
BP 128/85   Pulse 66   Temp 98.7 F (37.1 C) (Oral)   Wt (!) 346 lb 12.8 oz (157.3 kg)   SpO2 98%   BMI 51.21 kg/m    Subjective:    Patient ID: Marc [name], male    DOB: 04-08-1976, 46 y.o.   MRN: [id number]  HPI: Marc [name] is a 46 y.o. male  Chief Complaint  Patient presents with   Follow-up   Shoulder Pain    R shoulder pain x 1 month. States has fallen a few times over the last few months.    HYPERTENSION/HEART FAILURE Hypertension status: controlled  Satisfied with current treatment? no Duration of hypertension: years BP monitoring frequency:  not checking BP range:  BP medication side effects:  no Medication compliance: excellent compliance Previous BP meds: and Entresto, carvedilol, and spironalactone Aspirin: yes Recurrent headaches: no Visual changes: no Palpitations: no Dyspnea: no Chest pain: no Lower extremity edema: no Dizzy/lightheaded: no Patient saw HF clinic this morning.  States his ECHO results were similar to prior.    Patient is still having shoulder pain.  States he has a bone sticking out that causes him pain.  If he does too much or messes with it then it causes him to have pain.  Relevant past medical, surgical, family and social history reviewed and updated as indicated. Interim medical history since our last visit reviewed. Allergies and medications reviewed and updated.  Review of Systems  Eyes:  Negative for visual disturbance.  Respiratory:  Negative for chest tightness and shortness of breath.   Cardiovascular:  Negative for chest pain, palpitations and leg swelling.  Musculoskeletal:        Bone sticking out of right shoulder  Neurological:  Negative for dizziness, light-headedness and headaches.    Per HPI unless specifically indicated above     Objective:    BP 128/85   Pulse 66   Temp 98.7 F (37.1 C) (Oral)   Wt (!) 346 lb 12.8 oz (157.3 kg)   SpO2 98%   BMI 51.21 kg/m   Wt Readings from Last 3  Encounters:  02/20/22 (!) 346 lb 12.8 oz (157.3 kg)  02/20/22 (!) 346 lb 8 oz (157.2 kg)  01/21/22 (!) 350 lb 6 oz (158.9 kg)    Physical Exam Vitals and nursing note reviewed.  Constitutional:      General: He is not in acute distress.    Appearance: Normal appearance. He is obese. He is not ill-appearing, toxic-appearing or diaphoretic.  HENT:     Head: Normocephalic.     Right Ear: External ear normal.     Left Ear: External ear normal.     Nose: Nose normal. No congestion or rhinorrhea.     Mouth/Throat:     Mouth: Mucous membranes are moist.  Eyes:     General:        Right eye: No discharge.        Left eye: No discharge.     Extraocular Movements: Extraocular movements intact.     Conjunctiva/sclera: Conjunctivae normal.     Pupils: Pupils are equal, round, and reactive to light.   Cardiovascular:     Rate and Rhythm: Normal rate and regular rhythm.     Heart sounds: No murmur heard. Pulmonary:     Effort: Pulmonary effort is normal. No respiratory distress.     Breath sounds: Normal breath sounds. No wheezing, rhonchi or rales.  Abdominal:     General:  Abdomen is flat. Bowel sounds are normal.  Musculoskeletal:       Arms:     Cervical back: Normal range of motion and neck supple.  Feet:     Comments: Pain, swelling and warmth of left great toe Skin:    General: Skin is warm and dry.     Capillary Refill: Capillary refill takes less than 2 seconds.  Neurological:     General: No focal deficit present.     Mental Status: He is alert and oriented to person, place, and time.  Psychiatric:        Mood and Affect: Mood normal.        Behavior: Behavior normal.        Thought Content: Thought content normal.        Judgment: Judgment normal.     Results for orders placed or performed during the hospital encounter of 02/04/22  ECHOCARDIOGRAM COMPLETE  Result Value Ref Range   S' Lateral 6.40 cm      Assessment & Plan:   Problem List Items Addressed This  Visit       Cardiovascular and Mediastinum   Congestive heart failure (HCC)    Chronic. Followed by Cardiology.  - Reminded to call for an overnight weight gain of >2 pounds or a weekly weight gain of >5 pounds - not adding salt to food and read food labels. Reviewed the importance of keeping daily sodium intake to '2000mg'$  daily. - Avoid Ibuprofen products.       Hypertension - Primary    Chronic.  Controlled.  Continue with current medication regimen of Entresto, Carvdilol and Spirnolactone.  Followed by HF clinic. Labs ordered today.  Return to clinic in 6 months for reevaluation.  Call sooner if concerns arise.          Relevant Orders   Comp Met (CMET)   Aortic atherosclerosis (HCC)    Chronic.  Controlled.  Continue with current medication regimen of ASA. Labs ordered today.  Return to clinic in 6 months for reevaluation.  Call sooner if concerns arise.          Other   BMI 50.0-59.9, adult ([place])    Recommend a healthy lifestyle through diet and exercise.       Relevant Medications   dapagliflozin propanediol (FARXIGA) 10 MG TABS tablet   Other Visit Diagnoses     Hypertriglyceridemia       Labs ordered today. Will make recommendations based on lab results.   Relevant Orders   Lipid Profile   Mass of shoulder region       Relevant Orders   US Soft Tissue Head/Neck (NON-THYROID)        Follow up plan: Return in about 6 months (around 08/23/2022) for Physical and Fasting labs.

## 2022-02-20 NOTE — Assessment & Plan Note (Signed)
Chronic. Followed by Cardiology.  - Reminded to call for an overnight weight gain of >2 pounds or a weekly weight gain of >5 pounds - not adding salt to food and read food labels. Reviewed the importance of keeping daily sodium intake to '2000mg'$  daily. - Avoid Ibuprofen products.

## 2022-02-20 NOTE — Assessment & Plan Note (Addendum)
Chronic.  Controlled.  Continue with current medication regimen of Entresto, Carvdilol and Spirnolactone.  Followed by HF clinic. Labs ordered today.  Return to clinic in 6 months for reevaluation.  Call sooner if concerns arise.

## 2022-02-20 NOTE — Patient Instructions (Addendum)
Resume weighing daily and call for an overnight weight gain of 3 pounds or more or a weekly weight gain of more than 5 pounds.   If you have voicemail, please make sure your mailbox is cleaned out so that we may leave a message and please make sure to listen to any voicemails.     

## 2022-02-20 NOTE — Progress Notes (Signed)
Patient ID: Marc [name], male    DOB: May 18, 1976, 46 y.o.   MRN: [id number]   [name] is a 46 y/o male with a history of HTN, chronic pain, bipolar, current tobacco use and chronic heart failure.   Echo report from 02/04/22 reviewed and showed an EF of 20-25%. Echo report from 10/10/2018 reviewed and showed an EF of 20-25% along with moderate MR/TR.   RHC/LHC done 09/19/20 and showed: Severely depressed overall left ventricular function EF less than 25% Severely enlarged left ventricular chamber Left main large free of disease LAD was large and free of disease Circumflex was large and free of disease left dominant Right heart cath showed no evidence of pulmonary hypertension Mean PA was 23 mean wedge of 10 Cardiac output of 7.6 Fick  Was in the ED 10/09/21 due to left shoulder pain after lifting a heavy storage container. Evaluated and released.   He presents today for a follow-up visit with a chief complaint of moderate fatigue with minimal exertion. Describes this as chronic in nature. He has associated cough, shortness of breath, intermittent pedal edema, palpitations, headaches, leg weakness, chronic pain and a "knot" on his right shoulder along with this. Had an episode over the weekend where his vision blurred and he thinks he may have gotten too hot at a church function as it hasn't happened since that time. He denies any difficulty sleeping, abdominal distention, chest pain or dizziness.   Not weighing himself every day. His biggest concern is of this knot that has developed over his right shoulder area. Able to do ROM and says that it occasionally feels tender. No injury and no falls directly on that shoulder. Has used some ice which has helped some.   Past Medical History:  Diagnosis Date   CHF (congestive heart failure) (HCC)    Hypertension    Past Surgical History:  Procedure Laterality Date   BICEPS TENDON REPAIR     COLONOSCOPY WITH PROPOFOL N/A 10/09/2020    Procedure: COLONOSCOPY WITH PROPOFOL;  Surgeon: Marc Landsman, MD;  Location: [facility] ENDOSCOPY;  Service: Gastroenterology;  Laterality: N/A;   FLEXIBLE SIGMOIDOSCOPY N/A 10/30/2020   Procedure: FLEXIBLE SIGMOIDOSCOPY;  Surgeon: Marc Landsman, MD;  Location: [facility] ENDOSCOPY;  Service: Gastroenterology;  Laterality: N/A;   FOOT SURGERY     metataral and fasciotomy   RIGHT/LEFT HEART CATH AND CORONARY ANGIOGRAPHY Bilateral 09/19/2020   Procedure: RIGHT/LEFT HEART CATH AND CORONARY ANGIOGRAPHY;  Surgeon: Yolonda Kida, MD;  Location: [facility];  Service: Cardiovascular;  Laterality: Bilateral;   ROTATOR CUFF REPAIR     Family History  Problem Relation Age of Onset   Stroke Mother    Dementia Mother    Hypertension Father    Diabetes Father    Hypertension Paternal Grandfather    Diabetes Paternal Grandfather    Social History   Tobacco Use   Smoking status: Every Day    Packs/day: 0.50    Years: 25.00    Total pack years: 12.50    Types: Cigarettes   Smokeless tobacco: Never  Substance Use Topics   Alcohol use: Not Currently   Allergies  Allergen Reactions   Geodon [Ziprasidone] Other (See Comments)    paralysis   Tramadol Other (See Comments)    Pt stated that it gave him sores.   Amoxicillin Rash   Zithromax [Azithromycin] Rash   Prior to Admission medications   Medication Sig Start Date End Date Taking? Authorizing Provider  albuterol (  VENTOLIN HFA) 108 (90 Base) MCG/ACT inhaler Inhale 2 puffs into the lungs every 6 (six) hours as needed for wheezing or shortness of breath. 12/19/20  Yes Marc Billings, NP  aspirin EC 81 MG tablet Take 81 mg by mouth daily.   Yes [provider]  carvedilol (COREG) 6.25 MG tablet Take 1 tablet (6.25 mg total) by mouth 2 (two) times daily. 08/12/20  Yes [name], Otila Kluver A, FNP  cyclobenzaprine (FLEXERIL) 10 MG tablet Take 10 mg by mouth 3 (three) times daily. 10/16/21  Yes [provider]  docusate  sodium (COLACE) 100 MG capsule Take 100 mg by mouth daily as needed for mild constipation.   Yes [provider]  FARXIGA 10 MG TABS tablet TAKE 1 TABLET BY MOUTH ONCE DAILY BEFORE BREAKFAST 02/05/22  Yes [name], Marc P, DO  sacubitril-valsartan (ENTRESTO) 97-103 MG Take 1 tablet by mouth 2 (two) times daily. 10/16/21  Yes [name] A, FNP  spironolactone (ALDACTONE) 25 MG tablet Take 1 tablet (25 mg total) by mouth daily. 02/18/22  Yes [name] A, FNP  tiZANidine (ZANAFLEX) 4 MG tablet Take 4 mg by mouth at bedtime.   Yes [provider]  torsemide (DEMADEX) 20 MG tablet Take 40-80 mg by mouth daily. Taking 40 in the morning and 40 at night most days   Yes [provider]  allopurinol (ZYLOPRIM) 100 MG tablet Take 1 tablet (100 mg total) by mouth daily. Patient not taking: Reported on 01/21/2022 12/19/20   Marc Billings, NP    Review of Systems  Constitutional:  Positive for fatigue (tired "all the time"). Negative for appetite change.  HENT:  Negative for congestion, postnasal drip and sore throat.   Eyes:  Positive for visual disturbance ("in and out" last weekend).  Respiratory:  Positive for cough (at times) and shortness of breath. Negative for wheezing.   Cardiovascular:  Positive for palpitations (at times) and leg swelling (minimal). Negative for chest pain.  Gastrointestinal:  Negative for abdominal distention and abdominal pain.  Endocrine: Negative.   Genitourinary: Negative.   Musculoskeletal:  Positive for arthralgias (left knee), back pain, joint swelling (right shoulder knot) and neck pain.  Skin: Negative.   Allergic/Immunologic: Negative.   Neurological:  Positive for weakness (legs) and headaches. Negative for dizziness and light-headedness.  Hematological:  Negative for adenopathy. Does not bruise/bleed easily.  Psychiatric/Behavioral:  Negative for dysphoric mood and sleep disturbance (wearing CPAP). The patient is not nervous/anxious.     Vitals:   02/20/22 0851  BP: 113/74  Pulse: 84  Resp: 16  SpO2: 99%  Weight: (!) 346 lb 8 oz (157.2 kg)  Height: '5\' 9"'$  (1.753 m)   Wt Readings from Last 3 Encounters:  02/20/22 (!) 346 lb 8 oz (157.2 kg)  01/21/22 (!) 350 lb 6 oz (158.9 kg)  11/17/21 (!) 348 lb 12.8 oz (158.2 kg)   Lab Results  Component Value Date   CREATININE 0.80 01/21/2022   CREATININE 0.97 07/24/2021   CREATININE 0.86 04/24/2021   Physical Exam Vitals and nursing note reviewed.  HENT:     Head: Normocephalic and atraumatic.  Cardiovascular:     Rate and Rhythm: Normal rate and regular rhythm.  Pulmonary:     Effort: Pulmonary effort is normal.     Breath sounds: No wheezing or rales.     Comments: Diminished due to chest wall size Abdominal:     General: There is no distension.     Palpations: Abdomen is soft.  Tenderness: There is no abdominal tenderness.  Musculoskeletal:        General: No tenderness.     Right shoulder: Swelling (swelling appears bony in nature; not fluid filled) present. No tenderness.       Arms:     Cervical back: Normal range of motion and neck supple.     Right lower leg: No tenderness. No edema.     Left lower leg: No tenderness. No edema.  Skin:    General: Skin is warm and dry.  Neurological:     General: No focal deficit present.     Mental Status: He is alert and oriented to person, place, and time.  Psychiatric:        Attention and Perception: Attention normal.        Mood and Affect: Mood is not anxious or depressed.        Behavior: Behavior normal.     Assessment & Plan:  1. Chronic heart failure with reduced ejection fraction- - NYHA class III - euvolemic today - not weighing daily but does have scales; encouraged to resume weighing daily so that he can call for an overnight weight gain of >2 pounds or a weekly weight gain of >5 pounds - weight down 4 pounds from last visit here 1 month ago - trying to increase his activity with gardening and  riding his lawnmower - cooks with himalayan pink salt and reviewed sodium content of this; encouraged him to add very little to food when cooking as it still has high sodium content (~ '400mg'$ / 1/4 teaspoon); does not add any salt to the food after cooking - saw cardiology [name]) 01/27/22 - on GDMT of carvedilol, entresto, farxiga & spironolactone - reviewed recent echo results with him - BP limits further carvedilol titration - BNP 09/10/20 was 44.1  2: HTN- - BP looks great (113/74) - saw PCP [name]) 11/17/21 and returns later today - BMP from 01/21/22 reviewed and showed sodium 138, potassium 4.1, creatinine 0.80 and GFR >60  3: Anxiety/ depression- - patient says that previously he's talked to the therapist at [name] - brother suddenly died a month ago - appears to be under control at this time  4: Tobacco use- - smoking ~ 1/2 ppd of cigarettes - denies alcohol or drug use - complete cessation discussed for 3 minutes with the patient.   5: Obstructive sleep apnea- - saw pulmonology [name]) 11/18/20 - wearing his CPAP nightly & reports resting well  6: Right shoulder swelling- - says that this occurred ~ 1 month ago - no injury or direct fall to that area; has had rotator cuff surgery in the past - does have ROM and nontender to palpation - noticeable bony like swelling on anterior right shoulder; does not feel fluid filled  - he will discuss this further with PCP later today   Patient did not bring his medications nor a list. Each medication was verbally reviewed with the patient and he was encouraged to bring the bottles to every visit to confirm accuracy of list.   Return in 3 months, sooner if needed.

## 2022-02-21 LAB — COMPREHENSIVE METABOLIC PANEL
ALT: 13 IU/L (ref 0–44)
AST: 21 IU/L (ref 0–40)
Albumin/Globulin Ratio: 1.2 (ref 1.2–2.2)
Albumin: 4 g/dL (ref 4.0–5.0)
Alkaline Phosphatase: 113 IU/L (ref 44–121)
BUN/Creatinine Ratio: 10 (ref 9–20)
BUN: 10 mg/dL (ref 6–24)
Bilirubin Total: 0.2 mg/dL (ref 0.0–1.2)
CO2: 24 mmol/L (ref 20–29)
Calcium: 9.2 mg/dL (ref 8.7–10.2)
Chloride: 103 mmol/L (ref 96–106)
Creatinine, Ser: 1 mg/dL (ref 0.76–1.27)
Globulin, Total: 3.4 g/dL (ref 1.5–4.5)
Glucose: 134 mg/dL — ABNORMAL HIGH (ref 70–99)
Potassium: 3.8 mmol/L (ref 3.5–5.2)
Sodium: 141 mmol/L (ref 134–144)
Total Protein: 7.4 g/dL (ref 6.0–8.5)
eGFR: 93 mL/min/{1.73_m2} (ref >59)

## 2022-02-21 LAB — LIPID PANEL
Chol/HDL Ratio: 4.4 ratio (ref 0.0–5.0)
Cholesterol, Total: 197 mg/dL (ref 100–199)
HDL: 45 mg/dL (ref >39)
LDL Chol Calc (NIH): 114 mg/dL — ABNORMAL HIGH (ref 0–99)
Triglycerides: 218 mg/dL — ABNORMAL HIGH (ref 0–149)
VLDL Cholesterol Cal: 38 mg/dL (ref 5–40)

## 2022-02-23 NOTE — Progress Notes (Signed)
Please let patient know that his lab work shows that his cholesterol is elevated from prior.  I recommend decreasing processed foods and refined sugar intake.    His sugar is also elevated.  I'd like him to come back for a lab visit to check and A1c which is a measure of diabetes.    Otherwise, his lab work looks good.  No other concerns at this time.

## 2022-02-23 NOTE — Addendum Note (Signed)
Addended by: Jon Billings on: 02/23/2022 07:55 AM   Modules accepted: Orders

## 2022-02-24 ENCOUNTER — Other Ambulatory Visit: Payer: Medicare Other

## 2022-02-24 DIAGNOSIS — R7309 Other abnormal glucose: Secondary | ICD-10-CM | POA: Diagnosis not present

## 2022-02-25 LAB — HEMOGLOBIN A1C
Est. average glucose Bld gHb Est-mCnc: 131 mg/dL
Hgb A1c MFr Bld: 6.2 % — ABNORMAL HIGH (ref 4.8–5.6)

## 2022-02-25 NOTE — Progress Notes (Signed)
Please let patient know that his lab work shows that he is prediabetic.  He should continue the [place].  I recommend a low carb diet and exercise as tolerated.  We will continue to monitor this in the future.

## 2022-02-26 ENCOUNTER — Ambulatory Visit
Admission: RE | Admit: 2022-02-26 | Discharge: 2022-02-26 | Disposition: A | Discharge status: Home / Self Care | Payer: Medicare Other | Source: Ambulatory Visit | Attending: Nurse Practitioner | Admitting: Nurse Practitioner

## 2022-02-26 ENCOUNTER — Other Ambulatory Visit: Payer: Self-pay | Admitting: Nurse Practitioner

## 2022-02-26 DIAGNOSIS — R2230 Localized swelling, mass and lump, unspecified upper limb: Secondary | ICD-10-CM | POA: Diagnosis not present

## 2022-02-26 DIAGNOSIS — M6740 Ganglion, unspecified site: Secondary | ICD-10-CM

## 2022-02-26 DIAGNOSIS — M7989 Other specified soft tissue disorders: Secondary | ICD-10-CM | POA: Diagnosis not present

## 2022-02-27 NOTE — Progress Notes (Signed)
Please let patient know that his ultrasound shows that he has a cyst like mass likely a ganglion cyst on his arm.  If he would like to discuss removal I can place a referral to general surgery.

## 2022-02-27 NOTE — Progress Notes (Signed)
Referral placed.

## 2022-04-24 ENCOUNTER — Telehealth: Payer: Self-pay

## 2022-04-24 NOTE — Telephone Encounter (Signed)
Called and LVM asking for patient to please return my call to schedule his AWV with CMA's.

## 2022-04-30 NOTE — Progress Notes (Signed)
Pt canceled his appointment.  

## 2022-05-04 ENCOUNTER — Ambulatory Visit: Payer: Medicare Other | Admitting: Internal Medicine

## 2022-05-24 NOTE — Progress Notes (Unsigned)
Patient ID: Marc [name], male    DOB: 21-Jun-1976, 46 y.o.   MRN: [id number]   Marc [name] is a 46 y/o male with a history of HTN, chronic pain, bipolar, current tobacco use and chronic heart failure.   Echo report from 02/04/22 reviewed and showed an EF of 20-25%. Echo report from 10/10/2018 reviewed and showed an EF of 20-25% along with moderate Marc/TR.   RHC/LHC done 09/19/20 and showed: Severely depressed overall left ventricular function EF less than 25% Severely enlarged left ventricular chamber Left main large free of disease LAD was large and free of disease Circumflex was large and free of disease left dominant Right heart cath showed no evidence of pulmonary hypertension Mean PA was 23 mean wedge of 10 Cardiac output of 7.6 Fick  Was in the ED 10/09/21 due to left shoulder pain after lifting a heavy storage container. Evaluated and released.   He presents today for a follow-up visit with a chief complaint of moderate fatigue with minimal exertion. Describes this as chronic in nature. He has associated cough, shortness of breath, palpitations, headaches,weakness, difficulty falling asleep and chronic pain along with this. Denies any dizziness, abdominal distention, pedal edema, chest pain or wheezing.   Not weighing daily but does have working scales, says that he's just gotten out of the habit.   Eating out less but does eat out on Saturdays after church and this past Saturday he ate at a [place] place. On Saturdays, he doesn't take his morning dose of torsemide because of driving to [place] and then sitting in church. Takes his PM torsemide and spironolactone when he returns home in the evening. Does notice that on Sundays, he tends to feel more fatigued than the other days of the week.   Right shoulder cyst is much improved from last visit.    Past Medical History:  Diagnosis Date   CHF (congestive heart failure) (HCC)    Hypertension    Past Surgical History:  Procedure  Laterality Date   BICEPS TENDON REPAIR     COLONOSCOPY WITH PROPOFOL N/A 10/09/2020   Procedure: COLONOSCOPY WITH PROPOFOL;  Surgeon: Lin Landsman, MD;  Location: [facility] ENDOSCOPY;  Service: Gastroenterology;  Laterality: N/A;   FLEXIBLE SIGMOIDOSCOPY N/A 10/30/2020   Procedure: FLEXIBLE SIGMOIDOSCOPY;  Surgeon: Lin Landsman, MD;  Location: [facility] ENDOSCOPY;  Service: Gastroenterology;  Laterality: N/A;   FOOT SURGERY     metataral and fasciotomy   RIGHT/LEFT HEART CATH AND CORONARY ANGIOGRAPHY Bilateral 09/19/2020   Procedure: RIGHT/LEFT HEART CATH AND CORONARY ANGIOGRAPHY;  Surgeon: Yolonda Kida, MD;  Location: [place] [facility];  Service: Cardiovascular;  Laterality: Bilateral;   ROTATOR CUFF REPAIR     Family History  Problem Relation Age of Onset   Stroke Mother    Dementia Mother    Hypertension Father    Diabetes Father    Hypertension Paternal Grandfather    Diabetes Paternal Grandfather    Social History   Tobacco Use   Smoking status: Every Day    Packs/day: 0.50    Years: 25.00    Total pack years: 12.50    Types: Cigarettes   Smokeless tobacco: Never  Substance Use Topics   Alcohol use: Not Currently   Allergies  Allergen Reactions   Geodon [Ziprasidone] Other (See Comments)    paralysis   Tramadol Other (See Comments)    Pt stated that it gave him sores.   Amoxicillin Rash   Zithromax [Azithromycin] Rash  Prior to Admission medications   Medication Sig Start Date End Date Taking? Authorizing Provider  albuterol (VENTOLIN HFA) 108 (90 Base) MCG/ACT inhaler Inhale 2 puffs into the lungs every 6 (six) hours as needed for wheezing or shortness of breath. 12/19/20  Yes Jon Billings, NP  aspirin EC 81 MG tablet Take 81 mg by mouth daily.   Yes [provider]  carvedilol (COREG) 6.25 MG tablet Take 1 tablet (6.25 mg total) by mouth 2 (two) times daily. 08/12/20  Yes Boneta Standre, Otila Kluver A, FNP  cyclobenzaprine (FLEXERIL) 10 MG tablet Take 10  mg by mouth 3 (three) times daily. 10/16/21  Yes [provider]  dapagliflozin propanediol (FARXIGA) 10 MG TABS tablet Take 1 tablet (10 mg total) by mouth daily before breakfast. 02/20/22  Yes Jon Billings, NP  docusate sodium (COLACE) 100 MG capsule Take 100 mg by mouth daily as needed for mild constipation.   Yes [provider]  sacubitril-valsartan (ENTRESTO) 97-103 MG Take 1 tablet by mouth 2 (two) times daily. 10/16/21  Yes Darylene Price A, FNP  spironolactone (ALDACTONE) 25 MG tablet Take 1 tablet (25 mg total) by mouth daily. 02/18/22  Yes Kymberlyn Eckford A, FNP  torsemide (DEMADEX) 20 MG tablet Take 40-80 mg by mouth daily. Taking 40 in the morning and 40 at night most days   Yes [provider]   Review of Systems  Constitutional:  Positive for fatigue (tired "all the time"). Negative for appetite change.  HENT:  Negative for congestion, postnasal drip and sore throat.   Eyes: Negative.   Respiratory:  Positive for cough (at times) and shortness of breath. Negative for wheezing.   Cardiovascular:  Positive for palpitations (at times). Negative for chest pain and leg swelling.  Gastrointestinal:  Negative for abdominal distention and abdominal pain.  Endocrine: Negative.   Genitourinary: Negative.   Musculoskeletal:  Positive for arthralgias (left knee), back pain and neck pain.  Skin: Negative.   Allergic/Immunologic: Negative.   Neurological:  Positive for weakness (legs) and headaches. Negative for dizziness and light-headedness.  Hematological:  Negative for adenopathy. Does not bruise/bleed easily.  Psychiatric/Behavioral:  Positive for sleep disturbance (trouble falling asleep; wearing CPAP). Negative for dysphoric mood. The patient is not nervous/anxious.    Vitals:   05/25/22 0826  BP: 130/81  Pulse: 86  Resp: 20  SpO2: 100%  Weight: (!) 358 lb 4 oz (162.5 kg)  Height: '5\' 9"'$  (1.753 m)   Wt Readings from Last 3 Encounters:  05/25/22 (!) 358 lb  4 oz (162.5 kg)  02/20/22 (!) 346 lb 12.8 oz (157.3 kg)  02/20/22 (!) 346 lb 8 oz (157.2 kg)   Lab Results  Component Value Date   CREATININE 1.00 02/20/2022   CREATININE 0.80 01/21/2022   CREATININE 0.97 07/24/2021   Physical Exam Vitals and nursing note reviewed.  HENT:     Head: Normocephalic and atraumatic.  Cardiovascular:     Rate and Rhythm: Normal rate and regular rhythm.  Pulmonary:     Effort: Pulmonary effort is normal.     Breath sounds: No wheezing or rales.     Comments: Diminished due to chest wall size Abdominal:     General: There is no distension.     Palpations: Abdomen is soft.     Tenderness: There is no abdominal tenderness.  Musculoskeletal:        General: No tenderness.     Right shoulder: No tenderness.     Cervical back: Normal range of  motion and neck supple.     Right lower leg: No tenderness. No edema.     Left lower leg: No tenderness. No edema.  Skin:    General: Skin is warm and dry.  Neurological:     General: No focal deficit present.     Mental Status: He is alert and oriented to person, place, and time.  Psychiatric:        Attention and Perception: Attention normal.        Mood and Affect: Mood is not anxious or depressed.        Behavior: Behavior normal.     Assessment & Plan:  1. Chronic heart failure with reduced ejection fraction- - NYHA class III - euvolemic today - not weighing daily but does have scales; encouraged to resume weighing daily so that he can call for an overnight weight gain of >2 pounds or a weekly weight gain of >5 pounds - weight up 12 pounds from last visit here 3 months ago - cooks with himalayan pink salt and reviewed sodium content of this; encouraged him to add very little to food when cooking as it still has high sodium content (~ '400mg'$ / 1/4 teaspoon); does not add any salt to the food after cooking - eats out weekly on Saturdays after church and also doesn't take his AM torsemide that day; takes the  PM dose and spironolactone when he gets home but does notice that he's more fatigued on Sundays - saw cardiology Clayborn Bigness) 01/27/22; needs to call and schedule a f/u appointment - on GDMT of carvedilol, entresto, farxiga & spironolactone - consider titration if BP continues to look good; has been low in the past - BNP 09/10/20 was 44.1  2: HTN- - BP looks good (130/81) - saw PCP Mathis Dad) 02/20/22  - BMP from 02/20/22 reviewed and showed sodium 141, potassium 3.8, creatinine 1.0 and GFR 93  3: Anxiety/ depression- - patient says that previously he's talked to the therapist at [name] - appears to be under control at this time  4: Tobacco use- - smoking ~ 1/2 ppd of cigarettes - denies alcohol or drug use - complete cessation discussed for 3 minutes with the patient; discussed substituting cigarette with a sugar free lollipop or toothpick - he says that he's quit a couple of times before and has used toothpicks to help replace the cigarette  5: Obstructive sleep apnea- - saw pulmonology Humphrey Rolls) 11/18/20; NS 05/04/22 and advised him to contact them to r/s this appointment - wearing his CPAP nightly & reports resting well   Patient did not bring his medications nor a list. Each medication was verbally reviewed with the patient and he was encouraged to bring the bottles to every visit to confirm accuracy of list.   Return in 3 months, sooner if needed

## 2022-05-25 ENCOUNTER — Ambulatory Visit: Payer: Medicare Other | Attending: Family | Admitting: Family

## 2022-05-25 ENCOUNTER — Encounter: Payer: Self-pay | Admitting: Family

## 2022-05-25 VITALS — BP 130/81 | HR 86 | Resp 20 | Ht 69.0 in | Wt 358.2 lb

## 2022-05-25 DIAGNOSIS — I110 Hypertensive heart disease with heart failure: Secondary | ICD-10-CM | POA: Insufficient documentation

## 2022-05-25 DIAGNOSIS — Z7984 Long term (current) use of oral hypoglycemic drugs: Secondary | ICD-10-CM | POA: Diagnosis not present

## 2022-05-25 DIAGNOSIS — F319 Bipolar disorder, unspecified: Secondary | ICD-10-CM | POA: Diagnosis not present

## 2022-05-25 DIAGNOSIS — G4733 Obstructive sleep apnea (adult) (pediatric): Secondary | ICD-10-CM | POA: Diagnosis not present

## 2022-05-25 DIAGNOSIS — I10 Essential (primary) hypertension: Secondary | ICD-10-CM

## 2022-05-25 DIAGNOSIS — F17210 Nicotine dependence, cigarettes, uncomplicated: Secondary | ICD-10-CM | POA: Diagnosis not present

## 2022-05-25 DIAGNOSIS — F419 Anxiety disorder, unspecified: Secondary | ICD-10-CM

## 2022-05-25 DIAGNOSIS — Z720 Tobacco use: Secondary | ICD-10-CM | POA: Diagnosis not present

## 2022-05-25 DIAGNOSIS — I5022 Chronic systolic (congestive) heart failure: Secondary | ICD-10-CM | POA: Insufficient documentation

## 2022-05-25 DIAGNOSIS — F32A Depression, unspecified: Secondary | ICD-10-CM | POA: Diagnosis not present

## 2022-05-25 DIAGNOSIS — G8929 Other chronic pain: Secondary | ICD-10-CM | POA: Insufficient documentation

## 2022-05-25 NOTE — Patient Instructions (Addendum)
Resume weighing daily and call for an overnight weight gain of 3 pounds or more or a weekly weight gain of more than 5 pounds.   If you have voicemail, please make sure your mailbox is cleaned out so that we may leave a message and please make sure to listen to any voicemails.    Call Dr. Etta Quill (cardiology) to get an appointment scheduled [phone]   Call sleep clinic to reschedule your appointment

## 2022-05-26 ENCOUNTER — Ambulatory Visit (INDEPENDENT_AMBULATORY_CARE_PROVIDER_SITE_OTHER): Payer: Medicare Other | Admitting: *Deleted

## 2022-05-26 DIAGNOSIS — Z0000 Encounter for general adult medical examination without abnormal findings: Secondary | ICD-10-CM

## 2022-05-26 NOTE — Patient Instructions (Signed)
Marc [name] , Thank you for taking time to come for your Medicare Wellness Visit. I appreciate your ongoing commitment to your health goals. Please review the following plan we discussed and let me know if I can assist you in the future.   These are the goals we discussed:  Goals      Weight (lb) < 200 lb (90.7 kg)        This is a list of the screening recommended for you and due dates:  Health Maintenance  Topic Date Due   COVID-19 Vaccine (1) Never done   Colon Cancer Screening  10/09/2021   Flu Shot  03/17/2022   Tetanus Vaccine  05/27/2023*   Hepatitis C Screening: USPSTF Recommendation to screen - Ages 18-79 yo.  Completed   HIV Screening  Completed   HPV Vaccine  Aged Out  *Topic was postponed. The date shown is not the original due date.    Advanced directives:  Education provided  Conditions/risks identified: cardiac   Next appointment: Follow up in one year for your annual wellness visit  08-24-2022 @ 9:40 [place] Years, Male Preventive care refers to lifestyle choices and visits with your health care provider that can promote health and wellness. What does preventive care include? A yearly physical exam. This is also called an annual well check. Dental exams once or twice a year. Routine eye exams. Ask your health care provider how often you should have your eyes checked. Personal lifestyle choices, including: Daily care of your teeth and gums. Regular physical activity. Eating a healthy diet. Avoiding tobacco and drug use. Limiting alcohol use. Practicing safe sex. Taking low-dose aspirin every day starting at age 50. What happens during an annual well check? The services and screenings done by your health care provider during your annual well check will depend on your age, overall health, lifestyle risk factors, and family history of disease. Counseling  Your health care provider may ask you questions about your: Alcohol  use. Tobacco use. Drug use. Emotional well-being. Home and relationship well-being. Sexual activity. Eating habits. Work and work Statistician. Screening  You may have the following tests or measurements: Height, weight, and BMI. Blood pressure. Lipid and cholesterol levels. These may be checked every 5 years, or more frequently if you are over 48 years old. Skin check. Lung cancer screening. You may have this screening every year starting at age 97 if you have a 30-pack-year history of smoking and currently smoke or have quit within the past 15 years. Fecal occult blood test (FOBT) of the stool. You may have this test every year starting at age 35. Flexible sigmoidoscopy or colonoscopy. You may have a sigmoidoscopy every 5 years or a colonoscopy every 10 years starting at age 73. Prostate cancer screening. Recommendations will vary depending on your family history and other risks. Hepatitis C blood test. Hepatitis B blood test. Sexually transmitted disease (STD) testing. Diabetes screening. This is done by checking your blood sugar (glucose) after you have not eaten for a while (fasting). You may have this done every 1-3 years. Discuss your test results, treatment options, and if necessary, the need for more tests with your health care provider. Vaccines  Your health care provider may recommend certain vaccines, such as: Influenza vaccine. This is recommended every year. Tetanus, diphtheria, and acellular pertussis (Tdap, Td) vaccine. You may need a Td booster every 10 years. Zoster vaccine. You may need this after age 4. Pneumococcal 13-valent conjugate (PCV13) vaccine.  You may need this if you have certain conditions and have not been vaccinated. Pneumococcal polysaccharide (PPSV23) vaccine. You may need one or two doses if you smoke cigarettes or if you have certain conditions. Talk to your health care provider about which screenings and vaccines you need and how often you need  them. This information is not intended to replace advice given to you by your health care provider. Make sure you discuss any questions you have with your health care provider. Document Released: 08/30/2015 Document Revised: 04/22/2016 Document Reviewed: 06/04/2015 Elsevier Interactive Patient Education  2017 [place] Prevention in the Home Falls can cause injuries. They can happen to people of all ages. There are many things you can do to make your home safe and to help prevent falls. What can I do on the outside of my home? Regularly fix the edges of walkways and driveways and fix any cracks. Remove anything that might make you trip as you walk through a door, such as a raised step or threshold. Trim any bushes or trees on the path to your home. Use bright outdoor lighting. Clear any walking paths of anything that might make someone trip, such as rocks or tools. Regularly check to see if handrails are loose or broken. Make sure that both sides of any steps have handrails. Any raised decks and porches should have guardrails on the edges. Have any leaves, snow, or ice cleared regularly. Use sand or salt on walking paths during winter. Clean up any spills in your garage right away. This includes oil or grease spills. What can I do in the bathroom? Use night lights. Install grab bars by the toilet and in the tub and shower. Do not use towel bars as grab bars. Use non-skid mats or decals in the tub or shower. If you need to sit down in the shower, use a plastic, non-slip stool. Keep the floor dry. Clean up any water that spills on the floor as soon as it happens. Remove soap buildup in the tub or shower regularly. Attach bath mats securely with double-sided non-slip rug tape. Do not have throw rugs and other things on the floor that can make you trip. What can I do in the bedroom? Use night lights. Make sure that you have a light by your bed that is easy to reach. Do not use  any sheets or blankets that are too big for your bed. They should not hang down onto the floor. Have a firm chair that has side arms. You can use this for support while you get dressed. Do not have throw rugs and other things on the floor that can make you trip. What can I do in the kitchen? Clean up any spills right away. Avoid walking on wet floors. Keep items that you use a lot in easy-to-reach places. If you need to reach something above you, use a strong step stool that has a grab bar. Keep electrical cords out of the way. Do not use floor polish or wax that makes floors slippery. If you must use wax, use non-skid floor wax. Do not have throw rugs and other things on the floor that can make you trip. What can I do with my stairs? Do not leave any items on the stairs. Make sure that there are handrails on both sides of the stairs and use them. Fix handrails that are broken or loose. Make sure that handrails are as long as the stairways. Check any carpeting to  make sure that it is firmly attached to the stairs. Fix any carpet that is loose or worn. Avoid having throw rugs at the top or bottom of the stairs. If you do have throw rugs, attach them to the floor with carpet tape. Make sure that you have a light switch at the top of the stairs and the bottom of the stairs. If you do not have them, ask someone to add them for you. What else can I do to help prevent falls? Wear shoes that: Do not have high heels. Have rubber bottoms. Are comfortable and fit you well. Are closed at the toe. Do not wear sandals. If you use a stepladder: Make sure that it is fully opened. Do not climb a closed stepladder. Make sure that both sides of the stepladder are locked into place. Ask someone to hold it for you, if possible. Clearly mark and make sure that you can see: Any grab bars or handrails. First and last steps. Where the edge of each step is. Use tools that help you move around (mobility aids)  if they are needed. These include: Canes. Walkers. Scooters. Crutches. Turn on the lights when you go into a dark area. Replace any light bulbs as soon as they burn out. Set up your furniture so you have a clear path. Avoid moving your furniture around. If any of your floors are uneven, fix them. If there are any pets around you, be aware of where they are. Review your medicines with your doctor. Some medicines can make you feel dizzy. This can increase your chance of falling. Ask your doctor what other things that you can do to help prevent falls. This information is not intended to replace advice given to you by your health care provider. Make sure you discuss any questions you have with your health care provider. Document Released: 05/30/2009 Document Revised: 01/09/2016 Document Reviewed: 09/07/2014 Elsevier Interactive Patient Education  2017 [employer].

## 2022-05-26 NOTE — Progress Notes (Signed)
Subjective:   Marc [name] is a 46 y.o. male who presents for an Initial Medicare Annual Wellness Visit.  I connected with  Yareth L Bonaventura on 05/26/22 by a telephone enabled telemedicine application and verified that I am speaking with the correct person using two identifiers.   I discussed the limitations of evaluation and management by telemedicine. The patient expressed understanding and agreed to proceed.  Patient location: home  Provider location: Tele-health-home .   Review of Systems     Cardiac Risk Factors include: advanced age (>65mn, >>58women);family history of premature cardiovascular disease;male gender;hypertension;smoking/ tobacco exposure;obesity (BMI >30kg/m2);sedentary lifestyle     Objective:    Today's Vitals   There is no height or weight on file to calculate BMI.     05/26/2022   12:02 PM 10/09/2021    8:11 AM 10/30/2020    9:10 AM 10/09/2020    8:25 AM 01/29/2019    9:53 PM 01/29/2019    6:03 PM 10/11/2018   11:00 AM  Advanced Directives  Does Patient Have a Medical Advance Directive? No No No No No No No  Would patient like information on creating a medical advance directive? No - Patient declined No - Patient declined No - Patient declined  No - Patient declined  No - Patient declined    Current Medications (verified) Outpatient Encounter Medications as of 05/26/2022  Medication Sig   albuterol (VENTOLIN HFA) 108 (90 Base) MCG/ACT inhaler Inhale 2 puffs into the lungs every 6 (six) hours as needed for wheezing or shortness of breath.   aspirin EC 81 MG tablet Take 81 mg by mouth daily.   carvedilol (COREG) 6.25 MG tablet Take 1 tablet (6.25 mg total) by mouth 2 (two) times daily.   cyclobenzaprine (FLEXERIL) 10 MG tablet Take 10 mg by mouth 3 (three) times daily.   dapagliflozin propanediol (FARXIGA) 10 MG TABS tablet Take 1 tablet (10 mg total) by mouth daily before breakfast.   docusate sodium (COLACE) 100 MG capsule Take 100 mg by mouth  daily as needed for mild constipation.   sacubitril-valsartan (ENTRESTO) 97-103 MG Take 1 tablet by mouth 2 (two) times daily.   spironolactone (ALDACTONE) 25 MG tablet Take 1 tablet (25 mg total) by mouth daily.   torsemide (DEMADEX) 20 MG tablet Take 40-80 mg by mouth daily. Taking 40 in the morning and 40 at night most days   No facility-administered encounter medications on file as of 05/26/2022.    Allergies (verified) Geodon [ziprasidone], Tramadol, Amoxicillin, and Zithromax [azithromycin]   History: Past Medical History:  Diagnosis Date   CHF ([place]estive heart failure) ([place]    Hypertension    Past Surgical History:  Procedure Laterality Date   BICEPS TENDON REPAIR     COLONOSCOPY WITH PROPOFOL N/A 10/09/2020   Procedure: COLONOSCOPY WITH PROPOFOL;  Surgeon: VLin Landsman MD;  [facility];  Service: Gastroenterology;  Laterality: N/A;   FLEXIBLE SIGMOIDOSCOPY N/A 10/30/2020   Procedure: FLEXIBLE SIGMOIDOSCOPY;  Surgeon: VLin Landsman MD;  [facility];  Service: Gastroenterology;  Laterality: N/A;   FOOT SURGERY     metataral and fasciotomy   RIGHT/LEFT HEART CATH AND CORONARY ANGIOGRAPHY Bilateral 09/19/2020   Procedure: RIGHT/LEFT HEART CATH AND CORONARY ANGIOGRAPHY;  Surgeon: CYolonda Kida MD;  [facility];  Service: Cardiovascular;  Laterality: Bilateral;   ROTATOR CUFF REPAIR     Family History  Problem Relation Age of Onset   Stroke Mother  Dementia Mother    Hypertension Father    Diabetes Father    Hypertension Paternal Grandfather    Diabetes Paternal Grandfather    Social History   Socioeconomic History   Marital status: Married    Spouse name: Not on file   Number of children: Not on file   Years of education: Not on file   Highest education level: Not on file  Occupational History   Not on file  Tobacco Use   Smoking status: Every Day    Packs/day: 0.50    Years: 25.00    Total pack  years: 12.50    Types: Cigarettes   Smokeless tobacco: Never  Vaping Use   Vaping Use: Never used  Substance and Sexual Activity   Alcohol use: Not Currently   Drug use: Not Currently   Sexual activity: Yes  Other Topics Concern   Not on file  Social History Narrative   Not on file   Social Determinants of Health   Financial Resource Strain: Low Risk  (05/26/2022)   Overall Financial Resource Strain (CARDIA)    Difficulty of Paying Living Expenses: Not hard at all  Food Insecurity: No Food Insecurity (05/26/2022)   Hunger Vital Sign    Worried About Running Out of Food in the Last Year: Never true    Ran Out of Food in the Last Year: Never true  Transportation Needs: No Transportation Needs (07/19/2019)   PRAPARE - Hydrologist (Medical): No    Lack of Transportation (Non-Medical): No  Physical Activity: Inactive (05/26/2022)   Exercise Vital Sign    Days of Exercise per Week: 0 days    Minutes of Exercise per Session: 0 min  Stress: Stress Concern Present (05/26/2022)   [place]    Feeling of Stress : To some extent  Social Connections: Moderately Integrated (05/26/2022)   Social Connection and Isolation Panel [NHANES]    Frequency of Communication with Friends and Family: Once a week    Frequency of Social Gatherings with Friends and Family: Twice a week    Attends Religious Services: More than 4 times per year    Active Member of [employer] or Organizations: No    Attends Music therapist: Never    Marital Status: Married    Tobacco Counseling Ready to quit: Not Answered Counseling given: Not Answered   Clinical Intake:  Pre-visit preparation completed: Yes  Pain : No/denies pain     Diabetes: No  How often do you need to have someone help you when you read instructions, pamphlets, or other written materials from your doctor or pharmacy?: 1 -  Never  Diabetic?  no  Interpreter Needed?: No  Information entered by :: Leroy Kennedy LPN   Activities of Daily Living    05/26/2022   12:12 PM 05/25/2022    8:31 AM  In your present state of health, do you have any difficulty performing the following activities:  Hearing? 0 0  Vision? 0 0  Difficulty concentrating or making decisions? 0 0  Walking or climbing stairs? 1 1  Dressing or bathing? 0 0  Doing errands, shopping? 1 0  Preparing Food and eating ? N   Using the Toilet? N   In the past six months, have you accidently leaked urine? N   Do you have problems with loss of bowel control? N   Managing your Medications? N   Managing your Finances?  N   Housekeeping or managing your Housekeeping? N     Patient Care Team: Jon Billings, NP as PCP - General (Nurse Practitioner)  Indicate any recent Medical Services you may have received from other than [facility] providers in the past year (date may be approximate).     Assessment:   This is a routine wellness examination for [place].  Hearing/Vision screen No results found.  Dietary issues and exercise activities discussed: Current Exercise Habits: The patient does not participate in regular exercise at present, Exercise limited by: cardiac condition(s)   Goals Addressed             This Visit's Progress    Weight (lb) < 200 lb (90.7 kg)         Depression Screen    05/26/2022   12:07 PM 05/25/2022    8:30 AM 02/20/2022    9:00 AM 01/21/2022    9:01 AM 11/17/2021   11:02 AM 10/24/2021    9:06 AM 07/24/2021    9:15 AM  PHQ 2/9 Scores  PHQ - 2 Score 1 1 0 0 1 0 3  PHQ- 9 Score '3    8  10    '$ Fall Risk    05/26/2022   12:02 PM 05/25/2022    8:28 AM 02/20/2022    8:58 AM 01/21/2022    9:01 AM 11/17/2021   11:02 AM  Fall Risk   Falls in the past year? 1 0 '1 1 1  '$ Number falls in past yr: 1 0 '1 1 1  '$ Injury with Fall? 1 0 '1 1 1  '$ Risk for fall due to : Impaired balance/gait No Fall Risks History of fall(s);Impaired  balance/gait;Impaired mobility History of fall(s);Impaired balance/gait;Impaired mobility History of fall(s)  Follow up Falls evaluation completed;Education provided;Falls prevention discussed Falls evaluation completed Falls evaluation completed;Education provided;Falls prevention discussed Falls evaluation completed;Education provided;Falls prevention discussed Falls evaluation completed    FALL RISK PREVENTION PERTAINING TO THE HOME:  Any stairs in or around the home? No  If so, are there any without handrails? No  Home free of loose throw rugs in walkways, pet beds, electrical cords, etc? Yes  Adequate lighting in your home to reduce risk of falls? Yes   ASSISTIVE DEVICES UTILIZED TO PREVENT FALLS:  Life alert? No  Use of a cane, walker or w/c? Yes  Grab bars in the bathroom? No  Shower chair or bench in shower? yes Elevated toilet seat or a handicapped toilet? No   TIMED UP AND GO:  Was the test performed? No .    Cognitive Function:    04/28/2021    5:32 PM  MMSE - Mini Mental State Exam  Orientation to time 5  Orientation to Place 5  Registration 3  Attention/ Calculation 5  Recall 3  Language- name 2 objects 2  Language- repeat 1  Language- follow 3 step command 3  Language- read & follow direction 1  Write a sentence 1  Copy design 1  Total score 30        05/26/2022   12:04 PM  6CIT Screen  What Year? 0 points  What month? 0 points  What time? 0 points  Count back from 20 0 points  Months in reverse 0 points  Repeat phrase 0 points  Total Score 0 points    Immunizations Immunization History  Administered Date(s) Administered   Influenza, Seasonal, Injecte, Preservative Fre 07/10/2014   Influenza,inj,Quad PF,6+ Mos 10/10/2018, 04/24/2021   Influenza-Unspecified 07/29/2011  Pneumococcal Polysaccharide-23 08/17/2018    TDAP status: Due, Education has been provided regarding the importance of this vaccine. Advised may receive this vaccine at  local pharmacy or Health Dept. Aware to provide a copy of the vaccination record if obtained from local pharmacy or Health Dept. Verbalized acceptance and understanding.  Flu Vaccine status: Due, Education has been provided regarding the importance of this vaccine. Advised may receive this vaccine at local pharmacy or Health Dept. Aware to provide a copy of the vaccination record if obtained from local pharmacy or Health Dept. Verbalized acceptance and understanding.  Pneumococcal vaccine status: Up to date  Covid-19 vaccine status: Information provided on how to obtain vaccines.   Qualifies for Shingles Vaccine?   Zostavax completed     Screening Tests Health Maintenance  Topic Date Due   COVID-19 Vaccine (1) Never done   COLONOSCOPY (Pts 45-47yr Insurance coverage will need to be confirmed)  10/09/2021   INFLUENZA VACCINE  03/17/2022   TETANUS/TDAP  05/27/2023 (Originally 07/14/1995)   Hepatitis C Screening  Completed   HIV Screening  Completed   HPV VACCINES  Aged Out    Health Maintenance  Health Maintenance Due  Topic Date Due   COVID-19 Vaccine (1) Never done   COLONOSCOPY (Pts 45-438yrInsurance coverage will need to be confirmed)  10/09/2021   INFLUENZA VACCINE  03/17/2022    Colorectal cancer screening: Type of screening: Colonoscopy. Completed 2022. Repeat every 3 years  Lung Cancer Screening: (Low Dose CT Chest recommended if Age 226-80ears, 30 pack-year currently smoking OR have quit w/in 15years.) does not qualify.   Lung Cancer Screening Referral:   Additional Screening:  Hepatitis C Screening: does not qualify; Completed 2022  Vision Screening: Recommended annual ophthalmology exams for early detection of glaucoma and other disorders of the eye. Is the patient up to date with their annual eye exam?  No  Who is the provider or what is the name of the office in which the patient attends annual eye exams?  If pt is not established with a provider, would  they like to be referred to a provider to establish care? No .   Dental Screening: Recommended annual dental exams for proper oral hygiene  Community Resource Referral / Chronic Care Management: CRR required this visit?  No   CCM required this visit?  No      Plan:     I have personally reviewed and noted the following in the patient's chart:   Medical and social history Use of alcohol, tobacco or illicit drugs  Current medications and supplements including opioid prescriptions. Patient is not currently taking opioid prescriptions. Functional ability and status Nutritional status Physical activity Advanced directives List of other physicians Hospitalizations, surgeries, and ER visits in previous 12 months Vitals Screenings to include cognitive, depression, and falls Referrals and appointments  In addition, I have reviewed and discussed with patient certain preventive protocols, quality metrics, and best practice recommendations. A written personalized care plan for preventive services as well as general preventive health recommendations were provided to patient.     JuLeroy KennedyLPN   [id number] Nurse Notes:

## 2022-07-16 ENCOUNTER — Other Ambulatory Visit: Payer: Self-pay | Admitting: Nurse Practitioner

## 2022-07-16 MED ORDER — DAPAGLIFLOZIN PROPANEDIOL 10 MG PO TABS: 10.0000 mg | ORAL_TABLET | Freq: Every day | ORAL | 0 refills | Status: DC

## 2022-07-16 NOTE — Telephone Encounter (Signed)
Requested Prescriptions  Pending Prescriptions Disp Refills   dapagliflozin propanediol (FARXIGA) 10 MG TABS tablet 90 tablet 0    Sig: Take 1 tablet (10 mg total) by mouth daily before breakfast.     Endocrinology:  Diabetes - SGLT2 Inhibitors Passed - 07/16/2022 11:58 AM      Passed - Cr in normal range and within 360 days    Creatinine, Ser  Date Value Ref Range Status  02/20/2022 1.00 0.76 - 1.27 mg/dL Final         Passed - HBA1C is between 0 and 7.9 and within 180 days    Hgb A1c MFr Bld  Date Value Ref Range Status  02/24/2022 6.2 (H) 4.8 - 5.6 % Final    Comment:             Prediabetes: 5.7 - 6.4          Diabetes: >6.4          Glycemic control for adults with diabetes: <7.0          Passed - eGFR in normal range and within 360 days    GFR calc Af Amer  Date Value Ref Range Status  01/08/2020 >60 >60 mL/min Final   GFR, Estimated  Date Value Ref Range Status  01/21/2022 >60 >60 mL/min Final    Comment:    (NOTE) Calculated using the CKD-EPI Creatinine Equation (2021)    eGFR  Date Value Ref Range Status  02/20/2022 93 >59 mL/min/1.73 Final         Passed - Valid encounter within last 6 months    Recent Outpatient Visits           4 months ago Primary hypertension   [facility] Jon Billings, NP   8 months ago Acute pain of left knee   [facility] Jon Billings, NP   11 months ago Congestive heart failure, unspecified HF chronicity, unspecified heart failure type ([place])   [place], Karen, NP   1 year ago Annual physical exam   [facility] Jon Billings, NP   1 year ago Congestive heart failure, unspecified HF chronicity, unspecified heart failure type ([place])   [facility] Jon Billings, NP       Future Appointments             In 1 month Jon Billings, NP [facility]             sacubitril-valsartan (ENTRESTO) 97-103 MG 60  tablet 5    Sig: Take 1 tablet by mouth 2 (two) times daily.     Off-Protocol Failed - 07/16/2022 11:58 AM      Failed - Medication not assigned to a protocol, review manually.      Passed - Valid encounter within last 12 months    Recent Outpatient Visits           4 months ago Primary hypertension   [facility] Jon Billings, NP   8 months ago Acute pain of left knee   [facility] Jon Billings, NP   11 months ago Congestive heart failure, unspecified HF chronicity, unspecified heart failure type [facility])   [facility] Jon Billings, NP   1 year ago Annual physical exam   [facility] Jon Billings, NP   1 year ago Congestive heart failure, unspecified HF chronicity, unspecified heart failure type [facility])   [facility] Jon Billings, NP  Future Appointments             In 1 month Jon Billings, NP [facility] [place]

## 2022-07-16 NOTE — Telephone Encounter (Signed)
Requested medication (s) are due for refill today: routing for review  Requested medication (s) are on the active medication list: yes  Last refill:  10/16/21  Future visit scheduled: yes  Notes to clinic:Unable to refill per protocol, last refill by another provider. Routing for review.     Requested Prescriptions  Pending Prescriptions Disp Refills   sacubitril-valsartan (ENTRESTO) 97-103 MG 60 tablet 5    Sig: Take 1 tablet by mouth 2 (two) times daily.     Off-Protocol Failed - 07/16/2022 11:58 AM      Failed - Medication not assigned to a protocol, review manually.      Passed - Valid encounter within last 12 months    Recent Outpatient Visits           4 months ago Primary hypertension   [facility] Jon Billings, NP   8 months ago Acute pain of left knee   [facility] Jon Billings, NP   11 months ago Congestive heart failure, unspecified HF chronicity, unspecified heart failure type ([place])   [facility] Jon Billings, NP   1 year ago Annual physical exam   [facility] Jon Billings, NP   1 year ago Congestive heart failure, unspecified HF chronicity, unspecified heart failure type ([place])   [facility] Jon Billings, NP       Future Appointments             In 1 month Jon Billings, NP [facility]            Signed Prescriptions Disp Refills   dapagliflozin propanediol (FARXIGA) 10 MG TABS tablet 90 tablet 0    Sig: Take 1 tablet (10 mg total) by mouth daily before breakfast.     Endocrinology:  Diabetes - SGLT2 Inhibitors Passed - 07/16/2022 11:58 AM      Passed - Cr in normal range and within 360 days    Creatinine, Ser  Date Value Ref Range Status  02/20/2022 1.00 0.76 - 1.27 mg/dL Final         Passed - HBA1C is between 0 and 7.9 and within 180 days    Hgb A1c MFr Bld  Date Value Ref Range Status  02/24/2022 6.2 (H) 4.8 - 5.6 % Final     Comment:             Prediabetes: 5.7 - 6.4          Diabetes: >6.4          Glycemic control for adults with diabetes: <7.0          Passed - eGFR in normal range and within 360 days    GFR calc Af Amer  Date Value Ref Range Status  01/08/2020 >60 >60 mL/min Final   GFR, Estimated  Date Value Ref Range Status  01/21/2022 >60 >60 mL/min Final    Comment:    (NOTE) Calculated using the CKD-EPI Creatinine Equation (2021)    eGFR  Date Value Ref Range Status  02/20/2022 93 >59 mL/min/1.73 Final         Passed - Valid encounter within last 6 months    Recent Outpatient Visits           4 months ago Primary hypertension   [facility] Jon Billings, NP   8 months ago Acute pain of left knee   [facility] Jon Billings, NP   11 months ago Congestive heart failure, unspecified HF chronicity,  unspecified heart failure type [facility])   [facility] Jon Billings, NP   1 year ago Annual physical exam   [facility] Jon Billings, NP   1 year ago Congestive heart failure, unspecified HF chronicity, unspecified heart failure type [facility])   [facility] Jon Billings, NP       Future Appointments             In 1 month Jon Billings, NP [facility], [place]

## 2022-07-16 NOTE — Telephone Encounter (Signed)
Medication Refill - Medication:  dapagliflozin propanediol (FARXIGA) 10 MG TABS tablet   sacubitril-valsartan (ENTRESTO) 97-103 MG   Has the patient contacted their pharmacy? No.   Preferred Pharmacy (with phone number or street name):  [facility] [place], [place] Phone: [phone]  Fax: [phone]      Has the patient been seen for an appointment in the last year OR does the patient have an upcoming appointment? Yes.     Pt [facility] Rx switch [facility] due to financial reasons

## 2022-08-05 ENCOUNTER — Emergency Department: Payer: Medicare Other

## 2022-08-05 ENCOUNTER — Encounter: Payer: Self-pay | Admitting: Emergency Medicine

## 2022-08-05 ENCOUNTER — Other Ambulatory Visit: Payer: Self-pay

## 2022-08-05 ENCOUNTER — Observation Stay
Admission: EM | Admit: 2022-08-05 | Discharge: 2022-08-06 | Disposition: A | Discharge status: Home / Self Care | Payer: Medicare Other | Attending: Internal Medicine | Admitting: Internal Medicine

## 2022-08-05 DIAGNOSIS — I5022 Chronic systolic (congestive) heart failure: Secondary | ICD-10-CM

## 2022-08-05 DIAGNOSIS — J810 Acute pulmonary edema: Secondary | ICD-10-CM | POA: Diagnosis not present

## 2022-08-05 DIAGNOSIS — Z6841 Body Mass Index (BMI) 40.0 and over, adult: Secondary | ICD-10-CM

## 2022-08-05 DIAGNOSIS — R079 Chest pain, unspecified: Secondary | ICD-10-CM | POA: Diagnosis present

## 2022-08-05 DIAGNOSIS — Z7984 Long term (current) use of oral hypoglycemic drugs: Secondary | ICD-10-CM | POA: Insufficient documentation

## 2022-08-05 DIAGNOSIS — R0789 Other chest pain: Secondary | ICD-10-CM | POA: Diagnosis not present

## 2022-08-05 DIAGNOSIS — Z79899 Other long term (current) drug therapy: Secondary | ICD-10-CM | POA: Diagnosis not present

## 2022-08-05 DIAGNOSIS — M109 Gout, unspecified: Secondary | ICD-10-CM | POA: Diagnosis present

## 2022-08-05 DIAGNOSIS — I110 Hypertensive heart disease with heart failure: Secondary | ICD-10-CM | POA: Diagnosis not present

## 2022-08-05 DIAGNOSIS — I428 Other cardiomyopathies: Secondary | ICD-10-CM

## 2022-08-05 DIAGNOSIS — Z7982 Long term (current) use of aspirin: Secondary | ICD-10-CM | POA: Diagnosis not present

## 2022-08-05 DIAGNOSIS — I10 Essential (primary) hypertension: Secondary | ICD-10-CM | POA: Diagnosis present

## 2022-08-05 DIAGNOSIS — J811 Chronic pulmonary edema: Secondary | ICD-10-CM | POA: Diagnosis not present

## 2022-08-05 DIAGNOSIS — I5023 Acute on chronic systolic (congestive) heart failure: Secondary | ICD-10-CM | POA: Diagnosis not present

## 2022-08-05 DIAGNOSIS — R0602 Shortness of breath: Secondary | ICD-10-CM | POA: Diagnosis not present

## 2022-08-05 DIAGNOSIS — F17210 Nicotine dependence, cigarettes, uncomplicated: Secondary | ICD-10-CM | POA: Diagnosis not present

## 2022-08-05 DIAGNOSIS — Z1152 Encounter for screening for COVID-19: Secondary | ICD-10-CM | POA: Insufficient documentation

## 2022-08-05 DIAGNOSIS — G4733 Obstructive sleep apnea (adult) (pediatric): Secondary | ICD-10-CM

## 2022-08-05 LAB — CBC
HCT: 42 % (ref 39.0–52.0)
Hemoglobin: 12.6 g/dL — ABNORMAL LOW (ref 13.0–17.0)
MCH: 22.5 pg — ABNORMAL LOW (ref 26.0–34.0)
MCHC: 30 g/dL (ref 30.0–36.0)
MCV: 74.9 fL — ABNORMAL LOW (ref 80.0–100.0)
Platelets: 265 10*3/uL (ref 150–400)
RBC: 5.61 MIL/uL (ref 4.22–5.81)
RDW: 17.1 % — ABNORMAL HIGH (ref 11.5–15.5)
WBC: 10.1 10*3/uL (ref 4.0–10.5)
nRBC: 0 % (ref 0.0–0.2)

## 2022-08-05 LAB — BRAIN NATRIURETIC PEPTIDE: B Natriuretic Peptide: 93 pg/mL (ref 0.0–100.0)

## 2022-08-05 LAB — RESP PANEL BY RT-PCR (FLU A&B, COVID) ARPGX2
Influenza A by PCR: NEGATIVE
Influenza B by PCR: NEGATIVE
SARS Coronavirus 2 by RT PCR: NEGATIVE

## 2022-08-05 LAB — BASIC METABOLIC PANEL
Anion gap: 9 (ref 5–15)
BUN: 15 mg/dL (ref 6–20)
CO2: 27 mmol/L (ref 22–32)
Calcium: 8.9 mg/dL (ref 8.9–10.3)
Chloride: 103 mmol/L (ref 98–111)
Creatinine, Ser: 1.05 mg/dL (ref 0.61–1.24)
GFR, Estimated: >60 mL/min (ref >60)
Glucose, Bld: 109 mg/dL — ABNORMAL HIGH (ref 70–99)
Potassium: 3.8 mmol/L (ref 3.5–5.1)
Sodium: 139 mmol/L (ref 135–145)

## 2022-08-05 LAB — D-DIMER, QUANTITATIVE: D-Dimer, Quant: 0.46 ug/mL-FEU (ref 0.00–0.50)

## 2022-08-05 LAB — TROPONIN I (HIGH SENSITIVITY)
Troponin I (High Sensitivity): 15 ng/L (ref <18)
Troponin I (High Sensitivity): 21 ng/L — ABNORMAL HIGH (ref <18)

## 2022-08-05 MED ORDER — SPIRONOLACTONE 25 MG PO TABS
25.0000 mg | ORAL_TABLET | Freq: Every day | ORAL | Status: DC
  Administered 2022-08-06: 25 mg via ORAL
  Filled 2022-08-05: qty 1

## 2022-08-05 MED ORDER — ASPIRIN 81 MG PO TBEC
81.0000 mg | DELAYED_RELEASE_TABLET | Freq: Every day | ORAL | Status: DC
  Administered 2022-08-06: 81 mg via ORAL
  Filled 2022-08-05: qty 1

## 2022-08-05 MED ORDER — ACETAMINOPHEN 325 MG PO TABS: 650.0000 mg | ORAL_TABLET | Freq: Four times a day (QID) | ORAL | Status: DC | PRN

## 2022-08-05 MED ORDER — SACUBITRIL-VALSARTAN 97-103 MG PO TABS
1.0000 | ORAL_TABLET | Freq: Two times a day (BID) | ORAL | Status: DC
  Administered 2022-08-06 (×2): 1 via ORAL
  Filled 2022-08-05 (×2): qty 1

## 2022-08-05 MED ORDER — HYDROCODONE-ACETAMINOPHEN 5-325 MG PO TABS
1.0000 | ORAL_TABLET | ORAL | Status: DC | PRN
  Administered 2022-08-06: 2 via ORAL
  Filled 2022-08-05: qty 2

## 2022-08-05 MED ORDER — ONDANSETRON HCL 4 MG PO TABS: 4.0000 mg | ORAL_TABLET | Freq: Four times a day (QID) | ORAL | Status: DC | PRN

## 2022-08-05 MED ORDER — MORPHINE SULFATE (PF) 4 MG/ML IV SOLN
4.0000 mg | Freq: Once | INTRAVENOUS | Status: AC
  Administered 2022-08-05: 4 mg via INTRAVENOUS
  Filled 2022-08-05: qty 1

## 2022-08-05 MED ORDER — DAPAGLIFLOZIN PROPANEDIOL 10 MG PO TABS
10.0000 mg | ORAL_TABLET | Freq: Every day | ORAL | Status: DC
  Administered 2022-08-06: 10 mg via ORAL
  Filled 2022-08-05 (×2): qty 1

## 2022-08-05 MED ORDER — MORPHINE SULFATE (PF) 2 MG/ML IV SOLN
2.0000 mg | INTRAVENOUS | Status: DC | PRN
  Administered 2022-08-05: 2 mg via INTRAVENOUS
  Filled 2022-08-05: qty 1

## 2022-08-05 MED ORDER — ONDANSETRON HCL 4 MG/2ML IJ SOLN: 4.0000 mg | Freq: Four times a day (QID) | INTRAMUSCULAR | Status: DC | PRN

## 2022-08-05 MED ORDER — ACETAMINOPHEN 650 MG RE SUPP: 650.0000 mg | Freq: Four times a day (QID) | RECTAL | Status: DC | PRN

## 2022-08-05 MED ORDER — CARVEDILOL 6.25 MG PO TABS
6.2500 mg | ORAL_TABLET | Freq: Two times a day (BID) | ORAL | Status: DC
  Administered 2022-08-06: 6.25 mg via ORAL
  Filled 2022-08-05: qty 1

## 2022-08-05 MED ORDER — FUROSEMIDE 10 MG/ML IJ SOLN
40.0000 mg | Freq: Two times a day (BID) | INTRAMUSCULAR | Status: DC
  Administered 2022-08-06: 40 mg via INTRAVENOUS
  Filled 2022-08-05: qty 4

## 2022-08-05 MED ORDER — ENOXAPARIN SODIUM 80 MG/0.8ML IJ SOSY
0.5000 mg/kg | PREFILLED_SYRINGE | INTRAMUSCULAR | Status: DC
  Administered 2022-08-06: 80 mg via SUBCUTANEOUS
  Filled 2022-08-05 (×2): qty 0.8

## 2022-08-05 MED ORDER — ALBUTEROL SULFATE (2.5 MG/3ML) 0.083% IN NEBU
2.5000 mg | INHALATION_SOLUTION | Freq: Four times a day (QID) | RESPIRATORY_TRACT | Status: DC | PRN
  Administered 2022-08-06: 2.5 mg via RESPIRATORY_TRACT
  Filled 2022-08-05: qty 3

## 2022-08-05 MED ORDER — ASPIRIN 81 MG PO CHEW
324.0000 mg | CHEWABLE_TABLET | Freq: Once | ORAL | Status: AC
  Administered 2022-08-05: 324 mg via ORAL
  Filled 2022-08-05: qty 4

## 2022-08-05 MED ORDER — NITROGLYCERIN 2 % TD OINT
0.5000 [in_us] | TOPICAL_OINTMENT | TRANSDERMAL | Status: AC
  Administered 2022-08-05: 0.5 [in_us] via TOPICAL
  Filled 2022-08-05: qty 1

## 2022-08-05 MED ORDER — FUROSEMIDE 10 MG/ML IJ SOLN
40.0000 mg | Freq: Once | INTRAMUSCULAR | Status: AC
  Administered 2022-08-05: 40 mg via INTRAVENOUS
  Filled 2022-08-05: qty 4

## 2022-08-05 NOTE — ED Notes (Signed)
Patient provided with warm blanket per request.  

## 2022-08-05 NOTE — Assessment & Plan Note (Signed)
Continue carvedilol and spironolactone

## 2022-08-05 NOTE — Assessment & Plan Note (Signed)
CPAP nightly

## 2022-08-05 NOTE — Assessment & Plan Note (Addendum)
Typical chest pain with troponin 15-21, nonischemic EKG history of right and left heart cath 09/2020 that showed clean coronaries Suspecting demand ischemia but we will continue to trend troponin to peak Continue Nitropaste, carvedilol Continue aspirin Echo to evaluate for segmental wall motion abnormality Cardiology consult

## 2022-08-05 NOTE — Progress Notes (Signed)
Anticoagulation monitoring(Lovenox):  46 yo male ordered Lovenox 40 mg Q24h    Filed Weights   08/05/22 1442  Weight: (!) 157.9 kg (348 lb)   BMI 51.4    Lab Results  Component Value Date   CREATININE 1.05 08/05/2022   CREATININE 1.00 02/20/2022   CREATININE 0.80 01/21/2022   Estimated Creatinine Clearance: 128.5 mL/min (by C-G formula based on SCr of 1.05 mg/dL). Hemoglobin & Hematocrit     Component Value Date/Time   HGB 12.6 (L) 08/05/2022 1448   HGB 12.6 (L) 04/24/2021 1131   HCT 42.0 08/05/2022 1448   HCT 40.5 04/24/2021 1131     Per Protocol for Patient with estCrcl > 30 ml/min and BMI > 30, will transition to Lovenox 80 mg Q24h.

## 2022-08-05 NOTE — ED Notes (Signed)
MD aware of pts trop of 21

## 2022-08-05 NOTE — ED Notes (Signed)
Pt given food tray, drink, tv remote and urinal as requested. Pt denies any other needs currently.

## 2022-08-05 NOTE — ED Triage Notes (Signed)
Pt via POV from home. Pt c/o L sided chest pain started this AM states that not long after that the pain is not radiating down his L arm. States the pain feels like a pressure. Pt has a hx of CHF and HTN but has not taken his medication this morning. Pt is A&OX4 and NAD

## 2022-08-05 NOTE — ED Notes (Signed)
Patient provided with ice water per request.  

## 2022-08-05 NOTE — Assessment & Plan Note (Addendum)
Nonischemic cardiomyopathy Patient presents with shortness of breath, chest pain, fluid retention and extremities Chest x-ray consistent with CHF.  BNP normal but could be related to obesity Last EF was in June 2023 with EF 20 to 25% Continue to trend troponins to evaluate for ACS Continue IV Lasix and Nitropaste Continue home carvedilol, spironolactone and Entresto Repeat echo ordered Cardiology consult

## 2022-08-05 NOTE — ED Provider Triage Note (Signed)
  Emergency Medicine Provider Triage Evaluation Note  Marc [name] , a 46 y.o.male,  was evaluated in triage.  Pt complains of chest pain.  Patient reports left-sided chest pain that started this morning with radiation to his left upper extremity.  He states that the pain has lessened since being here, but now he is having some neck/facial pain as well.  Endorses a history of CHF and hypertension.   Review of Systems  Positive: Chest pain Negative: Denies fever, abdominal pain, vomiting  Physical Exam   Vitals:   08/05/22 1441  BP: (!) 151/101  Pulse: (!) 101  Resp: 18  Temp: 97.8 F (36.6 C)  SpO2: 98%   Gen:   Awake, no distress   Resp:  Normal effort  MSK:   Moves extremities without difficulty  Other:    Medical Decision Making  Given the patient's initial medical screening exam, the following diagnostic evaluation has been ordered. The patient will be placed in the appropriate treatment space, once one is available, to complete the evaluation and treatment. I have discussed the plan of care with the patient and I have advised the patient that an ED physician or mid-level practitioner will reevaluate their condition after the test results have been received, as the results may give them additional insight into the type of treatment they may need.    Diagnostics: Labs, EKG, CXR  Treatments: none immediately   Teodoro Spray, [place] 08/05/22 1457

## 2022-08-05 NOTE — H&P (Signed)
History and Physical    Patient: Marc [name] UVO:[id number] DOB: 02/10/1976 DOA: 08/05/2022 DOS: the patient was seen and examined on 08/05/2022 PCP: Jon Billings, NP  Patient coming from: Home  Chief Complaint:  Chief Complaint  Patient presents with   Chest Pain    HPI: Marc [name] is a 46 y.o. male with medical history significant for Class IV obesity, OSA on CPAP, bipolar 1, gout, nonischemic cardiomyopathy with systolic CHF, last EF 20 to 25% 01/2022 with global hypokinesis, with history of right and left heart cath 09/2020 that showed clean coronaries and no evidence of pulmonary hypertension, currently in cardiac rehab and last evaluated by his cardiologist in June 2023 who presents to the ED, EKG with pressure-like left-sided chest pain radiating to his left arm associated with shortness of breath.  In the past several days patient has noted some fluid retention, with tightfitting rings.  He is compliant with his medication.  He has a cough that is nonproductive but denies fever or chills.  He denies leg pain. ED course and data review: BP 151/101 with pulse 101, respirations 18-27 with O2 sat 93-98 on room air.  Troponin 15-21 and BNP 93 with a D-dimer 0.46.  COVID and flu negative.  CBC and CMP mostly unremarkable/at baseline. EKG, personally viewed and interpreted showing sinus tachycardia at 108 with no ischemic ST-T wave changes.  Chest x-ray shows cardiomegaly with pulmonary edema. Patient was treated with aspirin 325, nitroglycerin ointment, IV Lasix, IV morphine.  He had some improvement in pain by admission.  Hospitalist consulted for admission.    Review of Systems: As mentioned in the history of present illness. All other systems reviewed and are negative.  Past Medical History:  Diagnosis Date   CHF (congestive heart failure) (HCC)    Hypertension    Past Surgical History:  Procedure Laterality Date   BICEPS TENDON REPAIR     COLONOSCOPY WITH PROPOFOL  N/A 10/09/2020   Procedure: COLONOSCOPY WITH PROPOFOL;  Surgeon: Lin Landsman, MD;  Location: [facility] ENDOSCOPY;  Service: Gastroenterology;  Laterality: N/A;   FLEXIBLE SIGMOIDOSCOPY N/A 10/30/2020   Procedure: FLEXIBLE SIGMOIDOSCOPY;  Surgeon: Lin Landsman, MD;  Location: [facility] ENDOSCOPY;  Service: Gastroenterology;  Laterality: N/A;   FOOT SURGERY     metataral and fasciotomy   RIGHT/LEFT HEART CATH AND CORONARY ANGIOGRAPHY Bilateral 09/19/2020   Procedure: RIGHT/LEFT HEART CATH AND CORONARY ANGIOGRAPHY;  Surgeon: Yolonda Kida, MD;  Location: [facility];  Service: Cardiovascular;  Laterality: Bilateral;   ROTATOR CUFF REPAIR     Social History:  reports that he has been smoking cigarettes. He has a 12.50 pack-year smoking history. He has never used smokeless tobacco. He reports that he does not currently use alcohol. He reports that he does not currently use drugs.  Allergies  Allergen Reactions   Geodon [Ziprasidone] Other (See Comments)    paralysis   Tramadol Other (See Comments)    Pt stated that it gave him sores.   Amoxicillin Rash   Zithromax [Azithromycin] Rash    Family History  Problem Relation Age of Onset   Stroke Mother    Dementia Mother    Hypertension Father    Diabetes Father    Hypertension Paternal Grandfather    Diabetes Paternal Grandfather     Prior to Admission medications   Medication Sig Start Date End Date Taking? Authorizing Provider  albuterol (VENTOLIN HFA) 108 (90 Base) MCG/ACT inhaler Inhale 2 puffs into the lungs every  6 (six) hours as needed for wheezing or shortness of breath. 12/19/20   Jon Billings, NP  aspirin EC 81 MG tablet Take 81 mg by mouth daily.    [provider]  carvedilol (COREG) 6.25 MG tablet Take 1 tablet (6.25 mg total) by mouth 2 (two) times daily. 08/12/20   Alisa Graff, FNP  cyclobenzaprine (FLEXERIL) 10 MG tablet Take 10 mg by mouth 3 (three) times daily. 10/16/21   [provider]  dapagliflozin propanediol (FARXIGA) 10 MG TABS tablet Take 1 tablet (10 mg total) by mouth daily before breakfast. 07/16/22   Jon Billings, NP  docusate sodium (COLACE) 100 MG capsule Take 100 mg by mouth daily as needed for mild constipation.    [provider]  sacubitril-valsartan (ENTRESTO) 97-103 MG Take 1 tablet by mouth 2 (two) times daily. 10/16/21   Alisa Graff, FNP  spironolactone (ALDACTONE) 25 MG tablet Take 1 tablet (25 mg total) by mouth daily. 02/18/22   Alisa Graff, FNP  torsemide (DEMADEX) 20 MG tablet Take 40-80 mg by mouth daily. Taking 40 in the morning and 40 at night most days    [provider]    Physical Exam: Vitals:   08/05/22 1945 08/05/22 2000 08/05/22 2030 08/05/22 2035  BP: 122/73 123/80 120/74   Pulse:  91 89   Resp:  (!) 21 (!) 27   Temp:    98 F (36.7 C)  TempSrc:    Oral  SpO2:  93% 97%   Weight:      Height:       Physical Exam Vitals and nursing note reviewed.  Constitutional:      General: He is not in acute distress. HENT:     Head: Normocephalic and atraumatic.  Cardiovascular:     Rate and Rhythm: Normal rate and regular rhythm.     Heart sounds: Normal heart sounds.  Pulmonary:     Effort: Tachypnea present.     Breath sounds: Rales present.  Abdominal:     Palpations: Abdomen is soft.     Tenderness: There is no abdominal tenderness.  Neurological:     Mental Status: Mental status is at baseline.     Labs on Admission: I have personally reviewed following labs and imaging studies  CBC: Recent Labs  Lab 08/05/22 1448  WBC 10.1  HGB 12.6*  HCT 42.0  MCV 74.9*  PLT 333   Basic Metabolic Panel: Recent Labs  Lab 08/05/22 1448  NA 139  K 3.8  CL 103  CO2 27  GLUCOSE 109*  BUN 15  CREATININE 1.05  CALCIUM 8.9   GFR: Estimated Creatinine Clearance: 128.5 mL/min (by C-G formula based on SCr of 1.05 mg/dL). Liver Function Tests: No results for input(s): "AST", "ALT",  "ALKPHOS", "BILITOT", "PROT", "ALBUMIN" in the last 168 hours. No results for input(s): "LIPASE", "AMYLASE" in the last 168 hours. No results for input(s): "AMMONIA" in the last 168 hours. Coagulation Profile: No results for input(s): "INR", "PROTIME" in the last 168 hours. Cardiac Enzymes: No results for input(s): "CKTOTAL", "CKMB", "CKMBINDEX", "TROPONINI" in the last 168 hours. BNP (last 3 results) No results for input(s): "PROBNP" in the last 8760 hours. HbA1C: No results for input(s): "HGBA1C" in the last 72 hours. CBG: No results for input(s): "GLUCAP" in the last 168 hours. Lipid Profile: No results for input(s): "CHOL", "HDL", "LDLCALC", "TRIG", "CHOLHDL", "LDLDIRECT" in the last 72 hours. Thyroid Function Tests: No results for input(s): "TSH", "T4TOTAL", "FREET4", "T3FREE", "  THYROIDAB" in the last 72 hours. Anemia Panel: No results for input(s): "VITAMINB12", "FOLATE", "FERRITIN", "TIBC", "IRON", "RETICCTPCT" in the last 72 hours. Urine analysis:    Component Value Date/Time   APPEARANCEUR Clear 04/24/2021 1117   GLUCOSEU 3+ (A) 04/24/2021 1117   BILIRUBINUR Negative 04/24/2021 1117   PROTEINUR 1+ (A) 04/24/2021 1117   NITRITE Negative 04/24/2021 1117   LEUKOCYTESUR Negative 04/24/2021 1117    Radiological Exams on Admission: DG Chest 2 View  Result Date: 08/05/2022 CLINICAL DATA:  Provided history: CHF/shortness of breath. EXAM: CHEST - 2 VIEW COMPARISON:  Prior chest radiographs 01/29/2019 and earlier. FINDINGS: Cardiomegaly. Central pulmonary vascular congestion. Interstitial and airspace opacities bilaterally compatible with pulmonary edema. No evidence of pleural effusion or pneumothorax. Degenerative changes of the spine. IMPRESSION: Cardiomegaly with pulmonary edema. Electronically Signed   By: Kellie Simmering D.O.   On: 08/05/2022 15:16     Data Reviewed: Relevant notes from primary care and specialist visits, past discharge summaries as available in EHR, including  Care Everywhere. Prior diagnostic testing as pertinent to current admission diagnoses Updated medications and problem lists for reconciliation ED course, including vitals, labs, imaging, treatment and response to treatment Triage notes, nursing and pharmacy notes and ED provider's notes Notable results as noted in HPI   Assessment and Plan: Acute on chronic systolic CHF (congestive heart failure) (HCC) Nonischemic cardiomyopathy Patient presents with shortness of breath, chest pain, fluid retention and extremities Chest x-ray consistent with CHF.  BNP normal but could be related to obesity Last EF was in June 2023 with EF 20 to 25% Continue to trend troponins to evaluate for ACS Continue IV Lasix and Nitropaste Continue home carvedilol, spironolactone and Entresto Repeat echo ordered Cardiology consult  Chest pain Typical chest pain with troponin 15-21, nonischemic EKG history of right and left heart cath 09/2020 that showed clean coronaries Suspecting demand ischemia but we will continue to trend troponin to peak Continue Nitropaste, carvedilol Continue aspirin Echo to evaluate for segmental wall motion abnormality Cardiology consult   Hypertension Continue carvedilol and spironolactone  Gout Not acutely exacerbated  BMI 50.0-59.9, adult (HCC) Potential complicating factor to overall prognosis and care  OSA on CPAP CPAP nightly        DVT prophylaxis: Lovenox  Consults: [facility] cardiology, Dr. Saralyn Pilar  Advance Care Planning:   Code Status: Prior   Family Communication: none  Disposition Plan: Back to previous home environment  Severity of Illness: The appropriate patient status for this patient is INPATIENT. Inpatient status is judged to be reasonable and necessary in order to provide the required intensity of service to ensure the patient's safety. The patient's presenting symptoms, physical exam findings, and initial radiographic and laboratory data in the  context of their chronic comorbidities is felt to place them at high risk for further clinical deterioration. Furthermore, it is not anticipated that the patient will be medically stable for discharge from the hospital within 2 midnights of admission.   * I certify that at the point of admission it is my clinical judgment that the patient will require inpatient hospital care spanning beyond 2 midnights from the point of admission due to high intensity of service, high risk for further deterioration and high frequency of surveillance required.*  Author: Athena Masse, MD 08/05/2022 10:16 PM  For on call review [email or web address].

## 2022-08-05 NOTE — ED Notes (Signed)
Pt states tightness/pressure in chest constant but fluctuates between 6-9/10; states is currently 6/10; states he feels considerably better than when he first arrived to ER; resp reg/unlabored and skin dry; pt laying calmly on bed. Bed locked low, rail up and call bell within reach.

## 2022-08-05 NOTE — Assessment & Plan Note (Signed)
Not acutely exacerbated

## 2022-08-05 NOTE — ED Notes (Signed)
IV placed, blood drawn and sent to lab. Cont. Cardiac monitor. Vitals stable. Pts chest pressure improving after morphine and nitro paste.

## 2022-08-05 NOTE — Assessment & Plan Note (Signed)
Potential complicating factor to overall prognosis and care

## 2022-08-05 NOTE — ED Provider Notes (Signed)
[facility] Provider Note    Event Date/Time   First MD Initiated Contact with Patient 08/05/22 1802     (approximate)   History   Chest Pain   HPI  Marc [name] is a 46 y.o. male Review of cardiology note from October has a history of Severely depressed overall left ventricular function EF less than 25% Severely enlarged left ventricular chamber Left main large free of disease LAD was large and free of disease Circumflex was large and free of disease left dominant Right heart cath showed no evidence of pulmonary hypertension  Treated for hypertension and congestive heart failure   Started feeling chest pressure this morning his left chest radiates down to his left arm.  It is fairly severe now constant, the pressure is in the left chest wall goes towards the arm.  He also feels a little bit short of breath with it and is noticed yesterday also that his hands were swelling had to take the rings off his right hand because there started become swollen he does feel like he is gaining some weight.  Takes torsemide at home follows with the CHF clinic  Historically has taken Viagra but not for several months  Physical Exam   Triage Vital Signs: ED Triage Vitals  Enc Vitals Group     BP 08/05/22 1441 (!) 151/101     Pulse Rate 08/05/22 1441 (!) 101     Resp 08/05/22 1441 18     Temp 08/05/22 1441 97.8 F (36.6 C)     Temp Source 08/05/22 1441 Oral     SpO2 08/05/22 1441 98 %     Weight 08/05/22 1442 (!) 348 lb (157.9 kg)     Height 08/05/22 1442 '5\' 9"'$  (1.753 m)     Head Circumference --      Peak Flow --      Pain Score 08/05/22 1442 9     Pain Loc --      Pain Edu? --      Excl. in [place]? --     Most recent vital signs: Vitals:   08/05/22 1912 08/05/22 1945  BP:  122/73  Pulse: 73   Resp: 12   Temp:    SpO2: 98%      General: Awake, no significant distress but does appear slightly tachypneic and speaks in phrases work of breathing is  just mildly labored. CV:  Good peripheral perfusion.  Normal heart tones borderline mild tachycardia Resp:  Lung sounds somewhat diminished throughout, suspect secondary somewhat to large chest wall, no obvious focal rales or crackles noted no wheezing.  Speaks in phrases.  Mild accessory muscle use and very slight tachypnea. Abd:  No distention.  Soft nontender Other:  's trace amount of edema in the hands and fingers bilaterally, strong left radial pulse warm well-perfused extremities.  Very mild edema in the lower extremities as well, equal bilaterally   ED Results / Procedures / Treatments   Labs (all labs ordered are listed, but only abnormal results are displayed) Labs Reviewed  BASIC METABOLIC PANEL - Abnormal; Notable for the following components:      Result Value   Glucose, Bld 109 (*)    All other components within normal limits  CBC - Abnormal; Notable for the following components:   Hemoglobin 12.6 (*)    MCV 74.9 (*)    MCH 22.5 (*)    RDW 17.1 (*)    All other components within normal limits  TROPONIN I (HIGH SENSITIVITY) - Abnormal; Notable for the following components:   Troponin I (High Sensitivity) 21 (*)    All other components within normal limits  RESP PANEL BY RT-PCR (FLU A&B, COVID) ARPGX2  BRAIN NATRIURETIC PEPTIDE  D-DIMER, QUANTITATIVE  TROPONIN I (HIGH SENSITIVITY)     EKG  EKG interpreted by me at 1440 heart rate 110 QRS 90 QTc 440 Sinus tachycardia, no acute ST segment abnormalities noted, very mild wander is noted in aVF.  Possible old septal MI.  There is no evidence of acute ACS though.   RADIOLOGY  Chest x-ray interpreted by me as cardiomegaly, bilateral vascular congestion, probable evidence of mild pulmonary edema.  No obvious discrete infiltrate, pattern does not seem to be highly suggestive of acute multifocal pneumonia.     PROCEDURES:  Critical Care performed: No  Procedures   MEDICATIONS ORDERED IN ED: Medications   furosemide (LASIX) injection 40 mg (40 mg Intravenous Given 08/05/22 1851)  morphine (PF) 4 MG/ML injection 4 mg (4 mg Intravenous Given 08/05/22 1852)  nitroGLYCERIN (NITROGLYN) 2 % ointment 0.5 inch (0.5 inches Topical Given 08/05/22 1841)  aspirin chewable tablet 324 mg (324 mg Oral Given 08/05/22 1841)  morphine (PF) 4 MG/ML injection 4 mg (4 mg Intravenous Given 08/05/22 2031)     IMPRESSION / MDM / ASSESSMENT AND PLAN / ED COURSE  I reviewed the triage vital signs and the nursing notes.                              Differential diagnosis includes, but is not limited to, CHF exacerbation, ACS, angina, hypertensive emergency urgency etc. less likely infectious, influenza, viral, pneumonia, etc. all considered.  No ripping tearing or moving pain or clinical symptoms of be high suggestive of aortic dissection.  He does not endorse pleuritic pain but is slightly tachycardic but not hypoxic, he has no EKG evidence of obvious thromboembolic findings on clinical exam, will send D-dimer to help with exclusion of PE  Patient's presentation is most consistent with acute presentation with potential threat to life or bodily function.  Labs reviewed.  Initial troponin without elevation.  BNP is also normal.  Chest x-ray concerning for findings that seem to represent likely pulmonary edema given his clinical history.  He also has had swelling in his hands feels that he has gained weight somewhat, and he does have mild increased work of breathing.  Do feel the patient is likely volume overloaded.  On recheck of his blood pressure his blood pressures improved to a systolic of 962 as of 9:52 PM.  Will  Heart score elevated, moderate risk   D-dimer less than 0.5   The patient is on the cardiac monitor to evaluate for evidence of arrhythmia and/or significant heart rate changes.  ----------------------------------------- 8:32 PM on 08/05/2022 -----------------------------------------  Patient  reports that shortness of breath has resolved and his cough is resolved.  He does still have a mild lingering amount of chest pressure but it is improved significantly.  Resting comfortably at this point with his troponin delta increased greater than 4 and his symptoms and risk factors we will plan to admit him for further care and treatment under the hospitalist service.  Patient and his wife are agreeable  Admission consultation with the hospitalist, patient will be admitted to the service of Dr. Damita Dunnings      FINAL CLINICAL IMPRESSION(S) / ED DIAGNOSES   Final diagnoses:  Acute  pulmonary edema (HCC)  Moderate risk chest pain     Rx / DC Orders   ED Discharge Orders     None        Note:  This document was prepared using Dragon voice recognition software and may include unintentional dictation errors.   Delman Kitten, MD 08/05/22 2122

## 2022-08-06 DIAGNOSIS — F17210 Nicotine dependence, cigarettes, uncomplicated: Secondary | ICD-10-CM | POA: Diagnosis not present

## 2022-08-06 DIAGNOSIS — I5023 Acute on chronic systolic (congestive) heart failure: Secondary | ICD-10-CM | POA: Diagnosis not present

## 2022-08-06 LAB — IRON AND TIBC
Iron: 47 ug/dL (ref 45–182)
Saturation Ratios: 12 % — ABNORMAL LOW (ref 17.9–39.5)
TIBC: 386 ug/dL (ref 250–450)
UIBC: 339 ug/dL

## 2022-08-06 LAB — TROPONIN I (HIGH SENSITIVITY): Troponin I (High Sensitivity): 17 ng/L (ref <18)

## 2022-08-06 LAB — HIV ANTIBODY (ROUTINE TESTING W REFLEX): HIV Screen 4th Generation wRfx: NONREACTIVE

## 2022-08-06 MED ORDER — LOSARTAN POTASSIUM 50 MG PO TABS: 25.0000 mg | ORAL_TABLET | Freq: Every day | ORAL | Status: DC

## 2022-08-06 MED ORDER — TORSEMIDE 20 MG PO TABS: 40.0000 mg | ORAL_TABLET | Freq: Every day | ORAL | Status: DC

## 2022-08-06 MED ORDER — LOSARTAN POTASSIUM 25 MG PO TABS: 25.0000 mg | ORAL_TABLET | Freq: Every day | ORAL | 0 refills | Status: DC

## 2022-08-06 MED ORDER — ALBUTEROL SULFATE HFA 108 (90 BASE) MCG/ACT IN AERS: 2.0000 | INHALATION_SPRAY | Freq: Four times a day (QID) | RESPIRATORY_TRACT | 0 refills | Status: DC | PRN

## 2022-08-06 MED ORDER — FUROSEMIDE 10 MG/ML IJ SOLN: 40.0000 mg | Freq: Two times a day (BID) | INTRAMUSCULAR | Status: DC

## 2022-08-06 MED ORDER — FUROSEMIDE 10 MG/ML IJ SOLN
40.0000 mg | Freq: Once | INTRAMUSCULAR | Status: AC
  Administered 2022-08-06: 40 mg via INTRAVENOUS
  Filled 2022-08-06: qty 4

## 2022-08-06 NOTE — Consult Note (Signed)
   Heart Failure Nurse Navigator Note  HFrEF 20 to 25%.  Ventricular internal cavity was mildly dilated.  He presented to the emergency room with complaints of chest pain, shortness of breath, cough and retaining fluid.  Admission he was noted to be hypertensive.  Chest x-ray revealed cardiomegaly and pulmonary edema.  BNP was 93.  Patient has a BMI of 51.39.  Comorbidities:  Obesity Obstructive sleep apnea on CPAP Bipolar Gout Nonischemic cardiomyopathy  Medications:  Aspirin 81 mg daily Carvedilol 6.25 mg daily Farxiga 10 mg daily Spironolactone 25 mg daily  Was started on losartan 25 mg daily and Entresto was discontinued because he was not taking it due to cost.  He was also discharged on torsemide 40 mg daily.  Labs:   Sodium 139, potassium 3.8, chloride 103, CO2 27, BUN 15, creatinine 1.05.  Initial meeting with patient in the emergency room.  Wife was present at the bedside.  Discussed heart failure.  He follows with the Heart Failure Clinic.  He states that this last week he had been on the go and also admitted to not taking his [name] due to cost.  He also admitted to not weighing himself daily, stressed the importance of daily weight and reporting a 2 pound weight gain overnight or 5 pounds within the week.  Also discussed notifying the clinic if changes in symptoms such as increasing shortness of breath, lower extremity edema, etc.  Also discussed sodium and fluid restriction.    Made aware that he qualifies for the ventricle health program.  He was given a flyer, directed if he is interested in the program to notify Otila Kluver when she sees him on January 8 at 10:00.  He has a 10% no-show which is 10 out of 55 appointments.  Pricilla Riffle RN [facility]

## 2022-08-06 NOTE — IPAL (Signed)
  Interdisciplinary Goals of Care Family Meeting   Date carried out: 08/06/2022  Location of the meeting: Bedside  Member's involved: Physician  Durable Power of Attorney or acting medical decision maker: patient    Discussion: We discussed goals of care for [place] .    Code status:   Code Status: Full Code   Disposition: Continue current acute care  Time spent for the meeting: [place], MD  08/06/2022, 3:10 AM

## 2022-08-06 NOTE — Consult Note (Signed)
[facility] NOTE       Patient ID: Marc [name] MRN: [id number] DOB/AGE: January 23, 1976 46 y.o.  Admit date: 08/05/2022 Referring Physician Dr. Damita Dunnings Primary Physician Jon Billings, NP Primary Cardiologist Dr. Clayborn Bigness Reason for Consultation CHF  HPI: Marc [name] is a 42yoM with a PMH of NICM (EF 20-25%, global hypo-, LV moderate dilation 01/2022), no CAD by [facility] 09/2020, OSA on CPAP, bipolar 1 disorder, tobacco abuse morbid obesity (BMI 51.39 kg/m) who presented to [facility] ED 08/05/2022 with left-sided chest pressure, fluid retention, and a nonproductive cough.  Cardiology is consulted for further assistance.  The patient states that he has been feeling short of breath for the past several days with fluid retention in his lower legs.  Also admits to chest tightness he describes as a pressure and squeezing that occurred while he was driving his mother home from a hair appointment yesterday.  This chest tightness concerned him most, which prompted him to present to the ED.  He usually takes torsemide 20 mg once every morning, but did not take his morning dose because he knew he was going to be on the go.  Additionally, he has been under significant stress over the past week including the unfortunate death of his uncle and funeral that occurred 2 days ago.  He says he did not have a great appetite over the weekend, denies dietary indiscretions or increased fluid intake.  His chest pressure was pleuritic in nature and resolved after IV Lasix, once he had urinated a lot.  At my time of evaluation he is sitting up in bed with his wife at bedside.  He is eager to go home, thinks his peripheral edema and shortness of breath is much improved.  He did not sleep very much last night and wishes to do so in his own bed today.  Also, he had not been taking Entresto or [place] over the past 2 months because the co-pay had doubled in price ($400 monthly for both medications in total).    He currently smokes half pack per day acknowledges that he needs to quit.  He does not drink alcohol or use illicit substances.  He is very active in his church community.  Review of systems complete and found to be negative unless listed above     Past Medical History:  Diagnosis Date   CHF (congestive heart failure) ([place])    Hypertension     Past Surgical History:  Procedure Laterality Date   BICEPS TENDON REPAIR     COLONOSCOPY WITH PROPOFOL N/A 10/09/2020   Procedure: COLONOSCOPY WITH PROPOFOL;  Surgeon: Lin Landsman, MD;  Location: [facility] ENDOSCOPY;  Service: Gastroenterology;  Laterality: N/A;   FLEXIBLE SIGMOIDOSCOPY N/A 10/30/2020   Procedure: FLEXIBLE SIGMOIDOSCOPY;  Surgeon: Lin Landsman, MD;  Location: [facility] ENDOSCOPY;  Service: Gastroenterology;  Laterality: N/A;   FOOT SURGERY     metataral and fasciotomy   RIGHT/LEFT HEART CATH AND CORONARY ANGIOGRAPHY Bilateral 09/19/2020   Procedure: RIGHT/LEFT HEART CATH AND CORONARY ANGIOGRAPHY;  Surgeon: Yolonda Kida, MD;  Location: [facility] CV LAB;  Service: Cardiovascular;  Laterality: Bilateral;   ROTATOR CUFF REPAIR      (Not in a hospital admission)  Social History   Socioeconomic History   Marital status: Married    Spouse name: Not on file   Number of children: Not on file   Years of education: Not on file   Highest education level: Not on file  Occupational History  Not on file  Tobacco Use   Smoking status: Every Day    Packs/day: 0.50    Years: 25.00    Total pack years: 12.50    Types: Cigarettes   Smokeless tobacco: Never  Vaping Use   Vaping Use: Never used  Substance and Sexual Activity   Alcohol use: Not Currently   Drug use: Not Currently   Sexual activity: Yes  Other Topics Concern   Not on file  Social History Narrative   Not on file   Social Determinants of Health   Financial Resource Strain: Low Risk  (05/26/2022)   Overall Financial Resource Strain (CARDIA)     Difficulty of Paying Living Expenses: Not hard at all  Food Insecurity: No Food Insecurity (05/26/2022)   Hunger Vital Sign    Worried About Running Out of Food in the Last Year: Never true    Ran Out of Food in the Last Year: Never true  Transportation Needs: No Transportation Needs (07/19/2019)   PRAPARE - Hydrologist (Medical): No    Lack of Transportation (Non-Medical): No  Physical Activity: Inactive (05/26/2022)   Exercise Vital Sign    Days of Exercise per Week: 0 days    Minutes of Exercise per Session: 0 min  Stress: Stress Concern Present (05/26/2022)   [place]    Feeling of Stress : To some extent  Social Connections: Moderately Integrated (05/26/2022)   Social Connection and Isolation Panel [NHANES]    Frequency of Communication with Friends and Family: Once a week    Frequency of Social Gatherings with Friends and Family: Twice a week    Attends Religious Services: More than 4 times per year    Active Member of [employer] or Organizations: No    Attends Archivist Meetings: Never    Marital Status: Married  Human resources officer Violence: Not At Risk (05/26/2022)   Humiliation, Afraid, Rape, and Kick questionnaire    Fear of Current or Ex-Partner: No    Emotionally Abused: No    Physically Abused: No    Sexually Abused: No    Family History  Problem Relation Age of Onset   Stroke Mother    Dementia Mother    Hypertension Father    Diabetes Father    Hypertension Paternal Grandfather    Diabetes Paternal Grandfather       Intake/Output Summary (Last 24 hours) at 08/06/2022 0846 Last data filed at 08/06/2022 8250 Gross per 24 hour  Intake 120 ml  Output 1000 ml  Net -880 ml    Vitals:   08/06/22 0448 08/06/22 0452 08/06/22 0513 08/06/22 0530  BP: 110/76  110/76 118/77  Pulse:   69   Resp: (!) 26  20 (!) 23  Temp:  (!) 97 F (36.1 C) (!) 97 F (36.1 C)    TempSrc:  Oral Oral   SpO2:   98%   Weight:      Height:        PHYSICAL EXAM General: Pleasant middle-aged black male, well nourished, in no acute distress.  Sitting upright in hospital bed with wife at bedside. HEENT:  Normocephalic and atraumatic. Neck:  No JVD.  Lungs: Slight conversational dyspnea, clear bilaterally to auscultation. No wheezes, crackles, rhonchi.  Heart: HRRR . Normal S1 and S2 without gallops or murmurs.  Abdomen: Obese appearing.  Msk: Normal strength and tone for age. Extremities: Warm and well perfused. No clubbing, cyanosis.  Trace bilateral lower extremity edema.  Neuro: Alert and oriented X 3. Psych:  Answers questions appropriately.   Labs: Basic Metabolic Panel: Recent Labs    08/05/22 1448  NA 139  K 3.8  CL 103  CO2 27  GLUCOSE 109*  BUN 15  CREATININE 1.05  CALCIUM 8.9   Liver Function Tests: No results for input(s): "AST", "ALT", "ALKPHOS", "BILITOT", "PROT", "ALBUMIN" in the last 72 hours. No results for input(s): "LIPASE", "AMYLASE" in the last 72 hours. CBC: Recent Labs    08/05/22 1448  WBC 10.1  HGB 12.6*  HCT 42.0  MCV 74.9*  PLT 265   Cardiac Enzymes: Recent Labs    08/05/22 1448 08/05/22 1908  TROPONINIHS 15 21*   BNP: Recent Labs    08/05/22 1448  BNP 93.0   D-Dimer: Recent Labs    08/05/22 1908  DDIMER 0.46   Hemoglobin A1C: No results for input(s): "HGBA1C" in the last 72 hours. Fasting Lipid Panel: No results for input(s): "CHOL", "HDL", "LDLCALC", "TRIG", "CHOLHDL", "LDLDIRECT" in the last 72 hours. Thyroid Function Tests: No results for input(s): "TSH", "T4TOTAL", "T3FREE", "THYROIDAB" in the last 72 hours.  Invalid input(s): "FREET3" Anemia Panel: No results for input(s): "VITAMINB12", "FOLATE", "FERRITIN", "TIBC", "IRON", "RETICCTPCT" in the last 72 hours.   Radiology: DG Chest 2 View  Result Date: 08/05/2022 CLINICAL DATA:  Provided history: CHF/shortness of breath. EXAM: CHEST - 2 VIEW  COMPARISON:  Prior chest radiographs 01/29/2019 and earlier. FINDINGS: Cardiomegaly. Central pulmonary vascular congestion. Interstitial and airspace opacities bilaterally compatible with pulmonary edema. No evidence of pleural effusion or pneumothorax. Degenerative changes of the spine. IMPRESSION: Cardiomegaly with pulmonary edema. Electronically Signed   By: Kellie Simmering D.O.   On: 08/05/2022 15:16    ECHO 02/04/2022  1. Left ventricular ejection fraction, by estimation, is 20 to 25%. The  left ventricle has severely decreased function. The left ventricle  demonstrates global hypokinesis. The left ventricular internal cavity size  was moderately dilated. Left  ventricular diastolic function could not be evaluated.   2. Right ventricular systolic function was not well visualized. The right  ventricular size is not well visualized.   3. The mitral valve is normal in structure. Trivial mitral valve  regurgitation.   4. The aortic valve is normal in structure. Aortic valve regurgitation is  not visualized.   Conclusion(s)/Recommendation(s): Limited study d/t poor acoutic windows.   TELEMETRY reviewed by me (LT) 08/06/2022 : Sinus rhythm 60s to 70s  EKG reviewed by me: Sinus tach rate 108  Data reviewed by me (LT) 08/06/2022: Last cardiology note, last heart failure clinic note, admission H&P, ED note, CBC BMP troponins EKG  Active Problems:   Chest pain   OSA on CPAP   BMI 50.0-59.9, adult (HCC)   Gout   Hypertension   Acute on chronic systolic CHF (congestive heart failure) ([place])   Nonischemic cardiomyopathy ([place])    ASSESSMENT AND PLAN:  Marc [name] is a 60yoM with a PMH of NICM (EF 20-25%, global hypo-, LV moderate dilation 01/2022), no CAD by [facility] 09/2020, OSA on CPAP, bipolar 1 disorder, tobacco abuse morbid obesity (BMI 51.39 kg/m) who presented to [facility] ED 08/05/2022 with left-sided chest pressure, fluid retention, and a nonproductive cough.  Cardiology is consulted for  further assistance.  # Acute on chronic HFrEF # Atypical chest pain Presented with several days of shortness of breath in the setting of a missed torsemide dose yesterday morning, and significant family stress.  His left-sided  chest pressure and squeezing also had a pleuritic component, that resolved with administration of IV Lasix and after he made good urine.  His BNP was actually within normal limits at 95 on presentation and he appears euvolemic on exam.  His EKG does not show any ischemic changes, troponins are 15, 21, 17, inconsistent with ACS.  He has been off [place] for the past 2 months because his co-pay doubled and he cannot afford it. -S/p IV Lasix 40 mg x 3 doses with clinical improvement.  Resume torsemide 40 mg once daily at discharge -Continue carvedilol 6.25 mg twice a day -Discontinue Entresto, start losartan 25 mg daily on 12/26, uptitrate as blood pressure allows -Continue spironolactone 25 mg once a day -Consider Jardiance on an outpatient basis, only if co-pay is reasonable -Defer repeat cardiac diagnostics -Okay for discharge today from a cardiac standpoint.  Follow-up with Dr. Clayborn Bigness 1 to 2 weeks in the heart failure clinic as scheduled  # Tobacco abuse Currently smokes half pack per day, he has tried patches, Chantix, and gum without success.  Strongly encouraged complete cessation, he is motivated to quit.  This patient's plan of care was discussed and created with Dr. Clayborn Bigness and he is in agreement.  Signed: Tristan Schroeder , PA-C 08/06/2022, 8:46 AM [facility]

## 2022-08-06 NOTE — Care Management Obs Status (Signed)
[place] NOTIFICATION   Patient Details  Name: Marc [name] MRN: [id number] Date of Birth: 16-Dec-1975   Medicare Observation Status Notification Given:  Yes    Shelbie Hutching, RN 08/06/2022, 1:56 PM

## 2022-08-06 NOTE — ED Notes (Addendum)
Pt denies needs. Watching tv in no acute distress. Lab notified previously will need venipuncture this am

## 2022-08-06 NOTE — Discharge Summary (Addendum)
Physician Discharge Summary  Marc [name] JKD:[id number] DOB: 06-10-1976 DOA: 08/05/2022  PCP: Jon Billings, NP  Admit date: 08/05/2022 Discharge date: 08/06/2022  Admitted From: Home Disposition:  Home  Discharge Condition:Stable CODE STATUS:FULL Diet recommendation: Heart Healthy   Brief/Interim Summary: Patient is a 46 year old male with history of class IV obesity, OSA on CPAP, bipolar 1 disorder, gout, nonischemic cardiomyopathy with a decreased EF of 20 to 25% presented to the emergency department with complaints of chest pain, shortness shortness of breath. He has noted that he has been retaining fluid with tightening of the finger rings. He also had cough. As per him, he was compliant with his medications. On presentation he was hypertensive, saturating fine on room air. EKG showed sinus tachycardia. Chest ray showed cardiomegaly with pulmonary edema. Patient was given IV Lasix in the emergency department. Admitted for the management of acute on chronic systolic CHF exacerbation, cardiology consulted .  During my evaluation today, he looks almost euvolemic.  He was on room air, lungs were clear to auscultation, there was no significant edema in the bilateral lower extremities.  Cardiology cleared him for discharge today with outpatient follow-up.  Continue home torsemide on discharge  Following problems were addressed during his hospitalization:  Acute on chronic systolic CHF/nonischemic cardiopathy: Follows with [facility].  Presented with shortness of breath, chest pain, fluid retention.  Chest x-ray consistent with findings of CHF exacerbation.  BNP normal but most likely inaccurate due to her obesity.  Last EF as per echo in June 2023 showed EF of 20 to 25% Started on  iv Lasix.  Now almost looks euvolemic, cardiology cleared him for discharge. Continue home carvedilol, spironolactone , torsemide.  Entresto discontinued because he was not taking it because of high cost,  changed to losartan.  He will follow-up with Dr. Clayborn Bigness as an outpatient.  Chest pain: With flat trend and mild elevated troponin.  EKG did not show any ischemic symptoms.  Right heart cath/left heart cath done on 2/22 showed clean coronaries.  Chest pain has resolved this morning  Hypertension: Continue carvedilol, losartan, spironolactone,   Gout: Not in acute exacerbation   Morbid obesity: BMI 51.3   OSA: On CPAP at night    Discharge Diagnoses:  Principal Problem:   Acute on chronic systolic CHF (congestive heart failure) (HCC) Active Problems:   Nonischemic cardiomyopathy (HCC)   Chest pain   OSA on CPAP   BMI 50.0-59.9, adult ([place])   Gout   Hypertension    Discharge Instructions  Discharge Instructions     Diet - low sodium heart healthy   Complete by: As directed    Discharge instructions   Complete by: As directed    1)Please take prescribed medications as instructed 2)Follow up with PCP and cardiologist as an outpatient 3)Monitor your weight at home   Increase activity slowly   Complete by: As directed       Allergies as of 08/06/2022       Reactions   Geodon [ziprasidone] Other (See Comments)   paralysis   Tramadol Other (See Comments)   Pt stated that it gave him sores.   Amoxicillin Rash   Zithromax [azithromycin] Rash        Medication List     STOP taking these medications    sacubitril-valsartan 97-103 MG Commonly known as: ENTRESTO       TAKE these medications    albuterol 108 (90 Base) MCG/ACT inhaler Commonly known as: VENTOLIN HFA Inhale 2 puffs into  the lungs every 6 (six) hours as needed for wheezing or shortness of breath.   aspirin EC 81 MG tablet Take 81 mg by mouth daily.   carvedilol 6.25 MG tablet Commonly known as: COREG Take 1 tablet (6.25 mg total) by mouth 2 (two) times daily. What changed: Another medication with the same name was removed. Continue taking this medication, and follow the directions you see  here.   cyclobenzaprine 10 MG tablet Commonly known as: FLEXERIL Take 10 mg by mouth 3 (three) times daily.   dapagliflozin propanediol 10 MG Tabs tablet Commonly known as: Farxiga Take 1 tablet (10 mg total) by mouth daily before breakfast.   docusate sodium 100 MG capsule Commonly known as: COLACE Take 100 mg by mouth daily as needed for mild constipation.   losartan 25 MG tablet Commonly known as: COZAAR Take 1 tablet (25 mg total) by mouth daily. Start taking on: August 11, 2022   spironolactone 25 MG tablet Commonly known as: ALDACTONE Take 1 tablet (25 mg total) by mouth daily.   torsemide 20 MG tablet Commonly known as: DEMADEX Take 40-80 mg by mouth daily. Taking 40 in the morning and 40 at night most days        Follow-up Information     Yolonda Kida, MD. Go in 1 week(s).   Specialties: Cardiology, Internal Medicine Why: Please schedule after Jan 3rd Contact information: [place] [address] [phone]         Jon Billings, NP. Schedule an appointment as soon as possible for a visit in 1 week(s).   Specialty: Nurse Practitioner Contact information: [place] [address] [phone]                Allergies  Allergen Reactions   Geodon [Ziprasidone] Other (See Comments)    paralysis   Tramadol Other (See Comments)    Pt stated that it gave him sores.   Amoxicillin Rash   Zithromax [Azithromycin] Rash    Consultations: Cardiology   Procedures/Studies: DG Chest 2 View  Result Date: 08/05/2022 CLINICAL DATA:  Provided history: CHF/shortness of breath. EXAM: CHEST - 2 VIEW COMPARISON:  Prior chest radiographs 01/29/2019 and earlier. FINDINGS: Cardiomegaly. Central pulmonary vascular congestion. Interstitial and airspace opacities bilaterally compatible with pulmonary edema. No evidence of pleural effusion or pneumothorax. Degenerative changes of the spine. IMPRESSION: Cardiomegaly  with pulmonary edema. Electronically Signed   By: Kellie Simmering D.O.   On: 08/05/2022 15:16      Subjective: Patient seen and examined at bedside today.  Medically stable for discharge.  Discharge Exam: Vitals:   08/06/22 1400 08/06/22 1415  BP:  (!) 90/58  Pulse: 72   Resp: 19   Temp:    SpO2: 94%    Vitals:   08/06/22 1241 08/06/22 1245 08/06/22 1400 08/06/22 1415  BP: 105/68   (!) 90/58  Pulse: 69  72   Resp: 19  19   Temp:  97.6 F (36.4 C)    TempSrc:  Oral    SpO2: 96%  94%   Weight:      Height:        General: Pt is alert, awake, not in acute distress, morbidly obese Cardiovascular: RRR, S1/S2 +, no rubs, no gallops Respiratory: CTA bilaterally, no wheezing, no rhonchi Abdominal: Soft, NT, ND, bowel sounds + Extremities: no edema, no cyanosis    The results of significant diagnostics from this hospitalization (including imaging, microbiology, ancillary and laboratory) are  listed below for reference.     Microbiology: Recent Results (from the past 240 hour(s))  Resp Panel by RT-PCR (Flu A&B, Covid) Anterior Nasal Swab     Status: None   Collection Time: 08/05/22  7:09 PM   Specimen: Anterior Nasal Swab  Result Value Ref Range Status   SARS Coronavirus 2 by RT PCR NEGATIVE NEGATIVE Final    Comment: (NOTE) SARS-CoV-2 target nucleic acids are NOT DETECTED.  The SARS-CoV-2 RNA is generally detectable in upper respiratory specimens during the acute phase of infection. The lowest concentration of SARS-CoV-2 viral copies this assay can detect is 138 copies/mL. A negative result does not preclude SARS-Cov-2 infection and should not be used as the sole basis for treatment or other patient management decisions. A negative result may occur with  improper specimen collection/handling, submission of specimen other than nasopharyngeal swab, presence of viral mutation(s) within the areas targeted by this assay, and inadequate number of viral copies(<138 copies/mL).  A negative result must be combined with clinical observations, patient history, and epidemiological information. The expected result is Negative.  Fact Sheet for Patients:  EntrepreneurPulse.com.au  Fact Sheet for Healthcare Providers:  IncredibleEmployment.be  This test is no t yet approved or cleared by the [place] FDA and  has been authorized for detection and/or diagnosis of SARS-CoV-2 by FDA under an Emergency Use Authorization (EUA). This EUA will remain  in effect (meaning this test can be used) for the duration of the COVID-19 declaration under Section 564(b)(1) of the Act, 21 U.S.C.section 360bbb-3(b)(1), unless the authorization is terminated  or revoked sooner.       Influenza A by PCR NEGATIVE NEGATIVE Final   Influenza B by PCR NEGATIVE NEGATIVE Final    Comment: (NOTE) The Xpert Xpress SARS-CoV-2/FLU/RSV plus assay is intended as an aid in the diagnosis of influenza from Nasopharyngeal swab specimens and should not be used as a sole basis for treatment. Nasal washings and aspirates are unacceptable for Xpert Xpress SARS-CoV-2/FLU/RSV testing.  Fact Sheet for Patients: EntrepreneurPulse.com.au  Fact Sheet for Healthcare Providers: IncredibleEmployment.be  This test is not yet approved or cleared by the [place] FDA and has been authorized for detection and/or diagnosis of SARS-CoV-2 by FDA under an Emergency Use Authorization (EUA). This EUA will remain in effect (meaning this test can be used) for the duration of the COVID-19 declaration under Section 564(b)(1) of the Act, 21 U.S.C. section 360bbb-3(b)(1), unless the authorization is terminated or revoked.  Performed at [facility], [address] [place], [place] [address]      Labs: BNP (last 3 results) Recent Labs    08/05/22 1448  BNP 10.9   Basic Metabolic Panel: Recent Labs  Lab 08/05/22 1448  NA  139  K 3.8  CL 103  CO2 27  GLUCOSE 109*  BUN 15  CREATININE 1.05  CALCIUM 8.9   Liver Function Tests: No results for input(s): "AST", "ALT", "ALKPHOS", "BILITOT", "PROT", "ALBUMIN" in the last 168 hours. No results for input(s): "LIPASE", "AMYLASE" in the last 168 hours. No results for input(s): "AMMONIA" in the last 168 hours. CBC: Recent Labs  Lab 08/05/22 1448  WBC 10.1  HGB 12.6*  HCT 42.0  MCV 74.9*  PLT 265   Cardiac Enzymes: No results for input(s): "CKTOTAL", "CKMB", "CKMBINDEX", "TROPONINI" in the last 168 hours. BNP: Invalid input(s): "POCBNP" CBG: No results for input(s): "GLUCAP" in the last 168 hours. D-Dimer Recent Labs    08/05/22 1908  DDIMER 0.46  Hgb A1c No results for input(s): "HGBA1C" in the last 72 hours. Lipid Profile No results for input(s): "CHOL", "HDL", "LDLCALC", "TRIG", "CHOLHDL", "LDLDIRECT" in the last 72 hours. Thyroid function studies No results for input(s): "TSH", "T4TOTAL", "T3FREE", "THYROIDAB" in the last 72 hours.  Invalid input(s): "FREET3" Anemia work up Recent Labs    08/06/22 0854  TIBC 386  IRON 47   Urinalysis    Component Value Date/Time   APPEARANCEUR Clear 04/24/2021 1117   GLUCOSEU 3+ (A) 04/24/2021 1117   BILIRUBINUR Negative 04/24/2021 1117   PROTEINUR 1+ (A) 04/24/2021 1117   NITRITE Negative 04/24/2021 1117   LEUKOCYTESUR Negative 04/24/2021 1117   Sepsis Labs Recent Labs  Lab 08/05/22 1448  WBC 10.1   Microbiology Recent Results (from the past 240 hour(s))  Resp Panel by RT-PCR (Flu A&B, Covid) Anterior Nasal Swab     Status: None   Collection Time: 08/05/22  7:09 PM   Specimen: Anterior Nasal Swab  Result Value Ref Range Status   SARS Coronavirus 2 by RT PCR NEGATIVE NEGATIVE Final    Comment: (NOTE) SARS-CoV-2 target nucleic acids are NOT DETECTED.  The SARS-CoV-2 RNA is generally detectable in upper respiratory specimens during the acute phase of infection. The lowest concentration  of SARS-CoV-2 viral copies this assay can detect is 138 copies/mL. A negative result does not preclude SARS-Cov-2 infection and should not be used as the sole basis for treatment or other patient management decisions. A negative result may occur with  improper specimen collection/handling, submission of specimen other than nasopharyngeal swab, presence of viral mutation(s) within the areas targeted by this assay, and inadequate number of viral copies(<138 copies/mL). A negative result must be combined with clinical observations, patient history, and epidemiological information. The expected result is Negative.  Fact Sheet for Patients:  EntrepreneurPulse.com.au  Fact Sheet for Healthcare Providers:  IncredibleEmployment.be  This test is no t yet approved or cleared by the [place] FDA and  has been authorized for detection and/or diagnosis of SARS-CoV-2 by FDA under an Emergency Use Authorization (EUA). This EUA will remain  in effect (meaning this test can be used) for the duration of the COVID-19 declaration under Section 564(b)(1) of the Act, 21 U.S.C.section 360bbb-3(b)(1), unless the authorization is terminated  or revoked sooner.       Influenza A by PCR NEGATIVE NEGATIVE Final   Influenza B by PCR NEGATIVE NEGATIVE Final    Comment: (NOTE) The Xpert Xpress SARS-CoV-2/FLU/RSV plus assay is intended as an aid in the diagnosis of influenza from Nasopharyngeal swab specimens and should not be used as a sole basis for treatment. Nasal washings and aspirates are unacceptable for Xpert Xpress SARS-CoV-2/FLU/RSV testing.  Fact Sheet for Patients: EntrepreneurPulse.com.au  Fact Sheet for Healthcare Providers: IncredibleEmployment.be  This test is not yet approved or cleared by the [place] FDA and has been authorized for detection and/or diagnosis of SARS-CoV-2 by FDA under an Emergency Use  Authorization (EUA). This EUA will remain in effect (meaning this test can be used) for the duration of the COVID-19 declaration under Section 564(b)(1) of the Act, 21 U.S.C. section 360bbb-3(b)(1), unless the authorization is terminated or revoked.  Performed at [facility], [address] [place], [place] [address]     Please note: You were cared for by a hospitalist during your hospital stay. Once you are discharged, your primary care physician will handle any further medical issues. Please note that NO REFILLS for any discharge medications will be authorized once  you are discharged, as it is imperative that you return to your primary care physician (or establish a relationship with a primary care physician if you do not have one) for your post hospital discharge needs so that they can reassess your need for medications and monitor your lab values.    Time coordinating discharge: 40 minutes  SIGNED:   Shelly Coss, MD  [facility] 08/06/2022, 2:25 PM Pager [phone]  If 7PM-7AM, please contact night-coverage [email or web address] Password [email or web address]

## 2022-08-06 NOTE — ED Notes (Signed)
Report to travis, rn.

## 2022-08-06 NOTE — Care Management CC44 (Signed)
Condition Code 44 Documentation Completed  Patient Details  Name: SOMNANG MAHAN MRN: [id number] Date of Birth: 03/03/1976   Condition Code 44 given:  Yes Patient signature on Condition Code 44 notice:  Yes Documentation of 2 MD's agreement:  Yes Code 44 added to claim:  Yes    Shelbie Hutching, RN 08/06/2022, 1:56 PM

## 2022-08-06 NOTE — ED Notes (Addendum)
Report from georgie,rn.  

## 2022-08-06 NOTE — ED Notes (Signed)
HOB adjusted and lights turned off for pt; call bell within reach, bed locked low, rail up, pt showed how to adjust bed further if he desires. Pt watching tv. Pt reports feels better post breathing tx.

## 2022-08-06 NOTE — Discharge Instructions (Signed)

## 2022-08-06 NOTE — TOC Initial Note (Signed)
Transition of Care [facility]) - Initial/Assessment Note    Patient Details  Name: Marc [name] MRN: [id number] Date of Birth: 1976/08/15  Transition of Care [facility]) CM/SW Contact:    Shelbie Hutching, RN Phone Number: 08/06/2022, 2:08 PM  Clinical Narrative:                  Transition of Care (TOC) Screening Note   Patient Details  Name: Marc [name] Date of Birth: 1975-11-13   Transition of Care [facility]) CM/SW Contact:    Shelbie Hutching, RN Phone Number: 08/06/2022, 2:08 PM    Transition of Care Department [facility]) has reviewed patient and no TOC needs have been identified at this time. We will continue to monitor patient advancement through interdisciplinary progression rounds. If new patient transition needs arise, please place a TOC consult.    Jimsey the HF nurse navigator is meeting with the patient.          Patient Goals and CMS Choice            Expected Discharge Plan and Services           Expected Discharge Date: 08/06/22                                    Prior Living Arrangements/Services                       Activities of Daily Living      Permission Sought/Granted                  Emotional Assessment              Admission diagnosis:  CHF exacerbation ([place]) [I50.9] Acute on chronic systolic CHF (congestive heart failure) ([place]) [I50.23] Patient Active Problem List   Diagnosis Date Noted   Acute on chronic systolic CHF (congestive heart failure) ([place]) 08/05/2022   Nonischemic cardiomyopathy ([place]) 08/05/2022   Aortic atherosclerosis ([place]) 07/24/2021   Erectile dysfunction 04/24/2021   Gout 12/19/2020   Hypertension 12/19/2020   OSA on CPAP 11/18/2020   CPAP use counseling 11/18/2020   Morbid obesity ([place]) 11/18/2020   BMI 50.0-59.9, adult ([place]) 11/18/2020   Congestive heart failure ([place]) 11/18/2020   Tobacco user 11/18/2020   H/O colonoscopy with polypectomy    Rectal bleeding    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 [place] [address] [address] Phone: [phone] Fax: [phone]     Social Determinants of Health (SDOH) Social History: SDOH Screenings   Food Insecurity: No Food Insecurity (05/26/2022)  Housing: Low Risk  (05/26/2022)  Transportation Needs: No Transportation Needs (07/19/2019)  Utilities: Not At Risk (05/26/2022)  Alcohol Screen: Low Risk  (05/26/2022)  Depression (PHQ2-9): Low Risk  (05/26/2022)  Financial Resource Strain: Low Risk  (05/26/2022)  Physical Activity: Inactive (05/26/2022)  Social Connections: Moderately Integrated (05/26/2022)  Stress: Stress Concern Present (05/26/2022)  Tobacco Use: High Risk (08/05/2022)   SDOH Interventions:     Readmission Risk Interventions     No data to display

## 2022-08-22 NOTE — Progress Notes (Signed)
Patient ID: Marc [name], male    DOB: 09-Dec-1975, 47 y.o.   MRN: [id number]   Marc [name] is a 47 y/o male with a history of HTN, chronic pain, bipolar, current tobacco use and chronic heart failure.   Echo report from 02/04/22 reviewed and showed an EF of 20-25%. Echo report from 10/10/2018 reviewed and showed an EF of 20-25% along with moderate Marc/TR.   RHC/LHC done 09/19/20 and showed: Severely depressed overall left ventricular function EF less than 25% Severely enlarged left ventricular chamber Left main large free of disease LAD was large and free of disease Circumflex was large and free of disease left dominant Right heart cath showed no evidence of pulmonary hypertension Mean PA was 23 mean wedge of 10 Cardiac output of 7.6 Fick  Was in the ED 08/05/22 due to chest pain and pulmonary edema. Discharged the next day.   Marc [name] presents today for a follow-up visit with a chief complaint of moderate fatigue with minimal exertion. Describes this as chronic in nature. Has associated cough, shortness of breath, intermittent chest pain, headaches, leg weakness, difficulty sleeping and chronic pain along with this Denies any abdominal distention, palpitations, pedal edema, wheezing, dizziness or weight gain.   Patient assistance forms were filled out today for both erntresto and farxiga.   Past Medical History:  Diagnosis Date   CHF (congestive heart failure) (HCC)    Hypertension    Past Surgical History:  Procedure Laterality Date   BICEPS TENDON REPAIR     COLONOSCOPY WITH PROPOFOL N/A 10/09/2020   Procedure: COLONOSCOPY WITH PROPOFOL;  Surgeon: Lin Landsman, MD;  Location: [facility] ENDOSCOPY;  Service: Gastroenterology;  Laterality: N/A;   FLEXIBLE SIGMOIDOSCOPY N/A 10/30/2020   Procedure: FLEXIBLE SIGMOIDOSCOPY;  Surgeon: Lin Landsman, MD;  Location: [facility] ENDOSCOPY;  Service: Gastroenterology;  Laterality: N/A;   FOOT SURGERY     metataral and fasciotomy   RIGHT/LEFT  HEART CATH AND CORONARY ANGIOGRAPHY Bilateral 09/19/2020   Procedure: RIGHT/LEFT HEART CATH AND CORONARY ANGIOGRAPHY;  Surgeon: Yolonda Kida, MD;  Location: [facility] LAB;  Service: Cardiovascular;  Laterality: Bilateral;   ROTATOR CUFF REPAIR     Family History  Problem Relation Age of Onset   Stroke Mother    Dementia Mother    Hypertension Father    Diabetes Father    Hypertension Paternal Grandfather    Diabetes Paternal Grandfather    Social History   Tobacco Use   Smoking status: Every Day    Packs/day: 0.50    Years: 25.00    Total pack years: 12.50    Types: Cigarettes   Smokeless tobacco: Never  Substance Use Topics   Alcohol use: Not Currently   Allergies  Allergen Reactions   Geodon [Ziprasidone] Other (See Comments)    paralysis   Tramadol Other (See Comments)    Pt stated that it gave him sores.   Amoxicillin Rash   Zithromax [Azithromycin] Rash   Prior to Admission medications   Medication Sig Start Date End Date Taking? Authorizing Provider  albuterol (VENTOLIN HFA) 108 (90 Base) MCG/ACT inhaler Inhale 2 puffs into the lungs every 6 (six) hours as needed for wheezing or shortness of breath. 08/06/22  Yes Shelly Coss, MD  aspirin EC 81 MG tablet Take 81 mg by mouth daily.   Yes [provider]  carvedilol (COREG) 6.25 MG tablet Take 1 tablet (6.25 mg total) by mouth 2 (two) times daily. Patient taking differently: Take 3.125 mg by  mouth 2 (two) times daily. 08/12/20  Yes Xanthe Couillard, Otila Kluver A, FNP  cyclobenzaprine (FLEXERIL) 10 MG tablet Take 10 mg by mouth 3 (three) times daily as needed for muscle spasms. 10/16/21  Yes [provider]  docusate sodium (COLACE) 100 MG capsule Take 100 mg by mouth daily as needed for mild constipation.   Yes [provider]  spironolactone (ALDACTONE) 25 MG tablet Take 1 tablet (25 mg total) by mouth daily. 02/18/22  Yes Mandalyn Pasqua, Otila Kluver A, FNP  torsemide (DEMADEX) 20 MG tablet Take 40 mg by mouth  2 (two) times daily.   Yes [provider]  dapagliflozin propanediol (FARXIGA) 10 MG TABS tablet Take 1 tablet (10 mg total) by mouth daily before breakfast. Patient not taking: Reported on 08/24/2022 07/16/22   Jon Billings, NP  losartan (COZAAR) 25 MG tablet Take 1 tablet (25 mg total) by mouth daily. Patient not taking: Reported on 08/24/2022 08/11/22   Shelly Coss, MD   Review of Systems  Constitutional:  Positive for fatigue (tired "all the time"). Negative for appetite change.  HENT:  Negative for congestion, postnasal drip and sore throat.   Eyes: Negative.   Respiratory:  Positive for cough (at times) and shortness of breath. Negative for chest tightness and wheezing.   Cardiovascular:  Positive for chest pain (at times). Negative for palpitations and leg swelling.  Gastrointestinal:  Negative for abdominal distention and abdominal pain.  Endocrine: Negative.   Genitourinary: Negative.   Musculoskeletal:  Positive for arthralgias (left knee), back pain and neck pain.  Skin: Negative.   Allergic/Immunologic: Negative.   Neurological:  Positive for weakness (legs) and headaches. Negative for dizziness and light-headedness.  Hematological:  Negative for adenopathy. Does not bruise/bleed easily.  Psychiatric/Behavioral:  Positive for sleep disturbance (trouble falling asleep; wearing CPAP). Negative for dysphoric mood. The patient is not nervous/anxious.    Vitals:   08/24/22 1004  BP: 136/81  Pulse: 75  Resp: 18  SpO2: 99%  Weight: (!) 353 lb 4 oz (160.2 kg)   Wt Readings from Last 3 Encounters:  08/24/22 (!) 353 lb 4 oz (160.2 kg)  08/05/22 (!) 348 lb (157.9 kg)  05/25/22 (!) 358 lb 4 oz (162.5 kg)   Lab Results  Component Value Date   CREATININE 1.05 08/05/2022   CREATININE 1.00 02/20/2022   CREATININE 0.80 01/21/2022   Physical Exam Vitals and nursing note reviewed.  HENT:     Head: Normocephalic and atraumatic.  Cardiovascular:     Rate and  Rhythm: Normal rate and regular rhythm.  Pulmonary:     Effort: Pulmonary effort is normal.     Breath sounds: No wheezing or rales.     Comments: Diminished due to chest wall size Abdominal:     General: There is no distension.     Palpations: Abdomen is soft.     Tenderness: There is no abdominal tenderness.  Musculoskeletal:        General: No tenderness.     Right shoulder: No tenderness.     Cervical back: Normal range of motion and neck supple.     Right lower leg: No tenderness. No edema.     Left lower leg: No tenderness. No edema.  Skin:    General: Skin is warm and dry.  Neurological:     General: No focal deficit present.     Mental Status: Marc [name] is alert and oriented to person, place, and time.  Psychiatric:        Attention and  Perception: Attention normal.        Mood and Affect: Mood is not anxious or depressed.        Behavior: Behavior normal.     Assessment & Plan:  1. Chronic heart failure with reduced ejection fraction- - NYHA class III - euvolemic today - weighing daily; reminded to call for an overnight weight gain of >2 pounds or a weekly weight gain of >5 pounds - weight down 5 pounds from last visit here 3 months ago - cooks with himalayan pink salt and reviewed sodium content of this; encouraged him to add very little to food when cooking as it still has high sodium content (~ '400mg'$ / 1/4 teaspoon); does not add any salt to the food after cooking - eats out weekly on Saturdays after church and also doesn't take his AM torsemide that day; takes the PM dose and spironolactone when Marc [name] gets home but does notice that Marc [name]'s more fatigued on Sundays - saw cardiology Clayborn Bigness) 01/27/22; needs to call and schedule a f/u appointment - on GDMT of carvedilol & spironolactone - start entresto 24/'26mg'$  BID (never started losartan) - start farxiga '10mg'$  QD - patient assistance forms filled out today - BMP next visit - BNP 08/05/22 was 93.0 - PharmD reconciled meds  w/patient  2: HTN- - BP 136/81 - saw PCP [facility] Dad) 02/20/22; changing to [name] on 09/15/22 - BMP from 08/05/22 reviewed and showed sodium 139, potassium 3.8, creatinine 1.05 and GFR >60  3: Anxiety/ depression- - patient says that previously Marc [name]'s talked to the therapist at [name] - appears to be under control at this time  4: Tobacco use- - smoking ~ 1/2 ppd of cigarettes - denies alcohol or drug use - complete cessation discussed for 3 minutes with the patient; discussed substituting cigarette with a sugar free lollipop or toothpick - Marc [name] says that Marc [name]'s quit a couple of times before and has used toothpicks to help replace the cigarette  5: Obstructive sleep apnea- - saw pulmonology Humphrey Rolls) 11/18/20; NS 05/04/22 and advised him to contact them to r/s this appointment - wearing his CPAP nightly & reports resting well   Patient did not bring his medications nor a list. Each medication was verbally reviewed with the patient and Marc [name] was encouraged to bring the bottles to every visit to confirm accuracy of list.   Return in 1 month, sooner if needed.

## 2022-08-24 ENCOUNTER — Encounter: Payer: Self-pay | Admitting: Family

## 2022-08-24 ENCOUNTER — Ambulatory Visit: Payer: Medicare Other | Attending: Family | Admitting: Family

## 2022-08-24 ENCOUNTER — Encounter: Payer: Medicare Other | Admitting: Nurse Practitioner

## 2022-08-24 ENCOUNTER — Encounter: Payer: Self-pay | Admitting: Pharmacy Technician

## 2022-08-24 ENCOUNTER — Other Ambulatory Visit (HOSPITAL_COMMUNITY): Payer: Self-pay

## 2022-08-24 ENCOUNTER — Other Ambulatory Visit: Payer: Self-pay | Admitting: Family

## 2022-08-24 VITALS — BP 136/81 | HR 75 | Resp 18 | Wt 353.2 lb

## 2022-08-24 DIAGNOSIS — F17210 Nicotine dependence, cigarettes, uncomplicated: Secondary | ICD-10-CM | POA: Insufficient documentation

## 2022-08-24 DIAGNOSIS — I10 Essential (primary) hypertension: Secondary | ICD-10-CM

## 2022-08-24 DIAGNOSIS — I5022 Chronic systolic (congestive) heart failure: Secondary | ICD-10-CM | POA: Diagnosis not present

## 2022-08-24 DIAGNOSIS — R058 Other specified cough: Secondary | ICD-10-CM | POA: Diagnosis not present

## 2022-08-24 DIAGNOSIS — R5383 Other fatigue: Secondary | ICD-10-CM | POA: Insufficient documentation

## 2022-08-24 DIAGNOSIS — Z79899 Other long term (current) drug therapy: Secondary | ICD-10-CM | POA: Diagnosis not present

## 2022-08-24 DIAGNOSIS — R531 Weakness: Secondary | ICD-10-CM | POA: Insufficient documentation

## 2022-08-24 DIAGNOSIS — F419 Anxiety disorder, unspecified: Secondary | ICD-10-CM

## 2022-08-24 DIAGNOSIS — G4733 Obstructive sleep apnea (adult) (pediatric): Secondary | ICD-10-CM | POA: Diagnosis not present

## 2022-08-24 DIAGNOSIS — R519 Headache, unspecified: Secondary | ICD-10-CM | POA: Diagnosis not present

## 2022-08-24 DIAGNOSIS — F32A Depression, unspecified: Secondary | ICD-10-CM

## 2022-08-24 DIAGNOSIS — Z7951 Long term (current) use of inhaled steroids: Secondary | ICD-10-CM | POA: Insufficient documentation

## 2022-08-24 DIAGNOSIS — G8929 Other chronic pain: Secondary | ICD-10-CM | POA: Diagnosis not present

## 2022-08-24 DIAGNOSIS — I110 Hypertensive heart disease with heart failure: Secondary | ICD-10-CM | POA: Insufficient documentation

## 2022-08-24 DIAGNOSIS — Z720 Tobacco use: Secondary | ICD-10-CM

## 2022-08-24 DIAGNOSIS — Z7984 Long term (current) use of oral hypoglycemic drugs: Secondary | ICD-10-CM | POA: Insufficient documentation

## 2022-08-24 DIAGNOSIS — R0602 Shortness of breath: Secondary | ICD-10-CM | POA: Diagnosis not present

## 2022-08-24 MED ORDER — SACUBITRIL-VALSARTAN 24-26 MG PO TABS: 1.0000 | ORAL_TABLET | Freq: Two times a day (BID) | ORAL | 3 refills | Status: DC

## 2022-08-24 MED ORDER — DAPAGLIFLOZIN PROPANEDIOL 10 MG PO TABS: 10.0000 mg | ORAL_TABLET | Freq: Every day | ORAL | 3 refills | Status: DC

## 2022-08-24 MED ORDER — DAPAGLIFLOZIN PROPANEDIOL 10 MG PO TABS: 10.0000 mg | ORAL_TABLET | Freq: Every day | ORAL | 5 refills | Status: DC

## 2022-08-24 NOTE — Patient Instructions (Signed)
Continue weighing daily and call for an overnight weight gain of 3 pounds or more or a weekly weight gain of more than 5 pounds.   If you have voicemail, please make sure your mailbox is cleaned out so that we may leave a message and please make sure to listen to any voicemails.     

## 2022-08-24 NOTE — TOC Benefit Eligibility Note (Signed)
Patient Teacher, English as a foreign language completed.    The patient is currently admitted and upon discharge could be taking Entresto 24-26 mg.  The current 30 day co-pay is $47.00.   The patient is insured through [place], [place] Patient Advocate Specialist [place] Patient Advocate Team Direct Number: [phone]  Fax: [phone]

## 2022-08-24 NOTE — Progress Notes (Signed)
[employer]ilder Glade and [employer] for patient assistance

## 2022-08-24 NOTE — Progress Notes (Signed)
[place] - PHARMACIST COUNSELING NOTE  Guideline-Directed Medical Therapy/Evidence Based Medicine  ACE/ARB/ARNI: Sacubitril-valsartan 97-103 mg twice daily Beta Blocker: Carvedilol 3.125 mg twice daily Aldosterone Antagonist: Spironolactone 25 mg daily Diuretic: Torsemide 40 mg twice daily SGLT2i: Dapagliflozin 10 mg daily  Adherence Assessment  Do you ever forget to take your medication? '[]'$ Yes '[x]'$ No  Do you ever skip doses due to side effects? '[x]'$ Yes '[]'$ No  Do you have trouble affording your medicines? '[x]'$ Yes '[]'$ No  Are you ever unable to pick up your medication due to transportation difficulties? '[]'$ Yes '[x]'$ No  Do you ever stop taking your medications because you don't believe they are helping? '[]'$ Yes '[x]'$ No  Do you check your weight daily? '[]'$ Yes '[x]'$ No   Adherence strategy: Takes medications in the morning out of pill bottles  Barriers to obtaining medications: Stopped taking Entresto and Farxiga around October due to affordability issues because of entering Medicare donut hole  Vital signs: HR 75, BP 136/81, weight (pounds) 353 ECHO: Date 02/04/2022, EF 20-25%, notes The left ventricle demonstrates global hypokinesis     Latest Ref Rng & Units 08/05/2022    2:48 PM 02/20/2022    2:03 PM 01/21/2022    9:50 AM  BMP  Glucose 70 - 99 mg/dL 109  134  133   BUN 6 - 20 mg/dL '15  10  15   '$ Creatinine 0.61 - 1.24 mg/dL 1.05  1.00  0.80   BUN/Creat Ratio 9 - 20  10    Sodium 135 - 145 mmol/L 139  141  136   Potassium 3.5 - 5.1 mmol/L 3.8  3.8  4.1   Chloride 98 - 111 mmol/L 103  103  104   CO2 22 - 32 mmol/L '27  24  24   '$ Calcium 8.9 - 10.3 mg/dL 8.9  9.2  9.6     Past Medical History:  Diagnosis Date   CHF (congestive heart failure) ([place])    Hypertension     ASSESSMENT 47 year old male with PMH hypertension who presents to the HF clinic for follow-up. Pt has HFrEF (last ECHO 20-25% in 01/2022). Regarding GDMT, patient currently taking  carvedilol 3.125 mg, dapagliflozin 10 mg, Entresto 200 mg, and spironolactone 25 mg. Patient also takes torsemide 40 mg PO twice daily. Patient is experiencing medication access issues due to entering Medicare donut hole around 05/2022 that prevented him from being able to fill dapagliflozin and Entresto. Pt endorses occasionally not taking furosemide and spironolactone if he needs to be somewhere that's difficult to access restroom. Patient also endorses that A1c is trending up (last drawn 01/2022) and will have a physical with his PCP on January 30 where he will have his A1c rechecked.  Recent ED Visit (past 6 months): Date - 08/05/2022, CC - chest pressure, SOB, and swelling  PLAN  CHF/HTN Resume taking Entresto and dapagliflozin now that we have confirmed copays back down to $47. Discussed with patient to consolidate prescriptions at one pharmacy to prevent him from having to go to so many medications. Reinforced the importance of checking weights so he is able to monitor to fluid status to prevent future ED visits.  Time spent: 20 minutes  Will M. Ouida Sills, PharmD PGY-1 Pharmacy Resident 08/24/2022 12:05 PM  Current Outpatient Medications:    albuterol (VENTOLIN HFA) 108 (90 Base) MCG/ACT inhaler, Inhale 2 puffs into the lungs every 6 (six) hours as needed for wheezing or shortness of breath., Disp: 18 g, Rfl: 0  aspirin EC 81 MG tablet, Take 81 mg by mouth daily., Disp: , Rfl:    carvedilol (COREG) 6.25 MG tablet, Take 1 tablet (6.25 mg total) by mouth 2 (two) times daily. (Patient taking differently: Take 3.125 mg by mouth 2 (two) times daily.), Disp: 180 tablet, Rfl: 3   cyclobenzaprine (FLEXERIL) 10 MG tablet, Take 10 mg by mouth 3 (three) times daily as needed for muscle spasms., Disp: , Rfl:    dapagliflozin propanediol (FARXIGA) 10 MG TABS tablet, Take 1 tablet (10 mg total) by mouth daily before breakfast., Disp: 30 tablet, Rfl: 5   docusate sodium (COLACE) 100 MG capsule, Take 100 mg  by mouth daily as needed for mild constipation., Disp: , Rfl:    sacubitril-valsartan (ENTRESTO) 24-26 MG, Take 1 tablet by mouth 2 (two) times daily., Disp: 60 tablet, Rfl: 3   spironolactone (ALDACTONE) 25 MG tablet, Take 1 tablet (25 mg total) by mouth daily., Disp: 90 tablet, Rfl: 3   torsemide (DEMADEX) 20 MG tablet, Take 40 mg by mouth 2 (two) times daily., Disp: , Rfl:    DRUGS TO CAUTION IN HEART FAILURE  Drug or Class Mechanism  Analgesics NSAIDs COX-2 inhibitors Glucocorticoids  Sodium and water retention, increased systemic vascular resistance, decreased response to diuretics   Diabetes Medications Metformin Thiazolidinediones Rosiglitazone (Avandia) Pioglitazone (Actos) DPP4 Inhibitors Saxagliptin (Onglyza) Sitagliptin (Januvia)   Lactic acidosis Possible calcium channel blockade   Unknown  Antiarrhythmics Class I  Flecainide Disopyramide Class III Sotalol Other Dronedarone  Negative inotrope, proarrhythmic   Proarrhythmic, beta blockade  Negative inotrope  Antihypertensives Alpha Blockers Doxazosin Calcium Channel Blockers Diltiazem Verapamil Nifedipine Central Alpha Adrenergics Moxonidine Peripheral Vasodilators Minoxidil  Increases renin and aldosterone  Negative inotrope    Possible sympathetic withdrawal  Unknown  Anti-infective Itraconazole Amphotericin B  Negative inotrope Unknown  Hematologic Anagrelide Cilostazol   Possible inhibition of PD IV Inhibition of PD III causing arrhythmias  Neurologic/Psychiatric Stimulants Anti-Seizure Drugs Carbamazepine Pregabalin Antidepressants Tricyclics Citalopram Parkinsons Bromocriptine Pergolide Pramipexole Antipsychotics Clozapine Antimigraine Ergotamine Methysergide Appetite suppressants Bipolar Lithium  Peripheral alpha and beta agonist activity  Negative inotrope and chronotrope Calcium channel blockade  Negative inotrope, proarrhythmic Dose-dependent QT  prolongation  Excessive serotonin activity/valvular damage Excessive serotonin activity/valvular damage Unknown  IgE mediated hypersensitivy, calcium channel blockade  Excessive serotonin activity/valvular damage Excessive serotonin activity/valvular damage Valvular damage  Direct myofibrillar degeneration, adrenergic stimulation  Antimalarials Chloroquine Hydroxychloroquine Intracellular inhibition of lysosomal enzymes  Urologic Agents Alpha Blockers Doxazosin Prazosin Tamsulosin Terazosin  Increased renin and aldosterone  Adapted from Page Carleene Overlie, et al. "Drugs That May Cause or Exacerbate Heart Failure: A Scientific Statement from the American Heart  Association." Circulation 2016; 134:e32-e69. DOI: 10.1161/CIR.0000000000000426   MEDICATION ADHERENCES TIPS AND STRATEGIES Taking medication as prescribed improves patient outcomes in heart failure (reduces hospitalizations, improves symptoms, increases survival) Side effects of medications can be managed by decreasing doses, switching agents, stopping drugs, or adding additional therapy. Please let someone in the [facility] know if you have having bothersome side effects so we can modify your regimen. Do not alter your medication regimen without talking to [place].  Medication reminders can help patients remember to take drugs on time. If you are missing or forgetting doses you can try linking behaviors, using pill boxes, or an electronic reminder like an alarm on your phone or an app. Some people can also get automated phone calls as medication reminders.

## 2022-08-25 ENCOUNTER — Telehealth: Payer: Self-pay | Admitting: Family

## 2022-08-25 NOTE — Telephone Encounter (Signed)
Patient was approved for [employer] patient assistance for Entresto until 08/17/23   Museum/gallery conservator, NT

## 2022-09-15 DIAGNOSIS — M79671 Pain in right foot: Secondary | ICD-10-CM | POA: Diagnosis not present

## 2022-09-15 DIAGNOSIS — I509 Heart failure, unspecified: Secondary | ICD-10-CM | POA: Diagnosis not present

## 2022-09-15 DIAGNOSIS — Z23 Encounter for immunization: Secondary | ICD-10-CM | POA: Diagnosis not present

## 2022-09-15 DIAGNOSIS — G4733 Obstructive sleep apnea (adult) (pediatric): Secondary | ICD-10-CM | POA: Diagnosis not present

## 2022-09-22 NOTE — Progress Notes (Deleted)
Patient ID: Marc [name], male    DOB: Sep 24, 1975, 47 y.o.   MRN: [id number]   Marc [name] is a 47 y/o male with a history of HTN, chronic pain, bipolar, current tobacco use and chronic heart failure.   Echo report from 02/04/22 reviewed and showed an EF of 20-25%. Echo report from 10/10/2018 reviewed and showed an EF of 20-25% along with moderate Marc/TR.   RHC/LHC done 09/19/20 and showed: Severely depressed overall left ventricular function EF less than 25% Severely enlarged left ventricular chamber Left main large free of disease LAD was large and free of disease Circumflex was large and free of disease left dominant Right heart cath showed no evidence of pulmonary hypertension Mean PA was 23 mean wedge of 10 Cardiac output of 7.6 Fick  Was in the ED 08/05/22 due to chest pain and pulmonary edema. Discharged the next day.   He presents today for a follow-up visit with a chief complaint of   Past Medical History:  Diagnosis Date   CHF (congestive heart failure) ([place])    Hypertension    Past Surgical History:  Procedure Laterality Date   BICEPS TENDON REPAIR     COLONOSCOPY WITH PROPOFOL N/A 10/09/2020   Procedure: COLONOSCOPY WITH PROPOFOL;  Surgeon: Lin Landsman, MD;  Location: [facility] ENDOSCOPY;  Service: Gastroenterology;  Laterality: N/A;   FLEXIBLE SIGMOIDOSCOPY N/A 10/30/2020   Procedure: FLEXIBLE SIGMOIDOSCOPY;  Surgeon: Lin Landsman, MD;  Location: [facility] ENDOSCOPY;  Service: Gastroenterology;  Laterality: N/A;   FOOT SURGERY     metataral and fasciotomy   RIGHT/LEFT HEART CATH AND CORONARY ANGIOGRAPHY Bilateral 09/19/2020   Procedure: RIGHT/LEFT HEART CATH AND CORONARY ANGIOGRAPHY;  Surgeon: Yolonda Kida, MD;  Location: [facility] CV LAB;  Service: Cardiovascular;  Laterality: Bilateral;   ROTATOR CUFF REPAIR     Family History  Problem Relation Age of Onset   Stroke Mother    Dementia Mother    Hypertension Father    Diabetes Father     Hypertension Paternal Grandfather    Diabetes Paternal Grandfather    Social History   Tobacco Use   Smoking status: Every Day    Packs/day: 0.50    Years: 25.00    Total pack years: 12.50    Types: Cigarettes   Smokeless tobacco: Never  Substance Use Topics   Alcohol use: Not Currently   Allergies  Allergen Reactions   Geodon [Ziprasidone] Other (See Comments)    paralysis   Tramadol Other (See Comments)    Pt stated that it gave him sores.   Amoxicillin Rash   Zithromax [Azithromycin] Rash    Review of Systems  Constitutional:  Positive for fatigue (tired "all the time"). Negative for appetite change.  HENT:  Negative for congestion, postnasal drip and sore throat.   Eyes: Negative.   Respiratory:  Positive for cough (at times) and shortness of breath. Negative for chest tightness and wheezing.   Cardiovascular:  Positive for chest pain (at times). Negative for palpitations and leg swelling.  Gastrointestinal:  Negative for abdominal distention and abdominal pain.  Endocrine: Negative.   Genitourinary: Negative.   Musculoskeletal:  Positive for arthralgias (left knee), back pain and neck pain.  Skin: Negative.   Allergic/Immunologic: Negative.   Neurological:  Positive for weakness (legs) and headaches. Negative for dizziness and light-headedness.  Hematological:  Negative for adenopathy. Does not bruise/bleed easily.  Psychiatric/Behavioral:  Positive for sleep disturbance (trouble falling asleep; wearing CPAP). Negative for dysphoric mood. The  patient is not nervous/anxious.      Physical Exam Vitals and nursing note reviewed.  HENT:     Head: Normocephalic and atraumatic.  Cardiovascular:     Rate and Rhythm: Normal rate and regular rhythm.  Pulmonary:     Effort: Pulmonary effort is normal.     Breath sounds: No wheezing or rales.     Comments: Diminished due to chest wall size Abdominal:     General: There is no distension.     Palpations: Abdomen is  soft.     Tenderness: There is no abdominal tenderness.  Musculoskeletal:        General: No tenderness.     Right shoulder: No tenderness.     Cervical back: Normal range of motion and neck supple.     Right lower leg: No tenderness. No edema.     Left lower leg: No tenderness. No edema.  Skin:    General: Skin is warm and dry.  Neurological:     General: No focal deficit present.     Mental Status: He is alert and oriented to person, place, and time.  Psychiatric:        Attention and Perception: Attention normal.        Mood and Affect: Mood is not anxious or depressed.        Behavior: Behavior normal.     Assessment & Plan:  1. Chronic heart failure with reduced ejection fraction- - NYHA class III - euvolemic today - weighing daily; reminded to call for an overnight weight gain of >2 pounds or a weekly weight gain of >5 pounds - weight 353.4 pounds from last visit here 1 month ago - cooks with himalayan pink salt and reviewed sodium content of this; encouraged him to add very little to food when cooking as it still has high sodium content (~ '400mg'$ / 1/4 teaspoon); does not add any salt to the food after cooking - eats out weekly on Saturdays after church and also doesn't take his AM torsemide that day; takes the PM dose and spironolactone when he gets home but does notice that he's more fatigued on Sundays - saw cardiology Clayborn Bigness) 01/27/22; needs to call and schedule a f/u appointment - on GDMT of carvedilol, entresto, farxiga & spironolactone - entresto 24/'26mg'$  BID  - farxiga '10mg'$  QD - BMP today as farxiga/ entresto started last visit - BNP 08/05/22 was 93.0 - PharmD reconciled meds w/patient  2: HTN- - BP  - saw PCP Mathis Dad) 02/20/22; changing to [name] P[facility] on 09/15/22 - BMP from 08/05/22 reviewed and showed sodium 139, potassium 3.8, creatinine 1.05 and GFR >60  3: Anxiety/ depression- - patient says that previously he's talked to the therapist at [name] - appears to be under control at this time  4: Tobacco use- - smoking ~ 1/2 ppd of cigarettes - denies alcohol or drug use - complete cessation discussed for 3 minutes with the patient; discussed substituting cigarette with a sugar free lollipop or toothpick - he says that he's quit a couple of times before and has used toothpicks to help replace the cigarette  5: Obstructive sleep apnea- - saw pulmonology Humphrey Rolls) 11/18/20; NS 05/04/22 and advised him to contact them to r/s this appointment - wearing his CPAP nightly & reports resting well   Patient did not bring his medications nor a list. Each medication was verbally reviewed with the patient and he was encouraged to bring the bottles to every visit to confirm  accuracy of list.

## 2022-09-23 ENCOUNTER — Encounter: Payer: Medicare Other | Admitting: Family

## 2022-09-24 ENCOUNTER — Telehealth: Payer: Self-pay | Admitting: Family

## 2022-09-24 NOTE — Telephone Encounter (Signed)
Patient was approved patient assistance for Farxiga until 08/17/23 and patient has been receiving shippment.   Davan Nawabi, [place]

## 2022-10-07 ENCOUNTER — Encounter: Payer: Self-pay | Admitting: Family

## 2022-10-07 ENCOUNTER — Other Ambulatory Visit
Admission: RE | Admit: 2022-10-07 | Discharge: 2022-10-07 | Disposition: A | Discharge status: Home / Self Care | Payer: Medicare Other | Source: Ambulatory Visit | Attending: Family | Admitting: Family

## 2022-10-07 ENCOUNTER — Ambulatory Visit (HOSPITAL_BASED_OUTPATIENT_CLINIC_OR_DEPARTMENT_OTHER): Payer: Medicare Other | Admitting: Family

## 2022-10-07 ENCOUNTER — Encounter: Payer: Self-pay | Admitting: Pharmacist

## 2022-10-07 VITALS — BP 134/83 | HR 95 | Resp 20 | Wt 349.0 lb

## 2022-10-07 DIAGNOSIS — I10 Essential (primary) hypertension: Secondary | ICD-10-CM | POA: Diagnosis not present

## 2022-10-07 DIAGNOSIS — F17210 Nicotine dependence, cigarettes, uncomplicated: Secondary | ICD-10-CM | POA: Insufficient documentation

## 2022-10-07 DIAGNOSIS — Z720 Tobacco use: Secondary | ICD-10-CM | POA: Diagnosis not present

## 2022-10-07 DIAGNOSIS — I5022 Chronic systolic (congestive) heart failure: Secondary | ICD-10-CM | POA: Insufficient documentation

## 2022-10-07 DIAGNOSIS — R0602 Shortness of breath: Secondary | ICD-10-CM | POA: Insufficient documentation

## 2022-10-07 DIAGNOSIS — Z7984 Long term (current) use of oral hypoglycemic drugs: Secondary | ICD-10-CM | POA: Insufficient documentation

## 2022-10-07 DIAGNOSIS — G4733 Obstructive sleep apnea (adult) (pediatric): Secondary | ICD-10-CM | POA: Insufficient documentation

## 2022-10-07 DIAGNOSIS — I110 Hypertensive heart disease with heart failure: Secondary | ICD-10-CM | POA: Insufficient documentation

## 2022-10-07 DIAGNOSIS — G8929 Other chronic pain: Secondary | ICD-10-CM | POA: Insufficient documentation

## 2022-10-07 DIAGNOSIS — Z79899 Other long term (current) drug therapy: Secondary | ICD-10-CM | POA: Insufficient documentation

## 2022-10-07 DIAGNOSIS — F32A Depression, unspecified: Secondary | ICD-10-CM | POA: Diagnosis not present

## 2022-10-07 DIAGNOSIS — F419 Anxiety disorder, unspecified: Secondary | ICD-10-CM | POA: Insufficient documentation

## 2022-10-07 LAB — BASIC METABOLIC PANEL
Anion gap: 7 (ref 5–15)
BUN: 15 mg/dL (ref 6–20)
CO2: 26 mmol/L (ref 22–32)
Calcium: 8.8 mg/dL — ABNORMAL LOW (ref 8.9–10.3)
Chloride: 102 mmol/L (ref 98–111)
Creatinine, Ser: 0.92 mg/dL (ref 0.61–1.24)
GFR, Estimated: >60 mL/min (ref >60)
Glucose, Bld: 85 mg/dL (ref 70–99)
Potassium: 3.6 mmol/L (ref 3.5–5.1)
Sodium: 135 mmol/L (ref 135–145)

## 2022-10-07 NOTE — Progress Notes (Signed)
Patient ID: Marc [name], male    DOB: 05/28/1976, 47 y.o.   MRN: [id number]   Marc [name] is a 47 y/o male with a history of HTN, chronic pain, bipolar, current tobacco use and chronic heart failure.   Echo report from 02/04/22 reviewed and showed an EF of 20-25%. Echo report from 10/10/2018 reviewed and showed an EF of 20-25% along with moderate Marc/TR.   RHC/LHC done 09/19/20 and showed: Severely depressed overall left ventricular function EF less than 25% Severely enlarged left ventricular chamber Left main large free of disease LAD was large and free of disease Circumflex was large and free of disease left dominant Right heart cath showed no evidence of pulmonary hypertension Mean PA was 23 mean wedge of 10 Cardiac output of 7.6 Fick  Was in the ED 08/05/22 due to chest pain and pulmonary edema. Discharged the next day.   He presents today for a follow-up visit with a chief complaint of moderate fatigue with minimal exertion. Describes this as chronic in nature. Has associated cough, SOB, headaches and chronic pain along with this. Denies any difficulty sleeping, abdominal distention, palpitations, pedal edema, chest pain, wheezing or weight gain.   Taking torsemide daily and extra if needed. Says that he hasn't taken any extra doses since last week.   Past Medical History:  Diagnosis Date   CHF (congestive heart failure) (HCC)    Hypertension    Past Surgical History:  Procedure Laterality Date   BICEPS TENDON REPAIR     COLONOSCOPY WITH PROPOFOL N/A 10/09/2020   Procedure: COLONOSCOPY WITH PROPOFOL;  Surgeon: Lin Landsman, MD;  Location: [facility] ENDOSCOPY;  Service: Gastroenterology;  Laterality: N/A;   FLEXIBLE SIGMOIDOSCOPY N/A 10/30/2020   Procedure: FLEXIBLE SIGMOIDOSCOPY;  Surgeon: Lin Landsman, MD;  Location: [facility] ENDOSCOPY;  Service: Gastroenterology;  Laterality: N/A;   FOOT SURGERY     metataral and fasciotomy   RIGHT/LEFT HEART CATH AND CORONARY  ANGIOGRAPHY Bilateral 09/19/2020   Procedure: RIGHT/LEFT HEART CATH AND CORONARY ANGIOGRAPHY;  Surgeon: Yolonda Kida, MD;  Location: [place] [facility];  Service: Cardiovascular;  Laterality: Bilateral;   ROTATOR CUFF REPAIR     Family History  Problem Relation Age of Onset   Stroke Mother    Dementia Mother    Hypertension Father    Diabetes Father    Hypertension Paternal Grandfather    Diabetes Paternal Grandfather    Social History   Tobacco Use   Smoking status: Every Day    Packs/day: 0.50    Years: 25.00    Total pack years: 12.50    Types: Cigarettes   Smokeless tobacco: Never  Substance Use Topics   Alcohol use: Not Currently   Allergies  Allergen Reactions   Geodon [Ziprasidone] Other (See Comments)    paralysis   Tramadol Other (See Comments)    Pt stated that it gave him sores.   Amoxicillin Rash   Zithromax [Azithromycin] Rash   Prior to Admission medications   Medication Sig Start Date End Date Taking? Authorizing Provider  albuterol (VENTOLIN HFA) 108 (90 Base) MCG/ACT inhaler Inhale 2 puffs into the lungs every 6 (six) hours as needed for wheezing or shortness of breath. 08/06/22  Yes Shelly Coss, MD  aspirin EC 81 MG tablet Take 81 mg by mouth daily.   Yes [provider]  carvedilol (COREG) 3.125 MG tablet Take 3.125 mg by mouth 2 (two) times daily. 08/20/22  Yes [provider]  cyclobenzaprine (FLEXERIL) 10  MG tablet Take 10 mg by mouth 3 (three) times daily as needed for muscle spasms. 10/16/21  Yes [provider]  dapagliflozin propanediol (FARXIGA) 10 MG TABS tablet Take 1 tablet (10 mg total) by mouth daily before breakfast. 08/24/22  Yes Darylene Price A, FNP  docusate sodium (COLACE) 100 MG capsule Take 100 mg by mouth daily as needed for mild constipation.   Yes [provider]  sacubitril-valsartan (ENTRESTO) 24-26 MG Take 1 tablet by mouth 2 (two) times daily. 08/24/22  Yes Darylene Price A, FNP   spironolactone (ALDACTONE) 25 MG tablet Take 1 tablet (25 mg total) by mouth daily. 02/18/22  Yes Gricelda Foland, Otila Kluver A, FNP  torsemide (DEMADEX) 20 MG tablet Take 40 mg by mouth 2 (two) times daily. 62m daily plus 431mas needed for fluid retention   Yes [provider]   Review of Systems  Constitutional:  Positive for fatigue (tired "all the time"). Negative for appetite change.  HENT:  Negative for congestion, postnasal drip and sore throat.   Eyes: Negative.   Respiratory:  Positive for cough (at times) and shortness of breath. Negative for chest tightness and wheezing.   Cardiovascular:  Negative for chest pain, palpitations and leg swelling.  Gastrointestinal:  Negative for abdominal distention and abdominal pain.  Endocrine: Negative.   Genitourinary: Negative.   Musculoskeletal:  Positive for arthralgias (left knee), back pain and neck pain.  Skin: Negative.   Allergic/Immunologic: Negative.   Neurological:  Positive for weakness (legs) and headaches (improving). Negative for dizziness and light-headedness.  Hematological:  Negative for adenopathy. Does not bruise/bleed easily.  Psychiatric/Behavioral:  Negative for dysphoric mood and sleep disturbance (sleeping on 3 pillows; wearing CPAP). The patient is not nervous/anxious.    Vitals:   10/07/22 0859  BP: 134/83  Pulse: 95  Resp: 20  SpO2: 99%  Weight: (!) 349 lb (158.3 kg)   Wt Readings from Last 3 Encounters:  10/07/22 (!) 349 lb (158.3 kg)  08/24/22 (!) 353 lb 4 oz (160.2 kg)  08/05/22 (!) 348 lb (157.9 kg)   Lab Results  Component Value Date   CREATININE 1.05 08/05/2022   CREATININE 1.00 02/20/2022   CREATININE 0.80 01/21/2022   Physical Exam Vitals and nursing note reviewed.  HENT:     Head: Normocephalic and atraumatic.  Cardiovascular:     Rate and Rhythm: Normal rate and regular rhythm.  Pulmonary:     Effort: Pulmonary effort is normal.     Breath sounds: No wheezing or rales.     Comments:  Diminished due to chest wall size Abdominal:     General: There is no distension.     Palpations: Abdomen is soft.     Tenderness: There is no abdominal tenderness.  Musculoskeletal:        General: No tenderness.     Right shoulder: No tenderness.     Cervical back: Normal range of motion and neck supple.     Right lower leg: No tenderness. No edema.     Left lower leg: No tenderness. No edema.  Skin:    General: Skin is warm and dry.  Neurological:     General: No focal deficit present.     Mental Status: He is alert and oriented to person, place, and time.  Psychiatric:        Attention and Perception: Attention normal.        Mood and Affect: Mood is not anxious or depressed.  Behavior: Behavior normal.     Assessment & Plan:  1. Chronic heart failure with reduced ejection fraction- - NYHA class III - euvolemic today - weighing daily; reminded to call for an overnight weight gain of >2 pounds or a weekly weight gain of >5 pounds - weight down 4 pounds from last visit here 1 month ago - eats out weekly on Saturdays after church - saw cardiology Clay[facility] Bigness) 01/27/22; needs to call and schedule a f/u appointment - spironolactone 40m daily - entresto 24/228mBID  - farxiga 1027mD - carvedilol 3.125m21mD - BMP today as farxiga/ entresto started last visit; discussed titrating meds at future appts - RHC/ LHC done 09/2020 - will update echo and refer to [facility] MD for any further ischemic work-up - BNP 08/05/22 was 93.0 - PharmD reconciled meds w/patient  2: HTN- - BP 134/82 - seeing PCP at [facility]  - BMP from 08/05/22 reviewed and showed sodium 139, potassium 3.8, creatinine 1.05 and GFR >60  3: Anxiety/ depression- - patient says that previously he's talked to the [facility] to be under control at this time  4: Tobacco use- - smoking ~ 1/2 ppd of cigarettes - denies alcohol or drug use - complete cessation discussed for 3  minutes with the patient; discussed substituting cigarette with a sugar free lollipop or toothpick - he says that he's quit a couple of times before and has used toothpicks to help replace the cigarette  5: Obstructive sleep apnea- - saw pulmonology ([facility] Rolls4/22; NS 05/04/22 and advised him to contact them to r/s this appointment - wearing his CPAP nightly & reports resting well   Patient did not bring his medications nor a list. Each medication was verbally reviewed with the patient and he was encouraged to bring the bottles to every visit to confirm accuracy of list.   Return in 1 month, sooner if needed

## 2022-10-07 NOTE — Patient Instructions (Signed)
Continue weighing daily and call for an overnight weight gain of 3 pounds or more or a weekly weight gain of more than 5 pounds. ? ?

## 2022-10-08 NOTE — Progress Notes (Signed)
[facility] - PHARMACIST COUNSELING NOTE  Guideline-Directed Medical Therapy/Evidence Based Medicine  ACE/ARB/ARNI: Sacubitril-valsartan 24-26 mg twice daily Beta Blocker: Carvedilol 3.125 mg twice daily Aldosterone Antagonist: Spironolactone 25 mg daily Diuretic: Torsemide 40 mg daily and 56m PRN SGLT2i: Dapagliflozin 10 mg daily  Adherence Assessment  Do you ever forget to take your medication? []$ Yes [x]$ No  Do you ever skip doses due to side effects? []$ Yes [x]$ No  Do you have trouble affording your medicines? []$ Yes [x]$ No  Are you ever unable to pick up your medication due to transportation difficulties? []$ Yes [x]$ No  Do you ever stop taking your medications because you don't believe they are helping? []$ Yes [x]$ No  Do you check your weight daily? [x]$ Yes []$ No   Adherence strategy: none  Barriers to obtaining medications: none  Vital signs: HR 95, BP 134/83, weight (pounds) 349 lb  ECHO: Date 2/24/202, EF 20-25%, notes moderate Marc/TR  Cath: Date 09/19/20, EF 25%, notes severely enlarged LV     Latest Ref Rng & Units 10/07/2022    9:54 AM 08/05/2022    2:48 PM 02/20/2022    2:03 PM  BMP  Glucose 70 - 99 mg/dL 85  109  134   BUN 6 - 20 mg/dL 15  15  10   $ Creatinine 0.61 - 1.24 mg/dL 0.92  1.05  1.00   BUN/Creat Ratio 9 - 20   10   Sodium 135 - 145 mmol/L 135  139  141   Potassium 3.5 - 5.1 mmol/L 3.6  3.8  3.8   Chloride 98 - 111 mmol/L 102  103  103   CO2 22 - 32 mmol/L 26  27  24   $ Calcium 8.9 - 10.3 mg/dL 8.8  8.9  9.2     Past Medical History:  Diagnosis Date   CHF (congestive heart failure) (HAvery    Hypertension     ASSESSMENT 47year old male who presents to the HF clinic for follow up. PMH includes HTN, chronic pain, bipolar, current tobacco use and chronic heart failure. Last ED visit was on 08/15/2022 for pulmonary edema. Patient previously on higher dos of Entresto and carvedilol, both decreased due to  hypotension.  Medication reconciliation competed during this visit. Marc BDalesiodenies problems with current therapy or issue affording medication.   PLAN (recommendations) Repeat BMP today Continue current medication without changes Follow up as recommended by NP  Time spent: 15 minutes  Marc [name] PharmD, BCPS 10/08/2022 8:12 AM   Current Outpatient Medications:    albuterol (VENTOLIN HFA) 108 (90 Base) MCG/ACT inhaler, Inhale 2 puffs into the lungs every 6 (six) hours as needed for wheezing or shortness of breath., Disp: 18 g, Rfl: 0   aspirin EC 81 MG tablet, Take 81 mg by mouth daily., Disp: , Rfl:    carvedilol (COREG) 3.125 MG tablet, Take 3.125 mg by mouth 2 (two) times daily., Disp: , Rfl:    cyclobenzaprine (FLEXERIL) 10 MG tablet, Take 10 mg by mouth 3 (three) times daily as needed for muscle spasms., Disp: , Rfl:    dapagliflozin propanediol (FARXIGA) 10 MG TABS tablet, Take 1 tablet (10 mg total) by mouth daily before breakfast., Disp: 90 tablet, Rfl: 3   docusate sodium (COLACE) 100 MG capsule, Take 100 mg by mouth daily as needed for mild constipation., Disp: , Rfl:    sacubitril-valsartan (ENTRESTO) 24-26 MG, Take 1 tablet by mouth 2 (two) times daily., Disp: 180 tablet, Rfl: 3  spironolactone (ALDACTONE) 25 MG tablet, Take 1 tablet (25 mg total) by mouth daily., Disp: 90 tablet, Rfl: 3   torsemide (DEMADEX) 20 MG tablet, Take 40 mg by mouth 2 (two) times daily. 12m daily plus 452mas needed for fluid retention, Disp: , Rfl:    MEDICATION ADHERENCES TIPS AND STRATEGIES Taking medication as prescribed improves patient outcomes in heart failure (reduces hospitalizations, improves symptoms, increases survival) Side effects of medications can be managed by decreasing doses, switching agents, stopping drugs, or adding additional therapy. Please let someone in the [facility] if you have having bothersome side effects so we can modify your regimen. Do  not alter your medication regimen without talking to [place] Medication reminders can help patients remember to take drugs on time. If you are missing or forgetting doses you can try linking behaviors, using pill boxes, or an electronic reminder like an alarm on your phone or an app. Some people can also get automated phone calls as medication reminders.

## 2022-10-20 DIAGNOSIS — Z1389 Encounter for screening for other disorder: Secondary | ICD-10-CM | POA: Diagnosis not present

## 2022-10-20 DIAGNOSIS — Z0000 Encounter for general adult medical examination without abnormal findings: Secondary | ICD-10-CM | POA: Diagnosis not present

## 2022-10-20 DIAGNOSIS — Z23 Encounter for immunization: Secondary | ICD-10-CM | POA: Diagnosis not present

## 2022-10-24 ENCOUNTER — Other Ambulatory Visit: Payer: Self-pay | Admitting: Family

## 2022-10-27 ENCOUNTER — Telehealth: Payer: Self-pay | Admitting: Family

## 2022-10-27 NOTE — Telephone Encounter (Signed)
Incoming fax from [employer] confirms shipment was sent via [employer] [id number] on 3.7.24 with tracking # [id number] to be sent to pt's address on file.

## 2022-11-04 ENCOUNTER — Other Ambulatory Visit
Admission: RE | Admit: 2022-11-04 | Discharge: 2022-11-04 | Disposition: A | Discharge status: Home / Self Care | Payer: Medicare Other | Source: Ambulatory Visit | Attending: Family | Admitting: Family

## 2022-11-04 ENCOUNTER — Ambulatory Visit
Admission: RE | Admit: 2022-11-04 | Discharge: 2022-11-04 | Disposition: A | Discharge status: Home / Self Care | Payer: Medicare Other | Source: Ambulatory Visit | Attending: Family | Admitting: Family

## 2022-11-04 ENCOUNTER — Encounter: Payer: Self-pay | Admitting: Cardiology

## 2022-11-04 ENCOUNTER — Ambulatory Visit (HOSPITAL_BASED_OUTPATIENT_CLINIC_OR_DEPARTMENT_OTHER): Payer: Medicare Other | Admitting: Cardiology

## 2022-11-04 VITALS — BP 104/77 | HR 82 | Wt 350.5 lb

## 2022-11-04 DIAGNOSIS — G4733 Obstructive sleep apnea (adult) (pediatric): Secondary | ICD-10-CM | POA: Insufficient documentation

## 2022-11-04 DIAGNOSIS — I5022 Chronic systolic (congestive) heart failure: Secondary | ICD-10-CM | POA: Insufficient documentation

## 2022-11-04 DIAGNOSIS — R0602 Shortness of breath: Secondary | ICD-10-CM | POA: Insufficient documentation

## 2022-11-04 DIAGNOSIS — I428 Other cardiomyopathies: Secondary | ICD-10-CM | POA: Diagnosis not present

## 2022-11-04 DIAGNOSIS — M79604 Pain in right leg: Secondary | ICD-10-CM | POA: Diagnosis not present

## 2022-11-04 DIAGNOSIS — M545 Low back pain, unspecified: Secondary | ICD-10-CM | POA: Diagnosis not present

## 2022-11-04 DIAGNOSIS — I110 Hypertensive heart disease with heart failure: Secondary | ICD-10-CM | POA: Insufficient documentation

## 2022-11-04 DIAGNOSIS — Z6841 Body Mass Index (BMI) 40.0 and over, adult: Secondary | ICD-10-CM | POA: Insufficient documentation

## 2022-11-04 DIAGNOSIS — Z79899 Other long term (current) drug therapy: Secondary | ICD-10-CM | POA: Diagnosis not present

## 2022-11-04 DIAGNOSIS — M79605 Pain in left leg: Secondary | ICD-10-CM | POA: Insufficient documentation

## 2022-11-04 LAB — BASIC METABOLIC PANEL
Anion gap: 5 (ref 5–15)
BUN: 13 mg/dL (ref 6–20)
CO2: 25 mmol/L (ref 22–32)
Calcium: 9.1 mg/dL (ref 8.9–10.3)
Chloride: 108 mmol/L (ref 98–111)
Creatinine, Ser: 0.98 mg/dL (ref 0.61–1.24)
GFR, Estimated: >60 mL/min (ref >60)
Glucose, Bld: 93 mg/dL (ref 70–99)
Potassium: 3.8 mmol/L (ref 3.5–5.1)
Sodium: 138 mmol/L (ref 135–145)

## 2022-11-04 LAB — ECHOCARDIOGRAM COMPLETE: S' Lateral: 6.6 cm

## 2022-11-04 LAB — BRAIN NATRIURETIC PEPTIDE: B Natriuretic Peptide: 41.1 pg/mL (ref 0.0–100.0)

## 2022-11-04 NOTE — Progress Notes (Signed)
*  PRELIMINARY RESULTS* Echocardiogram 2D Echocardiogram has been performed.  Sherrie Sport 11/04/2022, 10:29 AM

## 2022-11-04 NOTE — Progress Notes (Unsigned)
ADVANCED HEART FAILURE CLINIC NOTE  Referring Physician: Jon Billings, NP  Primary Care: Jon Billings, NP Primary Cardiologist:  HPI: Marc [name] is a 47 y.o. male   Based on [facility] records he had a stress MRI in 2011 with an LVEF of 35%.   [name] reports first being diagnosed with systolic heart failure around 2015 at [facility]. He was diagnosed with the flu around that time followed by persistent SOB and diagnosis of nonischemic cardiomyopathy. 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    Activity level/exercise tolerance:  NYHA III, limited by significant fatigue and neuropathic pain (sciatica); at times he reports being able to barely stand.  Orthopnea:  Sleeps on 3 pillows Paroxysmal noctural dyspnea:  No Chest pain/pressure:  No Orthostatic lightheadedness:  No Palpitations:  No Lower extremity edema:  No Presyncope/syncope:  No Cough:  No  Past Medical History:  Diagnosis Date   CHF (congestive heart failure) (HCC)    Hypertension     Current Outpatient Medications  Medication Sig Dispense Refill   albuterol (VENTOLIN HFA) 108 (90 Base) MCG/ACT inhaler Inhale 2 puffs into the lungs every 6 (six) hours as needed for wheezing or shortness of breath. 18 g 0   aspirin EC 81 MG tablet Take 81 mg by mouth daily.     carvedilol (COREG) 3.125 MG tablet Take 3.125 mg by mouth 2 (two) times daily.     cyclobenzaprine (FLEXERIL) 10 MG tablet Take 10 mg by mouth 3 (three) times daily as needed for muscle spasms.     dapagliflozin propanediol (FARXIGA) 10 MG TABS tablet Take 1 tablet (10 mg total) by mouth daily before breakfast. 90 tablet 3   docusate sodium (COLACE) 100 MG capsule Take 100 mg by mouth daily as needed for mild constipation.     sacubitril-valsartan (ENTRESTO) 24-26 MG Take 1 tablet by mouth 2 (two) times daily. 180 tablet 3   spironolactone (ALDACTONE) 25 MG tablet TAKE 1 TABLET BY MOUTH DAILY 100 tablet 2   torsemide (DEMADEX) 20 MG  tablet Take 40 mg by mouth daily. Extra 40 mg in PM as needed     No current facility-administered medications for this visit.    Allergies  Allergen Reactions   Geodon [Ziprasidone] Other (See Comments)    paralysis   Tramadol Other (See Comments)    Pt stated that it gave him sores.   Amoxicillin Rash   Zithromax [Azithromycin] Rash      Social History   Socioeconomic History   Marital status: Married    Spouse name: Not on file   Number of children: Not on file   Years of education: Not on file   Highest education level: Not on file  Occupational History   Not on file  Tobacco Use   Smoking status: Every Day    Packs/day: 0.50    Years: 25.00    Additional pack years: 0.00    Total pack years: 12.50    Types: Cigarettes   Smokeless tobacco: Never  Vaping Use   Vaping Use: Never used  Substance and Sexual Activity   Alcohol use: Not Currently   Drug use: Not Currently   Sexual activity: Yes  Other Topics Concern   Not on file  Social History Narrative   Not on file   Social Determinants of Health   Financial Resource Strain: Low Risk  (05/26/2022)   Overall Financial Resource Strain (CARDIA)    Difficulty of Paying Living Expenses: Not  hard at all  Food Insecurity: No Food Insecurity (05/26/2022)   Hunger Vital Sign    Worried About Running Out of Food in the Last Year: Never true    Ran Out of Food in the Last Year: Never true  Transportation Needs: No Transportation Needs (07/19/2019)   PRAPARE - Hydrologist (Medical): No    Lack of Transportation (Non-Medical): No  Physical Activity: Inactive (05/26/2022)   Exercise Vital Sign    Days of Exercise per Week: 0 days    Minutes of Exercise per Session: 0 min  Stress: Stress Concern Present (05/26/2022)   [place]    Feeling of Stress : To some extent  Social Connections: Moderately Integrated (05/26/2022)    Social Connection and Isolation Panel [NHANES]    Frequency of Communication with Friends and Family: Once a week    Frequency of Social Gatherings with Friends and Family: Twice a week    Attends Religious Services: More than 4 times per year    Active Member of [employer] or Organizations: No    Attends Archivist Meetings: Never    Marital Status: Married  Human resources officer Violence: Not At Risk (05/26/2022)   Humiliation, Afraid, Rape, and Kick questionnaire    Fear of Current or Ex-Partner: No    Emotionally Abused: No    Physically Abused: No    Sexually Abused: No      Family History  Problem Relation Age of Onset   Stroke Mother    Dementia Mother    Hypertension Father    Diabetes Father    Hypertension Paternal Grandfather    Diabetes Paternal Grandfather     PHYSICAL EXAM: Vitals:   11/04/22 1044  BP: 104/77  Pulse: 82  SpO2: 99%   GENERAL: Well nourished, well developed, and in no apparent distress at rest.  HEENT: Negative for arcus senilis or xanthelasma. There is no scleral icterus.  The mucous membranes are pink and moist.   NECK: Supple, No masses. Normal carotid upstrokes without bruits. No masses or thyromegaly.    CHEST: There are no chest wall deformities. There is no chest wall tenderness. Respirations are unlabored.  Lungs- *** CARDIAC:  JVP: *** cm H2O         {HEART SOUNDS:22645}  Normal rate with regular rhythm. No murmurs, rubs or gallops.  Pulses are 2+ and symmetrical in upper and lower extremities. *** edema.  ABDOMEN: Soft, non-tender, non-distended. There are no masses or hepatomegaly. There are normal bowel sounds.  EXTREMITIES: Warm and well perfused with no cyanosis, clubbing.  LYMPHATIC: No axillary or supraclavicular lymphadenopathy.  NEUROLOGIC: Patient is oriented x3 with no focal or lateralizing neurologic deficits.  PSYCH: Patients affect is appropriate, there is no evidence of anxiety or depression.  SKIN: Warm and dry; no  lesions or wounds.   DATA REVIEW  ECG: ***  As per my personal interpretation  ECHO: 11/04/22: LVEF 20-25% with global hypokinesis  as per my personal interpretation 10/10/18 ([facility]): LVEF 20-25%, RV with mildly reduced function.   CATH: 09/19/20:  Severely depressed overall left ventricular function EF less than 25% Severely enlarged left ventricular chamber Left main large free of disease LAD was large and free of disease Circumflex was large and free of disease left dominant Right heart cath showed no evidence of pulmonary hypertension Mean PA was 23 mean wedge of 10 Cardiac output of 7.6 Fick   ASSESSMENT &  PLAN:  *** Etiology of HF:*** NYHA class / AHA Stage:*** Volume status & Diuretics: Torsemide 40mg  BID; however, only uses it BID 2-3x weekly; reports that taking the AM dose alone is sufficient.  Vasodilators:*** Beta-Blocker:*** MRA:*** Cardiometabolic:*** Devices therapies & Valvulopathies:*** Advanced therapies:***   Nargis Abrams Advanced Heart Failure Mechanical Circulatory Support

## 2022-11-04 NOTE — Patient Instructions (Signed)
STOP Carvedilol  Routine lab work today. Will notify you of abnormal results  Your provider requests you have a cardiac mri. (They will contact you to schedule)  Your have been referred to cardiac rehab and EP. (They will contact you to schedule)  Follow up in 2 months  Do the following things EVERYDAY: Weigh yourself in the morning before breakfast. Write it down and keep it in a log. Take your medicines as prescribed Eat low salt foods--Limit salt (sodium) to 2000 mg per day.  Stay as active as you can everyday Limit all fluids for the day to less than 2 liters

## 2022-11-17 DIAGNOSIS — Z9989 Dependence on other enabling machines and devices: Secondary | ICD-10-CM | POA: Diagnosis not present

## 2022-11-17 DIAGNOSIS — G8929 Other chronic pain: Secondary | ICD-10-CM | POA: Diagnosis not present

## 2022-11-17 DIAGNOSIS — I5022 Chronic systolic (congestive) heart failure: Secondary | ICD-10-CM | POA: Diagnosis not present

## 2022-11-17 DIAGNOSIS — M5441 Lumbago with sciatica, right side: Secondary | ICD-10-CM | POA: Diagnosis not present

## 2022-11-17 DIAGNOSIS — G4733 Obstructive sleep apnea (adult) (pediatric): Secondary | ICD-10-CM | POA: Diagnosis not present

## 2022-11-17 DIAGNOSIS — I110 Hypertensive heart disease with heart failure: Secondary | ICD-10-CM | POA: Diagnosis not present

## 2022-11-17 DIAGNOSIS — K922 Gastrointestinal hemorrhage, unspecified: Secondary | ICD-10-CM | POA: Diagnosis not present

## 2022-11-17 DIAGNOSIS — M5431 Sciatica, right side: Secondary | ICD-10-CM | POA: Diagnosis not present

## 2022-11-17 DIAGNOSIS — K644 Residual hemorrhoidal skin tags: Secondary | ICD-10-CM | POA: Diagnosis not present

## 2022-11-19 DIAGNOSIS — M25561 Pain in right knee: Secondary | ICD-10-CM | POA: Diagnosis not present

## 2022-11-19 DIAGNOSIS — M5126 Other intervertebral disc displacement, lumbar region: Secondary | ICD-10-CM | POA: Diagnosis not present

## 2022-11-19 DIAGNOSIS — Z79899 Other long term (current) drug therapy: Secondary | ICD-10-CM | POA: Diagnosis not present

## 2022-11-19 DIAGNOSIS — M47816 Spondylosis without myelopathy or radiculopathy, lumbar region: Secondary | ICD-10-CM | POA: Diagnosis not present

## 2022-11-19 DIAGNOSIS — M5137 Other intervertebral disc degeneration, lumbosacral region: Secondary | ICD-10-CM | POA: Diagnosis not present

## 2022-11-19 DIAGNOSIS — I110 Hypertensive heart disease with heart failure: Secondary | ICD-10-CM | POA: Diagnosis not present

## 2022-11-19 DIAGNOSIS — M7989 Other specified soft tissue disorders: Secondary | ICD-10-CM | POA: Diagnosis not present

## 2022-11-19 DIAGNOSIS — M79651 Pain in right thigh: Secondary | ICD-10-CM | POA: Diagnosis not present

## 2022-11-19 DIAGNOSIS — M5431 Sciatica, right side: Secondary | ICD-10-CM | POA: Diagnosis not present

## 2022-11-19 DIAGNOSIS — Z880 Allergy status to penicillin: Secondary | ICD-10-CM | POA: Diagnosis not present

## 2022-11-19 DIAGNOSIS — I509 Heart failure, unspecified: Secondary | ICD-10-CM | POA: Diagnosis not present

## 2022-11-19 DIAGNOSIS — Z79891 Long term (current) use of opiate analgesic: Secondary | ICD-10-CM | POA: Diagnosis not present

## 2022-11-19 DIAGNOSIS — M79661 Pain in right lower leg: Secondary | ICD-10-CM | POA: Diagnosis not present

## 2022-11-19 DIAGNOSIS — M47819 Spondylosis without myelopathy or radiculopathy, site unspecified: Secondary | ICD-10-CM | POA: Diagnosis not present

## 2022-11-24 NOTE — Progress Notes (Unsigned)
  Electrophysiology Office Note:    Date:  11/24/2022   ID:  Marc [name], DOB 06-01-1976, MRN [id number]  [facility] Cardiologist:  None  [facility] Electrophysiologist:  Lanier Prude, MD   Referring MD: Dorthula Nettles, DO   Chief Complaint: HFrEF  History of Present Illness:    Marc [name] is a 47 y.o. male who I am seeing today for HFrEF and possible ICD at the request of Dr Gasper Lloyd. The patient has a history of NICM, OSA, tobacco use, obesity. He was diagnosed in 2015 with HF. Despite medical therapy, the EF has remained reduced.         Their past medical, social and family history was reveiwed.   ROS:   Please see the history of present illness.    All other systems reviewed and are negative.  EKGs/Labs/Other Studies Reviewed:    The following studies were reviewed today:  11/04/2022 Echo  EF 20 RV normal Mild MR  08/12/2022 ECG shows sinus, narrow QRS      Physical Exam:    VS:  There were no vitals taken for this visit.    Wt Readings from Last 3 Encounters:  11/04/22 (!) 350 lb 8 oz (159 kg)  10/07/22 (!) 349 lb (158.3 kg)  08/24/22 (!) 353 lb 4 oz (160.2 kg)     GEN: *** Well nourished, well developed in no acute distress CARDIAC: ***RRR, no murmurs, rubs, gallops RESPIRATORY:  Clear to auscultation without rales, wheezing or rhonchi       ASSESSMENT AND PLAN:    No diagnosis found.  #Chronic systolic HF #NICM EF20. NYHA III. Warm and dry. On good medical therapy. Follows with HF clinic.  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  He is referred by Dr Gasper Lloyd for risk stratification of sudden death and consideration of ICD implantation.  At this time, he meets criteria for ICD implantation for primary prevention of sudden death.  I have had a thorough discussion with the patient reviewing options.  The patient and their family (if available) have had opportunities to ask questions and  have them answered. The patient and I have decided together through a shared decision making process to proceed with ICD implant at this time.    Risks, benefits, alternatives to ICD implantation were discussed in detail with the patient today. The patient understands that the risks include but are not limited to bleeding, infection, pneumothorax, perforation, tamponade, vascular damage, renal failure, MI, stroke, death, inappropriate shocks, and lead dislodgement and wishes to proceed.  We will therefore schedule device implantation at the next available time.  Plan for Medtronic VVI ICD.         Signed, Rossie Muskrat. Lalla Brothers, MD, [facility], [facility] 11/24/2022 8:54 PM    Electrophysiology [facility]

## 2022-11-24 NOTE — H&P (View-Only) (Signed)
  Electrophysiology Office Note:    Date:  11/24/2022   ID:  Marc [name], DOB 02/27/1976, MRN [id number]  [facility] Cardiologist:  None  [facility] Electrophysiologist:  Nethan Caudillo T Oris Staffieri, MD   Referring MD: Sabharwal, Aditya, DO   Chief Complaint: HFrEF  History of Present Illness:    Marc [name] is a 47 y.o. male who I am seeing today for HFrEF and possible ICD at the request of Dr Sabharwal. The patient has a history of NICM, OSA, tobacco use, obesity. He was diagnosed in 2015 with HF. Despite medical therapy, the EF has remained reduced.         Their past medical, social and family history was reveiwed.   ROS:   Please see the history of present illness.    All other systems reviewed and are negative.  EKGs/Labs/Other Studies Reviewed:    The following studies were reviewed today:  11/04/2022 Echo  EF 20 RV normal Mild MR  08/12/2022 ECG shows sinus, narrow QRS      Physical Exam:    VS:  There were no vitals taken for this visit.    Wt Readings from Last 3 Encounters:  11/04/22 (!) 350 lb 8 oz (159 kg)  10/07/22 (!) 349 lb (158.3 kg)  08/24/22 (!) 353 lb 4 oz (160.2 kg)     GEN: *** Well nourished, well developed in no acute distress CARDIAC: ***RRR, no murmurs, rubs, gallops RESPIRATORY:  Clear to auscultation without rales, wheezing or rhonchi       ASSESSMENT AND PLAN:    No diagnosis found.  #Chronic systolic HF #NICM EF20. NYHA III. Warm and dry. On good medical therapy. Follows with HF clinic.  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  He is referred by Dr Sabharwal for risk stratification of sudden death and consideration of ICD implantation.  At this time, he meets criteria for ICD implantation for primary prevention of sudden death.  I have had a thorough discussion with the patient reviewing options.  The patient and their family (if available) have had opportunities to ask questions and  have them answered. The patient and I have decided together through a shared decision making process to proceed with ICD implant at this time.    Risks, benefits, alternatives to ICD implantation were discussed in detail with the patient today. The patient understands that the risks include but are not limited to bleeding, infection, pneumothorax, perforation, tamponade, vascular damage, renal failure, MI, stroke, death, inappropriate shocks, and lead dislodgement and wishes to proceed.  We will therefore schedule device implantation at the next available time.  Plan for Medtronic VVI ICD.         Signed, Tel Hevia T. Leib Elahi, MD, FACC, FHRS 11/24/2022 8:54 PM    Electrophysiology [facility] 

## 2022-11-25 ENCOUNTER — Ambulatory Visit: Payer: Medicare Other | Attending: Cardiology | Admitting: Cardiology

## 2022-11-25 ENCOUNTER — Encounter: Payer: Self-pay | Admitting: Cardiology

## 2022-11-25 ENCOUNTER — Other Ambulatory Visit
Admission: RE | Admit: 2022-11-25 | Discharge: 2022-11-25 | Disposition: A | Discharge status: Home / Self Care | Payer: Medicare Other | Source: Ambulatory Visit | Attending: Cardiology | Admitting: Cardiology

## 2022-11-25 VITALS — BP 116/78 | HR 110 | Ht 69.0 in | Wt 350.0 lb

## 2022-11-25 DIAGNOSIS — I5022 Chronic systolic (congestive) heart failure: Secondary | ICD-10-CM

## 2022-11-25 LAB — BASIC METABOLIC PANEL
Anion gap: 8 (ref 5–15)
BUN: 11 mg/dL (ref 6–20)
CO2: 25 mmol/L (ref 22–32)
Calcium: 9.2 mg/dL (ref 8.9–10.3)
Chloride: 104 mmol/L (ref 98–111)
Creatinine, Ser: 1.03 mg/dL (ref 0.61–1.24)
GFR, Estimated: >60 mL/min (ref >60)
Glucose, Bld: 95 mg/dL (ref 70–99)
Potassium: 4.3 mmol/L (ref 3.5–5.1)
Sodium: 137 mmol/L (ref 135–145)

## 2022-11-25 LAB — CBC WITH DIFFERENTIAL/PLATELET
Abs Immature Granulocytes: 0.05 10*3/uL (ref 0.00–0.07)
Basophils Absolute: 0.1 10*3/uL (ref 0.0–0.1)
Basophils Relative: 1 %
Eosinophils Absolute: 0.2 10*3/uL (ref 0.0–0.5)
Eosinophils Relative: 2 %
HCT: 45.7 % (ref 39.0–52.0)
Hemoglobin: 13.8 g/dL (ref 13.0–17.0)
Immature Granulocytes: 1 %
Lymphocytes Relative: 32 %
Lymphs Abs: 2.8 10*3/uL (ref 0.7–4.0)
MCH: 22.4 pg — ABNORMAL LOW (ref 26.0–34.0)
MCHC: 30.2 g/dL (ref 30.0–36.0)
MCV: 74.2 fL — ABNORMAL LOW (ref 80.0–100.0)
Monocytes Absolute: 0.7 10*3/uL (ref 0.1–1.0)
Monocytes Relative: 8 %
Neutro Abs: 5 10*3/uL (ref 1.7–7.7)
Neutrophils Relative %: 56 %
Platelets: 326 10*3/uL (ref 150–400)
RBC: 6.16 MIL/uL — ABNORMAL HIGH (ref 4.22–5.81)
RDW: 18.2 % — ABNORMAL HIGH (ref 11.5–15.5)
WBC: 8.8 10*3/uL (ref 4.0–10.5)
nRBC: 0 % (ref 0.0–0.2)

## 2022-11-25 NOTE — Patient Instructions (Signed)
Medication Instructions:  Your physician recommends that you continue on your current medications as directed. Please refer to the Current Medication list given to you today.  *If you need a refill on your cardiac medications before your next appointment, please call your pharmacy*  Lab Work: BMET and CBC - prior to procedure You will get your lab work at [facility] hospital.  Your lab work will be done at the [facility] next to [employer]. These are walk in labs- you will not need an appointment and you do not need to be fasting.    Testing/Procedures: Your physician has recommended that you have a defibrillator inserted. An implantable cardioverter defibrillator (ICD) is a small device that is placed in your chest or, in rare cases, your abdomen. This device uses electrical pulses or shocks to help control life-threatening, irregular heartbeats that could lead the heart to suddenly stop beating (sudden cardiac arrest). Leads are attached to the ICD that goes into your heart. This is done in the hospital and usually requires an overnight stay. Please see the instruction sheet given to you today for more information.  Follow-Up: At [facility], you and your health needs are our priority.  As part of our continuing mission to provide you with exceptional heart care, we have created designated Provider Care Teams.  These Care Teams include your primary Cardiologist (physician) and Advanced Practice Providers (APPs -  Physician Assistants and Nurse Practitioners) who all work together to provide you with the care you need, when you need it.  Your next appointment:   See instruction letter - we will call you to arrange follow up appointments.

## 2022-11-26 ENCOUNTER — Ambulatory Visit (INDEPENDENT_AMBULATORY_CARE_PROVIDER_SITE_OTHER): Payer: Medicare Other | Admitting: Physician Assistant

## 2022-11-26 ENCOUNTER — Ambulatory Visit: Payer: Self-pay | Admitting: *Deleted

## 2022-11-26 ENCOUNTER — Encounter: Payer: Self-pay | Admitting: Physician Assistant

## 2022-11-26 VITALS — BP 120/72 | HR 55 | Temp 98.4°F | Ht 69.0 in | Wt 343.6 lb

## 2022-11-26 DIAGNOSIS — K625 Hemorrhage of anus and rectum: Secondary | ICD-10-CM | POA: Diagnosis not present

## 2022-11-26 DIAGNOSIS — M5441 Lumbago with sciatica, right side: Secondary | ICD-10-CM | POA: Diagnosis not present

## 2022-11-26 MED ORDER — OXYCODONE HCL 5 MG PO TABS
5.0000 mg | ORAL_TABLET | Freq: Two times a day (BID) | ORAL | 0 refills | Status: AC | PRN
Start: 2022-11-26 — End: 2022-11-29

## 2022-11-26 MED ORDER — METHOCARBAMOL 750 MG PO TABS
750.0000 mg | ORAL_TABLET | Freq: Two times a day (BID) | ORAL | 0 refills | Status: DC | PRN
Start: 2022-11-26 — End: 2022-12-07

## 2022-11-26 NOTE — Progress Notes (Signed)
Acute Office Visit   Patient: Marc [name]   DOB: 19-Dec-1975   47 y.o. Male  MRN: [id number] Visit Date: 11/26/2022  Today's healthcare provider: Oswaldo ConroyErin E Chizara Mena, PA-C  Introduced myself to the patient as a Secondary school teacherA-C and provided education on APPs in clinical practice.    Chief Complaint  Patient presents with   Rectal Bleeding    Had hemorhoid busted in toilet,bleeding for 1 week,sometimes black stool, this morning bright red  and sciatic back pain   Subjective    Rectal Bleeding    HPI     Rectal Bleeding    Additional comments: Had hemorhoid busted in toilet,bleeding for 1 week,sometimes black stool, this morning bright red  and sciatic back pain      Last edited by Tilden DomeLaing, Arlene S, CMA on 11/26/2022  2:22 PM.       Rectal bleeding Onset: sudden  Duration: last week and has been intermittent since the 11/17/22  Appearance: he reports some bright red blood and clots when he wipes- this AM  He also black stools intermittently  Recent constipation: yes, he has been taking laxatives to help with this Constipation has been ongoing for about 2 weeks  He has used milk of mag but denies using Pepto recently  He reports he has a history of hemorrhoid that he has been reducing in the shower but it seemed to burst and he noticed a significant amount of blood    He reports he has been struggling with his sciatic nerve pain for the past 2 weeks  He reports he has not been able to sleep well due to pain  He reports right sided low back pain that radiates down to hip, down the back of his right leg and into foot He reports this has been ongoing for about 2 weeks but later during conversation he seemed to indicate that this is a longstanding recurrent issue he has dealt with intermittently in the past  He has been seen for this twice in the ED and was given pain medications for short duration  He reports difficulty standing for prolonged periods due to pain He states he is  very concerned about pain management and only has one remaining oxycodone left - he reports he uses this at night to reduce pain so he can sleep.     Medications: Outpatient Medications Prior to Visit  Medication Sig   albuterol (VENTOLIN HFA) 108 (90 Base) MCG/ACT inhaler Inhale 2 puffs into the lungs every 6 (six) hours as needed for wheezing or shortness of breath.   aspirin EC 81 MG tablet Take 81 mg by mouth daily.   dapagliflozin propanediol (FARXIGA) 10 MG TABS tablet Take 1 tablet (10 mg total) by mouth daily before breakfast.   docusate sodium (COLACE) 100 MG capsule Take 100 mg by mouth daily as needed for mild constipation.   methocarbamol (ROBAXIN) 500 MG tablet Take 500 mg by mouth in the morning and at bedtime.   omeprazole (PRILOSEC) 20 MG capsule Take 20 mg by mouth daily.   sacubitril-valsartan (ENTRESTO) 24-26 MG Take 1 tablet by mouth 2 (two) times daily.   spironolactone (ALDACTONE) 25 MG tablet TAKE 1 TABLET BY MOUTH DAILY   torsemide (DEMADEX) 20 MG tablet Take 40 mg by mouth daily. Extra 40 mg in PM as needed   [DISCONTINUED] oxyCODONE (OXY IR/ROXICODONE) 5 MG immediate release tablet Take 5 mg by mouth every 4 (four) hours as  needed for severe pain.   [DISCONTINUED] cyclobenzaprine (FLEXERIL) 10 MG tablet Take 10 mg by mouth 3 (three) times daily as needed for muscle spasms. (Patient not taking: Reported on 11/25/2022)   No facility-administered medications prior to visit.    Review of Systems  Gastrointestinal:  Positive for anal bleeding and hematochezia.       Objective    BP 120/72   Pulse (!) 55   Temp 98.4 F (36.9 C)   Ht 5\' 9"  (1.753 m)   Wt (!) 343 lb 9.6 oz (155.9 kg)   SpO2 98%   BMI 50.74 kg/m    Physical Exam Vitals reviewed.  Constitutional:      General: He is awake.     Appearance: Normal appearance. He is well-developed and well-groomed.  HENT:     Head: Normocephalic and atraumatic.  Eyes:     Extraocular Movements: Extraocular  movements intact.     Conjunctiva/sclera: Conjunctivae normal.  Pulmonary:     Effort: Pulmonary effort is normal.  Musculoskeletal:     Cervical back: Normal range of motion.  Skin:    General: Skin is warm.  Neurological:     General: No focal deficit present.     Mental Status: He is alert and oriented to person, place, and time. Mental status is at baseline.  Psychiatric:        Attention and Perception: Attention and perception normal.        Mood and Affect: Mood and affect normal.        Speech: Speech normal.        Behavior: Behavior normal. Behavior is cooperative.       No results found for any visits on 11/26/22.  Assessment & Plan      No follow-ups on file.       Problem List Items Addressed This Visit       Digestive   Rectal bleeding    Acute, recurrent Reports he has noticed several bouts of bright red rectal bleeding and some clots with his bowel movements for the last week. He reports having constipation that has been relieved with stool softeners He has a hx of rectal bleeding that was evaluated with colonoscopy in the past but he has missed his recommended colonoscopy last year I reviewed his labs from 11/25/22 and CBC did not show evidence of anemia Recommend he continues to use stool softeners to ease constipation and use Preparation H for hemorrhoids while awaiting GI apt We discussed his use of opioids and recommend limited use as these can contribute to constipation which will likely further irritate hemorrhoids Recommend increasing fiber intake, exercise, and staying well hydrated to further assist with hemorrhoids  Will place GI referral- he was last seen in 2022 so he should still be an active patient, recommend he calls and makes an apt soon for prompt evaluation  Recommend continued monitoring for dark stools, fatigue, nausea, weakness, vomiting, abdominal pain  Follow up as needed for persistent or progressing symptoms        Relevant  Orders   Ambulatory referral to Gastroenterology     Nervous and Auditory   Right-sided low back pain with right-sided sciatica - Primary    Unsure of chronicity, appears to be recurrent  Current concern seems to be ongoing for about 2 weeks He reports low back pain with radiation down his right leg to foot that is preventing him from sleeping well or walking normally Reviewed ED visit notes from 11/17/22  and 11/19/22  I discussed with him that pain mediations are not a long term solution and that we will need to explore more lasting therapies - I have placed a referral to Neurosurgery, Orthopedics and Physical Therapy to aid in management with goal of reducing need for opioid pain medications Will provide 3 day supply of Oxycodone 5 mg PO BID PRN (6 pills total sent in) along with Robaxin 750 mg PO BID PRN  We discussed that we do not manage long term pain here and the most we can provide is a 3 day supply; if more is required he will need to be managed by Pain management. Pain management referral was placed today  Recommend care coordination with above specialties for management  Follow up as needed for persistent or progressing symptoms        Relevant Medications   methocarbamol (ROBAXIN-750) 750 MG tablet   oxyCODONE (OXY IR/ROXICODONE) 5 MG immediate release tablet   Other Relevant Orders   Ambulatory referral to Orthopedics   Ambulatory referral to Physical Therapy   Ambulatory referral to Neurosurgery   Ambulatory referral to Pain Clinic     No follow-ups on file.   I, Kesler Wickham E Leinani Lisbon, PA-C, have reviewed all documentation for this visit. The documentation on 11/27/22 for the exam, diagnosis, procedures, and orders are all accurate and complete.   Jacquelin Hawking, MHS, PA-C [facility]

## 2022-11-26 NOTE — Assessment & Plan Note (Addendum)
Acute, recurrent Reports he has noticed several bouts of bright red rectal bleeding and some clots with his bowel movements for the last week. He reports having constipation that has been relieved with stool softeners He has a hx of rectal bleeding that was evaluated with colonoscopy in the past but he has missed his recommended colonoscopy last year I reviewed his labs from 11/25/22 and CBC did not show evidence of anemia Recommend he continues to use stool softeners to ease constipation and use Preparation H for hemorrhoids while awaiting GI apt We discussed his use of opioids and recommend limited use as these can contribute to constipation which will likely further irritate hemorrhoids Recommend increasing fiber intake, exercise, and staying well hydrated to further assist with hemorrhoids  Will place GI referral- he was last seen in 2022 so he should still be an active patient, recommend he calls and makes an apt soon for prompt evaluation  Recommend continued monitoring for dark stools, fatigue, nausea, weakness, vomiting, abdominal pain  Follow up as needed for persistent or progressing symptoms

## 2022-11-26 NOTE — Patient Instructions (Signed)
Please make sure that you are using a stool softener while you are taking the Oxycodone and Tylenol as these will make you constipated  You can use Preparation H to help with the hemorrhoids Please make an apt with GI soon to discuss a repeat Colonoscopy and management of the bleeding

## 2022-11-26 NOTE — Telephone Encounter (Signed)
  Chief Complaint: rectal bleeding , requesting medication refills Symptoms: rectal bleeding ongoing and noted this am with toilet water bright red. Reports recent hx hemorroid bursting. Has been seen in ED. Reports episode of sweating after taking shower and has rested and feels better now but reports "something just dont feel right". Reports pain in sciatic nerve and almost out of medication Frequency: this am  Pertinent Negatives: Patient denies chest pain no difficulty breathing reported no fever, denies dizziness or difficulty walking / balance issues  Disposition: [] ED /[] Urgent Care (no appt availability in office) / [x] Appointment(In office/virtual)/ []  [place] [facility]/ [] Home Care/ [] Refused Recommended Disposition /[]  Mobile Bus/ []  Follow-up with PCP Additional Notes:   Appt scheduled today . Recommended increase water intake. Recommended if sx worsen go to ED .    Reason for Disposition  MODERATE rectal bleeding (small blood clots, passing blood without stool, or toilet water turns red)  Answer Assessment - Initial Assessment Questions 1. APPEARANCE of BLOOD: "What color is it?" "Is it passed separately, on the surface of the stool, or mixed in with the stool?"      Mixed with stool, and noted in commode  2. AMOUNT: "How much blood was passed?"      Clots noted and toilet water red  3. FREQUENCY: "How many times has blood been passed with the stools?"      Usually early am and sometimes at night 4. ONSET: "When was the blood first seen in the stools?" (Days or weeks)      Has been on going and seen in ED.  5. DIARRHEA: "Is there also some diarrhea?" If Yes, ask: "How many diarrhea stools in the past 24 hours?"      na 6. CONSTIPATION: "Do you have constipation?" If Yes, ask: "How bad is it?"     Yes took laxative and able to have BM  7. RECURRENT SYMPTOMS: "Have you had blood in your stools before?" If Yes, ask: "When was the last time?" and "What happened  that time?"      Yes treated in ED and reports having hemorrhoid to burst 8. BLOOD THINNERS: "Do you take any blood thinners?" (e.g., Coumadin/warfarin, Pradaxa/dabigatran, aspirin)     ASA 81 mg . 9. OTHER SYMPTOMS: "Do you have any other symptoms?"  (e.g., abdomen pain, vomiting, dizziness, fever)     Feels tired, "doesn't feel right". Reports sweating this am after taking shower and rested and now feeling better.  10. PREGNANCY: "Is there any chance you are pregnant?" "When was your last menstrual period?"       na  Protocols used: Rectal Bleeding-A-AH

## 2022-11-27 ENCOUNTER — Telehealth: Payer: Self-pay

## 2022-11-27 DIAGNOSIS — K625 Hemorrhage of anus and rectum: Secondary | ICD-10-CM

## 2022-11-27 NOTE — Telephone Encounter (Signed)
Copied from CRM [id number]. Topic: Referral - Status >> Nov 26, 2022  4:05 PM Franchot Heidelberg wrote: Reason for CRM: Pt's wife called reporting that the patient's GI referral needs to be resubmitted marked urgent because otherwise he will not be seen until July.

## 2022-11-27 NOTE — Assessment & Plan Note (Signed)
Unsure of chronicity, appears to be recurrent  Current concern seems to be ongoing for about 2 weeks He reports low back pain with radiation down his right leg to foot that is preventing him from sleeping well or walking normally Reviewed ED visit notes from 11/17/22 and 11/19/22  I discussed with him that pain mediations are not a long term solution and that we will need to explore more lasting therapies - I have placed a referral to Neurosurgery, Orthopedics and Physical Therapy to aid in management with goal of reducing need for opioid pain medications Will provide 3 day supply of Oxycodone 5 mg PO BID PRN (6 pills total sent in) along with Robaxin 750 mg PO BID PRN  We discussed that we do not manage long term pain here and the most we can provide is a 3 day supply; if more is required he will need to be managed by Pain management. Pain management referral was placed today  Recommend care coordination with above specialties for management  Follow up as needed for persistent or progressing symptoms

## 2022-11-30 ENCOUNTER — Telehealth: Payer: Self-pay | Admitting: Gastroenterology

## 2022-11-30 NOTE — Telephone Encounter (Signed)
Can this be changed for the patient please?

## 2022-11-30 NOTE — Telephone Encounter (Signed)
Called and left a message for call back  

## 2022-11-30 NOTE — Telephone Encounter (Signed)
Patient is established with you. We received an urgent referral for rectal bleeding. Please advise where to schedule.

## 2022-11-30 NOTE — Telephone Encounter (Signed)
Marc [name]  He is past due for his surveillance colonoscopy with 2-day prep.  Schedule colonoscopy for end of May or early June after he is cleared by cardiology.  He is also scheduled to undergo ICD placement on 5/1  Reviewing his blood work, his hemoglobin is normal, with regards to rectal bleeding, recommend Anusol cream twice daily for 7 to 10 days if he continues to have rectal bleeding and recommend high-fiber diet and MiraLAX as needed to keep bowels regular  [name]

## 2022-12-01 MED ORDER — HYDROCORTISONE (PERIANAL) 2.5 % EX CREA: 1.0000 | TOPICAL_CREAM | Freq: Two times a day (BID) | CUTANEOUS | 0 refills | Status: AC

## 2022-12-01 NOTE — Telephone Encounter (Signed)
Called and left a message for call back  

## 2022-12-01 NOTE — Addendum Note (Signed)
Addended by: Radene Knee L on: 12/01/2022 10:54 AM   Modules accepted: Orders

## 2022-12-01 NOTE — Telephone Encounter (Signed)
Patient aware that referral has been updated to urgent. Also advised patient that [facility] has been trying to contact him since yesterday. Patient states he will call them back here shortly.

## 2022-12-01 NOTE — Telephone Encounter (Signed)
Referral modified to be urgent.

## 2022-12-01 NOTE — Telephone Encounter (Signed)
Patient wife which is on patient DPR form she states they will call back to schedule colonoscopy after he has the cardiology procedure and is cleared by them. He will take the Anusol cream and do a high fiber diet and miralax as needed

## 2022-12-02 ENCOUNTER — Inpatient Hospital Stay
Admission: RE | Admit: 2022-12-02 | Discharge: 2022-12-02 | Disposition: A | Discharge status: Home / Self Care | Payer: Self-pay | Source: Ambulatory Visit | Attending: Orthopedic Surgery | Admitting: Orthopedic Surgery

## 2022-12-02 ENCOUNTER — Other Ambulatory Visit: Payer: Self-pay

## 2022-12-02 DIAGNOSIS — Z049 Encounter for examination and observation for unspecified reason: Secondary | ICD-10-CM

## 2022-12-04 NOTE — Progress Notes (Unsigned)
Referring Physician:  Providence Crosby, PA-C [address] [place],  [place] [address]  Primary Physician:  [name], NP  History of Present Illness: 12/07/2022 Marc [name] has a history of CHF, HTN, CHF, cardiomyopathy, OSA with CPAP, obesity.   History of gastric ulcers and saw PCP recently for rectal bleeding.   He is scheduled for ICD on 12/16/22. Was seen in ED for his back on 11/17/22 and 11/19/22. [place] of right leg was negative for DVT on 11/19/22.   Was seen by PCP- he was given robaxin and oxycodone. Referral was done for pain management, ortho, neurosurgery, and PT.   History of chronic back issues that has been worse in last  2-3 weeks. He has constant LBP with constant right leg pain (entire leg) to his foot. He has numbness and tingling in his leg. Pain is worse at night and with prolonged sitting/standing/walking. He notes weakness in right leg as well.   He has minimal neck pain with intermittent shooting pain in left hand from wrist into his fingers- this generally happens when he picks up stuff. He has intermittent numbness and tingling in his hands. He has occasional weakness in his hands. Numbness/tingling wakes him up at night.   Bowel/Bladder Dysfunction: none  He smokes about a 1/2 ppd 20+ years.   Conservative measures:  Physical therapy: none  Multimodal medical therapy including regular antiinflammatories: robaxin, motrin, oxycodone, prednisone, flexeril, lyrica, neurontin Injections: No epidural steroid injections  Past Surgery:  no spinal surgery  Zacharee L Reigel has no symptoms of cervical myelopathy.  The symptoms are causing a significant impact on the patient's life.   Review of Systems:  A 10 point review of systems is negative, except for the pertinent positives and negatives detailed in the HPI.  Past Medical History: Past Medical History:  Diagnosis Date   CHF (congestive heart failure)    Hypertension     Past Surgical  History: Past Surgical History:  Procedure Laterality Date   BICEPS TENDON REPAIR     COLONOSCOPY WITH PROPOFOL N/A 10/09/2020   Procedure: COLONOSCOPY WITH PROPOFOL;  Surgeon: [name], MD;  Location: [facility] ENDOSCOPY;  Service: Gastroenterology;  Laterality: N/A;   FLEXIBLE SIGMOIDOSCOPY N/A 10/30/2020   Procedure: FLEXIBLE SIGMOIDOSCOPY;  Surgeon: [name], MD;  Location: [facility] ENDOSCOPY;  Service: Gastroenterology;  Laterality: N/A;   FOOT SURGERY     metataral and fasciotomy   RIGHT/LEFT HEART CATH AND CORONARY ANGIOGRAPHY Bilateral 09/19/2020   Procedure: RIGHT/LEFT HEART CATH AND CORONARY ANGIOGRAPHY;  Surgeon: Alwyn Pea, MD;  Location: [facility] INVASIVE CV LAB;  Service: Cardiovascular;  Laterality: Bilateral;   ROTATOR CUFF REPAIR      Allergies: Allergies as of 12/07/2022 - Review Complete 12/07/2022  Allergen Reaction Noted   Geodon [ziprasidone] Other (See Comments) 12/11/2019   Tramadol Other (See Comments) 10/09/2018   Amoxicillin Rash 10/09/2018   Zithromax [azithromycin] Rash 10/09/2018    Medications: Outpatient Encounter Medications as of 12/07/2022  Medication Sig   albuterol (VENTOLIN HFA) 108 (90 Base) MCG/ACT inhaler Inhale 2 puffs into the lungs every 6 (six) hours as needed for wheezing or shortness of breath.   aspirin EC 81 MG tablet Take 81 mg by mouth daily.   dapagliflozin propanediol (FARXIGA) 10 MG TABS tablet Take 1 tablet (10 mg total) by mouth daily before breakfast.   docusate sodium (COLACE) 100 MG capsule Take 100 mg by mouth daily as needed for mild constipation.   hydrocortisone (ANUSOL-HC)  2.5 % rectal cream Place 1 Application rectally 2 (two) times daily.   methocarbamol (ROBAXIN-750) 750 MG tablet Take 1 tablet (750 mg total) by mouth 2 (two) times daily as needed for muscle spasms.   omeprazole (PRILOSEC) 20 MG capsule Take 20 mg by mouth daily.   sacubitril-valsartan (ENTRESTO) 24-26 MG Take 1 tablet by mouth 2 (two)  times daily.   spironolactone (ALDACTONE) 25 MG tablet TAKE 1 TABLET BY MOUTH DAILY   torsemide (DEMADEX) 20 MG tablet Take 40 mg by mouth daily. Extra 40 mg in PM as needed   No facility-administered encounter medications on file as of 12/07/2022.    Social History: Social History   Tobacco Use   Smoking status: Every Day    Packs/day: 0.50    Years: 25.00    Additional pack years: 0.00    Total pack years: 12.50    Types: Cigarettes   Smokeless tobacco: Never  Vaping Use   Vaping Use: Never used  Substance Use Topics   Alcohol use: Not Currently   Drug use: Not Currently    Family Medical History: Family History  Problem Relation Age of Onset   Stroke Mother    Dementia Mother    Hypertension Father    Diabetes Father    Hypertension Paternal Grandfather    Diabetes Paternal Grandfather     Physical Examination: Vitals:   12/07/22 1407  BP: 127/77  Pulse: (!) 56  SpO2: 97%    General: Patient is well developed, well nourished, calm, collected, and in no apparent distress. Attention to examination is appropriate.  Respiratory: Patient is breathing without any difficulty.   NEUROLOGICAL:     Awake, alert, oriented to person, place, and time.  Speech is clear and fluent. Fund of knowledge is appropriate.   Cranial Nerves: Pupils equal round and reactive to light.  Facial tone is symmetric.    He has mild right sided lumbar tenderness.   No abnormal lesions on exposed skin.   Strength: Side Biceps Triceps Deltoid Interossei Grip Wrist Ext. Wrist Flex.  R L Side Iliopsoas Quads Hamstring PF DF EHL  R L No gross weakness noted, but he has pain with strength testing of right leg.   Reflexes are 2+ and symmetric at the biceps, triceps, brachioradialis, patella and achilles.   Hoffman's is absent.  Clonus is not present.   Bilateral upper and lower extremity sensation is intact to light touch.      He has positive tinels at right wrist, negative on left.  Negative tinels at bilateral elbows.   Positive phalens right wrist, negative at left wrist.   Gait is slow and he has slight limp.   Medical Decision Making  Imaging: MRI of lumbar spine dated 11/19/22:  FINDINGS: Bone marrow signal intensity is normal. The visualized cord is unremarkable and the conus medullaris ends at a normal level.  The vertebral bodies are normally aligned. There is diffuse congenital narrowing of the spinal canal most pronounced L3-S1.  T12-L1: No herniation. Thickened ligamentum flavum and bilateral facet arthropathy with mild spinal canal narrowing and moderate left neural foraminal narrowing.  L1-L2: No herniation. Thickened ligamentum flavum with minimal spinal canal narrowing. No neural foraminal narrowing.  L2-L3: No herniation. Thickened ligamentum flavum with mild spinal canal narrowing. No neural foraminal narrowing.  L3-L4: No herniation. Thickened ligamentum flavum with mild spinal canal narrowing. No neural foraminal narrowing  L4-L5: Central focal disc extrusion . Symmetric disc bulge, thickened ligamentum flavum and bilateral facet joint hypertrophy with severe narrowing of the spinal canal and the bilateral neural foramina.  L5-S1: diffuse disc bulge with superimposed. Left extrusion, thickened ligamentum flavum and bilateral facet joint hypertrophy with severe narrowing of the spinal canal and the bilateral neural foramina.  The paraspinal tissues are within normal limits.  For the purposes of this dictation, the lowest well formed intervertebral disc space is assumed to be the L5-S1 level, and there are presumed to be five lumbar-type vertebral bodies.  IMPRESSION: Multilevel degenerative changes with severe spinal canal and neural foraminal narrowing most pronounced at the L4-L5 and L5-S1 levels.  Degenerative findings are superimposed upon diffuse congenitally narrow spinal  canal Exam End: 11/19/22 09:56   Specimen Collected: 11/19/22 10:04 Last Resulted: 11/19/22 10:33  Received From: [facility]  Result Received: 11/19/22 14:16    I have personally reviewed the images and agree with the above interpretation.  Assessment and Plan: Mr. Marengo is a pleasant 47 y.o. male has history of chronic back issues that has been worse in last  2-3 weeks. He has constant LBP with constant right leg pain (entire leg) to his foot. He has not been able to sleep due to pain.   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   Treatment options discussed with patient and following plan made:   - Would avoid NSAIDs due to history of GI bleed and recent rectal bleeding.  - Can consider medrol dose pack. He is scheduled for ICD with cardiology on 12/16/22. Message to Dr. Lalla Brothers to make sure dose pack is okay.  - Robaxin is not helping. He was on flexeril previously with better relief. He will stop robaxin. Refill given on flexeril prn muscle spasms. Reviewed dosing and side effects. Discussed this can cause drowsiness.  - No relief with lyrica or neurontin in the past.  - Referral to pain management ([facility]) to consider lumbar injections. Message to his staff to see if he get in as soon as possible.  - Numbness/tingling/pain in hands may be due to carpal tunnel syndrome. Can revisit at next visit. May need referral to ortho for this.  - Weight loss discussed and recommended. Goal weight prior to surgery would be around 270 pounds. He is working on this.  - Smoking cessation discussed and encouraged. He is work on this as well.   Pulse noted to be 56. Looks like he's run low in the past. He feels good and has no symptoms of SOB, dizziness, or lightheadedness. Follow up with PCP if any of these occur.   I spent a total of 45 minutes in face-to-face and non-face-to-face activities related to this patient's care  today including review of outside records, review of imaging, review of symptoms, physical exam, discussion of differential diagnosis, discussion of treatment options, and documentation.   Thank you for involving me in the care of this patient.   Drake Leach PA-C Dept. of Neurosurgery

## 2022-12-07 ENCOUNTER — Ambulatory Visit: Payer: Medicare Other | Admitting: Orthopedic Surgery

## 2022-12-07 ENCOUNTER — Encounter: Payer: Self-pay | Admitting: Orthopedic Surgery

## 2022-12-07 VITALS — BP 127/77 | HR 56 | Ht 69.0 in | Wt 348.4 lb

## 2022-12-07 DIAGNOSIS — M48061 Spinal stenosis, lumbar region without neurogenic claudication: Secondary | ICD-10-CM

## 2022-12-07 DIAGNOSIS — M47816 Spondylosis without myelopathy or radiculopathy, lumbar region: Secondary | ICD-10-CM

## 2022-12-07 DIAGNOSIS — M4726 Other spondylosis with radiculopathy, lumbar region: Secondary | ICD-10-CM

## 2022-12-07 DIAGNOSIS — M5416 Radiculopathy, lumbar region: Secondary | ICD-10-CM

## 2022-12-07 MED ORDER — CYCLOBENZAPRINE HCL 10 MG PO TABS
10.0000 mg | ORAL_TABLET | Freq: Three times a day (TID) | ORAL | 0 refills | Status: DC | PRN
Start: 2022-12-07 — End: 2023-02-03

## 2022-12-07 NOTE — Patient Instructions (Signed)
It was so nice to see you today. Thank you so much for coming in.    You have some wear and tear in your back (arthritis). You have spinal stenosis with disc bulging at L4-L5 and L5-S1. I think this is where you pain is coming from.   I will message cardiology to be sure you can have a steroid dose pack prior to your procedure. I will let you know either way.   Stop the methocarbamol. I sent a refill of cyclobenzaprine to help with muscle spasms. Use only as needed and be careful, this can make you sleepy.   I want you to see pain management here in [place] (Dr. Cherylann Ratel) to discuss possible lumbar injections. They should call you to schedule an appointment or you can call them at [phone].   We may start PT after your cardiac procedure.   Work on weight loss. Weight would need to be closer to 270 pounds prior to considering surgery for your back.   Work on quitting smoking. This can make your back pain worse.   I will see you back in 6 weeks. Please do not hesitate to call if you have any questions or concerns. You can also message me in MyChart.   Drake Leach PA-C [phone]

## 2022-12-08 ENCOUNTER — Telehealth: Payer: Self-pay

## 2022-12-08 NOTE — Telephone Encounter (Signed)
Spoke with the patient and advised to hold Farxiga for 3 days prior to his procedure with Dr. Lalla Brothers scheduled for 5/1. Patient verbalized understanding.

## 2022-12-16 ENCOUNTER — Inpatient Hospital Stay
Admission: AD | Admit: 2022-12-16 | Discharge: 2022-12-18 | DRG: 276 | Disposition: A | Discharge status: Home / Self Care | Payer: Medicare Other | Attending: Internal Medicine | Admitting: Internal Medicine

## 2022-12-16 ENCOUNTER — Other Ambulatory Visit: Payer: Self-pay

## 2022-12-16 ENCOUNTER — Ambulatory Visit: Payer: Medicare Other

## 2022-12-16 ENCOUNTER — Encounter: Payer: Self-pay | Admitting: Cardiology

## 2022-12-16 ENCOUNTER — Encounter
Admission: AD | Disposition: A | Discharge status: Home / Self Care | Payer: Self-pay | Source: Home / Self Care | Attending: Internal Medicine

## 2022-12-16 DIAGNOSIS — Z888 Allergy status to other drugs, medicaments and biological substances status: Secondary | ICD-10-CM | POA: Diagnosis not present

## 2022-12-16 DIAGNOSIS — I5022 Chronic systolic (congestive) heart failure: Secondary | ICD-10-CM | POA: Diagnosis not present

## 2022-12-16 DIAGNOSIS — I2510 Atherosclerotic heart disease of native coronary artery without angina pectoris: Secondary | ICD-10-CM | POA: Diagnosis present

## 2022-12-16 DIAGNOSIS — I5020 Unspecified systolic (congestive) heart failure: Secondary | ICD-10-CM | POA: Diagnosis not present

## 2022-12-16 DIAGNOSIS — Z713 Dietary counseling and surveillance: Secondary | ICD-10-CM

## 2022-12-16 DIAGNOSIS — Z8249 Family history of ischemic heart disease and other diseases of the circulatory system: Secondary | ICD-10-CM

## 2022-12-16 DIAGNOSIS — Z833 Family history of diabetes mellitus: Secondary | ICD-10-CM

## 2022-12-16 DIAGNOSIS — F17210 Nicotine dependence, cigarettes, uncomplicated: Secondary | ICD-10-CM | POA: Diagnosis present

## 2022-12-16 DIAGNOSIS — G4733 Obstructive sleep apnea (adult) (pediatric): Secondary | ICD-10-CM

## 2022-12-16 DIAGNOSIS — I10 Essential (primary) hypertension: Secondary | ICD-10-CM | POA: Diagnosis not present

## 2022-12-16 DIAGNOSIS — Z7982 Long term (current) use of aspirin: Secondary | ICD-10-CM

## 2022-12-16 DIAGNOSIS — R000 Tachycardia, unspecified: Secondary | ICD-10-CM | POA: Diagnosis present

## 2022-12-16 DIAGNOSIS — I493 Ventricular premature depolarization: Secondary | ICD-10-CM | POA: Diagnosis not present

## 2022-12-16 DIAGNOSIS — I428 Other cardiomyopathies: Secondary | ICD-10-CM

## 2022-12-16 DIAGNOSIS — Z95810 Presence of automatic (implantable) cardiac defibrillator: Secondary | ICD-10-CM | POA: Diagnosis present

## 2022-12-16 DIAGNOSIS — R008 Other abnormalities of heart beat: Secondary | ICD-10-CM | POA: Diagnosis not present

## 2022-12-16 DIAGNOSIS — M109 Gout, unspecified: Secondary | ICD-10-CM | POA: Diagnosis not present

## 2022-12-16 DIAGNOSIS — Z885 Allergy status to narcotic agent status: Secondary | ICD-10-CM | POA: Diagnosis not present

## 2022-12-16 DIAGNOSIS — Z720 Tobacco use: Secondary | ICD-10-CM | POA: Diagnosis present

## 2022-12-16 DIAGNOSIS — Z880 Allergy status to penicillin: Secondary | ICD-10-CM | POA: Diagnosis not present

## 2022-12-16 DIAGNOSIS — I5023 Acute on chronic systolic (congestive) heart failure: Secondary | ICD-10-CM | POA: Diagnosis not present

## 2022-12-16 DIAGNOSIS — I255 Ischemic cardiomyopathy: Secondary | ICD-10-CM | POA: Diagnosis present

## 2022-12-16 DIAGNOSIS — I340 Nonrheumatic mitral (valve) insufficiency: Secondary | ICD-10-CM | POA: Diagnosis not present

## 2022-12-16 DIAGNOSIS — Z79899 Other long term (current) drug therapy: Secondary | ICD-10-CM

## 2022-12-16 DIAGNOSIS — I110 Hypertensive heart disease with heart failure: Secondary | ICD-10-CM | POA: Diagnosis not present

## 2022-12-16 DIAGNOSIS — Z823 Family history of stroke: Secondary | ICD-10-CM | POA: Diagnosis not present

## 2022-12-16 DIAGNOSIS — Z6841 Body Mass Index (BMI) 40.0 and over, adult: Secondary | ICD-10-CM

## 2022-12-16 DIAGNOSIS — M5431 Sciatica, right side: Secondary | ICD-10-CM | POA: Diagnosis not present

## 2022-12-16 DIAGNOSIS — I509 Heart failure, unspecified: Secondary | ICD-10-CM

## 2022-12-16 DIAGNOSIS — J811 Chronic pulmonary edema: Secondary | ICD-10-CM | POA: Diagnosis not present

## 2022-12-16 HISTORY — PX: ICD IMPLANT: EP1208

## 2022-12-16 LAB — CBC
HCT: 43.5 % (ref 39.0–52.0)
Hemoglobin: 13.4 g/dL (ref 13.0–17.0)
MCH: 22.4 pg — ABNORMAL LOW (ref 26.0–34.0)
MCHC: 30.8 g/dL (ref 30.0–36.0)
MCV: 72.7 fL — ABNORMAL LOW (ref 80.0–100.0)
Platelets: 304 10*3/uL (ref 150–400)
RBC: 5.98 MIL/uL — ABNORMAL HIGH (ref 4.22–5.81)
RDW: 17.4 % — ABNORMAL HIGH (ref 11.5–15.5)
WBC: 8.8 10*3/uL (ref 4.0–10.5)
nRBC: 0 % (ref 0.0–0.2)

## 2022-12-16 LAB — BASIC METABOLIC PANEL
Anion gap: 11 (ref 5–15)
BUN: 19 mg/dL (ref 6–20)
CO2: 28 mmol/L (ref 22–32)
Calcium: 9.1 mg/dL (ref 8.9–10.3)
Chloride: 97 mmol/L — ABNORMAL LOW (ref 98–111)
Creatinine, Ser: 1.05 mg/dL (ref 0.61–1.24)
GFR, Estimated: >60 mL/min (ref >60)
Glucose, Bld: 112 mg/dL — ABNORMAL HIGH (ref 70–99)
Potassium: 3.6 mmol/L (ref 3.5–5.1)
Sodium: 136 mmol/L (ref 135–145)

## 2022-12-16 LAB — BRAIN NATRIURETIC PEPTIDE: B Natriuretic Peptide: 117.4 pg/mL — ABNORMAL HIGH (ref 0.0–100.0)

## 2022-12-16 SURGERY — ICD IMPLANT
Anesthesia: Moderate Sedation

## 2022-12-16 MED ORDER — HYDRALAZINE HCL 20 MG/ML IJ SOLN: 5.0000 mg | INTRAMUSCULAR | Status: DC | PRN

## 2022-12-16 MED ORDER — MIDAZOLAM HCL 2 MG/2ML IJ SOLN
INTRAMUSCULAR | Status: AC
  Filled 2022-12-16: qty 2

## 2022-12-16 MED ORDER — DM-GUAIFENESIN ER 30-600 MG PO TB12
1.0000 | ORAL_TABLET | Freq: Two times a day (BID) | ORAL | Status: DC | PRN
  Filled 2022-12-16: qty 1

## 2022-12-16 MED ORDER — POVIDONE-IODINE 10 % EX SWAB: 2.0000 | Freq: Once | CUTANEOUS | Status: DC

## 2022-12-16 MED ORDER — POTASSIUM CHLORIDE CRYS ER 20 MEQ PO TBCR: 40.0000 meq | EXTENDED_RELEASE_TABLET | Freq: Once | ORAL | Status: DC

## 2022-12-16 MED ORDER — OXYCODONE-ACETAMINOPHEN 5-325 MG PO TABS
ORAL_TABLET | ORAL | Status: AC
  Filled 2022-12-16: qty 1

## 2022-12-16 MED ORDER — FUROSEMIDE 10 MG/ML IJ SOLN
INTRAMUSCULAR | Status: AC
  Filled 2022-12-16: qty 8

## 2022-12-16 MED ORDER — ACETAMINOPHEN 325 MG PO TABS
ORAL_TABLET | ORAL | Status: AC
  Filled 2022-12-16: qty 2

## 2022-12-16 MED ORDER — AMIODARONE LOAD VIA INFUSION
150.0000 mg | Freq: Once | INTRAVENOUS | Status: AC
  Administered 2022-12-16: 150 mg via INTRAVENOUS
  Filled 2022-12-16: qty 83.34

## 2022-12-16 MED ORDER — ALBUTEROL SULFATE (2.5 MG/3ML) 0.083% IN NEBU
3.0000 mL | INHALATION_SOLUTION | RESPIRATORY_TRACT | Status: DC | PRN
  Administered 2022-12-16: 3 mL via RESPIRATORY_TRACT
  Filled 2022-12-16: qty 3

## 2022-12-16 MED ORDER — ASPIRIN 81 MG PO TBEC
81.0000 mg | DELAYED_RELEASE_TABLET | Freq: Every day | ORAL | Status: DC
  Administered 2022-12-16 – 2022-12-18 (×3): 81 mg via ORAL
  Filled 2022-12-16 (×3): qty 1

## 2022-12-16 MED ORDER — LIDOCAINE HCL 1 % IJ SOLN
INTRAMUSCULAR | Status: AC
  Filled 2022-12-16: qty 20

## 2022-12-16 MED ORDER — VANCOMYCIN HCL IN DEXTROSE 1-5 GM/200ML-% IV SOLN
1000.0000 mg | INTRAVENOUS | Status: AC
  Administered 2022-12-16: 1000 mg via INTRAVENOUS

## 2022-12-16 MED ORDER — POTASSIUM CHLORIDE CRYS ER 20 MEQ PO TBCR
40.0000 meq | EXTENDED_RELEASE_TABLET | Freq: Once | ORAL | Status: AC
  Administered 2022-12-16: 40 meq via ORAL

## 2022-12-16 MED ORDER — FUROSEMIDE 10 MG/ML IJ SOLN
40.0000 mg | Freq: Two times a day (BID) | INTRAMUSCULAR | Status: DC
  Administered 2022-12-17 – 2022-12-18 (×3): 40 mg via INTRAVENOUS
  Filled 2022-12-16 (×3): qty 4

## 2022-12-16 MED ORDER — FUROSEMIDE 10 MG/ML IJ SOLN
80.0000 mg | Freq: Once | INTRAMUSCULAR | Status: AC
  Administered 2022-12-16: 80 mg via INTRAVENOUS

## 2022-12-16 MED ORDER — MIDAZOLAM HCL 2 MG/2ML IJ SOLN
INTRAMUSCULAR | Status: DC | PRN
  Administered 2022-12-16 (×2): 1 mg via INTRAVENOUS

## 2022-12-16 MED ORDER — SODIUM CHLORIDE 0.9 % IV SOLN
80.0000 mg | INTRAVENOUS | Status: AC
  Administered 2022-12-16: 80 mg
  Filled 2022-12-16: qty 2

## 2022-12-16 MED ORDER — POTASSIUM CHLORIDE CRYS ER 20 MEQ PO TBCR
EXTENDED_RELEASE_TABLET | ORAL | Status: AC
  Filled 2022-12-16: qty 2

## 2022-12-16 MED ORDER — LORAZEPAM 0.5 MG PO TABS: 0.5000 mg | ORAL_TABLET | Freq: Three times a day (TID) | ORAL | Status: DC | PRN

## 2022-12-16 MED ORDER — AMIODARONE HCL IN DEXTROSE 360-4.14 MG/200ML-% IV SOLN
60.0000 mg/h | INTRAVENOUS | Status: AC
  Administered 2022-12-16 – 2022-12-17 (×2): 60 mg/h via INTRAVENOUS
  Filled 2022-12-16 (×2): qty 200

## 2022-12-16 MED ORDER — NICOTINE 21 MG/24HR TD PT24
21.0000 mg | MEDICATED_PATCH | Freq: Every day | TRANSDERMAL | Status: DC
  Administered 2022-12-17 – 2022-12-18 (×2): 21 mg via TRANSDERMAL
  Filled 2022-12-16 (×2): qty 1

## 2022-12-16 MED ORDER — PANTOPRAZOLE SODIUM 40 MG PO TBEC
40.0000 mg | DELAYED_RELEASE_TABLET | Freq: Every day | ORAL | Status: DC
  Administered 2022-12-16 – 2022-12-18 (×3): 40 mg via ORAL
  Filled 2022-12-16 (×3): qty 1

## 2022-12-16 MED ORDER — FENTANYL CITRATE (PF) 100 MCG/2ML IJ SOLN
INTRAMUSCULAR | Status: AC
  Filled 2022-12-16: qty 2

## 2022-12-16 MED ORDER — HEPARIN (PORCINE) IN NACL 1000-0.9 UT/500ML-% IV SOLN
INTRAVENOUS | Status: AC
  Filled 2022-12-16: qty 1000

## 2022-12-16 MED ORDER — SODIUM CHLORIDE 0.9 % IV SOLN: INTRAVENOUS | Status: DC

## 2022-12-16 MED ORDER — ACETAMINOPHEN 325 MG PO TABS
325.0000 mg | ORAL_TABLET | ORAL | Status: DC | PRN
  Administered 2022-12-16: 650 mg via ORAL

## 2022-12-16 MED ORDER — CHLORHEXIDINE GLUCONATE CLOTH 2 % EX PADS
6.0000 | MEDICATED_PAD | Freq: Every day | CUTANEOUS | Status: DC
  Administered 2022-12-16 – 2022-12-18 (×3): 6 via TOPICAL

## 2022-12-16 MED ORDER — OXYCODONE-ACETAMINOPHEN 5-325 MG PO TABS
1.0000 | ORAL_TABLET | ORAL | Status: DC | PRN
  Administered 2022-12-16 – 2022-12-17 (×5): 1 via ORAL
  Filled 2022-12-16 (×4): qty 1

## 2022-12-16 MED ORDER — AMIODARONE HCL IN DEXTROSE 360-4.14 MG/200ML-% IV SOLN
30.0000 mg/h | INTRAVENOUS | Status: DC
  Administered 2022-12-17 – 2022-12-18 (×4): 30 mg/h via INTRAVENOUS
  Filled 2022-12-16 (×3): qty 200

## 2022-12-16 MED ORDER — ONDANSETRON HCL 4 MG/2ML IJ SOLN
4.0000 mg | Freq: Four times a day (QID) | INTRAMUSCULAR | Status: DC | PRN
  Administered 2022-12-17: 4 mg via INTRAVENOUS
  Filled 2022-12-16: qty 2

## 2022-12-16 MED ORDER — CYCLOBENZAPRINE HCL 10 MG PO TABS
10.0000 mg | ORAL_TABLET | Freq: Three times a day (TID) | ORAL | Status: DC | PRN
  Administered 2022-12-17: 10 mg via ORAL
  Filled 2022-12-16: qty 1

## 2022-12-16 MED ORDER — ACETAMINOPHEN 325 MG PO TABS
650.0000 mg | ORAL_TABLET | Freq: Four times a day (QID) | ORAL | Status: DC | PRN
  Administered 2022-12-17 – 2022-12-18 (×3): 650 mg via ORAL
  Filled 2022-12-16 (×3): qty 2

## 2022-12-16 MED ORDER — VANCOMYCIN HCL IN DEXTROSE 1-5 GM/200ML-% IV SOLN
INTRAVENOUS | Status: AC
  Filled 2022-12-16: qty 200

## 2022-12-16 MED ORDER — LIDOCAINE HCL 1 % IJ SOLN
INTRAMUSCULAR | Status: AC
  Filled 2022-12-16: qty 40

## 2022-12-16 MED ORDER — FENTANYL CITRATE (PF) 100 MCG/2ML IJ SOLN
INTRAMUSCULAR | Status: DC | PRN
  Administered 2022-12-16 (×3): 25 ug via INTRAVENOUS

## 2022-12-16 MED ORDER — HEPARIN (PORCINE) IN NACL 1000-0.9 UT/500ML-% IV SOLN
INTRAVENOUS | Status: DC | PRN
  Administered 2022-12-16: 500 mL

## 2022-12-16 MED ORDER — LIDOCAINE HCL (PF) 1 % IJ SOLN
INTRAMUSCULAR | Status: DC | PRN
  Administered 2022-12-16: 85 mL

## 2022-12-16 MED ORDER — IOHEXOL 300 MG/ML  SOLN
INTRAMUSCULAR | Status: DC | PRN
  Administered 2022-12-16: 15 mL

## 2022-12-16 SURGICAL SUPPLY — 13 items
CABLE SURG 12 DISP A/V CHANNEL (MISCELLANEOUS) IMPLANT
DEVICE DSSCT PLSMBLD 3.0S LGHT (MISCELLANEOUS) IMPLANT
ICD COBALT XT VR DVPA2D4 (ICD Generator) IMPLANT
LEAD SPRINT QUAT SEC 6935M-62 (Lead) IMPLANT
PAD ELECT DEFIB RADIOL ZOLL (MISCELLANEOUS) IMPLANT
PLASMABLADE 3.0S W/LIGHT (MISCELLANEOUS) ×1
SHEATH 9FR PRELUDE SNAP 13 (SHEATH) IMPLANT
SLING ARM IMMOBILIZER XL (CAST SUPPLIES) IMPLANT
SUT DVC VLOC 3-0 CL 6 P-12 (SUTURE) IMPLANT
SUT ETHIBOND CT1 BRD #0 30IN (SUTURE) IMPLANT
SUTURE VLOC 90 2-0 VIO GS21 (SUTURE) IMPLANT
TRAY PACEMAKER INSERTION (PACKS) ×1 IMPLANT
WIRE HITORQ VERSACORE ST 145CM (WIRE) IMPLANT

## 2022-12-16 NOTE — OR Nursing (Signed)
Dr Lalla Brothers in to discuss medical status with wife and patient. Pt reporting anxiety and sudden startle when he dozes off in chair. Oxygen saturations 98% on room air. Reviewed chest Xray and K 3.6. Verbal order for 40 meq Kdur before getting 1600 Lasix.

## 2022-12-16 NOTE — Consult Note (Signed)
EP/Cardiology Consultation   Patient ID: Marc [name] MRN: [id number]; DOB: May 28, 1976  Admit date: 12/16/2022 Date of Consult: 12/16/2022  PCP:  [name], NP   [facility] Providers Cardiologist:  None  Electrophysiologist:  Lanier Prude, MD    History of Present Illness:   Marc [name] is being admitted to the hospital after ICD implant this morning.  He has a history of chronic systolic heart failure with an ejection fraction of around 20%.  He is morbidly obese and also has a history of hypertension.  This morning's ICD implant went well but the patient presented to the hospital today complaining of progressively worsening shortness of breath and steady weight gain despite use of torsemide.  Upon arrival to the hospital today prior to ICD implant it was also noted that he was in bigeminy.  He has a longstanding sinus tachycardia but the PVCs were new.  He also describes PND like episodes occurring at home, worse over the last few days.  He denies missing medications.  No recent illnesses.  His wife is with him today.  Past medical, surgical, family, social histories reviewed.         ROS:  Please see the history of present illness.   All other ROS reviewed and negative.     Physical Exam/Data:   Vitals:   12/16/22 1401 12/16/22 1500 12/16/22 1601 12/16/22 1701  BP: 101/77 (!) 89/75 (!) 113/45 (!) 145/81  Pulse: (!) 108 (!) 114 (!) 58 (!) 59  Resp: (!) 28 (!) 22 (!) 32 (!) 24  Temp:    97.7 F (36.5 C)  TempSrc:    Axillary  SpO2: 93% 97% 95% 95%  Weight:      Height:        Intake/Output Summary (Last 24 hours) at 12/16/2022 2001 Last data filed at 12/16/2022 1921 Gross per 24 hour  Intake 1164.5 ml  Output 1075 ml  Net 89.5 ml      12/16/2022    7:01 AM 12/07/2022    2:07 PM 11/26/2022    2:17 PM  Last 3 Weights  Weight (lbs) 348 lb 348 lb 6.4 oz 343 lb 9.6 oz  Weight (kg) 157.852 kg 158.033 kg 155.856 kg     Body mass index  is 51.39 kg/m.    General: Morbidly obese.  Mild shortness of breath after the ICD implant. Cardiac: Tachycardic.  Warm extremities.  1+ pitting bilateral lower extremity edema to the knees.  His abdomen is tense. Lungs: Poor aeration to bilateral lung bases.  Mildly increased work of breathing. Psych:  Normal affect    Telemetry:  Telemetry was personally reviewed and demonstrates: Sinus rhythm with bigeminal PVCs.  Relevant CV Studies:  November 04, 2022 echo EF 20 to 25% Moderate to severely dilated left ventricle.  RV normal.  Mild MR.   Chest x-ray after ICD implant shows significant pulmonary edema.  Assessment and Plan:   #Chronic systolic heart failure NYHA class III.  Nonischemic based on heart catheterization from 2022.  He is established with Dr. Gasper Lloyd in the heart failure clinic.  EF 20 to 25%.  Significant LV dilation.  By history has PND and edema despite diuretic use.  I do think his cardiomyopathy has progressed.  He presented for his ICD implant today with signs and symptoms of volume overload.  After the ICD implant, I elected to admit him to the hospital for heart failure optimization.  He will require IV diuresis.  Recommend  twice daily dosing for now with careful monitoring of electrolytes (keep potassium greater than 4 and magnesium greater than 2).  I am also going to suppress his ventricular ectopy given it may be contributing to his LV dysfunction.  Start amiodarone by IV.  I will get a CMP, TSH and free T4 tomorrow morning to establish a new baseline for long-term monitoring of his amiodarone use.  He will require outpatient PYP scan to evaluate for possible amyloidosis diagnosis given history of tendon injury and cardiomyopathy.   In summary: -Start amiodarone IV -Lasix IV twice daily with careful monitoring of ins and outs -Keep K greater than 4 and Mag greater than 2 -Continue Entresto and spironolactone.    #ICD in situ Single-chamber ICD implant  Dec 16, 2022. Device functioning well on interrogation after implant.  Chest x-ray shows stable lead position.  Avoid anticoagulation.  Keep bandage intact.  Our team will remove.  Maintain sling of the left upper extremity for the next 24 to 48 hours.  Keep completely dry.    Cardiology to follow.   For questions or updates, please contact [facility] Please consult [email or web address] for contact info under    Signed, Lanier Prude, MD  12/16/2022 8:01 PM

## 2022-12-16 NOTE — Plan of Care (Signed)
  Problem: Education: Goal: Knowledge of cardiac device and self-care will improve Outcome: Progressing Goal: Ability to safely manage health related needs after discharge will improve Outcome: Progressing Goal: Individualized Educational Video(s) Outcome: Progressing   Problem: Cardiac: Goal: Ability to achieve and maintain adequate cardiopulmonary perfusion will improve Outcome: Progressing   Problem: Education: Goal: Knowledge of General Education information will improve Description: Including pain rating scale, medication(s)/side effects and non-pharmacologic comfort measures Outcome: Progressing   Problem: Health Behavior/Discharge Planning: Goal: Ability to manage health-related needs will improve Outcome: Progressing   Problem: Clinical Measurements: Goal: Ability to maintain clinical measurements within normal limits will improve Outcome: Progressing Goal: Will remain free from infection Outcome: Progressing Goal: Diagnostic test results will improve Outcome: Progressing Goal: Respiratory complications will improve Outcome: Progressing Goal: Cardiovascular complication will be avoided Outcome: Progressing   Problem: Activity: Goal: Risk for activity intolerance will decrease Outcome: Progressing   Problem: Nutrition: Goal: Adequate nutrition will be maintained Outcome: Progressing   Problem: Coping: Goal: Level of anxiety will decrease Outcome: Progressing   Problem: Elimination: Goal: Will not experience complications related to bowel motility Outcome: Progressing Goal: Will not experience complications related to urinary retention Outcome: Progressing   Problem: Pain Managment: Goal: General experience of comfort will improve Outcome: Progressing   Problem: Safety: Goal: Ability to remain free from injury will improve Outcome: Progressing   Problem: Skin Integrity: Goal: Risk for impaired skin integrity will decrease Outcome: Progressing    Problem: Education: Goal: Ability to demonstrate management of disease process will improve Outcome: Progressing Goal: Ability to verbalize understanding of medication therapies will improve Outcome: Progressing Goal: Individualized Educational Video(s) Outcome: Progressing   Problem: Activity: Goal: Capacity to carry out activities will improve Outcome: Progressing   Problem: Cardiac: Goal: Ability to achieve and maintain adequate cardiopulmonary perfusion will improve Outcome: Progressing

## 2022-12-16 NOTE — H&P (Signed)
History and Physical    Marc [name] ZOX:[id number] DOB: [birth year] DOA: 12/16/2022  Referring MD/NP/PA:   PCP: Marc Grooms, NP   [name] coming from:  Marc [name] is coming from home.    Chief Complaint: SOB  HPI: Marc [name] is a 47 y.o. male with medical history significant of sCHF with EF 20-25%, HTN, gout, morbid obesity, OSA on CPAP, tobacco abuse, who presents with shortness of breath.  Pt has hx of sCHF with EF 20-25%. Pt is scheduled for ICD placement today by Dr. Lalla Brothers of cardiology.  [name] underwent successful procedure for ICD placement.  He reports dry cough, shortness of breath, chest congestion.  No fever.  Denies nausea,vomiting, diarrhea or abdominal pain.  No symptoms of UTI.  He reports more than 5 pounds of weight gain recently.  Chest x-ray showed vascular congestion, consistent with CHF exacerbation.  Data reviewed independently and ED Course: pt was found to have WBC 8.8, GFR> 60, BNP 117. Temperature 97.8, blood pressure 77/63 which improved to 108/53, heart rate 56, 116, RR 22, 11, oxygen saturation 90-98% on room air.  [name] is admitted to PCU as inpatient.   EKG:  Not done in ED, will get one.      Review of Systems:   General: no fevers, chills, no body weight gain, has fatigue HEENT: no blurry vision, hearing changes or sore throat Respiratory: has dyspnea, coughing, no wheezing CV: no chest pain, no palpitations GI: no nausea, vomiting, abdominal pain, diarrhea, constipation GU: no dysuria, burning on urination, increased urinary frequency, hematuria  Ext: has leg edema Neuro: no unilateral weakness, numbness, or tingling, no vision change or hearing loss Skin: no rash, no skin tear. MSK: No muscle spasm, no deformity, no limitation of range of movement in spin Heme: No easy bruising.  Travel history: No recent long distant travel.   Allergy:  Allergies  Allergen Reactions   Geodon [Ziprasidone] Other (See Comments)     paralysis   Tramadol Other (See Comments)    Pt stated that it gave him sores.   Amoxicillin Rash   Zithromax [Azithromycin] Rash    Past Medical History:  Diagnosis Date   CHF (congestive heart failure) (HCC)    Hypertension     Past Surgical History:  Procedure Laterality Date   BICEPS TENDON REPAIR     COLONOSCOPY WITH PROPOFOL N/A 10/09/2020   Procedure: COLONOSCOPY WITH PROPOFOL;  Surgeon: [name], MD;  Location: [facility] ENDOSCOPY;  Service: Gastroenterology;  Laterality: N/A;   FLEXIBLE SIGMOIDOSCOPY N/A 10/30/2020   Procedure: FLEXIBLE SIGMOIDOSCOPY;  Surgeon: [name], MD;  Location: [facility] ENDOSCOPY;  Service: Gastroenterology;  Laterality: N/A;   FOOT SURGERY     metataral and fasciotomy   RIGHT/LEFT HEART CATH AND CORONARY ANGIOGRAPHY Bilateral 09/19/2020   Procedure: RIGHT/LEFT HEART CATH AND CORONARY ANGIOGRAPHY;  Surgeon: Alwyn Pea, MD;  Location: [facility] INVASIVE CV LAB;  Service: Cardiovascular;  Laterality: Bilateral;   ROTATOR CUFF REPAIR      Social History:  reports that he has been smoking cigarettes. He has a 12.50 pack-year smoking history. He has never used smokeless tobacco. He reports that he does not currently use alcohol. He reports that he does not currently use drugs.  Family History:  Family History  Problem Relation Age of Onset   Stroke Mother    Dementia Mother    Hypertension Father    Diabetes Father    Hypertension Paternal Grandfather    Diabetes Paternal Grandfather  Prior to Admission medications   Medication Sig Start Date End Date Taking? Authorizing Provider  albuterol (VENTOLIN HFA) 108 (90 Base) MCG/ACT inhaler Inhale 2 puffs into Marc lungs every 6 (six) hours as needed for wheezing or shortness of breath. 08/06/22  Yes Burnadette Pop, MD  aspirin EC 81 MG tablet Take 81 mg by mouth daily.   Yes [provider]  cyclobenzaprine (FLEXERIL) 10 MG tablet Take 1 tablet (10 mg total) by mouth 3  (three) times daily as needed for muscle spasms. This can make you sleepy. 12/07/22  Yes Drake Leach, PA-C  docusate sodium (COLACE) 100 MG capsule Take 100 mg by mouth daily as needed for mild constipation.   Yes [provider]  hydrocortisone (ANUSOL-HC) 2.5 % rectal cream Place 1 Application rectally 2 (two) times daily. 12/01/22  Yes Vanga, Loel Dubonnet, MD  omeprazole (PRILOSEC) 20 MG capsule Take 20 mg by mouth daily. 11/17/22 02/15/23 Yes [provider]  sacubitril-valsartan (ENTRESTO) 24-26 MG Take 1 tablet by mouth 2 (two) times daily. 08/24/22  Yes Clarisa Kindred A, FNP  spironolactone (ALDACTONE) 25 MG tablet TAKE 1 TABLET BY MOUTH DAILY 10/26/22  Yes Clarisa Kindred A, FNP  torsemide (DEMADEX) 20 MG tablet Take 40 mg by mouth daily. Extra 40 mg in PM as needed   Yes [provider]  dapagliflozin propanediol (FARXIGA) 10 MG TABS tablet Take 1 tablet (10 mg total) by mouth daily before breakfast. 08/24/22   Delma Freeze, FNP    Physical Exam: Vitals:   12/16/22 1401 12/16/22 1500 12/16/22 1601 12/16/22 1701  BP: 101/77 (!) 89/75 (!) 113/45 (!) 145/81  Pulse: (!) 108 (!) 114 (!) 58 (!) 59  Resp: (!) 28 (!) 22 (!) 32 (!) 24  Temp:    97.7 F (36.5 C)  TempSrc:    Axillary  SpO2: 93% 97% 95% 95%  Weight:      Height:       General: Not in acute distress. Pt is anxious HEENT:       Eyes: PERRL, EOMI, no scleral icterus.       ENT: No discharge from Marc ears and nose, no pharynx injection, no tonsillar enlargement.        Neck: Difficult to assess JVD due to morbid obesity, no bruit, no mass felt. Heme: No neck lymph node enlargement. Cardiac: S1/S2, RRR, No murmurs, No gallops or rubs. Respiratory: Fine crackles bilaterally  GI: Soft, nondistended, nontender, no rebound pain, no organomegaly, BS present. GU: No hematuria Ext: 1+ pitting leg edema bilaterally. 1+DP/PT pulse bilaterally. Musculoskeletal: No joint deformities, No joint redness or warmth, no  limitation of ROM in spin. Skin: No rashes.  Neuro: Alert, oriented X3, cranial nerves II-XII grossly intact, moves all extremities normally.  Psych: [name] is not psychotic, no suicidal or hemocidal ideation.  Labs on Admission: I have personally reviewed following labs and imaging studies  CBC: Recent Labs  Lab 12/16/22 1447  WBC 8.8  HGB 13.4  HCT 43.5  MCV 72.7*  PLT 304   Basic Metabolic Panel: Recent Labs  Lab 12/16/22 1447  NA 136  K 3.6  CL 97*  CO2 28  GLUCOSE 112*  BUN 19  CREATININE 1.05  CALCIUM 9.1   GFR: Estimated Creatinine Clearance: 128.5 mL/min (by C-G formula based on SCr of 1.05 mg/dL). Liver Function Tests: No results for input(s): "AST", "ALT", "ALKPHOS", "BILITOT", "PROT", "ALBUMIN" in Marc last 168 hours. No results for input(s): "LIPASE", "AMYLASE" in Marc last  168 hours. No results for input(s): "AMMONIA" in Marc last 168 hours. Coagulation Profile: No results for input(s): "INR", "PROTIME" in Marc last 168 hours. Cardiac Enzymes: No results for input(s): "CKTOTAL", "CKMB", "CKMBINDEX", "TROPONINI" in Marc last 168 hours. BNP (last 3 results) No results for input(s): "PROBNP" in Marc last 8760 hours. HbA1C: No results for input(s): "HGBA1C" in Marc last 72 hours. CBG: No results for input(s): "GLUCAP" in Marc last 168 hours. Lipid Profile: No results for input(s): "CHOL", "HDL", "LDLCALC", "TRIG", "CHOLHDL", "LDLDIRECT" in Marc last 72 hours. Thyroid Function Tests: No results for input(s): "TSH", "T4TOTAL", "FREET4", "T3FREE", "THYROIDAB" in Marc last 72 hours. Anemia Panel: No results for input(s): "VITAMINB12", "FOLATE", "FERRITIN", "TIBC", "IRON", "RETICCTPCT" in Marc last 72 hours. Urine analysis:    Component Value Date/Time   APPEARANCEUR Clear 04/24/2021 1117   GLUCOSEU 3+ (A) 04/24/2021 1117   BILIRUBINUR Negative 04/24/2021 1117   PROTEINUR 1+ (A) 04/24/2021 1117   NITRITE Negative 04/24/2021 1117   LEUKOCYTESUR Negative 04/24/2021  1117   Sepsis Labs: @LABRCNTIP (procalcitonin:4,lacticidven:4) )No results found for this or any previous visit (from Marc past 240 hour(s)).   Radiological Exams on Admission: No results found.    Assessment/Plan Principal Problem:   Acute on chronic systolic CHF (congestive heart failure) (HCC) Active Problems:   ICD (implantable cardioverter-defibrillator) in place   Hypertension   OSA on CPAP   Tobacco user   Morbid obesity (HCC)   Assessment and Plan:  Acute on chronic systolic CHF (congestive heart failure) (HCC): 2D echo on 11/04/2022 showed EF of 20-25%.  [name] has leg edema, shortness of breath, vascular congestion on chest x-ray, crackles on auscultation, clinically consistent with CHF exacerbation.  BNP is 117 which is likely falsely low due to morbid obesity.  -Will admit to PCU as inpatient -Lasix 40 mg bid by IV tomorrow AM ( pt has received 2 doses of 80 mg of Lasix at 10:30 and 16:00 today) -Daily weights -strict I/O's -Low salt diet -Fluid restriction -As needed bronchodilators for shortness of breath -Hold spironolactone and Entresto due to soft blood pressure  ICD (implantable cardioverter-defibrillator) in place: newly placed today, no acute complications.  [name] complains of pain in Marc surgical site. -Observe closely -As needed Tylenol and Percocet for pain  Hypertension -IV hydralazine as needed -[name] is on IV Lasix -Hold spironolactone and Entresto due to soft blood pressure  OSA - on CPAP  Tobacco user -Did counseling about importance of quitting smoking -Nicotine patch  Morbid obesity (HCC): Body weight 157.9 kg, BMI 51.39 -Encourage losing weight -Healthy diet and exercise    DVT ppx: SCD  Code Status: Full code  Family Communication: I offered to call his family, but Marc [name] states that his wife was with him earlier, who knows what is going on for him.  He said I do need to call his family.  Disposition Plan:   Anticipate discharge back to previous environment  Consults called: Dr. Lalla Brothers of cardiology  Admission status and Level of care: Progressive:    as inpt       Dispo: Marc [name] is from: Home              Anticipated d/c is to: Home              Anticipated d/c date is: 2 days              [name] currently is not medically stable to d/c.    Severity of Illness:  Marc  appropriate [name] status for this [name] is INPATIENT. Inpatient status is judged to be reasonable and necessary in order to provide Marc required intensity of service to ensure Marc [name]'s safety. Marc [name]'s presenting symptoms, physical exam findings, and initial radiographic and laboratory data in Marc context of their chronic comorbidities is felt to place them at high risk for further clinical deterioration. Furthermore, it is not anticipated that Marc [name] will be medically stable for discharge from Marc hospital within 2 midnights of admission.   * I certify that at Marc point of admission it is my clinical judgment that Marc [name] will require inpatient hospital care spanning beyond 2 midnights from Marc point of admission due to high intensity of service, high risk for further deterioration and high frequency of surveillance required.*       Date of Service 12/16/2022    Lorretta Harp [facility]   If 7PM-7AM, please contact night-coverage [email or web address] 12/16/2022, 5:51 PM

## 2022-12-16 NOTE — Interval H&P Note (Signed)
History and Physical Interval Note:  12/16/2022 7:11 AM  Marc [name]  has presented today for surgery, with the diagnosis of ICD Implant   Medtronic  Dual   Heart failure.  The various methods of treatment have been discussed with the patient and family. After consideration of risks, benefits and other options for treatment, the patient has consented to  Procedure(s): ICD IMPLANT (N/A) as a surgical intervention.  The patient's history has been reviewed, patient examined, no change in status, stable for surgery.  I have reviewed the patient's chart and labs.  Questions were answered to the patient's satisfaction.     Jernee Murtaugh T Deontez Klinke

## 2022-12-17 ENCOUNTER — Other Ambulatory Visit (HOSPITAL_COMMUNITY): Payer: Self-pay | Admitting: *Deleted

## 2022-12-17 ENCOUNTER — Encounter: Payer: Self-pay | Admitting: Cardiology

## 2022-12-17 ENCOUNTER — Encounter (HOSPITAL_COMMUNITY): Payer: Self-pay

## 2022-12-17 DIAGNOSIS — I5022 Chronic systolic (congestive) heart failure: Secondary | ICD-10-CM

## 2022-12-17 LAB — COMPREHENSIVE METABOLIC PANEL
ALT: 13 U/L (ref 0–44)
AST: 22 U/L (ref 15–41)
Albumin: 3.7 g/dL (ref 3.5–5.0)
Alkaline Phosphatase: 87 U/L (ref 38–126)
Anion gap: 12 (ref 5–15)
BUN: 25 mg/dL — ABNORMAL HIGH (ref 6–20)
CO2: 23 mmol/L (ref 22–32)
Calcium: 9.1 mg/dL (ref 8.9–10.3)
Chloride: 100 mmol/L (ref 98–111)
Creatinine, Ser: 1.21 mg/dL (ref 0.61–1.24)
GFR, Estimated: >60 mL/min (ref >60)
Glucose, Bld: 134 mg/dL — ABNORMAL HIGH (ref 70–99)
Potassium: 3.8 mmol/L (ref 3.5–5.1)
Sodium: 135 mmol/L (ref 135–145)
Total Bilirubin: 0.4 mg/dL (ref 0.3–1.2)
Total Protein: 8.3 g/dL — ABNORMAL HIGH (ref 6.5–8.1)

## 2022-12-17 LAB — CBC
HCT: 42.9 % (ref 39.0–52.0)
Hemoglobin: 13.2 g/dL (ref 13.0–17.0)
MCH: 22.5 pg — ABNORMAL LOW (ref 26.0–34.0)
MCHC: 30.8 g/dL (ref 30.0–36.0)
MCV: 73.1 fL — ABNORMAL LOW (ref 80.0–100.0)
Platelets: 287 10*3/uL (ref 150–400)
RBC: 5.87 MIL/uL — ABNORMAL HIGH (ref 4.22–5.81)
RDW: 17.6 % — ABNORMAL HIGH (ref 11.5–15.5)
WBC: 8.1 10*3/uL (ref 4.0–10.5)
nRBC: 0 % (ref 0.0–0.2)

## 2022-12-17 LAB — MAGNESIUM: Magnesium: 2.1 mg/dL (ref 1.7–2.4)

## 2022-12-17 LAB — T4, FREE: Free T4: 0.96 ng/dL (ref 0.61–1.12)

## 2022-12-17 LAB — TSH: TSH: 3.583 u[IU]/mL (ref 0.350–4.500)

## 2022-12-17 MED ORDER — POTASSIUM CHLORIDE CRYS ER 20 MEQ PO TBCR
40.0000 meq | EXTENDED_RELEASE_TABLET | Freq: Once | ORAL | Status: AC
  Administered 2022-12-17: 40 meq via ORAL
  Filled 2022-12-17: qty 2

## 2022-12-17 MED ORDER — OXYCODONE-ACETAMINOPHEN 5-325 MG PO TABS: 1.0000 | ORAL_TABLET | Freq: Three times a day (TID) | ORAL | Status: DC | PRN

## 2022-12-17 MED ORDER — OXYCODONE-ACETAMINOPHEN 5-325 MG PO TABS
1.0000 | ORAL_TABLET | Freq: Three times a day (TID) | ORAL | Status: DC | PRN
  Administered 2022-12-17: 1 via ORAL
  Filled 2022-12-17: qty 1

## 2022-12-17 MED ORDER — ORAL CARE MOUTH RINSE: 15.0000 mL | OROMUCOSAL | Status: DC | PRN

## 2022-12-17 MED ORDER — ENOXAPARIN SODIUM 80 MG/0.8ML IJ SOSY
0.5000 mg/kg | PREFILLED_SYRINGE | INTRAMUSCULAR | Status: DC
  Administered 2022-12-17: 80 mg via SUBCUTANEOUS
  Filled 2022-12-17: qty 0.8

## 2022-12-17 NOTE — Care Management Important Message (Signed)
Important Message  Patient Details  Name: Marc [name] MRN: [id number] Date of Birth: [birth year]   Medicare Important Message Given:  N/A - LOS <3 / Initial given by admissions     Johnell Comings 12/17/2022, 7:06 PM

## 2022-12-17 NOTE — Consult Note (Addendum)
   Heart Failure Nurse Navigator Note  HFrEF 20 to 25%.  The left ventricular internal cavity is moderately to severely dilated.  Right ventricular systolic function is normal.  Mild mitral regurgitation.  Patient complained of worsening shortness of breath and steady weight gain, episodes of PND,after ICD implant was admitted to the hospital for heart failure.  BNP 117, BMI 51.6.   Comorbidities:  Obesity Hypertension Obstructive sleep apnea with CPAP Tobacco abuse   Medications:  Amiodarone infusion Aspirin 81 mg daily Furosemide 40 mg IV every 12 NicoDerm patch 21 mg daily   Labs:  Sodium 135, potassium 3.8, chloride 100, CO2 23, BUN 25, creatinine 1.21, estimated GFR greater than 60 Weight 158.5 kg Intake 1652 mL Output 1225 mL  Initial meeting with patient he is lying in bed in no acute distress.  No family members present at the bedside.  Discussed how he takes care of himself at home.  Patient admitted to getting away from weighing daily.  Reminded the importance of daily weight and reporting 2 pound weight gain overnight or total of 5 pounds in the week.  Also discussed to report changes in symptoms such as increasing shortness of breath, PND, orthopnea, or lower extremity edema.  He voices understanding.  Went over his fluid restriction, patient is unsure as to how much he is drinking daily he drinks 1 cup of coffee in the a.m. and is unsure of what his cup measures  discussed using a measuring to see how much the cup holds. he also admits to drinking Gatorade and 2 cans of soda along with bottled water.  Re  enforced 64 ounce fluid restriction.  States that he drinks to Gatorade due to cramping. Make sure to be reading labels for sodium content.  I asked that he discuss that with his physician see if there is something else that he could be drinking beside the Gatorade  Also discussed his diet.  He admitted to eating potato chips and pretzel and using salt.  Eating subs  made with packaged luncheon meats and american cheese. Made aware that he should limit his sodium intake to no more than 2000 mg a day.  Went over things to use such as Mrs. Dash and other things for seasoning but not to be using salt as most foods contain sodium.  Discussed if can a ford meats from the deli and using swiss and provolone cheese as in lower in sodium then [place] and cheddar cheese.  He makes soups from scratch making his own broth not using packaged broth. Again stressed the importance of reading labels and staying under 2000 mg a day.  He has no further questions.  Will continue to follow.  He has follow up scheduled May 20 at 11 AM.  Tresa Endo RN [facility]

## 2022-12-17 NOTE — Discharge Instructions (Signed)
After Your ICD (Implantable Cardiac Defibrillator)   You have a Medtronic ICD  ACTIVITY Do not lift your arm above shoulder height for 1 week after your procedure. After 7 days, you may progress as below.  You should remove your sling 24 hours after your procedure, unless otherwise instructed by your provider.     Thursday Dec 24, 2022  Friday Dec 25, 2022 Saturday Dec 26, 2022 Sunday Dec 27, 2022   Do not lift, push, pull, or carry anything over 10 pounds with the affected arm until 6 weeks (Thursday January 28, 2023 ) after your procedure.   You may drive AFTER your wound check, unless you have been told otherwise by your provider.   Ask your healthcare provider when you can go back to work   INCISION/Dressing If you are on a blood thinner such as Coumadin, Xarelto, Eliquis, Plavix, or Pradaxa please confirm with your provider when this should be resumed.   If large square, outer bandage is left in place, this can be removed after 24 hours from your procedure. Do not remove steri-strips or glue as below.   Monitor your defibrillator site for redness, swelling, and drainage. Call the device clinic at [phone] if you experience these symptoms or fever/chills.  If your incision is sealed with Steri-strips or staples, you may shower 7 days after your procedure or when told by your provider. Do not remove the steri-strips or let the shower hit directly on your site. You may wash around your site with soap and water.    If you were discharged in a sling, please do not wear this during the day more than 48 hours after your surgery unless otherwise instructed. This may increase the risk of stiffness and soreness in your shoulder.   Avoid lotions, ointments, or perfumes over your incision until it is well-healed.  You may use a hot tub or a pool AFTER your wound check appointment if the incision is completely closed.  Your ICD is designed to protect you from life threatening heart  rhythms. Because of this, you may receive a shock.   1 shock with no symptoms:  Call the office during business hours. 1 shock with symptoms (chest pain, chest pressure, dizziness, lightheadedness, shortness of breath, overall feeling unwell):  Call 911. If you experience 2 or more shocks in 24 hours:  Call 911. If you receive a shock, you should not drive for 6 months per the  [place] DMV IF you receive appropriate therapy from your ICD.   ICD Alerts:  Some alerts are vibratory and others beep. These are NOT emergencies. Please call our office to let us know. If this occurs at night or on weekends, it can wait until the next business day. Send a remote transmission.  If your device is capable of reading fluid status (for heart failure), you will be offered monthly monitoring to review this with you.   DEVICE MANAGEMENT Remote monitoring is used to monitor your ICD from home. This monitoring is scheduled every 91 days by our office. It allows [place] to keep an eye on the functioning of your device to ensure it is working properly. You will routinely see your Electrophysiologist annually (more often if necessary).   You should receive your ID card for your new device in 4-8 weeks. Keep this card with you at all times once received. Consider wearing a medical alert bracelet or necklace.  Your ICD  may be MRI compatible. This will be discussed at your next  office visit/wound check.  You should avoid contact with strong electric or magnetic fields.   Do not use amateur (ham) radio equipment or electric (arc) welding torches. MP3 player headphones with magnets should not be used. Some devices are safe to use if held at least 12 inches (30 cm) from your defibrillator. These include power tools, lawn mowers, and speakers. If you are unsure if something is safe to use, ask your health care provider.  When using your cell phone, hold it to the ear that is on the opposite side from the defibrillator. Do not leave  your cell phone in a pocket over the defibrillator.  You may safely use electric blankets, heating pads, computers, and microwave ovens.  Call the office right away if: You have chest pain. You feel more than one shock. You feel more short of breath than you have felt before. You feel more light-headed than you have felt before. Your incision starts to open up.  This information is not intended to replace advice given to you by your health care provider. Make sure you discuss any questions you have with your health care provider.

## 2022-12-17 NOTE — Progress Notes (Signed)
PROGRESS NOTE    Marc [name]  WUJ:[id number] DOB: 05-Jul-1976 DOA: 12/16/2022 PCP: [name], NP    Assessment & Plan:   Principal Problem:   Acute on chronic systolic CHF (congestive heart failure) (HCC) Active Problems:   ICD (implantable cardioverter-defibrillator) in place   Hypertension   OSA on CPAP   Tobacco user   Morbid obesity (HCC)  Assessment and Plan: Acute on chronic systolic CHF: echo on 11/04/2022 showed EF of 20-25%.  Patient has leg edema, shortness of breath, vascular congestion on chest x-ray, crackles on auscultation, clinically consistent with CHF exacerbation.  BNP is 117. Continue on IV lasix & IV amio as per cardio. Monitor I/Os and daily weights. Fluid restriction. Pt is not monitoring fluid & salt intake at home. Received CHF education. Cardio following and recs apprec    S/p ICD placement: placed on 12/16/22, no acute complications.  Patient complains of pain in the surgical site. Percocet prn    HTN: continue on lasix. Holding aldactone, entresto due to soft BP    OSA: CPAP qhs    Tobacco use: nicotine patch to prevent w/drawal. Received smoking cessation counseling already   Morbid obesity: BMI 51.6. Complicates overall care & prognosis       DVT prophylaxis: lovenox  Code Status: full  Family Communication:  Disposition Plan: likely d/c back home   Level of care: Progressive Status is: Inpatient Remains inpatient appropriate because: severity of illness    Consultants:  Cardio   Procedures:   Antimicrobials:    Subjective: Pt c/o shortness of breath.   Objective: Vitals:   12/16/22 2318 12/17/22 0419 12/17/22 0502 12/17/22 0748  BP:  97/64  (!) 140/87  Pulse:  (!) 57  (!) 53  Resp:  20  18  Temp:  98 F (36.7 C)  98.1 F (36.7 C)  TempSrc:      SpO2: 93% 95%  (!) 87%  Weight:   (!) 158.5 kg   Height:        Intake/Output Summary (Last 24 hours) at 12/17/2022 0824 Last data filed at 12/17/2022 0700 Gross per  24 hour  Intake 1652.73 ml  Output 1225 ml  Net 427.73 ml   Filed Weights   12/16/22 0701 12/17/22 0502  Weight: (!) 157.9 kg (!) 158.5 kg    Examination:  General exam: Appears calm and comfortable  Respiratory system: diminished breath sounds b/l  Cardiovascular system: S1 & S2 +. No rubs, gallops or clicks Gastrointestinal system: Abdomen is obese, soft and nontender.  Normal bowel sounds heard. Central nervous system: Alert and oriented. Moves all extremities  Psychiatry: Judgement and insight appear normal. Mood & affect appropriate.     Data Reviewed: I have personally reviewed following labs and imaging studies  CBC: Recent Labs  Lab 12/16/22 1447 12/17/22 0551  WBC 8.8 8.1  HGB 13.4 13.2  HCT 43.5 42.9  MCV 72.7* 73.1*  PLT 304 287   Basic Metabolic Panel: Recent Labs  Lab 12/16/22 1447 12/17/22 0551  NA 136 135  K 3.6 3.8  CL 97* 100  CO2 28 23  GLUCOSE 112* 134*  BUN 19 25*  CREATININE 1.05 1.21  CALCIUM 9.1 9.1  MG  --  2.1   GFR: Estimated Creatinine Clearance: 111.7 mL/min (by C-G formula based on SCr of 1.21 mg/dL). Liver Function Tests: Recent Labs  Lab 12/17/22 0551  AST 22  ALT 13  ALKPHOS 87  BILITOT 0.4  PROT 8.3*  ALBUMIN 3.7  No results for input(s): "LIPASE", "AMYLASE" in the last 168 hours. No results for input(s): "AMMONIA" in the last 168 hours. Coagulation Profile: No results for input(s): "INR", "PROTIME" in the last 168 hours. Cardiac Enzymes: No results for input(s): "CKTOTAL", "CKMB", "CKMBINDEX", "TROPONINI" in the last 168 hours. BNP (last 3 results) No results for input(s): "PROBNP" in the last 8760 hours. HbA1C: No results for input(s): "HGBA1C" in the last 72 hours. CBG: No results for input(s): "GLUCAP" in the last 168 hours. Lipid Profile: No results for input(s): "CHOL", "HDL", "LDLCALC", "TRIG", "CHOLHDL", "LDLDIRECT" in the last 72 hours. Thyroid Function Tests: Recent Labs    12/17/22 0551  TSH  3.583  FREET4 0.96   Anemia Panel: No results for input(s): "VITAMINB12", "FOLATE", "FERRITIN", "TIBC", "IRON", "RETICCTPCT" in the last 72 hours. Sepsis Labs: No results for input(s): "PROCALCITON", "LATICACIDVEN" in the last 168 hours.  No results found for this or any previous visit (from the past 240 hour(s)).       Radiology Studies: EP PPM/ICD IMPLANT  Result Date: 12/16/2022  CONCLUSIONS:  1.  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 Successful ICD implantation with a Medtronic VVI ICD implanted for primary prevention of sudden death.  4.  No early apparent complications.        Scheduled Meds:  aspirin EC  81 mg Oral Daily   Chlorhexidine Gluconate Cloth  6 each Topical Q0600   furosemide  40 mg Intravenous Q12H   nicotine  21 mg Transdermal Daily   pantoprazole  40 mg Oral Daily   Continuous Infusions:  amiodarone 30 mg/hr (12/17/22 0700)     LOS: 1 day    Time spent: 35 mins     Charise Killian, MD [facility] Pager [phone]  If 7PM-7AM, please contact night-coverage [email or web address] 12/17/2022, 8:24 AM

## 2022-12-17 NOTE — Progress Notes (Signed)
Patient examined this AM  Pt states he feels better this AM than he did yesterday.  He has filled the bedside urinal about 3 times Amiodarone gtt continues   Cont to have diminished breath sounds in bilat bases Scant dependent edema  Tele continues to show frequent PVCs, bigeminal at times. Mostly trigeminal-- every 4 beats, rates around 100.    L chest incision with old, scant bleeding on outer bandage, area marked. Outer bandage to remain in place until discharge.  L shoulder sling removed  Reviewed L arm activity and movement restrictions. Placed in AVS.  EP Follow-up appts scheduled.    Recommend cont amio gtt for now. At discharge, 400mg  BID x 1 week, then 400mg  daily until HF follow-up, currently scheduled for 5/20  Will plan for FDG-PET scan for sarcoid eval as outpatient   Sherie Don, NP 12/17/22 9:09 AM

## 2022-12-18 LAB — CBC
HCT: 41.1 % (ref 39.0–52.0)
Hemoglobin: 13.3 g/dL (ref 13.0–17.0)
MCH: 23.3 pg — ABNORMAL LOW (ref 26.0–34.0)
MCHC: 32.4 g/dL (ref 30.0–36.0)
MCV: 71.9 fL — ABNORMAL LOW (ref 80.0–100.0)
Platelets: 213 10*3/uL (ref 150–400)
RBC: 5.72 MIL/uL (ref 4.22–5.81)
RDW: 17.3 % — ABNORMAL HIGH (ref 11.5–15.5)
WBC: 10.8 10*3/uL — ABNORMAL HIGH (ref 4.0–10.5)
nRBC: 0.2 % (ref 0.0–0.2)

## 2022-12-18 LAB — BASIC METABOLIC PANEL
Anion gap: 10 (ref 5–15)
BUN: 28 mg/dL — ABNORMAL HIGH (ref 6–20)
CO2: 26 mmol/L (ref 22–32)
Calcium: 9.1 mg/dL (ref 8.9–10.3)
Chloride: 99 mmol/L (ref 98–111)
Creatinine, Ser: 1.12 mg/dL (ref 0.61–1.24)
GFR, Estimated: >60 mL/min (ref >60)
Glucose, Bld: 128 mg/dL — ABNORMAL HIGH (ref 70–99)
Potassium: 4 mmol/L (ref 3.5–5.1)
Sodium: 135 mmol/L (ref 135–145)

## 2022-12-18 LAB — IRON AND TIBC
Iron: 33 ug/dL — ABNORMAL LOW (ref 45–182)
Saturation Ratios: 8 % — ABNORMAL LOW (ref 17.9–39.5)
TIBC: 409 ug/dL (ref 250–450)
UIBC: 376 ug/dL

## 2022-12-18 LAB — FERRITIN: Ferritin: 36 ng/mL (ref 24–336)

## 2022-12-18 MED ORDER — OXYCODONE-ACETAMINOPHEN 5-325 MG PO TABS
1.0000 | ORAL_TABLET | Freq: Four times a day (QID) | ORAL | Status: DC | PRN
  Administered 2022-12-18: 1 via ORAL
  Filled 2022-12-18: qty 1

## 2022-12-18 MED ORDER — AMIODARONE HCL 400 MG PO TABS: ORAL_TABLET | ORAL | 0 refills | Status: DC

## 2022-12-18 MED ORDER — OXYCODONE-ACETAMINOPHEN 5-325 MG PO TABS: 1.0000 | ORAL_TABLET | ORAL | Status: DC | PRN

## 2022-12-18 MED ORDER — TORSEMIDE 20 MG PO TABS
40.0000 mg | ORAL_TABLET | Freq: Every day | ORAL | Status: DC
  Administered 2022-12-18: 40 mg via ORAL
  Filled 2022-12-18: qty 2

## 2022-12-18 MED ORDER — TORSEMIDE 20 MG PO TABS: 40.0000 mg | ORAL_TABLET | Freq: Every day | ORAL | Status: DC

## 2022-12-18 MED ORDER — SPIRONOLACTONE 25 MG PO TABS
25.0000 mg | ORAL_TABLET | Freq: Every day | ORAL | Status: DC
  Administered 2022-12-18: 25 mg via ORAL
  Filled 2022-12-18: qty 1

## 2022-12-18 MED ORDER — SACUBITRIL-VALSARTAN 24-26 MG PO TABS
1.0000 | ORAL_TABLET | Freq: Two times a day (BID) | ORAL | Status: DC
  Administered 2022-12-18: 1 via ORAL
  Filled 2022-12-18: qty 1

## 2022-12-18 MED ORDER — PHENOL 1.4 % MT LIQD
1.0000 | OROMUCOSAL | Status: DC | PRN
  Administered 2022-12-18: 1 via OROMUCOSAL
  Filled 2022-12-18: qty 177

## 2022-12-18 MED ORDER — TORSEMIDE 20 MG PO TABS: 40.0000 mg | ORAL_TABLET | Freq: Every day | ORAL | Status: DC | PRN

## 2022-12-18 MED ORDER — POTASSIUM CHLORIDE CRYS ER 10 MEQ PO TBCR
10.0000 meq | EXTENDED_RELEASE_TABLET | Freq: Once | ORAL | Status: AC
  Administered 2022-12-18: 10 meq via ORAL
  Filled 2022-12-18: qty 1

## 2022-12-18 NOTE — Discharge Summary (Signed)
Physician Discharge Summary  Marc [name]:[id number] DOB: Oct 29, 1975 DOA: 12/16/2022  PCP: [name], NP  Admit date: 12/16/2022 Discharge date: 12/18/2022  Admitted From: home Disposition:  home   Recommendations for Outpatient Follow-up:  Follow up with PCP in 1-2 weeks F/u w/ heart failure clinic on 01/04/23  Home Health: no  Equipment/Devices:  Discharge Condition: stable  CODE STATUS: full  Diet recommendation: Heart Healthy   Brief/Interim Summary: HPI was taken from Dr. Clyde Lundborg: Marc [name] is a 47 y.o. male with medical history significant of sCHF with EF 20-25%, HTN, gout, morbid obesity, OSA on CPAP, tobacco abuse, who presents with shortness of breath.   Pt has hx of sCHF with EF 20-25%. Pt is scheduled for ICD placement today by Dr. Lalla Brothers of cardiology.  Patient underwent successful procedure for ICD placement.  He reports dry cough, shortness of breath, chest congestion.  No fever.  Denies nausea,vomiting, diarrhea or abdominal pain.  No symptoms of UTI.  He reports more than 5 pounds of weight gain recently.  Chest x-ray showed vascular congestion, consistent with CHF exacerbation.   Data reviewed independently and ED Course: pt was found to have WBC 8.8, GFR> 60, BNP 117. Temperature 97.8, blood pressure 77/63 which improved to 108/53, heart rate 56, 116, RR 22, 11, oxygen saturation 90-98% on room air.  Patient is admitted to PCU as inpatient.      Discharge Diagnoses:  Principal Problem:   Acute on chronic systolic CHF (congestive heart failure) (HCC) Active Problems:   ICD (implantable cardioverter-defibrillator) in place   Hypertension   OSA on CPAP   Tobacco user   Morbid obesity (HCC)  Acute on chronic systolic CHF: echo on 11/04/2022 showed EF of 20-25%.  Patient has leg edema, shortness of breath, vascular congestion on chest x-ray, crackles on auscultation, clinically consistent with CHF exacerbation.  BNP is 117. Weaned off IV amiodarone  drip and started on po amio as per cardio. Monitor I/Os and daily weights. Fluid restriction. Pt is not monitoring fluid & salt intake at home. Received CHF education. Cardio following and recs apprec. Please see cardio notes for more information   S/p ICD placement: placed on 12/16/22, no acute complications.  Patient complains of pain in the surgical site. Percocet prn    HTN: continue on lasix. Holding aldactone, entresto due to soft BP    OSA: CPAP qhs    Tobacco use: nicotine patch to prevent w/drawal. Received smoking cessation counseling already   Morbid obesity: BMI 51.6. Complicates overall care & prognosis   Discharge Instructions  Discharge Instructions     Diet - low sodium heart healthy   Complete by: As directed    Discharge instructions   Complete by: As directed    F/u w/ PCP in 1-2 weeks. F/u w/ heart failure clinic on 01/04/23.   Increase activity slowly   Complete by: As directed       Allergies as of 12/18/2022       Reactions   Geodon [ziprasidone] Other (See Comments)   paralysis   Tramadol Other (See Comments)   Pt stated that it gave him sores.   Amoxicillin Rash   Zithromax [azithromycin] Rash        Medication List     TAKE these medications    albuterol 108 (90 Base) MCG/ACT inhaler Commonly known as: VENTOLIN HFA Inhale 2 puffs into the lungs every 6 (six) hours as needed for wheezing or shortness of breath.   amiodarone  400 MG tablet Commonly known as: Pacerone 400mg  BID x 1 week and then 400mg  daily thereafter   aspirin EC 81 MG tablet Take 81 mg by mouth daily.   cyclobenzaprine 10 MG tablet Commonly known as: FLEXERIL Take 1 tablet (10 mg total) by mouth 3 (three) times daily as needed for muscle spasms. This can make you sleepy.   dapagliflozin propanediol 10 MG Tabs tablet Commonly known as: Farxiga Take 1 tablet (10 mg total) by mouth daily before breakfast.   docusate sodium 100 MG capsule Commonly known as: COLACE Take  100 mg by mouth daily as needed for mild constipation.   hydrocortisone 2.5 % rectal cream Commonly known as: ANUSOL-HC Place 1 Application rectally 2 (two) times daily.   omeprazole 20 MG capsule Commonly known as: PRILOSEC Take 20 mg by mouth daily.   sacubitril-valsartan 24-26 MG Commonly known as: ENTRESTO Take 1 tablet by mouth 2 (two) times daily.   spironolactone 25 MG tablet Commonly known as: ALDACTONE TAKE 1 TABLET BY MOUTH DAILY   torsemide 20 MG tablet Commonly known as: DEMADEX Take 2 tablets (40 mg total) by mouth daily. Extra torsenmide 40 in pm for 3 pound weight gain What changed: additional instructions        Allergies  Allergen Reactions   Geodon [Ziprasidone] Other (See Comments)    paralysis   Tramadol Other (See Comments)    Pt stated that it gave him sores.   Amoxicillin Rash   Zithromax [Azithromycin] Rash    Consultations: Cardio    Procedures/Studies: EP PPM/ICD IMPLANT  Result Date: 12/16/2022  CONCLUSIONS:  1.  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 Successful ICD implantation with a Medtronic VVI ICD implanted for primary prevention of sudden death.  4.  No early apparent complications.   (Echo, Carotid, EGD, Colonoscopy, ERCP)    Subjective: Pt c/o fatigue    Discharge Exam: Vitals:   12/18/22 0754 12/18/22 1204  BP: 129/72 (!) 147/98  Pulse: (!) 55 (!) 112  Resp: 16 16  Temp: (!) 97.5 F (36.4 C) 97.8 F (36.6 C)  SpO2: 96% 99%   Vitals:   12/18/22 0410 12/18/22 0543 12/18/22 0754 12/18/22 1204  BP: (!) 133/92  129/72 (!) 147/98  Pulse: (!) 107  (!) 55 (!) 112  Resp: 20  16 16   Temp: 97.9 F (36.6 C)  (!) 97.5 F (36.4 C) 97.8 F (36.6 C)  TempSrc:      SpO2: 91%  96% 99%  Weight:  (!) 162 kg    Height:        General: Pt is alert, awake, not in acute distress Cardiovascular: S1/S2 +, no rubs, no gallops Respiratory:  decreased breath sounds b/l  Abdominal: Soft, NT, obese, bowel sounds + Extremities: b/l LE edema, no cyanosis    The results of significant diagnostics from this hospitalization (including imaging, microbiology, ancillary and laboratory) are listed below for reference.     Microbiology: No results found for this or any previous visit (from the past 240 hour(s)).   Labs: BNP (last 3 results) Recent Labs    08/05/22 1448 11/04/22 1156 12/16/22 1447  BNP 93.0 41.1 117.4*   Basic Metabolic Panel: Recent Labs  Lab 12/16/22 1447 12/17/22 0551 12/18/22 0708  NA 136 135 135  K 3.6 3.8 4.0  CL 97* 100 99  CO2 28 23 26   GLUCOSE 112* 134* 128*  BUN 19 25* 28*  CREATININE 1.05 1.21 1.12  CALCIUM 9.1 9.1 9.1  MG  --  2.1  --    Liver Function Tests: Recent Labs  Lab 12/17/22 0551  AST 22  ALT 13  ALKPHOS 87  BILITOT 0.4  PROT 8.3*  ALBUMIN 3.7   No results for input(s): "LIPASE", "AMYLASE" in the last 168 hours. No results for input(s): "AMMONIA" in the last 168 hours. CBC: Recent Labs  Lab 12/16/22 1447 12/17/22 0551 12/18/22 0508  WBC 8.8 8.1 10.8*  HGB 13.4 13.2 13.3  HCT 43.5 42.9 41.1  MCV 72.7* 73.1* 71.9*  PLT 304 287 213   Cardiac Enzymes: No results for input(s): "CKTOTAL", "CKMB", "CKMBINDEX", "TROPONINI" in the last 168 hours. BNP: Invalid input(s): "POCBNP" CBG: No results for input(s): "GLUCAP" in the last 168 hours. D-Dimer No results for input(s): "DDIMER" in the last 72 hours. Hgb A1c No results for input(s): "HGBA1C" in the last 72 hours. Lipid Profile No results for input(s): "CHOL", "HDL", "LDLCALC", "TRIG", "CHOLHDL", "LDLDIRECT" in the last 72 hours. Thyroid function studies Recent Labs    12/17/22 0551  TSH 3.583   Anemia work up Recent Labs    12/18/22 0508 12/18/22 0708  FERRITIN 36  --   TIBC  --  409  IRON  --  33*   Urinalysis    Component Value Date/Time   APPEARANCEUR Clear 04/24/2021 1117   GLUCOSEU 3+ (A)  04/24/2021 1117   BILIRUBINUR Negative 04/24/2021 1117   PROTEINUR 1+ (A) 04/24/2021 1117   NITRITE Negative 04/24/2021 1117   LEUKOCYTESUR Negative 04/24/2021 1117   Sepsis Labs Recent Labs  Lab 12/16/22 1447 12/17/22 0551 12/18/22 0508  WBC 8.8 8.1 10.8*   Microbiology No results found for this or any previous visit (from the past 240 hour(s)).   Time coordinating discharge: Over 30 minutes  SIGNED:   Charise Killian, MD  [facility] 12/18/2022, 12:59 PM Pager   If 7PM-7AM, please contact night-coverage [email or web address]

## 2022-12-18 NOTE — Progress Notes (Signed)
   Heart Failure Nurse Navigator Note   Met with patient and his wife Marc [name] today.  He was sitting on the edge of the bed, states he feels better than yesterday.  Reviewed subjects that we had discussed yesterday.  He realizes what he needs to do to be taking better care of himself.  He was given the dietitary handout on low sodium, along with living with heart failure booklet, zone magnet and weight charts.  They had no further questions.  Tresa Endo RN CHFN

## 2022-12-18 NOTE — Progress Notes (Signed)
Rounding Note    Patient Name: Marc [name] Date of Encounter: 12/18/2022  [facility] Cardiologist: Advanced heart failure clinic/[place]  Subjective   Wife at the bedside Reports feeling relatively well this morning, denies significant abdominal distention Long discussion concerning habits at home, High intake of Gatorade and other beverages Realizes his weight is a problem, determined to change diet to lose weight This admission with device placed, started on amiodarone infusion for PVCs in bigeminal pattern, plan to transition to oral amiodarone at discharge   Inpatient Medications    Scheduled Meds:  aspirin EC  81 mg Oral Daily   Chlorhexidine Gluconate Cloth  6 each Topical Q0600   enoxaparin (LOVENOX) injection  0.5 mg/kg Subcutaneous Q24H   nicotine  21 mg Transdermal Daily   pantoprazole  40 mg Oral Daily   sacubitril-valsartan  1 tablet Oral BID   spironolactone  25 mg Oral Daily   torsemide  40 mg Oral Daily   Continuous Infusions:  amiodarone Stopped (12/18/22 1357)   PRN Meds: acetaminophen, albuterol, cyclobenzaprine, dextromethorphan-guaiFENesin, hydrALAZINE, LORazepam, ondansetron (ZOFRAN) IV, mouth rinse, phenol, torsemide   Vital Signs    Vitals:   12/18/22 0410 12/18/22 0543 12/18/22 0754 12/18/22 1204  BP: (!) 133/92  129/72 (!) 147/98  Pulse: (!) 107  (!) 55 (!) 112  Resp: 20  16 16   Temp: 97.9 F (36.6 C)  (!) 97.5 F (36.4 C) 97.8 F (36.6 C)  TempSrc:      SpO2: 91%  96% 99%  Weight:  (!) 162 kg    Height:        Intake/Output Summary (Last 24 hours) at 12/18/2022 1739 Last data filed at 12/18/2022 1419 Gross per 24 hour  Intake 1320 ml  Output 1550 ml  Net -230 ml      12/18/2022    5:43 AM 12/17/2022    5:02 AM 12/16/2022    7:01 AM  Last 3 Weights  Weight (lbs) 357 lb 2.3 oz 349 lb 6.9 oz 348 lb  Weight (kg) 162 kg 158.5 kg 157.852 kg      Telemetry    Normal sinus rhythm with PVCs- Personally Reviewed  ECG      - Personally Reviewed  Physical Exam   GEN: No acute distress.  Obesity Neck: No JVD Cardiac: RRR, ectopy no murmurs, rubs, or gallops.  Respiratory: Clear to auscultation bilaterally. GI: Soft, nontender, non-distended  MS: No edema; No deformity. Neuro:  Nonfocal  Psych: Normal affect   Labs    High Sensitivity Troponin:  No results for input(s): "TROPONINIHS" in the last 720 hours.   Chemistry Recent Labs  Lab 12/16/22 1447 12/17/22 0551 12/18/22 0708  NA 136 135 135  K 3.6 3.8 4.0  CL 97* 100 99  CO2 28 23 26   GLUCOSE 112* 134* 128*  BUN 19 25* 28*  CREATININE 1.05 1.21 1.12  CALCIUM 9.1 9.1 9.1  MG  --  2.1  --   PROT  --  8.3*  --   ALBUMIN  --  3.7  --   AST  --  22  --   ALT  --  13  --   ALKPHOS  --  87  --   BILITOT  --  0.4  --   GFRNONAA >60 >60 >60  ANIONGAP 11 12 10     Lipids No results for input(s): "CHOL", "TRIG", "HDL", "LABVLDL", "LDLCALC", "CHOLHDL" in the last 168 hours.  Hematology Recent Labs  Lab  12/16/22 1447 12/17/22 0551 12/18/22 0508  WBC 8.8 8.1 10.8*  RBC 5.98* 5.87* 5.72  HGB 13.4 13.2 13.3  HCT 43.5 42.9 41.1  MCV 72.7* 73.1* 71.9*  MCH 22.4* 22.5* 23.3*  MCHC 30.8 30.8 32.4  RDW 17.4* 17.6* 17.3*  PLT 304 287 213   Thyroid  Recent Labs  Lab 12/17/22 0551  TSH 3.583  FREET4 0.96    BNP Recent Labs  Lab 12/16/22 1447  BNP 117.4*    DDimer No results for input(s): "DDIMER" in the last 168 hours.   Radiology    No results found.  Cardiac Studies   Echo  1. Left ventricular ejection fraction, by estimation, is 20 to 25%. The  left ventricle has severely decreased function. The left ventricle  demonstrates regional wall motion abnormalities (see scoring  diagram/findings for description). The left  ventricular internal cavity size was moderately to severely dilated. Left  ventricular diastolic parameters were normal.   2. Right ventricular systolic function is normal. The right ventricular  size is  normal.   3. The mitral valve is normal in structure. Mild mitral valve  regurgitation. No evidence of mitral stenosis.   4. The aortic valve is normal in structure. Aortic valve regurgitation is  not visualized. No aortic stenosis is present.   5. The inferior vena cava is normal in size with greater than 50%  respiratory variability, suggesting right atrial pressure of 3 mmHg.   Patient Profile     Marc [name] is being admitted to the hospital after ICD implant for acute systolic CHF ejection fraction of around 20%.  He is morbidly obese and also has a history of hypertension.   Assessment & Plan    Acute on chronic systolic CHF New [place] Heart Association class III on arrival Nonischemic based on heart catheterization 2022 Ejection fraction 20 to 25%, followed by advanced heart failure clinic Treated with IV Lasix this admission twice daily I/O not well recorded though he reports significant diuresis 5 doses IV Lasix given over 3 days, 80 mg, 80 mg, 40 mg, 40 mg, 40 mg -Given climbing BUN, dramatic improvement clinically, will transition back to torsemide 40 daily with extra 40 after lunch for 3 pound weight gain We have recommended he restart his Entresto 24/26 mg twice daily, spironolactone 25 daily EP has recommended holding beta-blocker at this time as he is on amiodarone Continue Farxiga 10 daily  Frequent PVCs in a bigeminal pattern Started on amiodarone infusion, will transition to amiodarone 400 twice daily for 1 week then 400 daily  Long discussion concerning CHF management, fluid restriction, weight loss, daily weights, adjusting his diuretics depending on his weight  Total encounter time more than 50 minutes  Greater than 50% was spent in counseling and coordination of care with the patient   For questions or updates, please contact [facility] Please consult [email or web address] for contact info under        Signed, Julien Nordmann, MD  12/18/2022, 5:39 PM

## 2022-12-21 ENCOUNTER — Telehealth: Payer: Self-pay | Admitting: *Deleted

## 2022-12-21 NOTE — Transitions of Care (Post Inpatient/ED Visit) (Signed)
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 RN explained that he has to heal and build his strength back up. Patient will also talk with Cardiologist about when he will start cardiac rehab Patient Questions/Concerns Addressed: Other:  Items Reviewed: Did you receive and understand the discharge instructions provided?: Yes Medications obtained,verified, and reconciled?: Yes (Medications Reviewed) Any new allergies since your discharge?: Yes Dietary orders reviewed?: Yes Type of Diet Ordered:: Low sodium heart healthy Do you have support at home?: Yes People in Home: spouse Name of Support/Comfort Primary Source: Marc [name]  Medications Reviewed Today: Medications Reviewed Today     Reviewed by Luella Cook, RN (Case Manager) on 12/21/22 at 1036  Med List Status: <None>   Medication Order Taking? Sig Documenting Provider Last Dose Status Informant  albuterol (VENTOLIN HFA) 108 (90 Base) MCG/ACT inhaler [id number] Yes Inhale 2 puffs into the lungs every 6 (six) hours as needed for wheezing or shortness of breath. Burnadette Pop, MD Taking Active   amiodarone (PACERONE) 400 MG tablet [id number] Yes 400mg  BID x 1 week and then 400mg  daily thereafter Charise Killian, MD Taking Active   aspirin EC 81 MG tablet [id number] Yes Take 81 mg by mouth daily. [provider]  Taking Active Self  cyclobenzaprine (FLEXERIL) 10 MG tablet [id number] Yes Take 1 tablet (10 mg total) by mouth 3 (three) times daily as needed for muscle spasms. This can make you sleepy. Drake Leach, PA-C Taking Active   dapagliflozin propanediol (FARXIGA) 10 MG TABS tablet [id number] Yes Take 1 tablet (10 mg total) by mouth daily before breakfast. Delma Freeze, FNP Taking Active   docusate sodium (COLACE) 100 MG capsule [id number] Yes Take 100 mg by mouth daily as needed for mild constipation. [provider] Taking Active   hydrocortisone (ANUSOL-HC) 2.5 % rectal cream [id number] Yes Place 1 Application rectally 2 (two) times daily. [name], MD Taking Active   omeprazole (PRILOSEC) 20 MG capsule [id number] Yes Take 20 mg by mouth daily. [provider] Taking Active   sacubitril-valsartan (ENTRESTO) 24-26 MG [id number] Yes Take 1 tablet by mouth 2 (two) times daily. Clarisa Kindred A, FNP Taking Active   spironolactone (ALDACTONE) 25 MG tablet [id number] Yes TAKE 1 TABLET BY MOUTH DAILY Delma Freeze, FNP Taking Active   torsemide (DEMADEX) 20 MG tablet [id number] Yes Take 2 tablets (40 mg total) by mouth daily. Extra torsenmide 40 in pm for 3 pound weight gain Charise Killian, MD Taking Active             Home Care and Equipment/Supplies: Were Home Health Services Ordered?: No Any new equipment or medical supplies ordered?: No  Functional Questionnaire: Do you need assistance with bathing/showering or dressing?: Yes Do you need assistance with meal preparation?: Yes Do you need assistance with eating?: No Do you have difficulty maintaining continence: No Do you need assistance with getting out of bed/getting out of a chair/moving?: No Do you have difficulty  managing or taking your medications?: No  Follow up appointments reviewed: PCP Follow-up appointment confirmed?: Yes Date of PCP follow-up appointment?: 12/25/22 Follow-up Provider: [name] Specialist [facility] Follow-up appointment confirmed?: Yes Date of Specialist follow-up appointment?: 01/04/23 Follow-Up Specialty Provider:: Cardiologist Do you need transportation to your follow-up appointment?: No Do you understand care options if your condition(s) worsen?: Yes-patient verbalized understanding  SDOH Interventions Today    Flowsheet Row Most Recent Value  SDOH Interventions   Food Insecurity Interventions Intervention Not Indicated  Housing Interventions Intervention Not Indicated  Transportation Interventions Intervention Not Indicated, Patient Resources (Friends/Family)      Interventions Today    Flowsheet Row Most Recent Value  Chronic Disease   Chronic disease during today's visit Congestive Heart Failure (CHF)  General Interventions   General Interventions Discussed/Reviewed General Interventions Discussed, General Interventions Reviewed, Doctor Visits, Referral to Nurse  [name] to Care Coordinator [facility] [address]]  Doctor Visits Discussed/Reviewed Doctor Visits Discussed, Doctor Visits Reviewed  Education Interventions   Education Provided Provided Education  Provided Verbal Education On --  [ICD care]  Nutrition Interventions   Nutrition Discussed/Reviewed Nutrition Discussed, Nutrition Reviewed, Decreasing salt  Pharmacy Interventions   Pharmacy Dicussed/Reviewed Pharmacy Topics Discussed, Pharmacy Topics Reviewed      TOC Interventions Today    Flowsheet Row Most Recent Value  TOC Interventions   TOC Interventions Discussed/Reviewed TOC Interventions Discussed, TOC Interventions Reviewed  [RN discussed making sure he wears his CPAP as ordered. RN discussed ICD care.]      RN referred to [facility] Coordinator  appointment [id number] 3:00  Gean Maidens BSN RN [facility] [phone]

## 2022-12-22 ENCOUNTER — Ambulatory Visit: Payer: Medicare Other | Admitting: Student in an Organized Health Care Education/Training Program

## 2022-12-23 ENCOUNTER — Encounter: Payer: Self-pay | Admitting: *Deleted

## 2022-12-25 ENCOUNTER — Telehealth (HOSPITAL_COMMUNITY): Payer: Self-pay | Admitting: Emergency Medicine

## 2022-12-25 ENCOUNTER — Telehealth (HOSPITAL_COMMUNITY): Payer: Self-pay | Admitting: *Deleted

## 2022-12-25 ENCOUNTER — Ambulatory Visit: Payer: Medicare Other | Admitting: Nurse Practitioner

## 2022-12-25 NOTE — Progress Notes (Unsigned)
Cardiology Office Note Date:  12/29/2022  Patient ID:  Marc [name], DOB [birth year], MRN [id number] PCP:  [name], NP  Cardiologist:  None HF Cardiologist: Dr. Gasper Lloyd Electrophysiologist: Lanier Prude, MD    Chief Complaint: ICD wound check  History of Present Illness: [name] is a 47 y.o. male with PMH notable for NICM, HFrEF s/p recent ICD placement, HTN; seen today for Lanier Prude, MD for post hospital follow up.   Admitted 5/1-3/24. At presentation for ICD placement, patient c/o worsening SOB and weight gain. He was also in bigeminal PVCs. ICD placed without incident and patient was admitted post-operatively for AoC HF exacerbation and frequent PVCs. He was started on amiodarone gtt, converted to PO at discharge. Diuresed with IV lasix.   Scheduled for sarcoid PET CT this am, but was cancelled d/t no insurance approval as of yet. To be rescheduled once insurance approves  He is feeling overall well. Continues to have robust UOP with AM torsemide. Thinks he still has a little extra fluid, but much improved than prior to hospitalization. He takes torsemide 40mg  in AM and then extra 20mg  PRN in afternoons. He has only taken PRN dose one time since discharge.   He is being much more diligent about monitoring his fluid and salt intake.   Has follow-up appt with Adv HF next week.  Device Information: MDT single chamber ICD, imp 12/2022; dx HF  AAD History: Amiodarone   Past Medical History:  Diagnosis Date   CHF (congestive heart failure) (HCC)    Hypertension     Past Surgical History:  Procedure Laterality Date   BICEPS TENDON REPAIR     COLONOSCOPY WITH PROPOFOL N/A 10/09/2020   Procedure: COLONOSCOPY WITH PROPOFOL;  Surgeon: [name], MD;  Location: [facility] ENDOSCOPY;  Service: Gastroenterology;  Laterality: N/A;   FLEXIBLE SIGMOIDOSCOPY N/A 10/30/2020   Procedure: FLEXIBLE SIGMOIDOSCOPY;  Surgeon: [name], MD;   Location: [facility] ENDOSCOPY;  Service: Gastroenterology;  Laterality: N/A;   FOOT SURGERY     metataral and fasciotomy   ICD IMPLANT N/A 12/16/2022   Procedure: ICD IMPLANT;  Surgeon: Lanier Prude, MD;  Location: [facility] INVASIVE CV LAB;  Service: Cardiovascular;  Laterality: N/A;   RIGHT/LEFT HEART CATH AND CORONARY ANGIOGRAPHY Bilateral 09/19/2020   Procedure: RIGHT/LEFT HEART CATH AND CORONARY ANGIOGRAPHY;  Surgeon: Alwyn Pea, MD;  Location: [facility] INVASIVE CV LAB;  Service: Cardiovascular;  Laterality: Bilateral;   ROTATOR CUFF REPAIR      Current Outpatient Medications  Medication Instructions   albuterol (VENTOLIN HFA) 108 (90 Base) MCG/ACT inhaler 2 puffs, Inhalation, Every 6 hours PRN   amiodarone (PACERONE) 400 MG tablet 400mg  BID x 1 week and then 400mg  daily thereafter   aspirin EC 81 mg, Oral, Daily   cyclobenzaprine (FLEXERIL) 10 mg, Oral, 3 times daily PRN, This can make you sleepy.   dapagliflozin propanediol (FARXIGA) 10 mg, Oral, Daily before breakfast   docusate sodium (COLACE) 100 mg, Oral, Daily PRN   hydrocortisone (ANUSOL-HC) 2.5 % rectal cream 1 Application, Rectal, 2 times daily   omeprazole (PRILOSEC) 20 mg, Oral, Daily   sacubitril-valsartan (ENTRESTO) 24-26 MG 1 tablet, Oral, 2 times daily   spironolactone (ALDACTONE) 25 mg, Oral, Daily   torsemide (DEMADEX) 40 mg, Oral, Daily, Extra torsenmide 40 in pm for 3 pound weight gain    Social History:  The patient  reports that he has been smoking cigarettes. He has a 12.50 pack-year smoking history. He  has never used smokeless tobacco. He reports that he does not currently use alcohol. He reports that he does not currently use drugs.   Family History:   The patient's family history includes Dementia in his mother; Diabetes in his father and paternal grandfather; Hypertension in his father and paternal grandfather; Stroke in his mother.  ROS:  Please see the history of present illness. All other systems are  reviewed and otherwise negative.   PHYSICAL EXAM:  VS:  BP 118/78 (BP Location: Left Arm, Patient Position: Sitting)   Pulse 98   Ht 5\' 9"  (1.753 m)   Wt (!) 354 lb 12.8 oz (160.9 kg)   SpO2 98%   BMI 52.39 kg/m  BMI: Body mass index is 52.39 kg/m.  GEN- The patient is well appearing, alert and oriented x 3 today.   Lungs- Clear to ausculation bilaterally, normal work of breathing.  Heart- Regular rate and rhythm, no murmurs, rubs or gallops GI - obese, soft abd Extremities- Trace peripheral edema, warm, dry Skin-  device pocket incision c/d/i, no tethering   Device interrogation done today and reviewed by myself:  Battery 13+ years Lead threshold, impedence, sensing stable  No episodes No changes made today Steri strips removed easily No hematoma   EKG is ordered. Personal review of EKG from today shows:  NSR, rate 98bpm  Recent Labs: 12/16/2022: B Natriuretic Peptide 117.4 12/17/2022: ALT 13; Magnesium 2.1; TSH 3.583 12/18/2022: BUN 28; Creatinine, Ser 1.12; Hemoglobin 13.3; Platelets 213; Potassium 4.0; Sodium 135  02/20/2022: Chol/HDL Ratio 4.4; Cholesterol, Total 197; HDL 45; LDL Chol Calc (NIH) 114; Triglycerides 218   Estimated Creatinine Clearance: 121.8 mL/min (by C-G formula based on SCr of 1.12 mg/dL).   Wt Readings from Last 3 Encounters:  12/29/22 (!) 354 lb 12.8 oz (160.9 kg)  12/18/22 (!) 357 lb 2.3 oz (162 kg)  12/07/22 (!) 348 lb 6.4 oz (158 kg)     Additional studies reviewed include: Previous EP, cardiology notes.   ICD implant, 12/16/2022  1.  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 Successful ICD implantation with a Medtronic VVI ICD implanted for primary prevention of sudden death.   4.  No early apparent complications.   TTE, 11/04/2022  1. Left ventricular ejection fraction, by estimation, is 20 to 25%. The left ventricle has severely decreased function. The left  ventricle demonstrates regional wall motion abnormalities (see scoring diagram/findings for description). The left ventricular internal cavity size was moderately to severely dilated. Left ventricular diastolic parameters were normal.   2. Right ventricular systolic function is normal. The right ventricular size is normal.   3. The mitral valve is normal in structure. Mild mitral valve regurgitation. No evidence of mitral stenosis.   4. The aortic valve is normal in structure. Aortic valve regurgitation is not visualized. No aortic stenosis is present.   5. The inferior vena cava is normal in size with greater than 50% respiratory variability, suggesting right atrial pressure of 3 mmHg.     ASSESSMENT AND PLAN:  #) HFrEF Improved fluid status NYHA II-III symptoms Warm on exam GDMT: farxiga, 24-26 entresto, spiro Diuretic: torsemide 40mg  in AM, 20mg  PRN in PM   #) PVC No PVCs on today's EKG Cont 400mg  amiodarone daily  - will need amio labs at follow-up  #) s/p ICD placement Device functioning well, see paceart for details Steri-strips easily removed  Current medicines are reviewed at length with the patient today.   The patient does not have concerns regarding his medicines.  The following changes were made today:  none  Labs/ tests ordered today include:  Orders Placed This Encounter  Procedures   EKG 12-Lead     Disposition: Follow up with Dr. Lalla Brothers in as usual post procedure   Signed, Sherie Don, NP  12/29/22  2:59 PM  Electrophysiology [facility]

## 2022-12-25 NOTE — Telephone Encounter (Signed)
Patient returning call about his upcoming cardiac imaging study; pt verbalizes understanding of appt date/time, parking situation and where to check in; name and call back number provided for further questions should they arise  Larey Brick RN Navigator Cardiac Imaging [facility] [phone] office [phone] cell  Reviewed diet prep with patient. He verbalized understanding. He is aware to not eat after 5pm nor exercise on Monday.

## 2022-12-25 NOTE — Telephone Encounter (Signed)
Attempted to call patient regarding upcoming cardiac PET appointment. Left message on voicemail with name and callback number Makayia Duplessis RN Navigator Cardiac Imaging [facility] [phone] Office [phone] Cell  

## 2022-12-29 ENCOUNTER — Encounter: Payer: Self-pay | Admitting: Cardiology

## 2022-12-29 ENCOUNTER — Ambulatory Visit (HOSPITAL_COMMUNITY)
Admission: RE | Admit: 2022-12-29 | Discharge: 2022-12-29 | Disposition: A | Discharge status: Home / Self Care | Payer: Medicare Other | Source: Ambulatory Visit | Attending: Cardiology | Admitting: Cardiology

## 2022-12-29 ENCOUNTER — Ambulatory Visit (INDEPENDENT_AMBULATORY_CARE_PROVIDER_SITE_OTHER): Payer: Medicare Other | Admitting: Cardiology

## 2022-12-29 VITALS — BP 118/78 | HR 98 | Ht 69.0 in | Wt 354.8 lb

## 2022-12-29 DIAGNOSIS — I5022 Chronic systolic (congestive) heart failure: Secondary | ICD-10-CM

## 2022-12-29 DIAGNOSIS — I493 Ventricular premature depolarization: Secondary | ICD-10-CM | POA: Insufficient documentation

## 2022-12-29 DIAGNOSIS — I428 Other cardiomyopathies: Secondary | ICD-10-CM | POA: Diagnosis not present

## 2022-12-29 LAB — CUP PACEART INCLINIC DEVICE CHECK
Date Time Interrogation Session: 20240514151207
Implantable Lead Connection Status: 753985
Implantable Lead Implant Date: 20240501
Implantable Lead Location: 753860
Implantable Pulse Generator Implant Date: 20240501

## 2022-12-29 NOTE — Patient Instructions (Addendum)
Medication Instructions:  Your physician recommends that you continue on your current medications as directed. Please refer to the Current Medication list given to you today.  *If you need a refill on your cardiac medications before your next appointment, please call your pharmacy*   Lab Work: No labs ordered  If you have labs (blood work) drawn today and your tests are completely normal, you will receive your results only by: MyChart Message (if you have MyChart) OR A paper copy in the mail If you have any lab test that is abnormal or we need to change your treatment, we will call you to review the results.   Testing/Procedures: No testing ordered  Follow-Up: At [facility], you and your health needs are our priority.  As part of our continuing mission to provide you with exceptional heart care, we have created designated Provider Care Teams.  These Care Teams include your primary Cardiologist (physician) and Advanced Practice Providers (APPs -  Physician Assistants and Nurse Practitioners) who all work together to provide you with the care you need, when you need it.  We recommend signing up for the patient portal called "MyChart".  Sign up information is provided on this After Visit Summary.  MyChart is used to connect with patients for Virtual Visits (Telemedicine).  Patients are able to view lab/test results, encounter notes, upcoming appointments, etc.  Non-urgent messages can be sent to your provider as well.   To learn more about what you can do with MyChart, go to [email or web address].    Your next appointment:   As already scheduled on 03/24/23   Provider:   Steffanie Dunn, MD   Other Instructions OK to shower Let warm soapy water run down incision Do not scrub incision Pat dry with towel  Keep incision open to air - no ointments, creams, salves, or bandages.  Call device clinic if incision opens, has drainage, or begins to hurt more.

## 2022-12-30 ENCOUNTER — Ambulatory Visit: Payer: Medicare Other

## 2023-01-01 ENCOUNTER — Telehealth: Payer: Self-pay | Admitting: *Deleted

## 2023-01-01 NOTE — Progress Notes (Signed)
  Care Coordination Note  01/01/2023 Name: Marc [name] MRN: [id number] DOB: June 21, 1976  Marc [name] is a 47 y.o. year old male who is a primary care patient of [name], NP and is actively engaged with the care management team. I reached out to Kandis Fantasia Marsico by phone today to assist with re-scheduling a follow up visit with the RN Case Manager  Follow up plan: Unsuccessful telephone outreach attempt made. A HIPAA compliant phone message was left for the patient providing contact information and requesting a return call.   Burman Nieves, CCMA Care Coordination Care Guide Direct Dial: [phone]

## 2023-01-04 ENCOUNTER — Encounter: Payer: Medicare Other | Admitting: Cardiology

## 2023-01-04 ENCOUNTER — Encounter: Payer: Self-pay | Admitting: Cardiology

## 2023-01-04 ENCOUNTER — Ambulatory Visit (HOSPITAL_BASED_OUTPATIENT_CLINIC_OR_DEPARTMENT_OTHER): Payer: Medicare Other | Admitting: Cardiology

## 2023-01-04 ENCOUNTER — Other Ambulatory Visit
Admission: RE | Admit: 2023-01-04 | Discharge: 2023-01-04 | Disposition: A | Discharge status: Home / Self Care | Payer: Medicare Other | Source: Ambulatory Visit | Attending: Cardiology | Admitting: Cardiology

## 2023-01-04 VITALS — BP 111/84 | HR 102 | Wt 357.2 lb

## 2023-01-04 DIAGNOSIS — I5022 Chronic systolic (congestive) heart failure: Secondary | ICD-10-CM | POA: Diagnosis not present

## 2023-01-04 DIAGNOSIS — I493 Ventricular premature depolarization: Secondary | ICD-10-CM | POA: Diagnosis not present

## 2023-01-04 LAB — COMPREHENSIVE METABOLIC PANEL
ALT: 14 U/L (ref 0–44)
AST: 20 U/L (ref 15–41)
Albumin: 3.8 g/dL (ref 3.5–5.0)
Alkaline Phosphatase: 104 U/L (ref 38–126)
Anion gap: 10 (ref 5–15)
BUN: 19 mg/dL (ref 6–20)
CO2: 23 mmol/L (ref 22–32)
Calcium: 9.1 mg/dL (ref 8.9–10.3)
Chloride: 104 mmol/L (ref 98–111)
Creatinine, Ser: 1.05 mg/dL (ref 0.61–1.24)
GFR, Estimated: >60 mL/min (ref >60)
Glucose, Bld: 97 mg/dL (ref 70–99)
Potassium: 4.1 mmol/L (ref 3.5–5.1)
Sodium: 137 mmol/L (ref 135–145)
Total Bilirubin: 0.7 mg/dL (ref 0.3–1.2)
Total Protein: 7.8 g/dL (ref 6.5–8.1)

## 2023-01-04 LAB — TSH: TSH: 5.289 u[IU]/mL — ABNORMAL HIGH (ref 0.350–4.500)

## 2023-01-04 LAB — BRAIN NATRIURETIC PEPTIDE: B Natriuretic Peptide: 89.8 pg/mL (ref 0.0–100.0)

## 2023-01-04 MED ORDER — METOPROLOL SUCCINATE ER 25 MG PO TB24: 25.0000 mg | ORAL_TABLET | Freq: Every day | ORAL | 3 refills | Status: DC

## 2023-01-04 NOTE — Progress Notes (Signed)
ADVANCED HEART FAILURE CLINIC NOTE  Referring Physician: [name], NP  Primary Care: [name], NP  HPI: Marc [name] is a 47 y.o. male with HFrEF 2/2 NICM, OSA on CPAP, hx of tobacco use, morbid obesity presenting today to establish care. [name] reports first being diagnosed with systolic heart failure around 1610 at [facility]. He was diagnosed with the flu around that time followed by persistent SOB and diagnosis of nonischemic cardiomyopathy. 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 He was followed for several years at [facility] with LVEF ranging from 25-35%. He most recently presented to the [facility] ER in December 2023 with complaints of chest discomfort. At that time he was referred to HF clinic for further evaluation.  He had an echocardiogram in June 2023 demonstrating an LVEF of 25% and a repeat echo today w/ persistently reduced function.  He is now s/p ICD implant in 12/2022.   Interval hx:  Patient underwent ICD implant in May 2024.  He was admitted during that time for 2 days due to volume overload requiring IV diuresis.  Since discharge she reports mild to moderate improvement in his overall exercise capacity however still remains limited mostly due to sciatica/lower extremity and low back pain.  He has also been seen by neurosurgery now with plan to start physical therapy.  Otherwise he is doing fairly well.  Reports 3 pound weight gain over the past 2 to 3 days.   Activity level/exercise tolerance:  NYHA IIB, limited by significant fatigue and neuropathic pain (sciatica); at times he reports being able to barely stand.  Orthopnea:  Sleeps on 3 pillows Paroxysmal noctural dyspnea:  No Chest pain/pressure:  No Orthostatic lightheadedness:  No Palpitations:  No Lower extremity edema:  No Presyncope/syncope:  No Cough:  No  Past Medical History:  Diagnosis Date   CHF (congestive heart failure) (HCC)     Hypertension     Current Outpatient Medications  Medication Sig Dispense Refill   albuterol (VENTOLIN HFA) 108 (90 Base) MCG/ACT inhaler Inhale 2 puffs into the lungs every 6 (six) hours as needed for wheezing or shortness of breath. 18 g 0   amiodarone (PACERONE) 400 MG tablet 400mg  BID x 1 week and then 400mg  daily thereafter 30 tablet 0   aspirin EC 81 MG tablet Take 81 mg by mouth daily.     cyclobenzaprine (FLEXERIL) 10 MG tablet Take 1 tablet (10 mg total) by mouth 3 (three) times daily as needed for muscle spasms. This can make you sleepy. 90 tablet 0   dapagliflozin propanediol (FARXIGA) 10 MG TABS tablet Take 1 tablet (10 mg total) by mouth daily before breakfast. 90 tablet 3   docusate sodium (COLACE) 100 MG capsule Take 100 mg by mouth daily as needed for mild constipation.     hydrocortisone (ANUSOL-HC) 2.5 % rectal cream Place 1 Application rectally 2 (two) times daily. 30 g 0   omeprazole (PRILOSEC) 20 MG capsule Take 20 mg by mouth daily.     sacubitril-valsartan (ENTRESTO) 24-26 MG Take 1 tablet by mouth 2 (two) times daily. 180 tablet 3   spironolactone (ALDACTONE) 25 MG tablet TAKE 1 TABLET BY MOUTH DAILY 100 tablet 2   torsemide (DEMADEX) 20 MG tablet Take 2 tablets (40 mg total) by mouth daily. Extra torsenmide 40 in pm for 3 pound weight gain     No current facility-administered medications for this visit.    Allergies  Allergen Reactions   Geodon [Ziprasidone] Other (See Comments)    paralysis   Tramadol Other (See Comments)    Pt stated that it gave him sores.   Amoxicillin Rash   Zithromax [Azithromycin] Rash      Social History   Socioeconomic History   Marital status: Married    Spouse name: Not on file   Number of children: Not on file   Years of education: Not on file   Highest education level: Not on file  Occupational History   Not on file  Tobacco Use   Smoking status: Every Day    Packs/day: 0.50    Years: 25.00    Additional pack  years: 0.00    Total pack years: 12.50    Types: Cigarettes   Smokeless tobacco: Never  Vaping Use   Vaping Use: Never used  Substance and Sexual Activity   Alcohol use: Not Currently   Drug use: Not Currently   Sexual activity: Yes  Other Topics Concern   Not on file  Social History Narrative   Wife, Marc [name], at bedside.   Social Determinants of Health   Financial Resource Strain: Low Risk  (05/26/2022)   Overall Financial Resource Strain (CARDIA)    Difficulty of Paying Living Expenses: Not hard at all  Food Insecurity: No Food Insecurity (12/21/2022)   Hunger Vital Sign    Worried About Running Out of Food in the Last Year: Never true    Ran Out of Food in the Last Year: Never true  Transportation Needs: No Transportation Needs (12/21/2022)   PRAPARE - Administrator, Civil Service (Medical): No    Lack of Transportation (Non-Medical): No  Physical Activity: Inactive (05/26/2022)   Exercise Vital Sign    Days of Exercise per Week: 0 days    Minutes of Exercise per Session: 0 min  Stress: Stress Concern Present (05/26/2022)   [employer] of Occupational Health - Occupational Stress Questionnaire    Feeling of Stress : To some extent  Social Connections: Moderately Integrated (05/26/2022)   Social Connection and Isolation Panel [NHANES]    Frequency of Communication with Friends and Family: Once a week    Frequency of Social Gatherings with Friends and Family: Twice a week    Attends Religious Services: More than 4 times per year    Active Member of [employer] or Organizations: No    Attends Banker Meetings: Never    Marital Status: Married  Catering manager Violence: Not At Risk (05/26/2022)   Humiliation, Afraid, Rape, and Kick questionnaire    Fear of Current or Ex-Partner: No    Emotionally Abused: No    Physically Abused: No    Sexually Abused: No      Family History  Problem Relation Age of Onset   Stroke Mother    Dementia Mother     Hypertension Father    Diabetes Father    Hypertension Paternal Grandfather    Diabetes Paternal Grandfather     PHYSICAL EXAM: Vitals:   01/04/23 1059  BP: 111/84  Pulse: (!) 102  SpO2: 97%    GENERAL: Morbidly obese male using walker HEENT: Negative for arcus senilis or xanthelasma. There is no scleral icterus.  The mucous membranes are pink and moist.   NECK: Supple, No masses. Normal carotid upstrokes without bruits. No masses or thyromegaly.    CHEST: There are no chest wall deformities. There is no chest wall tenderness. Respirations are unlabored.  Lungs- decrease  b/l CARDIAC:  JVP: Difficult to assess due to body habitus however does not appear elevated         Normal S1, S2  Normal rate with regular rhythm. No murmurs, rubs or gallops.  Pulses are 2+ and symmetrical in upper and lower extremities. 1+ edema.  ABDOMEN: Soft, non-tender, non-distended. There are no masses or hepatomegaly. There are normal bowel sounds.  EXTREMITIES: Warm and well-perfused with 1+ pitting edema LYMPHATIC: No axillary or supraclavicular lymphadenopathy.  NEUROLOGIC: Patient is oriented x3 with no focal or lateralizing neurologic deficits.  PSYCH: Patients affect is appropriate, there is no evidence of anxiety or depression.  SKIN: Warm and dry  DATA REVIEW  ECG: 08/12/22: sinus tachycardia as per my personal interpretation  ECHO: 11/04/22: LVEF 20-25% with global hypokinesis  as per my personal interpretation 10/10/18 ([facility]): LVEF 20-25%, RV with mildly reduced function.   Based on [facility] records he had a stress MRI in 2011 with an LVEF of 35%.   CATH: 09/19/20:  Severely depressed overall left ventricular function EF less than 25% Severely enlarged left ventricular chamber Left main large free of disease LAD was large and free of disease Circumflex was large and free of disease left dominant Right heart cath showed no evidence of pulmonary hypertension Mean PA was 23 mean wedge of  10 Cardiac output of 7.6 Fick   ASSESSMENT & PLAN:  Heart failure with reduced EF Etiology of WU:[id number] cardiomyopathy; LHC in 2022 with no significant CAD. Stress CMR in 2011, however, I cannot find the results. Hx of bicep tendon rupture; unable to obtain cardiac MRI, status post ICD placement.  Will plan for PYP scan and cardiac PET. NYHA class / AHA Stage:III, limited by sciatic and obesity Volume status & Diuretics:  Currently taking torsemide 40mg  in the AM. Mildly hypervolemic today, will increase to 60mg  for two days and monitor weights closely.  Vasodilators:continue entresto 24/26mg  BID Beta-Blocker: start toprol 25mg  XL at night.  FAO:ZHYQMVHQ spironolactone 25mg  daily Cardiometabolic:continue farxiga 10mg  daily Devices therapies & Valvulopathies: s/p ICD placement  (5/24) by Dr. Lalla Brothers; admitted during that time due to volume overload with frequent PVCs.  Advanced therapies:Not a candidate at this time due to obesity  3. Frequent PVCs -Frequent PVCs at the time of ICD implant.  -Currently on PO amiodarone, will repeat labs today  -Will defer down-titration to EP at this time.   2. Morbid obesity - Body mass index is 52.75 kg/m. -Continued discussion regarding weight loss today. - Refer to weight loss clinic today.   Marc [name] Advanced Heart Failure Mechanical Circulatory Support

## 2023-01-04 NOTE — Patient Instructions (Addendum)
START Toprol XL 25mg  daily at bedtime  INCREASE Torsemide to 60mg  for two days  Routine lab work today. Will notify you of abnormal results  Your provider requests you have a pyp scan and pet  scan (We will call you to schedule these appointments)  We have referred you to the weight loss clinic (They will call you to schedule your appointment)  Follow up in 2 months   Do the following things EVERYDAY: Weigh yourself in the morning before breakfast. Write it down and keep it in a log. Take your medicines as prescribed Eat low salt foods--Limit salt (sodium) to 2000 mg per day.  Stay as active as you can everyday Limit all fluids for the day to less than 2 liters

## 2023-01-07 ENCOUNTER — Telehealth: Payer: Self-pay

## 2023-01-07 ENCOUNTER — Telehealth: Payer: Self-pay | Admitting: Cardiology

## 2023-01-07 MED ORDER — AMIODARONE HCL 400 MG PO TABS: 400.0000 mg | ORAL_TABLET | Freq: Every day | ORAL | 2 refills | Status: DC

## 2023-01-07 NOTE — Telephone Encounter (Signed)
*  STAT* If patient is at the pharmacy, call can be transferred to refill team.   1. Which medications need to be refilled? (please list name of each medication and dose if known)  amiodarone (PACERONE) 400 MG tablet   2. Which pharmacy/location (including street and city if local pharmacy) is medication to be sent to? [facility] - [place] - [address]    3. Do they need a 30 day or 90 day supply? 30 day    Ran out of medication this morning.

## 2023-01-07 NOTE — Telephone Encounter (Signed)
This is a [place] pt. Please address 

## 2023-01-07 NOTE — Telephone Encounter (Signed)
Pt notified.  Pt had no further questions.

## 2023-01-07 NOTE — Telephone Encounter (Signed)
Requested Prescriptions   Signed Prescriptions Disp Refills   amiodarone (PACERONE) 400 MG tablet 30 tablet 2    Sig: Take 1 tablet (400 mg total) by mouth daily.    Authorizing Provider: Sherie Don    Ordering User: Thayer Headings, Aarion Kittrell L

## 2023-01-07 NOTE — Telephone Encounter (Signed)
Patient of Dr. Gasper Lloyd. Please review for refill. Thank you!

## 2023-01-11 ENCOUNTER — Other Ambulatory Visit (HOSPITAL_COMMUNITY): Payer: Self-pay

## 2023-01-15 NOTE — Progress Notes (Signed)
  Care Coordination Note  01/15/2023 Name: Marc [name] MRN: [id number] DOB: [birth year]  Marc [name] is a 47 y.o. year old male who is a primary care patient of [name], NP and is actively engaged with the care management team. I reached out to Marcia L Lenzo by phone today to assist with re-scheduling a follow up visit with the RN Case Manager  Follow up plan: Telephone appointment with care management team member scheduled for: 01/27/2023  Burman Nieves, [facility] Care Coordination Care Guide Direct Dial: [phone]

## 2023-01-25 ENCOUNTER — Ambulatory Visit: Payer: Medicare Other | Admitting: Orthopedic Surgery

## 2023-01-27 ENCOUNTER — Encounter: Payer: Self-pay | Admitting: *Deleted

## 2023-02-01 ENCOUNTER — Ambulatory Visit: Payer: Self-pay | Admitting: *Deleted

## 2023-02-01 NOTE — Patient Outreach (Signed)
  Care Coordination   Initial Visit Note   02/01/2023 Name: Marc [name] MRN: [id number] DOB: January 27, 1976  Marc [name] is a 47 y.o. year old male who sees [name], NP for primary care. I spoke with  Marc [name] by phone today.  What matters to the patients health and wellness today?  State he is currently leaving a funeral, request call back tomorrow.      SDOH assessments and interventions completed:  No     Care Coordination Interventions:  No, not indicated   Follow up plan: Follow up call scheduled for 6/18    Encounter Outcome:  Pt. Request to Call Back   Kemper Durie, RN, MSN, [facility] Care Management Care Management Coordinator [phone]

## 2023-02-02 ENCOUNTER — Telehealth: Payer: Self-pay | Admitting: *Deleted

## 2023-02-02 NOTE — Patient Outreach (Signed)
  Care Coordination   02/02/2023 Name: Marc [name] MRN: [id number] DOB: Dec 22, 1975   Care Coordination Outreach Attempts:  An unsuccessful telephone outreach was attempted today to offer the patient information about available care coordination services.  Follow Up Plan:  Additional outreach attempts will be made to offer the patient care coordination information and services.   Encounter Outcome:  No Answer   Care Coordination Interventions:  Yes, provided    Kemper Durie, RN, MSN, [facility] Care Management Care Management Coordinator [phone]

## 2023-02-03 ENCOUNTER — Other Ambulatory Visit: Payer: Self-pay | Admitting: Orthopedic Surgery

## 2023-02-03 DIAGNOSIS — M5416 Radiculopathy, lumbar region: Secondary | ICD-10-CM

## 2023-02-03 DIAGNOSIS — M47816 Spondylosis without myelopathy or radiculopathy, lumbar region: Secondary | ICD-10-CM

## 2023-02-03 DIAGNOSIS — M48061 Spinal stenosis, lumbar region without neurogenic claudication: Secondary | ICD-10-CM

## 2023-02-03 NOTE — Telephone Encounter (Signed)
Flexeril refill requested.   Last seen by me on 12/07/22. Has not seen pain management and f/u with me was cancelled.   I will give limited refill, but he needs to get further refills from his PCP. Please let him know.

## 2023-02-04 NOTE — Telephone Encounter (Signed)
Left message to call the office.

## 2023-03-08 ENCOUNTER — Encounter: Payer: Medicare Other | Admitting: Cardiology

## 2023-03-10 ENCOUNTER — Telehealth: Payer: Self-pay

## 2023-03-11 ENCOUNTER — Other Ambulatory Visit: Payer: Self-pay

## 2023-03-11 ENCOUNTER — Encounter: Payer: Self-pay | Admitting: Cardiology

## 2023-03-11 ENCOUNTER — Other Ambulatory Visit
Admission: RE | Admit: 2023-03-11 | Discharge: 2023-03-11 | Disposition: A | Discharge status: Home / Self Care | Payer: Medicare Other | Source: Ambulatory Visit | Attending: Cardiology | Admitting: Cardiology

## 2023-03-11 ENCOUNTER — Ambulatory Visit (HOSPITAL_BASED_OUTPATIENT_CLINIC_OR_DEPARTMENT_OTHER): Payer: Medicare Other | Admitting: Cardiology

## 2023-03-11 VITALS — BP 97/71 | HR 94 | Resp 16 | Wt 358.5 lb

## 2023-03-11 DIAGNOSIS — G4733 Obstructive sleep apnea (adult) (pediatric): Secondary | ICD-10-CM

## 2023-03-11 DIAGNOSIS — Z6841 Body Mass Index (BMI) 40.0 and over, adult: Secondary | ICD-10-CM

## 2023-03-11 DIAGNOSIS — I5022 Chronic systolic (congestive) heart failure: Secondary | ICD-10-CM

## 2023-03-11 DIAGNOSIS — I5032 Chronic diastolic (congestive) heart failure: Secondary | ICD-10-CM | POA: Diagnosis present

## 2023-03-11 LAB — BASIC METABOLIC PANEL
Anion gap: 9 (ref 5–15)
BUN: 16 mg/dL (ref 6–20)
CO2: 25 mmol/L (ref 22–32)
Calcium: 9 mg/dL (ref 8.9–10.3)
Chloride: 101 mmol/L (ref 98–111)
Creatinine, Ser: 1.23 mg/dL (ref 0.61–1.24)
GFR, Estimated: >60 mL/min (ref >60)
Glucose, Bld: 97 mg/dL (ref 70–99)
Potassium: 4.3 mmol/L (ref 3.5–5.1)
Sodium: 135 mmol/L (ref 135–145)

## 2023-03-11 LAB — BRAIN NATRIURETIC PEPTIDE: B Natriuretic Peptide: 102.4 pg/mL — ABNORMAL HIGH (ref 0.0–100.0)

## 2023-03-11 LAB — CBC
HCT: 41.4 % (ref 39.0–52.0)
Hemoglobin: 12.8 g/dL — ABNORMAL LOW (ref 13.0–17.0)
MCH: 22 pg — ABNORMAL LOW (ref 26.0–34.0)
MCHC: 30.9 g/dL (ref 30.0–36.0)
MCV: 71 fL — ABNORMAL LOW (ref 80.0–100.0)
Platelets: 314 10*3/uL (ref 150–400)
RBC: 5.83 MIL/uL — ABNORMAL HIGH (ref 4.22–5.81)
RDW: 18.6 % — ABNORMAL HIGH (ref 11.5–15.5)
WBC: 7.4 10*3/uL (ref 4.0–10.5)
nRBC: 0 % (ref 0.0–0.2)

## 2023-03-11 MED ORDER — DIGOXIN 125 MCG PO TABS: 0.1250 mg | ORAL_TABLET | Freq: Every day | ORAL | 3 refills | Status: DC

## 2023-03-11 NOTE — Patient Instructions (Addendum)
Medication Changes:  START Digoxin 0.125 MG Daily Take an extra torsemide tablet today and tomorrow then resume normal dose.  Lab Work:  Labs done today, your results will be available in MyChart, we will contact you for abnormal readings.   Testing/Procedures:  You have been ordered a pulmonary function test, separate instructions given.  Your physician has requested that you have an echocardiogram. Echocardiography is a painless test that uses sound waves to create images of your heart. It provides your doctor with information about the size and shape of your heart and how well your heart's chambers and valves are working. This procedure takes approximately one hour. There are no restrictions for this procedure. Please do NOT wear cologne, perfume, aftershave, or lotions (deodorant is allowed). Please arrive 15 minutes prior to your appointment time.     You are scheduled for a Cardiac Catheterization on Tuesday, July 30 with Dr.  Dorthula Nettles .  1. Please arrive at the [facility] (Main Entrance A) at [facility]: [address] [place], [place] [address] at 5:30 AM (This time is 2 hour(s) before your procedure to ensure your preparation). Free valet parking service is available. You will check in at ADMITTING. The support person will be asked to wait in the waiting room.  It is OK to have someone drop you off and come back when you are ready to be discharged.    Special note: Every effort is made to have your procedure done on time. Please understand that emergencies sometimes delay scheduled procedures.  2. Diet: Do not eat solid foods after midnight.  The patient may have clear liquids until 5am upon the day of the procedure.  3. Labs: Done today  4. Medication instructions in preparation for your procedure:  STOP Taking torsemide, spironolactone the day of the procedure.  STOP taking farxiga Saturday, 7/27 (Three days before procedure.)     5. Plan to go home  the same day, you will only stay overnight if medically necessary. 6. Bring a current list of your medications and current insurance cards. 7. You MUST have a responsible person to drive you home. 8. Someone MUST be with you the first 24 hours after you arrive home or your discharge will be delayed. 9. Please wear clothes that are easy to get on and off and wear slip-on shoes.  Thank you for allowing [place] to care for you!   -- [facility]    Special Instructions // Education:  Do the following things EVERYDAY: Weigh yourself in the morning before breakfast. Write it down and keep it in a log. Take your medicines as prescribed Eat low salt foods--Limit salt (sodium) to 2000 mg per day.  Stay as active as you can everyday Limit all fluids for the day to less than 2 liters   Follow-Up in: 1 month    If you have any questions or concerns before your next appointment please send [place] a message through mychart or call our office at [phone] Monday-Friday 8 am-5 pm.   If you have an urgent need after hours on the weekend please call your Primary Cardiologist or the [facility] in [place] at [phone].

## 2023-03-11 NOTE — Progress Notes (Signed)
ADVANCED HEART FAILURE CLINIC NOTE  Referring Physician: [name], NP  Primary Care: [name], NP  HPI: BRYCE CHEEVER is a 47 y.o. male with HFrEF 2/2 NICM, OSA on CPAP, hx of tobacco use, morbid obesity presenting today to establish care. [name] reports first being diagnosed with systolic heart failure around 1610 at [facility]. He was diagnosed with the flu around that time followed by persistent SOB and diagnosis of nonischemic cardiomyopathy. 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 He was followed for several years at [facility] with LVEF ranging from 25-35%. He most recently presented to the [facility] ER in December 2023 with complaints of chest discomfort. At that time he was referred to HF clinic for further evaluation.  He had an echocardiogram in June 2023 demonstrating an LVEF of 25% and a repeat echo today w/ persistently reduced function.  He is now s/p ICD implant in 12/2022. Briefly required admission afterwards due to volume overload.   Interval hx:  - Mr. Meditz continues to feel very limited due to fatigue and dyspnea. He spends much of his time outside planting trees, however, has to stop more frequently now due to dyspnea and generalized weakness. He believe his HF is starting to lead to poor quality of life. He's becoming increasingly dependent on his wife/children now.   Activity level/exercise tolerance:  NYHA III; also limited by underlying sciatica.  Orthopnea:  Sleeps on 3 pillows Paroxysmal noctural dyspnea:  No Chest pain/pressure:  No Orthostatic lightheadedness:  No Palpitations:  No Lower extremity edema:  No Presyncope/syncope:  No Cough:  No  Past Medical History:  Diagnosis Date   CHF (congestive heart failure) (HCC)    Hypertension     Current Outpatient Medications  Medication Sig Dispense Refill   albuterol (VENTOLIN HFA) 108 (90 Base) MCG/ACT inhaler Inhale 2 puffs into  the lungs every 6 (six) hours as needed for wheezing or shortness of breath. 18 g 0   amiodarone (PACERONE) 400 MG tablet Take 1 tablet (400 mg total) by mouth daily. 30 tablet 2   aspirin EC 81 MG tablet Take 81 mg by mouth daily.     cyclobenzaprine (FLEXERIL) 10 MG tablet TAKE 1 TABLET BY MOUTH THREE TIMES DAILY AS NEEDED FOR  MUSCLE  SPASMS.  THIS  MEDICATION  CAN  MAKE  YOU  SLEEPY. 45 tablet 0   dapagliflozin propanediol (FARXIGA) 10 MG TABS tablet Take 1 tablet (10 mg total) by mouth daily before breakfast. 90 tablet 3   docusate sodium (COLACE) 100 MG capsule Take 100 mg by mouth daily as needed for mild constipation.     hydrocortisone (ANUSOL-HC) 2.5 % rectal cream Place 1 Application rectally 2 (two) times daily. 30 g 0   metoprolol succinate (TOPROL XL) 25 MG 24 hr tablet Take 1 tablet (25 mg total) by mouth at bedtime. 90 tablet 3   omeprazole (PRILOSEC) 20 MG capsule Take 20 mg by mouth daily.     sacubitril-valsartan (ENTRESTO) 24-26 MG Take 1 tablet by mouth 2 (two) times daily. 180 tablet 3   spironolactone (ALDACTONE) 25 MG tablet TAKE 1 TABLET BY MOUTH DAILY 100 tablet 2   torsemide (DEMADEX) 20 MG tablet Take 2 tablets (40 mg total) by mouth daily. Extra torsenmide 40 in pm for 3 pound weight gain     No current facility-administered medications for this visit.    Allergies  Allergen Reactions   Geodon [Ziprasidone]  Other (See Comments)    paralysis   Tramadol Other (See Comments)    Pt stated that it gave him sores.   Amoxicillin Rash   Zithromax [Azithromycin] Rash      Social History   Socioeconomic History   Marital status: Married    Spouse name: Not on file   Number of children: Not on file   Years of education: Not on file   Highest education level: Not on file  Occupational History   Not on file  Tobacco Use   Smoking status: Every Day    Current packs/day: 0.50    Average packs/day: 0.5 packs/day for 25.0 years (12.5 ttl pk-yrs)    Types:  Cigarettes   Smokeless tobacco: Never  Vaping Use   Vaping status: Never Used  Substance and Sexual Activity   Alcohol use: Not Currently   Drug use: Not Currently   Sexual activity: Yes  Other Topics Concern   Not on file  Social History Narrative   Wife, Matt Holmes, at bedside.   Social Determinants of Health   Financial Resource Strain: Low Risk  (05/26/2022)   Overall Financial Resource Strain (CARDIA)    Difficulty of Paying Living Expenses: Not hard at all  Food Insecurity: No Food Insecurity (12/21/2022)   Hunger Vital Sign    Worried About Running Out of Food in the Last Year: Never true    Ran Out of Food in the Last Year: Never true  Transportation Needs: No Transportation Needs (12/21/2022)   PRAPARE - Administrator, Civil Service (Medical): No    Lack of Transportation (Non-Medical): No  Physical Activity: Inactive (05/26/2022)   Exercise Vital Sign    Days of Exercise per Week: 0 days    Minutes of Exercise per Session: 0 min  Stress: Stress Concern Present (05/26/2022)   [employer] of Occupational Health - Occupational Stress Questionnaire    Feeling of Stress : To some extent  Social Connections: Moderately Integrated (05/26/2022)   Social Connection and Isolation Panel [NHANES]    Frequency of Communication with Friends and Family: Once a week    Frequency of Social Gatherings with Friends and Family: Twice a week    Attends Religious Services: More than 4 times per year    Active Member of [employer] or Organizations: No    Attends Banker Meetings: Never    Marital Status: Married  Catering manager Violence: Not At Risk (05/26/2022)   Humiliation, Afraid, Rape, and Kick questionnaire    Fear of Current or Ex-Partner: No    Emotionally Abused: No    Physically Abused: No    Sexually Abused: No      Family History  Problem Relation Age of Onset   Stroke Mother    Dementia Mother    Hypertension Father    Diabetes Father     Hypertension Paternal Grandfather    Diabetes Paternal Grandfather     PHYSICAL EXAM: Vitals:   03/11/23 1022  BP: 97/71  Pulse: 94  Resp: 16  SpO2: 100%   GENERAL: Well nourished, well developed, and in no apparent distress at rest.  HEENT: Negative for arcus senilis or xanthelasma. There is no scleral icterus.  The mucous membranes are pink and moist.   NECK: Supple, No masses. Normal carotid upstrokes without bruits. No masses or thyromegaly.    CHEST: There are no chest wall deformities. There is no chest wall tenderness. Respirations are unlabored.  Lungs- CTA B/L CARDIAC:  JVP: unable to visualize due to body habitus          Normal rate with regular rhythm. No murmurs, rubs or gallops.  Pulses are 2+ and symmetrical in upper and lower extremities. 1+ edema.  ABDOMEN: Soft, non-tender, non-distended. There are no masses or hepatomegaly. There are normal bowel sounds.  EXTREMITIES: Warm and well perfused with no cyanosis, clubbing.  LYMPHATIC: No axillary or supraclavicular lymphadenopathy.  NEUROLOGIC: Patient is oriented x3 with no focal or lateralizing neurologic deficits.  PSYCH: Patients affect is appropriate, there is no evidence of anxiety or depression.  SKIN: Warm and dry; no lesions or wounds.    DATA REVIEW  ECG: 08/12/22: sinus tachycardia as per my personal interpretation  ECHO: 11/04/22: LVEF 20-25% with global hypokinesis  as per my personal interpretation 10/10/18 ([facility]): LVEF 20-25%, RV with mildly reduced function.   Based on [facility] records he had a stress MRI in 2011 with an LVEF of 35%.   CATH: 09/19/20:  Severely depressed overall left ventricular function EF less than 25% Severely enlarged left ventricular chamber Left main large free of disease LAD was large and free of disease Circumflex was large and free of disease left dominant Right heart cath showed no evidence of pulmonary hypertension Mean PA was 23 mean wedge of 10 Cardiac output of 7.6  Fick   ASSESSMENT & PLAN:  Heart failure with reduced EF Etiology of HK:[id number] cardiomyopathy; LHC in 2022 with no significant CAD. Stress CMR in 2011, however, I cannot find the results. Hx of bicep tendon rupture; unable to obtain cardiac MRI, status post ICD placement.  PYP/PET still pending.  NYHA class / AHA Stage:III, I believe his heart failure is progressing.  Volume status & Diuretics:  Currently taking torsemide 40mg  in the AM. Mildly hypervolemic today, will increase to 60mg  for two days and monitor weights closely.  Vasodilators:continue entresto 24/26mg  BID. Repeat labs today. Uptitration limited by low SBP.  Beta-Blocker: continue toprol 25mg  XL at night.  IEP:PIRJJOAC spironolactone 25mg  daily Cardiometabolic:continue farxiga 10mg  daily Devices therapies & Valvulopathies: s/p ICD placement  (5/24) by Dr. Lalla Brothers; admitted during that time due to volume overload with frequent PVCs.  Advanced therapies: I believe he is having a continued decline in exercise capacity due to underlying HF. TTE from several months ago with dilated LV and normal RV function. He has excellent social support and decent insight if advanced therapies were required. Unfortunately, his BMI is too high for transplant at this time. Will refer him to pharmacy clinic to start ozempic. I have explained the importance of weight loss to him today extensively. Will schedule RHC to assess filling pressures / cardiac output. Otherwise, he has been smoking tobacco. Will plan on stopping now. If we can lower his BMI to the 45 range could pursue BTT LVAD if required.   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  -Currently on PO amiodarone, will repeat labs today  -Will defer down-titration to EP at this time.   3. Morbid obesity - Body mass index is 52.94 kg/m. -Continued discussion regarding weight loss today. -Refer to start ozempic today.    Rosalina Dingwall Advanced Heart  Failure Mechanical Circulatory Support

## 2023-03-11 NOTE — H&P (View-Only) (Signed)
ADVANCED HEART FAILURE CLINIC NOTE  Referring Physician: [name], NP  Primary Care: [name], NP  HPI: Marc [name] is a 47 y.o. male with HFrEF 2/2 NICM, OSA on CPAP, hx of tobacco use, morbid obesity presenting today to establish care. Marc [name] reports first being diagnosed with systolic heart failure around 4098 at [facility]. He was diagnosed with the flu around that time followed by persistent SOB and diagnosis of nonischemic cardiomyopathy. 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 He was followed for several years at [facility] with LVEF ranging from 25-35%. He most recently presented to the [facility] ER in December 2023 with complaints of chest discomfort. At that time he was referred to HF clinic for further evaluation.  He had an echocardiogram in June 2023 demonstrating an LVEF of 25% and a repeat echo today w/ persistently reduced function.  He is now s/p ICD implant in 12/2022. Briefly required admission afterwards due to volume overload.   Interval hx:  - Mr. Stepanski continues to feel very limited due to fatigue and dyspnea. He spends much of his time outside planting trees, however, has to stop more frequently now due to dyspnea and generalized weakness. He believe his HF is starting to lead to poor quality of life. He's becoming increasingly dependent on his wife/children now.   Activity level/exercise tolerance:  NYHA III; also limited by underlying sciatica.  Orthopnea:  Sleeps on 3 pillows Paroxysmal noctural dyspnea:  No Chest pain/pressure:  No Orthostatic lightheadedness:  No Palpitations:  No Lower extremity edema:  No Presyncope/syncope:  No Cough:  No  Past Medical History:  Diagnosis Date   CHF (congestive heart failure) (HCC)    Hypertension     Current Outpatient Medications  Medication Sig Dispense Refill   albuterol (VENTOLIN HFA) 108 (90 Base) MCG/ACT inhaler Inhale 2 puffs into  the lungs every 6 (six) hours as needed for wheezing or shortness of breath. 18 g 0   amiodarone (PACERONE) 400 MG tablet Take 1 tablet (400 mg total) by mouth daily. 30 tablet 2   aspirin EC 81 MG tablet Take 81 mg by mouth daily.     cyclobenzaprine (FLEXERIL) 10 MG tablet TAKE 1 TABLET BY MOUTH THREE TIMES DAILY AS NEEDED FOR  MUSCLE  SPASMS.  THIS  MEDICATION  CAN  MAKE  YOU  SLEEPY. 45 tablet 0   dapagliflozin propanediol (FARXIGA) 10 MG TABS tablet Take 1 tablet (10 mg total) by mouth daily before breakfast. 90 tablet 3   docusate sodium (COLACE) 100 MG capsule Take 100 mg by mouth daily as needed for mild constipation.     hydrocortisone (ANUSOL-HC) 2.5 % rectal cream Place 1 Application rectally 2 (two) times daily. 30 g 0   metoprolol succinate (TOPROL XL) 25 MG 24 hr tablet Take 1 tablet (25 mg total) by mouth at bedtime. 90 tablet 3   omeprazole (PRILOSEC) 20 MG capsule Take 20 mg by mouth daily.     sacubitril-valsartan (ENTRESTO) 24-26 MG Take 1 tablet by mouth 2 (two) times daily. 180 tablet 3   spironolactone (ALDACTONE) 25 MG tablet TAKE 1 TABLET BY MOUTH DAILY 100 tablet 2   torsemide (DEMADEX) 20 MG tablet Take 2 tablets (40 mg total) by mouth daily. Extra torsenmide 40 in pm for 3 pound weight gain     No current facility-administered medications for this visit.    Allergies  Allergen Reactions   Geodon [Ziprasidone]  Other (See Comments)    paralysis   Tramadol Other (See Comments)    Pt stated that it gave him sores.   Amoxicillin Rash   Zithromax [Azithromycin] Rash      Social History   Socioeconomic History   Marital status: Married    Spouse name: Not on file   Number of children: Not on file   Years of education: Not on file   Highest education level: Not on file  Occupational History   Not on file  Tobacco Use   Smoking status: Every Day    Current packs/day: 0.50    Average packs/day: 0.5 packs/day for 25.0 years (12.5 ttl pk-yrs)    Types:  Cigarettes   Smokeless tobacco: Never  Vaping Use   Vaping status: Never Used  Substance and Sexual Activity   Alcohol use: Not Currently   Drug use: Not Currently   Sexual activity: Yes  Other Topics Concern   Not on file  Social History Narrative   Wife, Marc [name], at bedside.   Social Determinants of Health   Financial Resource Strain: Low Risk  (05/26/2022)   Overall Financial Resource Strain (CARDIA)    Difficulty of Paying Living Expenses: Not hard at all  Food Insecurity: No Food Insecurity (12/21/2022)   Hunger Vital Sign    Worried About Running Out of Food in the Last Year: Never true    Ran Out of Food in the Last Year: Never true  Transportation Needs: No Transportation Needs (12/21/2022)   PRAPARE - Administrator, Civil Service (Medical): No    Lack of Transportation (Non-Medical): No  Physical Activity: Inactive (05/26/2022)   Exercise Vital Sign    Days of Exercise per Week: 0 days    Minutes of Exercise per Session: 0 min  Stress: Stress Concern Present (05/26/2022)   [employer] of Occupational Health - Occupational Stress Questionnaire    Feeling of Stress : To some extent  Social Connections: Moderately Integrated (05/26/2022)   Social Connection and Isolation Panel [NHANES]    Frequency of Communication with Friends and Family: Once a week    Frequency of Social Gatherings with Friends and Family: Twice a week    Attends Religious Services: More than 4 times per year    Active Member of [employer] or Organizations: No    Attends Banker Meetings: Never    Marital Status: Married  Catering manager Violence: Not At Risk (05/26/2022)   Humiliation, Afraid, Rape, and Kick questionnaire    Fear of Current or Ex-Partner: No    Emotionally Abused: No    Physically Abused: No    Sexually Abused: No      Family History  Problem Relation Age of Onset   Stroke Mother    Dementia Mother    Hypertension Father    Diabetes Father     Hypertension Paternal Grandfather    Diabetes Paternal Grandfather     PHYSICAL EXAM: Vitals:   03/11/23 1022  BP: 97/71  Pulse: 94  Resp: 16  SpO2: 100%   GENERAL: Well nourished, well developed, and in no apparent distress at rest.  HEENT: Negative for arcus senilis or xanthelasma. There is no scleral icterus.  The mucous membranes are pink and moist.   NECK: Supple, No masses. Normal carotid upstrokes without bruits. No masses or thyromegaly.    CHEST: There are no chest wall deformities. There is no chest wall tenderness. Respirations are unlabored.  Lungs- CTA B/L CARDIAC:  JVP: unable to visualize due to body habitus          Normal rate with regular rhythm. No murmurs, rubs or gallops.  Pulses are 2+ and symmetrical in upper and lower extremities. 1+ edema.  ABDOMEN: Soft, non-tender, non-distended. There are no masses or hepatomegaly. There are normal bowel sounds.  EXTREMITIES: Warm and well perfused with no cyanosis, clubbing.  LYMPHATIC: No axillary or supraclavicular lymphadenopathy.  NEUROLOGIC: Patient is oriented x3 with no focal or lateralizing neurologic deficits.  PSYCH: Patients affect is appropriate, there is no evidence of anxiety or depression.  SKIN: Warm and dry; no lesions or wounds.    DATA REVIEW  ECG: 08/12/22: sinus tachycardia as per my personal interpretation  ECHO: 11/04/22: LVEF 20-25% with global hypokinesis  as per my personal interpretation 10/10/18 ([facility]): LVEF 20-25%, RV with mildly reduced function.   Based on [facility] records he had a stress MRI in 2011 with an LVEF of 35%.   CATH: 09/19/20:  Severely depressed overall left ventricular function EF less than 25% Severely enlarged left ventricular chamber Left main large free of disease LAD was large and free of disease Circumflex was large and free of disease left dominant Right heart cath showed no evidence of pulmonary hypertension Mean PA was 23 mean wedge of 10 Cardiac output of 7.6  Fick   ASSESSMENT & PLAN:  Heart failure with reduced EF Etiology of WG:[id number] cardiomyopathy; LHC in 2022 with no significant CAD. Stress CMR in 2011, however, I cannot find the results. Hx of bicep tendon rupture; unable to obtain cardiac MRI, status post ICD placement.  PYP/PET still pending.  NYHA class / AHA Stage:III, I believe his heart failure is progressing.  Volume status & Diuretics:  Currently taking torsemide 40mg  in the AM. Mildly hypervolemic today, will increase to 60mg  for two days and monitor weights closely.  Vasodilators:continue entresto 24/26mg  BID. Repeat labs today. Uptitration limited by low SBP.  Beta-Blocker: continue toprol 25mg  XL at night.  QIO:[id number] spironolactone 25mg  daily Cardiometabolic:continue farxiga 10mg  daily Devices therapies & Valvulopathies: s/p ICD placement  (5/24) by Dr. Lalla Brothers; admitted during that time due to volume overload with frequent PVCs.  Advanced therapies: I believe he is having a continued decline in exercise capacity due to underlying HF. TTE from several months ago with dilated LV and normal RV function. He has excellent social support and decent insight if advanced therapies were required. Unfortunately, his BMI is too high for transplant at this time. Will refer him to pharmacy clinic to start ozempic. I have explained the importance of weight loss to him today extensively. Will schedule RHC to assess filling pressures / cardiac output. Otherwise, he has been smoking tobacco. Will plan on stopping now. If we can lower his BMI to the 45 range could pursue BTT LVAD if required.   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  -Currently on PO amiodarone, will repeat labs today  -Will defer down-titration to EP at this time.   3. Morbid obesity - Body mass index is 52.94 kg/m. -Continued discussion regarding weight loss today. -Refer to start ozempic today.    Kelsye Loomer Advanced Heart  Failure Mechanical Circulatory Support

## 2023-03-12 ENCOUNTER — Other Ambulatory Visit: Payer: Self-pay | Admitting: Orthopedic Surgery

## 2023-03-12 DIAGNOSIS — M48061 Spinal stenosis, lumbar region without neurogenic claudication: Secondary | ICD-10-CM

## 2023-03-12 DIAGNOSIS — M47816 Spondylosis without myelopathy or radiculopathy, lumbar region: Secondary | ICD-10-CM

## 2023-03-12 DIAGNOSIS — M5416 Radiculopathy, lumbar region: Secondary | ICD-10-CM

## 2023-03-12 NOTE — Telephone Encounter (Signed)
Please let him know refill of flexeril was sent to his pharmacy.

## 2023-03-12 NOTE — Telephone Encounter (Signed)
Notified pt. 

## 2023-03-16 ENCOUNTER — Telehealth: Payer: Self-pay | Admitting: Pharmacist

## 2023-03-16 ENCOUNTER — Ambulatory Visit (HOSPITAL_COMMUNITY)
Admission: RE | Admit: 2023-03-16 | Discharge: 2023-03-16 | Disposition: A | Discharge status: Home / Self Care | Payer: Medicare Other | Attending: Cardiology | Admitting: Cardiology

## 2023-03-16 ENCOUNTER — Other Ambulatory Visit (HOSPITAL_COMMUNITY): Payer: Self-pay

## 2023-03-16 ENCOUNTER — Encounter (HOSPITAL_COMMUNITY)
Admission: RE | Disposition: A | Discharge status: Home / Self Care | Payer: Self-pay | Source: Home / Self Care | Attending: Cardiology

## 2023-03-16 ENCOUNTER — Other Ambulatory Visit: Payer: Self-pay

## 2023-03-16 ENCOUNTER — Encounter (HOSPITAL_COMMUNITY): Payer: Self-pay

## 2023-03-16 DIAGNOSIS — I110 Hypertensive heart disease with heart failure: Secondary | ICD-10-CM | POA: Diagnosis not present

## 2023-03-16 DIAGNOSIS — Z79899 Other long term (current) drug therapy: Secondary | ICD-10-CM | POA: Diagnosis not present

## 2023-03-16 DIAGNOSIS — F17210 Nicotine dependence, cigarettes, uncomplicated: Secondary | ICD-10-CM | POA: Insufficient documentation

## 2023-03-16 DIAGNOSIS — I428 Other cardiomyopathies: Secondary | ICD-10-CM | POA: Insufficient documentation

## 2023-03-16 DIAGNOSIS — I493 Ventricular premature depolarization: Secondary | ICD-10-CM | POA: Diagnosis not present

## 2023-03-16 DIAGNOSIS — G4733 Obstructive sleep apnea (adult) (pediatric): Secondary | ICD-10-CM | POA: Diagnosis not present

## 2023-03-16 DIAGNOSIS — Z95810 Presence of automatic (implantable) cardiac defibrillator: Secondary | ICD-10-CM | POA: Insufficient documentation

## 2023-03-16 DIAGNOSIS — I509 Heart failure, unspecified: Secondary | ICD-10-CM | POA: Diagnosis not present

## 2023-03-16 DIAGNOSIS — I5020 Unspecified systolic (congestive) heart failure: Secondary | ICD-10-CM | POA: Insufficient documentation

## 2023-03-16 DIAGNOSIS — I5022 Chronic systolic (congestive) heart failure: Secondary | ICD-10-CM

## 2023-03-16 DIAGNOSIS — Z6841 Body Mass Index (BMI) 40.0 and over, adult: Secondary | ICD-10-CM | POA: Insufficient documentation

## 2023-03-16 HISTORY — PX: RIGHT HEART CATH: CATH118263

## 2023-03-16 LAB — POCT I-STAT EG7
Acid-Base Excess: 2 mmol/L (ref 0.0–2.0)
Acid-Base Excess: 2 mmol/L (ref 0.0–2.0)
Bicarbonate: 27.5 mmol/L (ref 20.0–28.0)
Bicarbonate: 27.9 mmol/L (ref 20.0–28.0)
Calcium, Ion: 1.25 mmol/L (ref 1.15–1.40)
Calcium, Ion: 1.27 mmol/L (ref 1.15–1.40)
HCT: 41 % (ref 39.0–52.0)
HCT: 41 % (ref 39.0–52.0)
Hemoglobin: 13.9 g/dL (ref 13.0–17.0)
Hemoglobin: 13.9 g/dL (ref 13.0–17.0)
O2 Saturation: 56 %
O2 Saturation: 58 %
Potassium: 4.2 mmol/L (ref 3.5–5.1)
Potassium: 4.2 mmol/L (ref 3.5–5.1)
Sodium: 135 mmol/L (ref 135–145)
Sodium: 136 mmol/L (ref 135–145)
TCO2: 29 mmol/L (ref 22–32)
TCO2: 29 mmol/L (ref 22–32)
pCO2, Ven: 47 mmHg (ref 44–60)
pCO2, Ven: 48.9 mmHg (ref 44–60)
pH, Ven: 7.365 (ref 7.25–7.43)
pH, Ven: 7.375 (ref 7.25–7.43)
pO2, Ven: 31 mmHg — CL (ref 32–45)
pO2, Ven: 31 mmHg — CL (ref 32–45)

## 2023-03-16 SURGERY — RIGHT HEART CATH
Anesthesia: LOCAL

## 2023-03-16 MED ORDER — LIDOCAINE HCL (PF) 1 % IJ SOLN
INTRAMUSCULAR | Status: AC
  Filled 2023-03-16: qty 30

## 2023-03-16 MED ORDER — LIDOCAINE HCL (PF) 1 % IJ SOLN
INTRAMUSCULAR | Status: DC | PRN
  Administered 2023-03-16: 2 mL

## 2023-03-16 MED ORDER — SODIUM CHLORIDE 0.9 % IV SOLN: INTRAVENOUS | Status: DC

## 2023-03-16 MED ORDER — MOUNJARO 2.5 MG/0.5ML ~~LOC~~ SOAJ: 2.5000 mg | SUBCUTANEOUS | 0 refills | Status: DC

## 2023-03-16 MED ORDER — HEPARIN (PORCINE) IN NACL 1000-0.9 UT/500ML-% IV SOLN
INTRAVENOUS | Status: DC | PRN
  Administered 2023-03-16 (×2): 500 mL

## 2023-03-16 SURGICAL SUPPLY — 8 items
CATH BALLN WEDGE 5F 110CM (CATHETERS) IMPLANT
CATH SWAN GANZ 7F STRAIGHT (CATHETERS) IMPLANT
GLIDESHEATH SLENDER 7FR .021G (SHEATH) IMPLANT
GUIDEWIRE .025 260CM (WIRE) IMPLANT
PACK CARDIAC CATHETERIZATION (CUSTOM PROCEDURE TRAY) ×1 IMPLANT
SET ATX-X65L (MISCELLANEOUS) IMPLANT
SHEATH GLIDE SLENDER 4/5FR (SHEATH) IMPLANT
TRANSDUCER W/STOPCOCK (MISCELLANEOUS) ×1 IMPLANT

## 2023-03-16 NOTE — Discharge Instructions (Addendum)

## 2023-03-16 NOTE — Interval H&P Note (Signed)
History and Physical Interval Note:  03/16/2023 7:41 AM  Marc [name]  has presented today for surgery, with the diagnosis of congestive heart failure.  The various methods of treatment have been discussed with the patient and family. After consideration of risks, benefits and other options for treatment, the patient has consented to  Procedure(s): RIGHT HEART CATH (N/A) as a surgical intervention.  The patient's history has been reviewed, patient examined, no change in status, stable for surgery.  I have reviewed the patient's chart and labs.  Questions were answered to the patient's satisfaction.     [name]

## 2023-03-16 NOTE — Progress Notes (Signed)
[facility] EDUCATION NOTE:                VAD evaluation consent reviewed and signed by patient Marc [name] and designated caregiver Marc [name].  Initial VAD teaching completed with pt and caregiver.   VAD educational packet including "Understanding Your Options with Advanced Heart Failure", "Marc [name] Patient Agreement for VAD Evaluation and Potential Implantation" consent, and Abbott "Heartmate 3 Left Ventricular Device (LVAD) Patient Guide", Heartmate 3 Left Ventricular Assist System Patient Education Program DVD", "[place] HM III Patient Education", "[place] Mechanical Circulatory Support Program", and "Decision Aids for Left Ventricular Assist Device" reviewed in detail and left at bedside for continued reference.   All questions answered regarding VAD implant, hospital stay, and what to expect when discharged home living with a heart pump. Pt identified his wife,Marc [name], as his primary caregiver. Explained need for 24/7 care when pt is discharged home due to sternal precautions, adaptation to living on support, emotional support, consistent and meticulous exit site care and management, medication adherence and high volume of follow up visits with the VAD Clinic after discharge; both pt and caregiver verbalized understanding of above.   Explained that LVAD can be implanted for two indications in the setting of advanced left ventricular heart failure treatment:  Bridge to transplant - used for patients who cannot safely wait for heart transplant without this device.  Or    Destination therapy - used for patients until end of life or recovery of heart function.  Patient and caregiver(s) acknowledge that the indication at this point in time for LVAD therapy would be for BTT due to BMI.   Provided brief equipment overview and demonstration with HeartMate III training loop including discussion on the following:   a) mobile power unit b) system controller   c) universal Musician   d) battery clips   e) Batteries   f)  Perc lock   g) Percutaneous lead   Demonstrated and discussed:  a) changing power source on system controller from tethered (MPU) to untethered (battery) mode   b) changing power source on system controller from untethered (battery) to tethered (MPU) mode   c) how to monitor battery life both on the system controller and on each individual battery   d) changing batteries   Reviewed and supplied a copy of home inspection check list stressing that only three pronged grounded power outlets can be used for VAD equipment. Patient confirmed home has electrical outlets that will support the equipment along with access working telephone.  Identified the following lifestyle modifications while living on MCS:    1. No driving for at least three months and then only if doctor gives permission to do so.   2. No tub baths while pump implanted, and shower only when doctor gives permission.   3. No swimming or submersion in water while implanted with pump.   4. No contact sports or engaging in jumping activities.   5. Always have a backup controller, charged spare batteries, and battery clips nearby at all times in case of emergency.   6. Call the doctor or hospital contact person if any change in how the pump sounds, feels, or works.   7. Plan to sleep only when connected to the power module.   8. Do not sleep on your stomach.   9. Keep a backup system controller, charged batteries, battery clips, and flashlight near you during sleep in case of electrical power outage.   10. Exit  site care including dressing changes, monitoring for infection, and importance of keeping percutaneous lead stabilized at all times.    Extended the option to have one of our current patients and caregiver(s) come to talk with them about living on support to assist with decision making. VAD Coordinator will arrange this at next visit in VAD Clinic.  Reviewed pictures of VAD  drive line, site care, dressing changes, and drive line stabilization including securement attachment device and abdominal binder. Discussed with pt and family that they will be required to purchase dressing supplies as long as patient has the VAD in place.   Reinforced need for 24 hour/7 day week caregivers; pt designated Marc Ulin as his caregiver. He will also need to abide by sternal precautions with no lifting >10lbs, pushing, pulling and will need assistance with adapting to new life style with VAD equipment and care.   Intermacs patient survival statistics through March 2024 reviewed with patient and caregiver as follows:    The patient understands that from this discussion it does not mean that they will receive the device, but that depends on an extensive evaluation process. The patient is aware of the fact that if at anytime they want to stop the evaluation process they can.  VAD Coordinator placed referral for pharmacy clinic for GLP-1 at request of Dr. Gasper [name].  All questions have been answered at this time and contact information was provided should they encounter any further questions. They are both agreeable at this time to the evaluation process and will move forward.    Marc Davies RN,BSN VAD Coordinator   Office: [phone] 24/7 VAD Pager: [phone]

## 2023-03-16 NOTE — Telephone Encounter (Signed)
Highest A1c 6.2%, however pt already on Farxiga at the time for his CHF so likely has type 2 diabetes if he were to stop his Marcelline Deist which is not clinically appropriate to do. Mounjaro and Ozempic both on formulary as tier 3 meds but require prior auth. Will submit for coverage and check on copay if approved. He does take other branded meds Marcelline Deist and [place]) so may already be in the donut hole, and GLPs do not have pt assistance options.   Prior auth submitted for [facility] due to better weight loss data than Ozempic and easier injecting device to use. Key [id number]

## 2023-03-16 NOTE — Telephone Encounter (Signed)
-----   Message from Nurse Marlaine Hind sent at 03/16/2023 11:24 AM EDT ----- Regarding: GLP-1 Referral Hey Dr. Gasper Lloyd wanted to refer this patient to the pharmacy clinic for Ozempic and I was hoping you could help [place]. His last A1C was 1 year ago at 6.2 but we can recollect next visit as well.

## 2023-03-16 NOTE — Telephone Encounter (Signed)
Prior auth approved through 08/17/23. Asked pharm tech to run copay, monthly cost is $47. Per MD, rx to be sent in now so that pt can start med sooner. Will need appt with PharmD to discuss med. Pt admitted for cath this AM. Spoke with pt, he is interested in trying Oasis. Aware to pick up med and store in fridge. Will bring first dose with him to PharmD appt scheduled next week.

## 2023-03-17 ENCOUNTER — Encounter (HOSPITAL_COMMUNITY): Payer: Self-pay | Admitting: Cardiology

## 2023-03-18 ENCOUNTER — Ambulatory Visit: Payer: Medicare Other

## 2023-03-18 DIAGNOSIS — I428 Other cardiomyopathies: Secondary | ICD-10-CM | POA: Diagnosis not present

## 2023-03-24 ENCOUNTER — Encounter: Payer: Medicare Other | Admitting: Cardiology

## 2023-03-25 ENCOUNTER — Telehealth: Payer: Self-pay | Admitting: Cardiology

## 2023-03-25 NOTE — Telephone Encounter (Signed)
Spoke to patient, patient can not drive due to swollen legs.  Discussed Mounjaro dose titration schedule potential GI related side effects and how to manage them.  Confirm patient does not have personal or family hx of medullary thyroid carcinoma (MTC),Multiple Endocrine Neoplasia syndrome type 2 (MEN 2), pancreatitis or gall stone.   Advised patient pt cut down on fat and carb intake to tolerate GLP1 better and to check FBG twice a week.  Patient is comfortable doing 1st injection at home will follow instructions that come in the box. If he has any question will call us.  Follow up to titrate the dose in 4 weeks

## 2023-03-25 NOTE — Telephone Encounter (Signed)
Pt had to cancel his appt on tomorrow with Aundra Millet due to him not being able to drive right now but he'd like a callback to see if she should still start the [facility] or not. He was supposed to bring them in the office with him tomorrow but he said he does have them in the refrigerator right now. Please advise

## 2023-03-26 ENCOUNTER — Ambulatory Visit: Payer: Medicare Other

## 2023-03-26 NOTE — Telephone Encounter (Signed)
Thank you :)

## 2023-03-30 NOTE — Progress Notes (Signed)
Remote ICD transmission.   

## 2023-03-31 ENCOUNTER — Encounter: Payer: Medicare Other | Admitting: Cardiology

## 2023-03-31 ENCOUNTER — Ambulatory Visit: Payer: Medicare Other

## 2023-04-01 ENCOUNTER — Ambulatory Visit: Payer: Medicare Other

## 2023-04-02 ENCOUNTER — Telehealth: Payer: Self-pay

## 2023-04-02 NOTE — Telephone Encounter (Signed)
Called pt in attempt to resched PFT & ECHO. No answer. Lvm with call back number.

## 2023-04-07 ENCOUNTER — Other Ambulatory Visit: Payer: Self-pay | Admitting: Cardiology

## 2023-04-08 NOTE — Telephone Encounter (Signed)
Could you advise on refill for Amiodarone?  Rx was last filled by Rosalita Chessman on 01/07/23.  Not seen by EP since last visit with Suzann on 12/29/22

## 2023-04-08 NOTE — Telephone Encounter (Signed)
Called pt and lvm to call and reschedule tomorrow's appt w/ Sabharwal. Pt hasn't had CT completed yet. Needs to reschedule CT and Sabharwal appt.

## 2023-04-08 NOTE — Telephone Encounter (Signed)
Please r/s pt appt with Dr. Lalla Brothers.  Thanks!

## 2023-04-12 ENCOUNTER — Ambulatory Visit (HOSPITAL_COMMUNITY): Payer: Medicare Other

## 2023-04-12 ENCOUNTER — Other Ambulatory Visit: Payer: Self-pay | Admitting: Cardiology

## 2023-04-12 ENCOUNTER — Ambulatory Visit (HOSPITAL_COMMUNITY): Admission: RE | Admit: 2023-04-12 | Payer: Medicare Other | Source: Ambulatory Visit

## 2023-04-12 ENCOUNTER — Ambulatory Visit: Payer: Medicare Other

## 2023-04-12 ENCOUNTER — Encounter (HOSPITAL_COMMUNITY): Payer: Medicare Other | Admitting: Cardiology

## 2023-04-12 NOTE — Telephone Encounter (Signed)
Pt missed PharmD appt for GLP initiation. He rescheduled for 9/5. Will defer refills until he is seen. Called pt, no answer.

## 2023-04-13 ENCOUNTER — Ambulatory Visit: Payer: Medicare Other

## 2023-04-15 ENCOUNTER — Ambulatory Visit: Payer: Medicare Other | Attending: Cardiology

## 2023-04-15 DIAGNOSIS — F17210 Nicotine dependence, cigarettes, uncomplicated: Secondary | ICD-10-CM | POA: Insufficient documentation

## 2023-04-15 DIAGNOSIS — I5022 Chronic systolic (congestive) heart failure: Secondary | ICD-10-CM | POA: Insufficient documentation

## 2023-04-15 DIAGNOSIS — R0609 Other forms of dyspnea: Secondary | ICD-10-CM | POA: Insufficient documentation

## 2023-04-15 LAB — PULMONARY FUNCTION TEST ARMC ONLY
DL/VA % pred: 84 %
DL/VA: 3.8 ml/min/mmHg/L
DLCO unc % pred: 58 %
DLCO unc: 16.73 ml/min/mmHg
FEF 25-75 Post: 2.38 L/s
FEF 25-75 Pre: 1.17 L/s
FEF2575-%Change-Post: 103 %
FEF2575-%Pred-Post: 68 %
FEF2575-%Pred-Pre: 33 %
FEV1-%Change-Post: 44 %
FEV1-%Pred-Post: 63 %
FEV1-%Pred-Pre: 44 %
FEV1-Post: 2.46 L
FEV1-Pre: 1.71 L
FEV1FVC-%Change-Post: 39 %
FEV1FVC-%Pred-Pre: 71 %
FEV6-%Change-Post: 14 %
FEV6-%Pred-Post: 65 %
FEV6-%Pred-Pre: 57 %
FEV6-Post: 3.15 L
FEV6-Pre: 2.75 L
FEV6FVC-%Pred-Post: 103 %
FEV6FVC-%Pred-Pre: 103 %
FVC-%Change-Post: 3 %
FVC-%Pred-Post: 63 %
FVC-%Pred-Pre: 61 %
FVC-Post: 3.15 L
FVC-Pre: 3.05 L
Post FEV1/FVC ratio: 78 %
Post FEV6/FVC ratio: 100 %
Pre FEV1/FVC ratio: 56 %
Pre FEV6/FVC Ratio: 100 %
RV % pred: 112 %
RV: 2.18 L
TLC % pred: 76 %
TLC: 5.18 L

## 2023-04-15 MED ORDER — ALBUTEROL SULFATE (2.5 MG/3ML) 0.083% IN NEBU
2.5000 mg | INHALATION_SOLUTION | Freq: Once | RESPIRATORY_TRACT | Status: AC
  Administered 2023-04-15: 2.5 mg via RESPIRATORY_TRACT
  Filled 2023-04-15: qty 3

## 2023-04-20 ENCOUNTER — Ambulatory Visit (HOSPITAL_BASED_OUTPATIENT_CLINIC_OR_DEPARTMENT_OTHER): Payer: Medicare Other | Admitting: Cardiology

## 2023-04-20 ENCOUNTER — Other Ambulatory Visit
Admission: RE | Admit: 2023-04-20 | Discharge: 2023-04-20 | Disposition: A | Discharge status: Home / Self Care | Payer: Medicare Other | Source: Ambulatory Visit | Attending: Cardiology | Admitting: Cardiology

## 2023-04-20 ENCOUNTER — Other Ambulatory Visit (HOSPITAL_COMMUNITY): Payer: Self-pay

## 2023-04-20 ENCOUNTER — Other Ambulatory Visit: Payer: Self-pay | Admitting: Orthopedic Surgery

## 2023-04-20 ENCOUNTER — Telehealth: Payer: Self-pay | Admitting: Pharmacist

## 2023-04-20 VITALS — BP 123/76 | HR 73 | Ht 69.0 in | Wt 358.0 lb

## 2023-04-20 DIAGNOSIS — Z6841 Body Mass Index (BMI) 40.0 and over, adult: Secondary | ICD-10-CM | POA: Insufficient documentation

## 2023-04-20 DIAGNOSIS — I428 Other cardiomyopathies: Secondary | ICD-10-CM

## 2023-04-20 DIAGNOSIS — J449 Chronic obstructive pulmonary disease, unspecified: Secondary | ICD-10-CM | POA: Insufficient documentation

## 2023-04-20 DIAGNOSIS — I493 Ventricular premature depolarization: Secondary | ICD-10-CM

## 2023-04-20 DIAGNOSIS — I5022 Chronic systolic (congestive) heart failure: Secondary | ICD-10-CM | POA: Insufficient documentation

## 2023-04-20 DIAGNOSIS — I10 Essential (primary) hypertension: Secondary | ICD-10-CM

## 2023-04-20 DIAGNOSIS — I110 Hypertensive heart disease with heart failure: Secondary | ICD-10-CM | POA: Diagnosis not present

## 2023-04-20 DIAGNOSIS — M5416 Radiculopathy, lumbar region: Secondary | ICD-10-CM

## 2023-04-20 DIAGNOSIS — M48061 Spinal stenosis, lumbar region without neurogenic claudication: Secondary | ICD-10-CM

## 2023-04-20 DIAGNOSIS — M47816 Spondylosis without myelopathy or radiculopathy, lumbar region: Secondary | ICD-10-CM

## 2023-04-20 LAB — BASIC METABOLIC PANEL
Anion gap: 10 (ref 5–15)
BUN: 18 mg/dL (ref 6–20)
CO2: 26 mmol/L (ref 22–32)
Calcium: 8.9 mg/dL (ref 8.9–10.3)
Chloride: 101 mmol/L (ref 98–111)
Creatinine, Ser: 1.17 mg/dL (ref 0.61–1.24)
GFR, Estimated: >60 mL/min (ref >60)
Glucose, Bld: 90 mg/dL (ref 70–99)
Potassium: 4.1 mmol/L (ref 3.5–5.1)
Sodium: 137 mmol/L (ref 135–145)

## 2023-04-20 LAB — BRAIN NATRIURETIC PEPTIDE: B Natriuretic Peptide: 47.7 pg/mL (ref 0.0–100.0)

## 2023-04-20 LAB — DIGOXIN LEVEL: Digoxin Level: 0.8 ng/mL (ref 0.8–2.0)

## 2023-04-20 MED ORDER — MOUNJARO 5 MG/0.5ML ~~LOC~~ SOAJ: 5.0000 mg | SUBCUTANEOUS | 0 refills | Status: DC

## 2023-04-20 MED ORDER — ENTRESTO 49-51 MG PO TABS: 1.0000 | ORAL_TABLET | Freq: Two times a day (BID) | ORAL | 1 refills | Status: DC

## 2023-04-20 MED ORDER — SACUBITRIL-VALSARTAN 49-51 MG PO TABS: 1.0000 | ORAL_TABLET | Freq: Two times a day (BID) | ORAL | 3 refills | Status: DC

## 2023-04-20 MED ORDER — ANORO ELLIPTA 62.5-25 MCG/ACT IN AEPB: 1.0000 | INHALATION_SPRAY | Freq: Every day | RESPIRATORY_TRACT | 1 refills | Status: DC

## 2023-04-20 MED ORDER — CYCLOBENZAPRINE HCL 10 MG PO TABS
10.0000 mg | ORAL_TABLET | Freq: Three times a day (TID) | ORAL | 0 refills | Status: DC | PRN
Start: 2023-04-20 — End: 2023-05-31

## 2023-04-20 NOTE — Telephone Encounter (Signed)
Asked to titrate Marc [name], and start inhaler treatment. Patient is tolerating Mounjaro well.   Plan: -Increase Mounjaro to 5 mg SQ once weekly -Increase Entresto to 49/51 mg BID -Start Anoro Ellipta 62.5/25 mcg while awaiting pulmonology appointment  Thank you for involving pharmacy in this patient's care.  Marc [name], PharmD, BCPS Phone - [phone] Clinical Pharmacist 04/20/2023 11:21 AM

## 2023-04-20 NOTE — Progress Notes (Signed)
ADVANCED HEART FAILURE CLINIC NOTE  Referring Physician: [name], NP  Primary Care: [name], NP  HPI: Marc [name] is a 47 y.o. male with HFrEF 2/2 NICM, OSA on CPAP, hx of tobacco use, morbid obesity presenting today to establish care. Marc [name] reports first being diagnosed with systolic heart failure around 8657 at [facility] He was diagnosed with the flu around that time followed by persistent SOB and diagnosis of nonischemic cardiomyopathy. 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 He was followed for several years at [facility] with LVEF ranging from 25-35%. He most recently presented to the [facility] ER in December 2023 with complaints of chest discomfort. At that time he was referred to HF clinic for further evaluation.  He had an echocardiogram in June 2023 demonstrating an LVEF of 25% and a repeat echo today w/ persistently reduced function.  He is now s/p ICD implant in 12/2022. Briefly required admission afterwards due to volume overload.   Interval hx:  - He continues to work around his land; he feels that he has more energy in the morning. He is able to wake up and feed his chicken, work on his grape vines etc; however, he is exhausted after 2-3hrs of work.  - Started on Mounjaro roughly 4 weeks ago; he has not noticed a significant change in weight or appetite at the lowest dose. He is however attempting to make dietary changes including carb reduction, etc.   Activity level/exercise tolerance:  NYHA III; also limited by underlying sciatica.  Orthopnea:  Sleeps on 3 pillows Paroxysmal noctural dyspnea:  No Chest pain/pressure:  No Orthostatic lightheadedness:  No Palpitations:  No Lower extremity edema:  No Presyncope/syncope:  No Cough:  No  Past Medical History:  Diagnosis Date   CHF (congestive heart failure) (HCC)    Hypertension     Current Outpatient Medications  Medication Sig Dispense  Refill   albuterol (VENTOLIN HFA) 108 (90 Base) MCG/ACT inhaler Inhale 2 puffs into the lungs every 6 (six) hours as needed for wheezing or shortness of breath. 18 g 0   amiodarone (PACERONE) 200 MG tablet Take 2 tablets (400 mg total) by mouth daily. PLEASE CALL OFFICE TO SCHEDULE APPOINTMENT PRIOR TO NEXT REFILL 180 tablet 0   aspirin EC 81 MG tablet Take 81 mg by mouth daily.     cyclobenzaprine (FLEXERIL) 10 MG tablet Take 1 tablet (10 mg total) by mouth every 8 (eight) hours as needed for muscle spasms. 30 tablet 0   dapagliflozin propanediol (FARXIGA) 10 MG TABS tablet Take 1 tablet (10 mg total) by mouth daily before breakfast. 90 tablet 3   digoxin (LANOXIN) 0.125 MG tablet Take 1 tablet (0.125 mg total) by mouth daily. 90 tablet 3   docusate sodium (COLACE) 100 MG capsule Take 100 mg by mouth daily as needed for mild constipation.     hydrocortisone (ANUSOL-HC) 2.5 % rectal cream Place 1 Application rectally 2 (two) times daily. (Patient taking differently: Place 1 Application rectally 2 (two) times daily as needed for anal itching or hemorrhoids.) 30 g 0   metoprolol succinate (TOPROL XL) 25 MG 24 hr tablet Take 1 tablet (25 mg total) by mouth at bedtime. 90 tablet 3   omeprazole (PRILOSEC) 20 MG capsule Take 20 mg by mouth daily.     sacubitril-valsartan (ENTRESTO) 24-26 MG Take 1 tablet by mouth 2 (two) times daily. 180 tablet 3   spironolactone (ALDACTONE) 25  MG tablet TAKE 1 TABLET BY MOUTH DAILY 100 tablet 2   tirzepatide (MOUNJARO) 2.5 MG/0.5ML Pen Inject 2.5 mg into the skin once a week. 2 mL 0   torsemide (DEMADEX) 20 MG tablet Take 2 tablets (40 mg total) by mouth daily. Extra torsenmide 40 in pm for 3 pound weight gain     No current facility-administered medications for this visit.    Allergies  Allergen Reactions   Geodon [Ziprasidone] Other (See Comments)    paralysis   Tramadol Other (See Comments)    Pt stated that it gave him sores.   Amoxicillin Rash   Zithromax  [Azithromycin] Rash      Social History   Socioeconomic History   Marital status: Married    Spouse name: Not on file   Number of children: Not on file   Years of education: Not on file   Highest education level: Not on file  Occupational History   Not on file  Tobacco Use   Smoking status: Every Day    Current packs/day: 0.50    Average packs/day: 0.5 packs/day for 25.0 years (12.5 ttl pk-yrs)    Types: Cigarettes   Smokeless tobacco: Never  Vaping Use   Vaping status: Never Used  Substance and Sexual Activity   Alcohol use: Not Currently   Drug use: Not Currently   Sexual activity: Yes  Other Topics Concern   Not on file  Social History Narrative   Wife, Matt Holmes, at bedside.   Social Determinants of Health   Financial Resource Strain: Low Risk  (05/26/2022)   Overall Financial Resource Strain (CARDIA)    Difficulty of Paying Living Expenses: Not hard at all  Food Insecurity: No Food Insecurity (12/21/2022)   Hunger Vital Sign    Worried About Running Out of Food in the Last Year: Never true    Ran Out of Food in the Last Year: Never true  Transportation Needs: No Transportation Needs (12/21/2022)   PRAPARE - Administrator, Civil Service (Medical): No    Lack of Transportation (Non-Medical): No  Physical Activity: Inactive (05/26/2022)   Exercise Vital Sign    Days of Exercise per Week: 0 days    Minutes of Exercise per Session: 0 min  Stress: Stress Concern Present (05/26/2022)   [employer] of Occupational Health - Occupational Stress Questionnaire    Feeling of Stress : To some extent  Social Connections: Moderately Integrated (05/26/2022)   Social Connection and Isolation Panel [NHANES]    Frequency of Communication with Friends and Family: Once a week    Frequency of Social Gatherings with Friends and Family: Twice a week    Attends Religious Services: More than 4 times per year    Active Member of [employer] or Organizations: No    Attends  Banker Meetings: Never    Marital Status: Married  Catering manager Violence: Not At Risk (05/26/2022)   Humiliation, Afraid, Rape, and Kick questionnaire    Fear of Current or Ex-Partner: No    Emotionally Abused: No    Physically Abused: No    Sexually Abused: No      Family History  Problem Relation Age of Onset   Stroke Mother    Dementia Mother    Hypertension Father    Diabetes Father    Hypertension Paternal Grandfather    Diabetes Paternal Grandfather     PHYSICAL EXAM: Vitals:   04/20/23 0958  BP: 123/76  Pulse: 73  SpO2: 98%  GENERAL: Well nourished, well developed, and in no apparent distress at rest.  HEENT: Negative for arcus senilis or xanthelasma. There is no scleral icterus.  The mucous membranes are pink and moist.   NECK: Supple, No masses. Normal carotid upstrokes without bruits. No masses or thyromegaly.    CHEST: There are no chest wall deformities. There is no chest wall tenderness. Respirations are unlabored.  Lungs- CTA B/L CARDIAC:  JVP: unable to visualize due to body habitus          Normal rate with regular rhythm. No murmurs, rubs or gallops.  Pulses are 2+ and symmetrical in upper and lower extremities. 1+ edema.  ABDOMEN: Soft, non-tender, non-distended. There are no masses or hepatomegaly. There are normal bowel sounds.  EXTREMITIES: Warm and well perfused with no cyanosis, clubbing.  LYMPHATIC: No axillary or supraclavicular lymphadenopathy.  NEUROLOGIC: Patient is oriented x3 with no focal or lateralizing neurologic deficits.  PSYCH: Patients affect is appropriate, there is no evidence of anxiety or depression.  SKIN: Warm and dry; no lesions or wounds.    DATA REVIEW  ECG: 08/12/22: sinus tachycardia as per my personal interpretation  ECHO: 11/04/22: LVEF 20-25% with global hypokinesis  as per my personal interpretation 10/10/18 ([facility]): LVEF 20-25%, RV with mildly reduced function.   Based on [facility] records he had a  stress MRI in 2011 with an LVEF of 35%.   CATH: 09/19/20:  Severely depressed overall left ventricular function EF less than 25% Severely enlarged left ventricular chamber Left main large free of disease LAD was large and free of disease Circumflex was large and free of disease left dominant Right heart cath showed no evidence of pulmonary hypertension Mean PA was 23 mean wedge of 10 Cardiac output of 7.6 Fick  RHC 03/16/23:  HEMODYNAMICS: RA:                  9 mmHg (mean) PA:                  64/26 mmHg (42 mean) PCWP:            25 with large V waves                                      Estimated Fick CO/CI   5 L/min, 1.9 L/min/m2                                                 TPG                 17  mmHg                                            PVR                 3.4 Wood Units  PAPi                4       IMPRESSION: Moderate to severely elevated pre and post capillary filling pressures.  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  Severely reduced cardiac output / index.  ASSESSMENT & PLAN:  Heart failure with reduced EF Etiology of JX:[id number] cardiomyopathy; LHC in 2022 with no significant CAD. Stress CMR in 2011, however, I cannot find the results. Hx of bicep tendon rupture; unable to obtain cardiac MRI, status post ICD placement.  PYP/PET still pending.  NYHA class / AHA Stage:III, I believe his heart failure is progressing.  Volume status & Diuretics:  Currently taking torsemide 40mg  in the AM. Euvolemic Vasodilators: increase Entresto to 49/51mg  BID; previously limited by low SBP.  Beta-Blocker: continue toprol 25mg  XL at night.  HYQ:MVHQIONG spironolactone 25mg  daily Cardiometabolic:continue farxiga 10mg  daily Devices therapies & Valvulopathies: s/p ICD placement  (5/24) by Dr. Lalla Brothers; admitted during that time due to volume overload with frequent PVCs.  Advanced therapies: I believe he is having a continued decline in  exercise capacity due to underlying HF. TTE from several months ago with dilated LV and normal RV function. He has excellent social support and decent insight if advanced therapies were required. Unfortunately, his BMI is too high for transplant at this time. Now started on Mounjaro. RHC w/ CI of 1.9. Undergoing LVAD/transplant evaluation. Ultimately, if LVEF remains low on CMR in October we will proceed with LVAD implant.   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  -Currently on PO amiodarone, will repeat labs today  -Will defer down-titration to EP at this time.   3. Morbid obesity - Body mass index is 52.87 kg/m. -Continued discussion regarding weight loss today. -Increase mounjoro to 0.5 dose today.   4.  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  - Refer to pulmonology today - Currently taking ventolin as needed. Will start LABA/LAMA today.    Concepcion Gillott Advanced Heart Failure Mechanical Circulatory Support

## 2023-04-20 NOTE — Patient Instructions (Signed)
INCREASE your ENTRESTO to 49-51 MG TWICE DAILY  START your Anoro Ellipta inhaler  INCREASE your [name]  We have also placed referral which you will see down below on your AVS

## 2023-04-20 NOTE — Telephone Encounter (Signed)
Last seen by me on 12/07/22. Referred to [facility] for possible injections. He has not seen him and he has cancelled his follow up with me.   Small refill of flexeril sent to pharmacy. Please let him know he will need to get additional refills from his PCP.

## 2023-04-20 NOTE — Telephone Encounter (Signed)
Left message on voicemail.

## 2023-04-21 ENCOUNTER — Encounter: Payer: Medicare Other | Admitting: Surgery

## 2023-04-22 ENCOUNTER — Ambulatory Visit: Payer: Medicare Other | Attending: Cardiovascular Disease | Admitting: Student

## 2023-04-22 ENCOUNTER — Encounter: Payer: Self-pay | Admitting: Student

## 2023-04-22 ENCOUNTER — Other Ambulatory Visit: Payer: Self-pay | Admitting: Cardiology

## 2023-04-22 VITALS — Ht 69.0 in | Wt 360.0 lb

## 2023-04-22 DIAGNOSIS — R7303 Prediabetes: Secondary | ICD-10-CM

## 2023-04-22 NOTE — Progress Notes (Signed)
Patient ID: Marc [name]                 DOB: May 08, 1976                    MRN: [id number]     HPI: Marc [name] is a 47 y.o. male patient referred to pharmacy clinic by Dr. Gasper Lloyd to initiate GLP1-RA therapy. PMH is significant for HFrEF 2/2 NICM, OSA on CPAP, hx of tobacco use, morbid obesity. Most recent BMI 52.87.  Baseline weight and BMI: 52.87 baseline Weight 163.2 kg  Goal is to loose 120 lbs  Current medications that affect weight - torsemide     Diet:  Breakfast : coffee   Lunch: left over from dinner,  Dinner: backed chicken ,stews, potatoes, spaghetti some fried food  Snack: popcorn, potato chips, nuts    Exercise: none    Family History:  Relation Problem Comments  Mother (Deceased) Dementia   Stroke     Father Metallurgist) Diabetes   Hypertension     Maternal Grandmother (Deceased)   Maternal Grandfather (Deceased)   Paternal Grandmother (Deceased)   Paternal Grandfather (Deceased) Diabetes   Hypertension       Social History:  Alcohol: none  Smoking: 1/2 pack per day  Recreational drug use:  Labs: Lab Results  Component Value Date   HGBA1C 6.2 (H) 02/24/2022    Wt Readings from Last 1 Encounters:  04/20/23 (!) 358 lb (162.4 kg)    BP Readings from Last 1 Encounters:  04/20/23 123/76   Pulse Readings from Last 1 Encounters:  04/20/23 73       Component Value Date/Time   CHOL 197 02/20/2022 1403   TRIG 218 (H) 02/20/2022 1403   HDL 45 02/20/2022 1403   CHOLHDL 4.4 02/20/2022 1403   CHOLHDL 3.2 02/01/2019 0300   VLDL 15 02/01/2019 0300   LDLCALC 114 (H) 02/20/2022 1403    Past Medical History:  Diagnosis Date   CHF (congestive heart failure) (HCC)    Hypertension     Current Outpatient Medications on File Prior to Visit  Medication Sig Dispense Refill   albuterol (VENTOLIN HFA) 108 (90 Base) MCG/ACT inhaler Inhale 2 puffs into the lungs every 6 (six) hours as needed for wheezing or shortness of breath. 18 g 0    amiodarone (PACERONE) 200 MG tablet Take 2 tablets (400 mg total) by mouth daily. PLEASE CALL OFFICE TO SCHEDULE APPOINTMENT PRIOR TO NEXT REFILL 180 tablet 0   aspirin EC 81 MG tablet Take 81 mg by mouth daily.     cyclobenzaprine (FLEXERIL) 10 MG tablet Take 1 tablet (10 mg total) by mouth every 8 (eight) hours as needed for muscle spasms. 30 tablet 0   dapagliflozin propanediol (FARXIGA) 10 MG TABS tablet Take 1 tablet (10 mg total) by mouth daily before breakfast. 90 tablet 3   digoxin (LANOXIN) 0.125 MG tablet Take 1 tablet (0.125 mg total) by mouth daily. 90 tablet 3   docusate sodium (COLACE) 100 MG capsule Take 100 mg by mouth daily as needed for mild constipation.     hydrocortisone (ANUSOL-HC) 2.5 % rectal cream Place 1 Application rectally 2 (two) times daily. (Patient taking differently: Place 1 Application rectally 2 (two) times daily as needed for anal itching or hemorrhoids.) 30 g 0   metoprolol succinate (TOPROL XL) 25 MG 24 hr tablet Take 1 tablet (25 mg total) by mouth at bedtime. 90 tablet 3   omeprazole (PRILOSEC) 20 MG  capsule Take 20 mg by mouth daily.     sacubitril-valsartan (ENTRESTO) 49-51 MG Take 1 tablet by mouth 2 (two) times daily. 28 tablet 1   sacubitril-valsartan (ENTRESTO) 49-51 MG Take 1 tablet by mouth 2 (two) times daily. 180 tablet 3   spironolactone (ALDACTONE) 25 MG tablet TAKE 1 TABLET BY MOUTH DAILY 100 tablet 2   tirzepatide (MOUNJARO) 5 MG/0.5ML Pen Inject 5 mg into the skin once a week. 2 mL 0   torsemide (DEMADEX) 20 MG tablet Take 2 tablets (40 mg total) by mouth daily. Extra torsenmide 40 in pm for 3 pound weight gain     umeclidinium-vilanterol (ANORO ELLIPTA) 62.5-25 MCG/ACT AEPB Inhale 1 puff into the lungs daily. 1 each 1   No current facility-administered medications on file prior to visit.    Allergies  Allergen Reactions   Geodon [Ziprasidone] Other (See Comments)    paralysis   Tramadol Other (See Comments)    Pt stated that it gave him  sores.   Amoxicillin Rash   Zithromax [Azithromycin] Rash     Assessment/Plan:  1. Weight loss - Patient already been taking 2.5 mg Mounjaro for past 4 weeks, had experienced lots of gas and constipation. He did not implement any dietary changes and has not started any exercise. Did not notice any change in weight or appetite.  Will be starting 5 mg dose from next Thursday. Confirmed patient has no personal or family history of medullary thyroid carcinoma (MTC) or Multiple Endocrine Neoplasia syndrome type 2 (MEN 2). Injection technique reviewed at today's visit.  Advised patient on common side effects including nausea, diarrhea, dyspepsia, decreased appetite, constipation/ diarrhea and fatigue. Counseled patient on reducing meal size and how to titrate medication to minimize side effects. Counseled patient to call if intolerable side effects or if experiencing dehydration, abdominal pain, or dizziness. Patient will adhere to dietary modifications and will target at least 150 minutes of moderate intensity exercise weekly. Advised patient to take Miralax for constipation.   Follow up in 1 month via telephone for tolerability update and dose titration.

## 2023-04-22 NOTE — Patient Instructions (Signed)
GLP1 Agonist Titration Plan:  Will plan to follow the titration plan as below, pending patient is tolerating each dose before increasing to the next. Can slow titration if needed for tolerability.     Mounjaro  -Month 1: Inject Mounjaro  2.5 mg once weekly x 4 weeks -Month 2: Inject Mounjaro 5 mg once weekly x 4 weeks -Month 3: Inject Mounjaro 7.5 mg once weekly x 4 weeks -Month 4: Inject Mounjaro 10 mg SQ once weekly x 4 weeks -Month 5: Inject Mounjaro 12.5 mg SQ once weekly  x 4 weeks -Month 6+: Inject Mounjaro 15 mg SQ once weekly

## 2023-04-23 ENCOUNTER — Telehealth (HOSPITAL_COMMUNITY): Payer: Self-pay

## 2023-04-23 DIAGNOSIS — J449 Chronic obstructive pulmonary disease, unspecified: Secondary | ICD-10-CM

## 2023-04-23 LAB — HEMOGLOBIN A1C
Est. average glucose Bld gHb Est-mCnc: 137 mg/dL
Hgb A1c MFr Bld: 6.4 % — ABNORMAL HIGH (ref 4.8–5.6)

## 2023-04-23 NOTE — Telephone Encounter (Signed)
Patient advised and verbalized understanding. Referral placed.   Orders Placed This Encounter  Procedures   Ambulatory referral to Pulmonology    Referral Priority:   Routine    Referral Type:   Consultation    Referral Reason:   Specialty Services Required    Requested Specialty:   Pulmonary Disease    Number of Visits Requested:   1

## 2023-04-23 NOTE — Telephone Encounter (Signed)
-----   Message from [facility] sent at 04/16/2023  3:30 PM EDT ----- His PFTs demonstrate severe COPD. Can we please get him in with pulmonology. Thank you.   Adi

## 2023-05-06 ENCOUNTER — Telehealth (HOSPITAL_COMMUNITY): Payer: Self-pay | Admitting: *Deleted

## 2023-05-07 NOTE — Progress Notes (Signed)
LVAD Initial Psychosocial Screening  Date/Time Initiated:  04-20-23 11:30am Referral Source:  LVAD Coordinator Referral Reason:  LVAD implantation Source of Information:  Patient, wife and chart review  Demographics Name:  Given [place] Address:  [address] [place] [address] Cell: [phone] Marital Status:  Married  Faith:  christian Primary Language:  English DOB:  [birth year]  Medical & Follow-up Adherence to Medical regimen/INR checks: compliant  Medication adherence: compliant  Physician/Clinic Appointment Attendance: compliant   Advance Directives: Do you have a Living Will or Medical POA? No  Would you like to complete a Living Will and Medical POA prior to surgery?  Pending Do you have Goals of Care? Yes  Have you had a consult with the Palliative Care Team at [facility]? No  Psychological Health Appearance:  Unremarkable Mental Status:  Alert, oriented Eye Contact:  Good Thought Content:  Coherent Speech:  Unremarkable Mood:  Appropriate and Good Spirit  Affect:  Appropriate to circumstance Insight:  Good Judgement: Unimpaired Interaction Style:  Engaged  Family/Social Information Who lives in your home? Name:   Relationship:   Voncylla   Wife Chelsea   Daughter (special needs) Lysbeth Galas  Mother in [place] (w/c bound)  Other family members/support persons in your life? Name:   Relationship:   Donetta  Sister (lives in [place])  Caregiving Needs Who is the primary caregiver? Voncylla wife Health status:  good Do you drive?  yes Do you work?  Yes- work from home Physical Limitations:  no Do you have other care giving responsibilities?  Yes daughter and mother  Who is the secondary caregiver? Donetta Health status:  good Do you drive?  yes Do you work?  Works from home Physical Limitations:  none Do you have other care giving responsibilities?  none  Home Environment/Personal Care Do you have reliable phone service? Yes  Spectrum Do you  own or rent your home? own Number of steps into the home? Ramp out front How many levels in the home? 1 Assistive devices in the home? cane Electrical needs for LVAD (3 prong outlets)? yes Second hand smoke exposure in the home? yes Travel distance from [facility]? 45 minutes  Community Are you active with community agencies/resources/homecare? No Agency Name:  n/a Are you active in a church, synagogue, mosque or other faith based community? Yes Faith based institutions name:  [employer] What other sources do you have for spiritual support? none Are you active in any clubs or social organizations? none What do you do for fun?  Hobbies?  Interests? Church and [employer] Information What is the last grade of school you completed?  Associates Degree Preferred method of learning?  Written, Verbal, and Hands on Do you have any problems with reading or writing?  No Are you currently employed?  No  When were you last employed? 2019  Name of employer? 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 No Are you interested in job training or learning new skills?  No Did you serve in the military? No    Financial Information What is your source of income? SSD Do you have difficulty meeting your monthly expenses? No Can you budget for the monthly cost for dressing supplies post procedure? Yes  Primary Health insurance:  [facility] Medicare Secondary Insurance: n/a Prescription plan: UHC What are your prescription co-pays? varies Do you use mail order for your prescriptions?  Yes Have you ever had to refuse medication due to cost?  No Have you applied for Medicaid?  denied Have you applied for Social Security Disability (SSI)  current  Medical Information Briefly  describe why you are here for evaluation: Initially diagnosed in 2014 and then hospitalized in 2019 and out on disability. Do you have a PCP or other medical provider? [name], NP Are you able to complete your ADL's? yes Do you have a history of trauma, physical, emotional, or sexual abuse? none Do you have any family history of heart problems? none Do you smoke now or past usage? Yes    cutting down 1/2 pack a day- smoking since age 62 Do you drink alcohol now or past usage? Yes    socially Are you currently using illegal drugs or misuse of medication or past usage? never  Have you ever been treated for substance abuse? No      If yes, where and when did you receive treatment?  Mental Health History How have you been feeling in the past year? "Pretty good just tired" Have you ever had any problems with depression, anxiety or other mental health issues? none Do you see a counselor, psychiatrist or therapist?  Briefly in the past.. could not find his son after Annamarie Major [place] If you are currently experiencing problems are you interested in talking with a professional? No Have you or are you taking medications for anxiety/depression or any mental health concerns?  No  What are your coping strategies under stressful situations? Pray Are there any other stressors in your life? none Have you had any past or current thoughts of suicide? no How many hours do you sleep at night? Off and on 4-6 hours total How is your appetite? Alright Would you be interested in attending the LVAD support group? yes  PHQ2 Depression Scale: 0  Legal Do you currently have any legal issues/problems?   none Have you had any legal issues/problems in the past?  denies   Plan for VAD Implementation Do you know and understand what happens during the VAD surgery? Patient Verbalizes Understanding  of surgery and able to describe details What do you know about the risks and side effect associated with VAD  surgery? Patient Verbalizes Understanding  of risks (infection, stroke and death) Explain what will happen right after surgery: Patient Verbalizes Understanding  of OR to ICU and will be intubated What is your plan for transportation for the first 8 weeks post-surgery? (Patients are not recommended to drive post-surgery for 8 weeks)  Driver:    wife Do you have airbags in your vehicle?  There is a risk of discharging the device if the airbag were to deploy. What do you know about your diet post-surgery? Patient Verbalizes Understanding  of Heart healthy How do you plan to monitor your medications, current and future?   Bottles How do you plan to complete ADL's post-surgery?  Ask for assistance if needed Will it be difficult to ask for help from your caregivers?  no   Please explain what you hope will be improved about your life as a result of receiving the LVAD? Would like to find a new career Please tell me your biggest concern or fear about living with the LVAD?  Just having it Please explain your understanding of how their body will  change. Are you worried about these changes? No, I got used to the defibrillator. Do you see any barriers to your surgery or follow-up? no  Understanding of LVAD Patient states understanding of the following: Surgical procedures and risks, Electrical need for LVAD (3 prong outlets), Safety precautions with LVAD (water, etc.), LVAD daily self-care (dressing changes, computer check, extra supplies), Outpatient follow up (LVAD clinic appts, monitoring blood thinners), and Need for Emergency Planning  Discussed and Reviewed with Patient and Caregiver  Patient's current level of motivation to prepare for LVAD: Motivated but hopeful for improvement with medications Patient's present Level of Consent for LVAD: Not ready yet  Education provided to patient/family/caregiver:   Caregiver role and responsibiltiy, Financial planning for LVAD, Role of Clinical Social  Worker, and Signs of Depression and Anxiety      Caregiver questions Please explain what you hope will be improved about your life and loved one's life as a result of receiving the LVAD?  Getting back in his routine and independence and extend life What is your biggest concern or fear about caregiving with an LVAD patient?   none What is your plan for availability to provide care 24/7 x2 weeks post op and dressing changes ongoing?  I work from home and have flexibility Who is the relief/backup caregiver and what is their availability?  Patient's sister Donetta Preferred method of learning? Written, Verbal, and Hands on  Do you drive? yes How do you handle stressful situations?   Prayer and my girlfriends Do you think you can do this?  yes Do you provide caregiving to anyone else? Mom and Daughter  Caregiver's current level of motivation to prepare for LVAD: Motivated Caregiver's present level of consent for LVAD: Ready when patient is ready  Clinical Interventions Needed:    CSW will monitor signs and symptoms of depression and assist with adjustment to life with an LVAD.  CSW will refer patient for Advanced Directives,  if not completed prior to surgery if still wishing to complete.  CSW will discuss smoking cessation and offer Health Coaching if interested. CSW encouraged attendance with the LVAD Support Group to assist further with adjustment and post implant peer support.  Clinical Impressions/Recommendations:   Mr Callo is a 47 year old married male. He reports he lives at home with his wife, adult special needs daughter and his mother in law. 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 He states he is in process of completing an Advanced Directive and wife will be HPOA. 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 He completed an associates degree and enjoys Gardening as a hobby. He is  currently on disability and denies any financial concerns at this time. He scored a 0 on the [facility] and denies any trauma or mental health concerns. He did share that he saw a therapist years ago when he was unable to locate his son after [place]. He admits to current tobacco use and smokes 1/2 pack a day although he attempting to cut down. He uses alcohol only socially and unsure when the last time was that he had a drink. He denies any illegal substance use. He prays during times of stress and has good support from his wife and family. He is hopeful to possibly start a new career post LVAD although shares that his biggest concern with the LVAD is "just having  it". He denies any concerns with body image. CSW would recommend patient for LVAD implantation when deemed medically appropriate.   Shane Crutch, CCSW-MCS [phone]

## 2023-05-16 ENCOUNTER — Other Ambulatory Visit: Payer: Self-pay | Admitting: Cardiology

## 2023-05-17 ENCOUNTER — Institutional Professional Consult (permissible substitution): Payer: Medicare Other | Admitting: Pulmonary Disease

## 2023-05-17 ENCOUNTER — Telehealth: Payer: Self-pay | Admitting: Pharmacist

## 2023-05-17 DIAGNOSIS — E118 Type 2 diabetes mellitus with unspecified complications: Secondary | ICD-10-CM

## 2023-05-17 NOTE — Telephone Encounter (Signed)
Receive prescription refill request from the pharmacy for Mounjaro 5 mg.   N/A LVM will send MyChart msg

## 2023-05-18 MED ORDER — MOUNJARO 7.5 MG/0.5ML ~~LOC~~ SOAJ: 7.5000 mg | SUBCUTANEOUS | 0 refills | Status: DC

## 2023-05-18 NOTE — Addendum Note (Signed)
Addended by: Tylene Fantasia on: 05/18/2023 08:43 AM   Modules accepted: Orders

## 2023-05-18 NOTE — Telephone Encounter (Signed)
Patient called back, reports his FBG ~120-125 Apatite has gone down, he has been eating healthy, doing some exercise. He admits that he can improve on his exercise routine. No S/E on 5 mg Mounjaro, in agreement to up the dose to 7.5 mg once week.  F/u via pone in 4 weeks

## 2023-05-18 NOTE — Telephone Encounter (Addendum)
Berkley Harvey #[id number] expires 11/11  Routed to [facility] pet scan pool to schedule within auth window.

## 2023-05-19 ENCOUNTER — Telehealth (HOSPITAL_COMMUNITY): Payer: Self-pay | Admitting: *Deleted

## 2023-05-19 ENCOUNTER — Encounter (HOSPITAL_COMMUNITY): Payer: Self-pay

## 2023-05-19 NOTE — Telephone Encounter (Signed)
 Attempted to call patient regarding upcoming cardiac MRI appointment. Left message on voicemail with name and callback number Johney Frame RN Navigator Cardiac Imaging [facility] [phone] Office

## 2023-05-20 ENCOUNTER — Ambulatory Visit (HOSPITAL_COMMUNITY): Admission: RE | Admit: 2023-05-20 | Payer: Medicare Other | Source: Ambulatory Visit

## 2023-05-21 ENCOUNTER — Encounter: Payer: Self-pay | Admitting: Pulmonary Disease

## 2023-05-31 ENCOUNTER — Ambulatory Visit (INDEPENDENT_AMBULATORY_CARE_PROVIDER_SITE_OTHER): Payer: Medicare Other | Admitting: Nurse Practitioner

## 2023-05-31 ENCOUNTER — Encounter: Payer: Self-pay | Admitting: Nurse Practitioner

## 2023-05-31 VITALS — BP 138/76 | HR 82 | Temp 98.7°F | Ht 69.0 in | Wt 352.6 lb

## 2023-05-31 DIAGNOSIS — M48061 Spinal stenosis, lumbar region without neurogenic claudication: Secondary | ICD-10-CM | POA: Diagnosis not present

## 2023-05-31 DIAGNOSIS — H6690 Otitis media, unspecified, unspecified ear: Secondary | ICD-10-CM

## 2023-05-31 DIAGNOSIS — Z23 Encounter for immunization: Secondary | ICD-10-CM

## 2023-05-31 DIAGNOSIS — M47816 Spondylosis without myelopathy or radiculopathy, lumbar region: Secondary | ICD-10-CM | POA: Diagnosis not present

## 2023-05-31 DIAGNOSIS — M5416 Radiculopathy, lumbar region: Secondary | ICD-10-CM | POA: Diagnosis not present

## 2023-05-31 DIAGNOSIS — Z1211 Encounter for screening for malignant neoplasm of colon: Secondary | ICD-10-CM

## 2023-05-31 MED ORDER — CYCLOBENZAPRINE HCL 10 MG PO TABS
10.0000 mg | ORAL_TABLET | Freq: Three times a day (TID) | ORAL | 0 refills | Status: DC | PRN
Start: 2023-05-31 — End: 2023-07-08

## 2023-05-31 MED ORDER — SULFAMETHOXAZOLE-TRIMETHOPRIM 800-160 MG PO TABS: 1.0000 | ORAL_TABLET | Freq: Two times a day (BID) | ORAL | 0 refills | Status: DC

## 2023-05-31 NOTE — Progress Notes (Unsigned)
BP 138/76   Pulse 82   Temp 98.7 F (37.1 C) (Oral)   Ht 5\' 9"  (1.753 m)   Wt (!) 352 lb 9.6 oz (159.9 kg)   SpO2 98%   BMI 52.07 kg/m    Subjective:    Patient ID: Marc [name], male    DOB: [birth year], 47 y.o.   MRN: [id number]  HPI: Marc [name] is a 47 y.o. male  Chief Complaint  Patient presents with   Ear Pain    Patient states he has been having bilateral ear pain for the last month. States it is starting to hurt more frequently. States he has noticed some blood coming from his R ear at times.    EAR PAIN Duration:  1 week to 1 month Involved ear(s): bilateral Severity:  5/10  Quality:  dull and aching Fever: no Otorrhea: yes Upper respiratory infection symptoms: yes Pruritus: no Hearing loss: no Water immersion no Using Q-tips: no Recurrent otitis media: no Status: better Treatments attempted: none  Relevant past medical, surgical, family and social history reviewed and updated as indicated. Interim medical history since our last visit reviewed. Allergies and medications reviewed and updated.  Review of Systems  Constitutional:  Negative for fatigue and fever.  HENT:  Positive for congestion, ear discharge and ear pain. Negative for postnasal drip, rhinorrhea, sinus pressure, sinus pain, sneezing and sore throat.   Respiratory:  Negative for cough, chest tightness, shortness of breath and wheezing.   Gastrointestinal:  Negative for vomiting.  Skin:  Negative for rash.  Neurological:  Negative for headaches.    Per HPI unless specifically indicated above     Objective:    BP 138/76   Pulse 82   Temp 98.7 F (37.1 C) (Oral)   Ht 5\' 9"  (1.753 m)   Wt (!) 352 lb 9.6 oz (159.9 kg)   SpO2 98%   BMI 52.07 kg/m   Wt Readings from Last 3 Encounters:  05/31/23 (!) 352 lb 9.6 oz (159.9 kg)  04/22/23 (!) 360 lb (163.3 kg)  04/20/23 (!) 358 lb (162.4 kg)    Physical Exam Vitals and nursing note reviewed.  Constitutional:      General: He  is not in acute distress.    Appearance: Normal appearance. He is not ill-appearing, toxic-appearing or diaphoretic.  HENT:     Head: Normocephalic.     Right Ear: External ear normal. Tympanic membrane is erythematous.     Left Ear: External ear normal. Tympanic membrane is erythematous.     Nose: Nose normal. No congestion or rhinorrhea.     Mouth/Throat:     Mouth: Mucous membranes are moist.  Eyes:     General:        Right eye: No discharge.        Left eye: No discharge.     Extraocular Movements: Extraocular movements intact.     Conjunctiva/sclera: Conjunctivae normal.     Pupils: Pupils are equal, round, and reactive to light.  Cardiovascular:     Rate and Rhythm: Normal rate and regular rhythm.     Heart sounds: No murmur heard. Pulmonary:     Effort: Pulmonary effort is normal. No respiratory distress.     Breath sounds: Normal breath sounds. No wheezing, rhonchi or rales.  Abdominal:     General: Abdomen is flat. Bowel sounds are normal.  Musculoskeletal:     Cervical back: Normal range of motion and neck supple.  Skin:  General: Skin is warm and dry.     Capillary Refill: Capillary refill takes less than 2 seconds.  Neurological:     General: No focal deficit present.     Mental Status: He is alert and oriented to person, place, and time.  Psychiatric:        Mood and Affect: Mood normal.        Behavior: Behavior normal.        Thought Content: Thought content normal.        Judgment: Judgment normal.     Results for orders placed or performed in visit on 04/22/23  HgB A1c  Result Value Ref Range   Hgb A1c MFr Bld 6.4 (H) 4.8 - 5.6 %   Est. average glucose Bld gHb Est-mCnc 137 mg/dL      Assessment & Plan:   Problem List Items Addressed This Visit   None Visit Diagnoses     Acute otitis media, unspecified otitis media type    -  Primary   Will treat with bactrim.  Complete course of antibiotics.  Follow up if not improved.   Relevant Medications    sulfamethoxazole-trimethoprim (BACTRIM DS) 800-160 MG tablet   Lumbar radiculopathy       Relevant Medications   cyclobenzaprine (FLEXERIL) 10 MG tablet   Spinal stenosis of lumbar region, unspecified whether neurogenic claudication present       Relevant Medications   cyclobenzaprine (FLEXERIL) 10 MG tablet   Lumbar spondylosis       Relevant Medications   cyclobenzaprine (FLEXERIL) 10 MG tablet   Lumbar foraminal stenosis       Relevant Medications   cyclobenzaprine (FLEXERIL) 10 MG tablet   Need for influenza vaccination       Relevant Orders   Flu vaccine trivalent PF, 6mos and older(Flulaval,Afluria,Fluarix,Fluzone)   Need for COVID-19 vaccine       Relevant Orders   Pfizer Comirnaty Covid -19 Vaccine 14yrs and older   Screening for colon cancer       Relevant Orders   Cologuard        Follow up plan: Return in about 1 month (around 07/01/2023) for HTN, HLD, DM2 FU.

## 2023-06-01 DIAGNOSIS — Z23 Encounter for immunization: Secondary | ICD-10-CM | POA: Diagnosis not present

## 2023-06-13 ENCOUNTER — Other Ambulatory Visit: Payer: Self-pay | Admitting: Cardiology

## 2023-06-13 DIAGNOSIS — E118 Type 2 diabetes mellitus with unspecified complications: Secondary | ICD-10-CM

## 2023-06-14 ENCOUNTER — Telehealth: Payer: Self-pay | Admitting: Pharmacist

## 2023-06-14 NOTE — Telephone Encounter (Signed)
Left message for pt to discuss tolerability and potential dose increase.

## 2023-06-14 NOTE — Telephone Encounter (Signed)
Received refill request for [facility]. Called pt and left message to follow up with tolerability and potential dose increase.

## 2023-06-15 MED ORDER — MOUNJARO 10 MG/0.5ML ~~LOC~~ SOAJ: 10.0000 mg | SUBCUTANEOUS | 0 refills | Status: DC

## 2023-06-15 NOTE — Telephone Encounter (Signed)
Spoke to patient, his FBG ~100-110 range. Tolerates 7.5 mg dose well. Lost around 5 lbs. Will up Mounjaro dose to 10 mg once week. Encourage patient to do more physical activity and try to eat healthy balanced meals

## 2023-06-15 NOTE — Addendum Note (Signed)
Addended by: Tylene Fantasia on: 06/15/2023 01:52 PM   Modules accepted: Orders

## 2023-06-17 ENCOUNTER — Ambulatory Visit (INDEPENDENT_AMBULATORY_CARE_PROVIDER_SITE_OTHER): Payer: Medicare Other

## 2023-06-17 DIAGNOSIS — I428 Other cardiomyopathies: Secondary | ICD-10-CM

## 2023-06-17 LAB — CUP PACEART REMOTE DEVICE CHECK
Battery Remaining Longevity: 160 mo
Battery Voltage: 3.05 V
Brady Statistic RV Percent Paced: 0.01 %
Date Time Interrogation Session: 20241030224415
HighPow Impedance: 87 Ohm
Implantable Lead Connection Status: 753985
Implantable Lead Implant Date: 20240501
Implantable Lead Location: 753860
Implantable Pulse Generator Implant Date: 20240501
Lead Channel Impedance Value: 304 Ohm
Lead Channel Impedance Value: 380 Ohm
Lead Channel Pacing Threshold Amplitude: 1.125 V
Lead Channel Pacing Threshold Pulse Width: 0.4 ms
Lead Channel Sensing Intrinsic Amplitude: 5.1 mV
Lead Channel Setting Pacing Amplitude: 2 V
Lead Channel Setting Pacing Pulse Width: 0.4 ms
Lead Channel Setting Sensing Sensitivity: 0.3 mV
Zone Setting Status: 755011

## 2023-06-23 LAB — COLOGUARD: COLOGUARD: NEGATIVE

## 2023-06-30 DIAGNOSIS — R7303 Prediabetes: Secondary | ICD-10-CM | POA: Insufficient documentation

## 2023-06-30 NOTE — Progress Notes (Unsigned)
There were no vitals taken for this visit.   Subjective:    Patient ID: Marc [name], male    DOB: [birth year], 47 y.o.   MRN: [id number]  HPI: Marc [name] is a 47 y.o. male  No chief complaint on file.  HYPERTENSION/HEART FAILURE Hypertension status: controlled  Satisfied with current treatment? no Duration of hypertension: years BP monitoring frequency:  not checking BP range:  BP medication side effects:  no Medication compliance: excellent compliance Previous BP meds: and Entresto, carvedilol, and spironalactone Aspirin: yes Recurrent headaches: no Visual changes: no Palpitations: no Dyspnea: no Chest pain: no Lower extremity edema: no Dizzy/lightheaded: no  FOOT PAIN Patient states his left foot has really been bothering him. Feels like he is stepping on needles.  He has a history of Gout and he is taking Allopurinol daily.  Patient states the pain has been going on for a couple of months.  But the pain worsened a week ago.    EYE PAIN Patient states his eye pain in the left eye.  States he keeps getting an infection.  States he has had this 3 times.  He tried switching out his cpap mask but that hasn't helped.   Relevant past medical, surgical, family and social history reviewed and updated as indicated. Interim medical history since our last visit reviewed. Allergies and medications reviewed and updated.  Review of Systems  Eyes:  Positive for pain and discharge. Negative for visual disturbance.       Swollen eyelid  Respiratory:  Negative for shortness of breath.   Cardiovascular:  Negative for chest pain and leg swelling.  Musculoskeletal:        Foot pain  Neurological:  Negative for light-headedness and headaches.   Per HPI unless specifically indicated above     Objective:    There were no vitals taken for this visit.  Wt Readings from Last 3 Encounters:  05/31/23 (!) 352 lb 9.6 oz (159.9 kg)  04/22/23 (!) 360 lb (163.3 kg)  04/20/23 (!)  358 lb (162.4 kg)    Physical Exam Vitals and nursing note reviewed.  Constitutional:      General: He is not in acute distress.    Appearance: Normal appearance. He is obese. He is not ill-appearing, toxic-appearing or diaphoretic.  HENT:     Head: Normocephalic.     Right Ear: External ear normal.     Left Ear: External ear normal.     Nose: Nose normal. No congestion or rhinorrhea.     Mouth/Throat:     Mouth: Mucous membranes are moist.  Eyes:     General:        Right eye: No discharge.        Left eye: No discharge.     Extraocular Movements: Extraocular movements intact.     Conjunctiva/sclera: Conjunctivae normal.     Pupils: Pupils are equal, round, and reactive to light.   Cardiovascular:     Rate and Rhythm: Normal rate and regular rhythm.     Heart sounds: No murmur heard. Pulmonary:     Effort: Pulmonary effort is normal. No respiratory distress.     Breath sounds: Normal breath sounds. No wheezing, rhonchi or rales.  Abdominal:     General: Abdomen is flat. Bowel sounds are normal.  Musculoskeletal:     Cervical back: Normal range of motion and neck supple.  Feet:     Comments: Pain, swelling and warmth of left great toe Skin:  General: Skin is warm and dry.     Capillary Refill: Capillary refill takes less than 2 seconds.  Neurological:     General: No focal deficit present.     Mental Status: He is alert and oriented to person, place, and time.  Psychiatric:        Mood and Affect: Mood normal.        Behavior: Behavior normal.        Thought Content: Thought content normal.        Judgment: Judgment normal.    Results for orders placed or performed in visit on 06/17/23  CUP PACEART REMOTE DEVICE CHECK  Result Value Ref Range   Date Time Interrogation Session [id number]    Pulse Generator Manufacturer MERM    Pulse Gen Model [id number] Cobalt XT VR MRI    Pulse Gen Serial Number [id number]    Clinic Name [facility]    Implantable Pulse  Generator Type Implantable Cardiac Defibulator    Implantable Pulse Generator Implant Date 62130865    Implantable Lead Manufacturer [facility]    Implantable Lead Model [id number] Sprint Quattro Secure S MRI SureScan    Implantable Lead Serial Number [id number]    Implantable Lead Implant Date [id number]    Implantable Lead Location Detail 1 SEPTUM    Implantable Lead Location [id number]    Implantable Lead Connection Status [id number]    Lead Channel Setting Sensing Sensitivity 0.3 mV   Lead Channel Setting Pacing Pulse Width 0.4 ms   Lead Channel Setting Pacing Amplitude 2.0 V   Zone Setting Status Active    Zone Setting Status Inactive    Zone Setting Status Inactive    Zone Setting Status [phone]    Lead Channel Impedance Value 304 ohm   Lead Channel Impedance Value 380 ohm   Lead Channel Sensing Intrinsic Amplitude 5.1 mV   Lead Channel Pacing Threshold Amplitude 1.125 V   Lead Channel Pacing Threshold Pulse Width 0.4 ms   HighPow Impedance 87 ohm   Battery Remaining Longevity 160 mo   Battery Voltage 3.05 V   Brady Statistic RV Percent Paced 0.01 %      Assessment & Plan:   Problem List Items Addressed This Visit       Cardiovascular and Mediastinum   Hypertension   Aortic atherosclerosis (HCC) - Primary   Chronic systolic heart failure (HCC)     Other   Morbid obesity (HCC)   BMI 50.0-59.9, adult (HCC)     Follow up plan: No follow-ups on file.

## 2023-06-30 NOTE — Progress Notes (Signed)
Remote ICD transmission.   

## 2023-07-01 ENCOUNTER — Encounter: Payer: Self-pay | Admitting: Nurse Practitioner

## 2023-07-01 ENCOUNTER — Ambulatory Visit (INDEPENDENT_AMBULATORY_CARE_PROVIDER_SITE_OTHER): Payer: Medicare Other | Admitting: Nurse Practitioner

## 2023-07-01 VITALS — BP 112/76 | HR 90 | Temp 98.2°F | Ht 69.0 in | Wt 353.4 lb

## 2023-07-01 DIAGNOSIS — Z6841 Body Mass Index (BMI) 40.0 and over, adult: Secondary | ICD-10-CM | POA: Diagnosis not present

## 2023-07-01 DIAGNOSIS — I10 Essential (primary) hypertension: Secondary | ICD-10-CM

## 2023-07-01 DIAGNOSIS — R7303 Prediabetes: Secondary | ICD-10-CM | POA: Diagnosis not present

## 2023-07-01 DIAGNOSIS — I700 Atherosclerosis of aorta: Secondary | ICD-10-CM | POA: Diagnosis not present

## 2023-07-01 DIAGNOSIS — I5022 Chronic systolic (congestive) heart failure: Secondary | ICD-10-CM

## 2023-07-01 DIAGNOSIS — B351 Tinea unguium: Secondary | ICD-10-CM

## 2023-07-01 DIAGNOSIS — I509 Heart failure, unspecified: Secondary | ICD-10-CM

## 2023-07-01 LAB — MICROALBUMIN, URINE WAIVED
Creatinine, Urine Waived: 200 mg/dL (ref 10–300)
Microalb, Ur Waived: 150 mg/L — ABNORMAL HIGH (ref 0–19)

## 2023-07-01 MED ORDER — TRIAMCINOLONE ACETONIDE 0.1 % EX CREA: 1.0000 | TOPICAL_CREAM | Freq: Two times a day (BID) | CUTANEOUS | 0 refills | Status: AC

## 2023-07-01 NOTE — Assessment & Plan Note (Addendum)
Chronic. Followed by Cardiology.  Encouraged patient to weight daily.  Keep follow up with HF clinic.   - Reminded to call for an overnight weight gain of >2 pounds or a weekly weight gain of >5 pounds -On Entresto, Farxiga, Torsemide. - not adding salt to food and read food labels. Reviewed the importance of keeping daily sodium intake to 2000mg  daily. - Avoid Ibuprofen products.

## 2023-07-01 NOTE — Assessment & Plan Note (Signed)
Chronic.  Controlled.  Continue with current medication regimen of ASA. Labs ordered today.  Return to clinic in 6 months for reevaluation.  Call sooner if concerns arise.   

## 2023-07-01 NOTE — Assessment & Plan Note (Signed)
Chronic.  Controlled.  Continue with current medication regimen of Entresto, Carvdilol and Spirnolactone.  Followed by HF clinic. Labs ordered today.  Return to clinic in 6 months for reevaluation.  Call sooner if concerns arise.

## 2023-07-01 NOTE — Assessment & Plan Note (Signed)
Recommended eating smaller high protein, low fat meals more frequently and exercising 30 mins a day 5 times a week with a goal of 10-15lb weight loss in the next 3 months.  Currently on Mounjaro.

## 2023-07-01 NOTE — Assessment & Plan Note (Deleted)
Chronic.  On Entresto, Farxiga, Torsemide.  Followed by

## 2023-07-01 NOTE — Assessment & Plan Note (Signed)
Chronic.  Last A1c was 6.4%.  Continue with Mounjaro.

## 2023-07-02 LAB — LIPID PANEL
Chol/HDL Ratio: 4.4 ratio (ref 0.0–5.0)
Cholesterol, Total: 217 mg/dL — ABNORMAL HIGH (ref 100–199)
HDL: 49 mg/dL (ref >39)
LDL Chol Calc (NIH): 139 mg/dL — ABNORMAL HIGH (ref 0–99)
Triglycerides: 162 mg/dL — ABNORMAL HIGH (ref 0–149)
VLDL Cholesterol Cal: 29 mg/dL (ref 5–40)

## 2023-07-02 LAB — COMPREHENSIVE METABOLIC PANEL
ALT: 13 [IU]/L (ref 0–44)
AST: 18 [IU]/L (ref 0–40)
Albumin: 4.3 g/dL (ref 4.1–5.1)
Alkaline Phosphatase: 118 [IU]/L (ref 44–121)
BUN/Creatinine Ratio: 14 (ref 9–20)
BUN: 17 mg/dL (ref 6–24)
Bilirubin Total: <0.2 mg/dL (ref 0.0–1.2)
CO2: 21 mmol/L (ref 20–29)
Calcium: 9.3 mg/dL (ref 8.7–10.2)
Chloride: 100 mmol/L (ref 96–106)
Creatinine, Ser: 1.18 mg/dL (ref 0.76–1.27)
Globulin, Total: 3.7 g/dL (ref 1.5–4.5)
Glucose: 87 mg/dL (ref 70–99)
Potassium: 4 mmol/L (ref 3.5–5.2)
Sodium: 138 mmol/L (ref 134–144)
Total Protein: 8 g/dL (ref 6.0–8.5)
eGFR: 76 mL/min/{1.73_m2} (ref >59)

## 2023-07-02 LAB — HEMOGLOBIN A1C
Est. average glucose Bld gHb Est-mCnc: 126 mg/dL
Hgb A1c MFr Bld: 6 % — ABNORMAL HIGH (ref 4.8–5.6)

## 2023-07-06 ENCOUNTER — Other Ambulatory Visit: Payer: Self-pay | Admitting: Nurse Practitioner

## 2023-07-06 ENCOUNTER — Encounter: Payer: Self-pay | Admitting: Nurse Practitioner

## 2023-07-06 DIAGNOSIS — M5416 Radiculopathy, lumbar region: Secondary | ICD-10-CM

## 2023-07-06 DIAGNOSIS — M48061 Spinal stenosis, lumbar region without neurogenic claudication: Secondary | ICD-10-CM

## 2023-07-06 DIAGNOSIS — M47816 Spondylosis without myelopathy or radiculopathy, lumbar region: Secondary | ICD-10-CM

## 2023-07-06 MED ORDER — ROSUVASTATIN CALCIUM 5 MG PO TABS: 5.0000 mg | ORAL_TABLET | Freq: Every day | ORAL | 1 refills | Status: DC

## 2023-07-08 NOTE — Telephone Encounter (Signed)
Requested medications are due for refill today.  yes  Requested medications are on the active medications list.  yes  Last refill. 05/31/2023 #30 0 rf  Future visit scheduled.   no  Notes to clinic.  Refill not delegated.    Requested Prescriptions  Pending Prescriptions Disp Refills   cyclobenzaprine (FLEXERIL) 10 MG tablet [Pharmacy Med Name: Cyclobenzaprine HCl 10 MG Oral Tablet] 30 tablet 0    Sig: TAKE 1 TABLET BY MOUTH EVERY 8 HOURS AS NEEDED FOR MUSCLE SPASM     Not Delegated - Analgesics:  Muscle Relaxants Failed - 07/06/2023  3:08 PM      Failed - This refill cannot be delegated      Passed - Valid encounter within last 6 months    Recent Outpatient Visits           1 week ago Aortic atherosclerosis [facility])   [place] [facility] [name], NP   1 month ago Acute otitis media, unspecified otitis media type   [place] [facility] [name], NP   7 months ago Right-sided low back pain with right-sided sciatica, unspecified chronicity   [place] [facility] Mecum, Oswaldo Conroy, PA-C   1 year ago Primary hypertension   [place] [facility] [name], NP   1 year ago Acute pain of left knee   [place] [facility] [name], NP

## 2023-07-09 ENCOUNTER — Telehealth: Payer: Self-pay | Admitting: Pharmacist

## 2023-07-09 NOTE — Telephone Encounter (Signed)
Mounjaro dose titration - called LVM and MyChart sent

## 2023-07-12 MED ORDER — MOUNJARO 12.5 MG/0.5ML ~~LOC~~ SOAJ: 12.5000 mg | SUBCUTANEOUS | 0 refills | Status: DC

## 2023-07-12 NOTE — Addendum Note (Signed)
Addended by: Tylene Fantasia on: 07/12/2023 10:23 AM   Modules accepted: Orders

## 2023-07-13 ENCOUNTER — Encounter (HOSPITAL_COMMUNITY): Payer: Self-pay

## 2023-07-20 ENCOUNTER — Telehealth (HOSPITAL_COMMUNITY): Payer: Self-pay | Admitting: *Deleted

## 2023-07-20 NOTE — Telephone Encounter (Signed)
Reaching out to patient to offer assistance regarding upcoming cardiac imaging study; pt verbalizes understanding of appt date/time, parking situation and where to check in, pre-test NPO status and medications ordered, and verified current allergies; name and call back number provided for further questions should they arise Johney Frame RN Navigator Cardiac Imaging [facility] [phone] office [phone] cell  Patient verbalized understanding of diet prep.

## 2023-07-21 ENCOUNTER — Other Ambulatory Visit: Payer: Self-pay | Admitting: Family

## 2023-07-22 ENCOUNTER — Encounter (HOSPITAL_COMMUNITY)
Admission: RE | Admit: 2023-07-22 | Discharge: 2023-07-22 | Disposition: A | Discharge status: Home / Self Care | Payer: Medicare Other | Source: Ambulatory Visit | Attending: Cardiology | Admitting: Cardiology

## 2023-07-22 DIAGNOSIS — I5022 Chronic systolic (congestive) heart failure: Secondary | ICD-10-CM | POA: Diagnosis not present

## 2023-07-22 MED ORDER — RUBIDIUM RB82 GENERATOR (RUBYFILL)
29.9800 | PACK | Freq: Once | INTRAVENOUS | Status: AC
  Administered 2023-07-22: 29.98 via INTRAVENOUS

## 2023-07-22 MED ORDER — FLUDEOXYGLUCOSE F - 18 (FDG) INJECTION
8.7500 | Freq: Once | INTRAVENOUS | Status: AC
  Administered 2023-07-22: 8.75 via INTRAVENOUS

## 2023-07-23 LAB — NM PET CT MYOCARDIAL SARCOIDOSIS
Nuc Stress EF: 22 %
Rest Nuclear Isotope Dose: 30 mCi

## 2023-07-27 ENCOUNTER — Ambulatory Visit: Payer: Medicare Other | Admitting: Podiatry

## 2023-07-27 ENCOUNTER — Encounter: Payer: Self-pay | Admitting: Podiatry

## 2023-07-27 DIAGNOSIS — L02619 Cutaneous abscess of unspecified foot: Secondary | ICD-10-CM | POA: Diagnosis not present

## 2023-07-27 DIAGNOSIS — L03119 Cellulitis of unspecified part of limb: Secondary | ICD-10-CM | POA: Diagnosis not present

## 2023-07-27 MED ORDER — GENTAMICIN SULFATE 0.1 % EX CREA: 1.0000 | TOPICAL_CREAM | Freq: Two times a day (BID) | CUTANEOUS | 2 refills | Status: DC

## 2023-07-27 MED ORDER — CIPROFLOXACIN HCL 500 MG PO TABS
500.0000 mg | ORAL_TABLET | Freq: Two times a day (BID) | ORAL | 0 refills | Status: AC
Start: 2023-07-27 — End: 2023-08-06

## 2023-07-27 MED ORDER — CLOTRIMAZOLE-BETAMETHASONE 1-0.05 % EX CREA: 1.0000 | TOPICAL_CREAM | Freq: Every day | CUTANEOUS | 2 refills | Status: DC

## 2023-07-27 NOTE — Progress Notes (Signed)
Chief Complaint  Patient presents with   Tinea Pedis    "I have had infection in my toes.  It has spread to my left foot." N - infection L - interdigital 4/5 and plantar bilateral D - 1 month O - suddenly, gotten worse C - swelling, burn, itch, odor A - none T - antibacterial soap, Lamisil Cream and Powder    HPI: 47 y.o. male presenting today as a new patient for evaluation of drainage with wound development to the interdigital areas of the bilateral feet.  Patient has a history of wounds that developed to the bilateral feet.  He does admit to wearing dress shoes.  This most recent onset was about 1 month ago.  Past Medical History:  Diagnosis Date   CHF (congestive heart failure) (HCC)    Hypertension     Past Surgical History:  Procedure Laterality Date   BICEPS TENDON REPAIR     COLONOSCOPY WITH PROPOFOL N/A 10/09/2020   Procedure: COLONOSCOPY WITH PROPOFOL;  Surgeon: [name], MD;  Location: [facility] ENDOSCOPY;  Service: Gastroenterology;  Laterality: N/A;   FLEXIBLE SIGMOIDOSCOPY N/A 10/30/2020   Procedure: FLEXIBLE SIGMOIDOSCOPY;  Surgeon: [name], MD;  Location: [facility] ENDOSCOPY;  Service: Gastroenterology;  Laterality: N/A;   FOOT SURGERY     metataral and fasciotomy   ICD IMPLANT N/A 12/16/2022   Procedure: ICD IMPLANT;  Surgeon: Lanier Prude, MD;  Location: [facility] INVASIVE CV LAB;  Service: Cardiovascular;  Laterality: N/A;   RIGHT HEART CATH N/A 03/16/2023   Procedure: RIGHT HEART CATH;  Surgeon: Dorthula Nettles, DO;  Location: [facility] INVASIVE CV LAB;  Service: Cardiovascular;  Laterality: N/A;   RIGHT/LEFT HEART CATH AND CORONARY ANGIOGRAPHY Bilateral 09/19/2020   Procedure: RIGHT/LEFT HEART CATH AND CORONARY ANGIOGRAPHY;  Surgeon: Alwyn Pea, MD;  Location: [facility] INVASIVE CV LAB;  Service: Cardiovascular;  Laterality: Bilateral;   ROTATOR CUFF REPAIR      Allergies  Allergen Reactions   Geodon [Ziprasidone] Other (See Comments)     paralysis   Tramadol Other (See Comments)    Pt stated that it gave him sores.   Amoxicillin Rash   Zithromax [Azithromycin] Rash     RT foot 07/27/2023  Physical Exam: General: The patient is alert and oriented x3 in no acute distress.  Dermatology: InterDigital skin breakdown with maceration and drainage noted to the digits 3, 4, 5 bilateral feet RT > LT. please see above noted photo.  Mild malodor.  Remaining skin is cool to touch.  No concern for more proximal infection  Vascular: Palpable pedal pulses bilaterally. Capillary refill within normal limits.  No appreciable edema.  No erythema.  Neurological: Grossly intact via light touch  Musculoskeletal Exam: No pedal deformities noted  Assessment/Plan of Care: 1.  Interdigital macerated fungal infection with superinfected bacterial infection  -Patient evaluated -Advised against wearing any close toed shoes for the moment.  Patient does state that he wears dress shoes.  Advised against this -Postop shoes dispensed.  WBAT bilateral -Prescription for Cipro 500 mg BID x 14 days -Prescription for Lotrisone cream as well as gentamicin cream to apply in a 50-50 mixture bilateral interdigital toes -Return to clinic 2 weeks     Felecia Shelling, DPM [facility]  Dr. Felecia Shelling, DPM    [address]  [place], [place] [address]                Office [phone]  Fax [phone]

## 2023-07-29 ENCOUNTER — Telehealth: Payer: Self-pay

## 2023-07-29 NOTE — Telephone Encounter (Signed)
Patient called - tyelnol and ibuprofen just aren't cutting it - he needs something stronger for pain

## 2023-07-30 ENCOUNTER — Other Ambulatory Visit: Payer: Self-pay | Admitting: Podiatry

## 2023-07-30 ENCOUNTER — Telehealth: Payer: Self-pay | Admitting: Podiatry

## 2023-07-30 MED ORDER — HYDROCODONE-ACETAMINOPHEN 5-325 MG PO TABS: 1.0000 | ORAL_TABLET | Freq: Four times a day (QID) | ORAL | 0 refills | Status: DC | PRN

## 2023-07-30 NOTE — Telephone Encounter (Signed)
Pt called again and was seen Tuesday and is having a lot of pain and is needing a stronger medication, the tylenol and ibuprofen are not helping. He cannot sleep at night due to the pain. Pharmacy is correct in the chart.

## 2023-07-30 NOTE — Progress Notes (Signed)
PRN foot pain.   Felecia Shelling, DPM [facility]  Dr. Felecia Shelling, DPM    [address] [place], [place] [address]                Office [phone]  Fax [phone]

## 2023-08-02 ENCOUNTER — Ambulatory Visit (HOSPITAL_BASED_OUTPATIENT_CLINIC_OR_DEPARTMENT_OTHER): Payer: Medicare Other | Admitting: Cardiology

## 2023-08-02 ENCOUNTER — Other Ambulatory Visit: Payer: Self-pay | Admitting: Pharmacist

## 2023-08-02 ENCOUNTER — Telehealth: Payer: Self-pay

## 2023-08-02 ENCOUNTER — Other Ambulatory Visit
Admission: RE | Admit: 2023-08-02 | Discharge: 2023-08-02 | Disposition: A | Discharge status: Home / Self Care | Payer: Medicare Other | Source: Ambulatory Visit | Attending: Cardiology | Admitting: Cardiology

## 2023-08-02 ENCOUNTER — Other Ambulatory Visit (HOSPITAL_COMMUNITY): Payer: Self-pay

## 2023-08-02 ENCOUNTER — Encounter: Payer: Self-pay | Admitting: Cardiology

## 2023-08-02 VITALS — BP 116/68 | HR 96 | Ht 69.0 in | Wt 359.4 lb

## 2023-08-02 DIAGNOSIS — S91309A Unspecified open wound, unspecified foot, initial encounter: Secondary | ICD-10-CM

## 2023-08-02 DIAGNOSIS — Z6841 Body Mass Index (BMI) 40.0 and over, adult: Secondary | ICD-10-CM

## 2023-08-02 DIAGNOSIS — J449 Chronic obstructive pulmonary disease, unspecified: Secondary | ICD-10-CM

## 2023-08-02 DIAGNOSIS — I5022 Chronic systolic (congestive) heart failure: Secondary | ICD-10-CM

## 2023-08-02 DIAGNOSIS — I493 Ventricular premature depolarization: Secondary | ICD-10-CM | POA: Diagnosis not present

## 2023-08-02 LAB — BASIC METABOLIC PANEL
Anion gap: 10 (ref 5–15)
BUN: 14 mg/dL (ref 6–20)
CO2: 28 mmol/L (ref 22–32)
Calcium: 8.6 mg/dL — ABNORMAL LOW (ref 8.9–10.3)
Chloride: 98 mmol/L (ref 98–111)
Creatinine, Ser: 1.2 mg/dL (ref 0.61–1.24)
GFR, Estimated: >60 mL/min (ref >60)
Glucose, Bld: 88 mg/dL (ref 70–99)
Potassium: 3.8 mmol/L (ref 3.5–5.1)
Sodium: 136 mmol/L (ref 135–145)

## 2023-08-02 LAB — BRAIN NATRIURETIC PEPTIDE: B Natriuretic Peptide: 82.2 pg/mL (ref 0.0–100.0)

## 2023-08-02 LAB — DIGOXIN LEVEL: Digoxin Level: <0.2 ng/mL — ABNORMAL LOW (ref 0.8–2.0)

## 2023-08-02 MED ORDER — SACUBITRIL-VALSARTAN 49-51 MG PO TABS: 1.0000 | ORAL_TABLET | Freq: Two times a day (BID) | ORAL | 3 refills | Status: DC

## 2023-08-02 MED ORDER — DIGOXIN 125 MCG PO TABS: 0.1250 mg | ORAL_TABLET | Freq: Every day | ORAL | 3 refills | Status: DC

## 2023-08-02 MED ORDER — TORSEMIDE 20 MG PO TABS: ORAL_TABLET | ORAL | 3 refills | Status: DC

## 2023-08-02 MED ORDER — METOPROLOL SUCCINATE ER 25 MG PO TB24: 25.0000 mg | ORAL_TABLET | Freq: Every day | ORAL | 3 refills | Status: DC

## 2023-08-02 MED ORDER — SPIRONOLACTONE 25 MG PO TABS: 25.0000 mg | ORAL_TABLET | Freq: Every day | ORAL | 2 refills | Status: DC

## 2023-08-02 MED ORDER — POTASSIUM CHLORIDE CRYS ER 20 MEQ PO TBCR: 20.0000 meq | EXTENDED_RELEASE_TABLET | Freq: Every day | ORAL | 3 refills | Status: DC

## 2023-08-02 NOTE — Progress Notes (Signed)
ADVANCED HEART FAILURE CLINIC NOTE  Referring Physician: [name], NP  Primary Care: Marc Grooms, NP  HPI: Marc [name] is a 47 y.o. male with HFrEF 2/2 NICM, OSA on CPAP, hx of tobacco use, morbid obesity presenting today to establish care. [name] reports first being diagnosed with systolic heart failure around 1601 at [facility]. He was diagnosed with the flu around that time followed by persistent SOB and diagnosis of nonischemic cardiomyopathy. 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 He was followed for several years at [facility] with LVEF ranging from 25-35%. He most recently presented to the [facility] ER in December 2023 with complaints of chest discomfort. At that time he was referred to HF clinic for further evaluation.  He had an echocardiogram in June 2023 demonstrating an LVEF of 25% and a repeat echo today w/ persistently reduced function.  He is now s/p ICD implant in 12/2022. Briefly required admission afterwards due to volume overload.   Interval hx:  - He was unfortunately lost to follow up for the past several months.  -Since that time he is doing much worse clinically.  He has gained weight (reports that Marc [name] is now 200/month), has lower extremity wounds (interdigital skin breakdown), and has gained 15 to 20 pounds of fluid.  He becomes increasingly short of breath now with symptoms consistent with NYHA III functional class.  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 - I had a very lengthy conversation today with him regarding his prognosis and need for close follow-up.  He said he had multiple stressors in his life during LVAD evaluation and became overwhelmed.  - He has been taking torsemide 40mg  every morning until 2 days ago when he increased to 60mg .   Activity level/exercise tolerance:  NYHA III; also limited by underlying  sciatica.  Orthopnea:  Sleeps on 3 pillows Paroxysmal noctural dyspnea:  No Chest pain/pressure:  No Orthostatic lightheadedness:  No Palpitations:  No Lower extremity edema:  yes, 2-3+ to the knees.  Presyncope/syncope:  No Cough:  No  Past Medical History:  Diagnosis Date   CHF (congestive heart failure) (HCC)    Hypertension     Current Outpatient Medications  Medication Sig Dispense Refill   albuterol (VENTOLIN HFA) 108 (90 Base) MCG/ACT inhaler Inhale 2 puffs into the lungs every 6 (six) hours as needed for wheezing or shortness of breath. 18 g 0   amiodarone (PACERONE) 200 MG tablet Take 2 tablets (400 mg total) by mouth daily. PLEASE CALL OFFICE TO SCHEDULE APPOINTMENT PRIOR TO NEXT REFILL 180 tablet 0   aspirin EC 81 MG tablet Take 81 mg by mouth daily.     ciprofloxacin (CIPRO) 500 MG tablet Take 1 tablet (500 mg total) by mouth 2 (two) times daily for 10 days. 28 tablet 0   clotrimazole-betamethasone (LOTRISONE) cream Apply 1 Application topically daily. 30 g 2   cyclobenzaprine (FLEXERIL) 10 MG tablet TAKE 1 TABLET BY MOUTH EVERY 8 HOURS AS NEEDED FOR MUSCLE SPASM 30 tablet 0   dapagliflozin propanediol (FARXIGA) 10 MG TABS tablet Take 1 tablet (10 mg total) by mouth daily before breakfast. 90 tablet 3   digoxin (LANOXIN) 0.125 MG tablet Take 1 tablet (0.125 mg total) by mouth daily. 90 tablet 3   docusate sodium (COLACE) 100 MG capsule Take 100 mg by mouth daily as needed for mild  constipation.     gentamicin cream (GARAMYCIN) 0.1 % Apply 1 Application topically 2 (two) times daily. 30 g 2   HYDROcodone-acetaminophen (NORCO/VICODIN) 5-325 MG tablet Take 1 tablet by mouth every 6 (six) hours as needed for moderate pain (pain score 4-6). 20 tablet 0   hydrocortisone (ANUSOL-HC) 2.5 % rectal cream Place 1 Application rectally 2 (two) times daily. (Patient taking differently: Place 1 Application rectally 2 (two) times daily as needed for anal itching or hemorrhoids.) 30 g 0    metoprolol succinate (TOPROL XL) 25 MG 24 hr tablet Take 1 tablet (25 mg total) by mouth at bedtime. 90 tablet 3   omeprazole (PRILOSEC) 20 MG capsule Take 20 mg by mouth daily.     rosuvastatin (CRESTOR) 5 MG tablet Take 1 tablet (5 mg total) by mouth daily. 90 tablet 1   sacubitril-valsartan (ENTRESTO) 49-51 MG Take 1 tablet by mouth 2 (two) times daily. 180 tablet 3   spironolactone (ALDACTONE) 25 MG tablet TAKE 1 TABLET BY MOUTH DAILY 100 tablet 2   torsemide (DEMADEX) 20 MG tablet Take 2 tablets (40 mg total) by mouth daily. Extra torsenmide 40 in pm for 3 pound weight gain     triamcinolone cream (KENALOG) 0.1 % Apply 1 Application topically 2 (two) times daily. 30 g 0   umeclidinium-vilanterol (ANORO ELLIPTA) 62.5-25 MCG/ACT AEPB Inhale 1 puff into the lungs daily. 1 each 1   tirzepatide (MOUNJARO) 12.5 MG/0.5ML Pen Inject 12.5 mg into the skin once a week. (Patient not taking: Reported on 08/02/2023) 2 mL 0   No current facility-administered medications for this visit.    Allergies  Allergen Reactions   Geodon [Ziprasidone] Other (See Comments)    paralysis   Tramadol Other (See Comments)    Pt stated that it gave him sores.   Amoxicillin Rash   Zithromax [Azithromycin] Rash      Social History   Socioeconomic History   Marital status: Married    Spouse name: Not on file   Number of children: Not on file   Years of education: Not on file   Highest education level: Not on file  Occupational History   Not on file  Tobacco Use   Smoking status: Every Day    Current packs/day: 0.50    Average packs/day: 0.5 packs/day for 25.0 years (12.5 ttl pk-yrs)    Types: Cigarettes   Smokeless tobacco: Never  Vaping Use   Vaping status: Never Used  Substance and Sexual Activity   Alcohol use: Not Currently   Drug use: Not Currently   Sexual activity: Yes  Other Topics Concern   Not on file  Social History Narrative   Wife, Marc [name], at bedside.   Social Drivers of Manufacturing engineer Strain: Low Risk  (05/26/2022)   Overall Financial Resource Strain (CARDIA)    Difficulty of Paying Living Expenses: Not hard at all  Food Insecurity: No Food Insecurity (12/21/2022)   Hunger Vital Sign    Worried About Running Out of Food in the Last Year: Never true    Ran Out of Food in the Last Year: Never true  Transportation Needs: No Transportation Needs (12/21/2022)   PRAPARE - Administrator, Civil Service (Medical): No    Lack of Transportation (Non-Medical): No  Physical Activity: Inactive (05/26/2022)   Exercise Vital Sign    Days of Exercise per Week: 0 days    Minutes of Exercise per Session: 0 min  Stress: Stress Concern  Present (05/26/2022)   [employer] of Occupational Health - Occupational Stress Questionnaire    Feeling of Stress : To some extent  Social Connections: Moderately Integrated (05/26/2022)   Social Connection and Isolation Panel [NHANES]    Frequency of Communication with Friends and Family: Once a week    Frequency of Social Gatherings with Friends and Family: Twice a week    Attends Religious Services: More than 4 times per year    Active Member of [employer] or Organizations: No    Attends Banker Meetings: Never    Marital Status: Married  Catering manager Violence: Not At Risk (05/26/2022)   Humiliation, Afraid, Rape, and Kick questionnaire    Fear of Current or Ex-Partner: No    Emotionally Abused: No    Physically Abused: No    Sexually Abused: No      Family History  Problem Relation Age of Onset   Stroke Mother    Dementia Mother    Hypertension Father    Diabetes Father    Hypertension Paternal Grandfather    Diabetes Paternal Grandfather     PHYSICAL EXAM: Vitals:   08/02/23 1055  BP: 116/68  Pulse: 96  SpO2: 97%   GENERAL: morbidly obese AAM HEENT: Negative for arcus senilis or xanthelasma. There is no scleral icterus.  The mucous membranes are pink and moist.   NECK: Supple,  No masses. Normal carotid upstrokes without bruits. No masses or thyromegaly.    CHEST: There are no chest wall deformities. There is no chest wall tenderness. Respirations are unlabored.  Lungs- decreased at bases CARDIAC:  JVP: difficult to assess due to body habitus         Normal rate with regular rhythm. No murmurs, rubs or gallops.  Pulses are 2+ and symmetrical in upper and lower extremities. 2+  edema to the knees.  ABDOMEN: Soft, non-tender, non-distended. There are no masses or hepatomegaly. There are normal bowel sounds.  EXTREMITIES: Warm and well perfused with no cyanosis, clubbing.  LYMPHATIC: No axillary or supraclavicular lymphadenopathy.  NEUROLOGIC: Patient is oriented x3 with no focal or lateralizing neurologic deficits.  PSYCH: Patients affect is appropriate, there is no evidence of anxiety or depression.  SKIN: Warm and dry; no lesions or wounds.     DATA REVIEW  ECG: 08/12/22: sinus tachycardia as per my personal interpretation  ECHO: 11/04/22: LVEF 20-25% with global hypokinesis  as per my personal interpretation 10/10/18 ([facility]): LVEF 20-25%, RV with mildly reduced function.   Based on [facility] records he had a stress MRI in 2011 with an LVEF of 35%.   CATH: 09/19/20:  Severely depressed overall left ventricular function EF less than 25% Severely enlarged left ventricular chamber Left main large free of disease LAD was large and free of disease Circumflex was large and free of disease left dominant Right heart cath showed no evidence of pulmonary hypertension Mean PA was 23 mean wedge of 10 Cardiac output of 7.6 Fick  RHC 03/16/23:  HEMODYNAMICS: RA:                  9 mmHg (mean) PA:                  64/26 mmHg (42 mean) PCWP:            25 with large V waves  Estimated Fick CO/CI   5 L/min, 1.9 L/min/m2                                                 TPG                 17  mmHg                                             PVR                 3.4 Wood Units  PAPi                4       IMPRESSION: Moderate to severely elevated pre and post capillary filling pressures.  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  Severely reduced cardiac output / index.   07/22/23:    FDG uptake findings are inconsistent with active myocardial inflammation/sarcoidosis.   FDG uptake was not observed. LV perfusion is abnormal. There is no evidence of ischemia. Defect 1: There is a medium defect with moderate reduction in uptake present in the apical to mid inferior location(s). FDG uptake was not present in the described defect segment(s). There is abnormal wall motion in the defect area. Consistent with artifact caused by subdiaphragmatic activity; Based on this study, cannot exclude small inferior infarct (less likely given history)   Left ventricular function is abnormal. Global function is severely reduced. EF: 22%. End diastolic cavity size is severely enlarged. End systolic cavity size is severely enlarged.  ASSESSMENT & PLAN:  Heart failure with reduced EF, Stage D systolic HF Etiology of ZO:XWRUEAVWUJW cardiomyopathy; LHC in 2022 with no significant CAD. Stress CMR in 2011, however, I cannot find the results. Hx of bicep tendon rupture; unable to obtain cardiac MRI, status post ICD placement.  LVEF 22% by cardiac PET NYHA class / AHA Stage:III, lost to follow up for several months. Functional class now NYHA III.  Volume status & Diuretics:  Hypervolemic on exam; will increase torsemide to 80mg  qAM and 40pm in the evenings for 1 week. Plan on close follow up next week for repeat labs and medication down-titration.  Vasodilators: continue entresto 49/51mg  BID Beta-Blocker: continue toprol 25mg  XL at night.  JXB:JYNWGNFA spironolactone 25mg  daily Cardiometabolic:continue farxiga 10mg  daily Devices therapies & Valvulopathies: s/p ICD placement  (5/24) by Dr. Lalla Brothers; admitted during that time due  to volume overload with frequent PVCs.  Advanced therapies: I believe he is having a continued decline in exercise capacity due to underlying HF. TTE from several months ago with dilated LV and normal RV function. He has excellent social support and decent insight if advanced therapies were required. Unfortunately, his BMI is too high for transplant at this time. Now started on Mounjaro. RHC w/ CI of 1.9. Undergoing LVAD/transplant evaluation.  08/02/23: Lost to follow up for several months during VAD evaluation; reports that he became overwhelmed. I told him he is not a candidate at this time due to obesity, LE foot wounds & COPD. We had a very long talk about this today. He will need to restart Monjouro, lose central adiposity and allow lower extremity wounds to heal before we can re-consider.  He has adequate social support with his wife and church group. Also has children. Will repeat TTE at 35m follow up.   2. Frequent PVCs - Continue PO amiodarone 200mg  daily.   3. Morbid obesity - Body mass index is 53.07 kg/m. - Unable to afford Monjouro currently.  - Discussed with pharmD today. Will look into alternatives and re-prescribe.   4.  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  - Refer to pulmonology today - I started him on Anoro previously; reports that symptoms have improved with inhaler use.  - Refer to pulmonology for further management.   5. Foot wounds - Seen by podiatry on 07/27/23 - Interdigital skin breakdown with drainage in bilateral feet.  - started on cipro x 14 days with gentamicin cream.   I spent 45 minutes caring for this patient today including face to face time, ordering and reviewing labs, reviewing records from Podiatry on 07/27/23 , seeing the patient, extensively discussing severity of his heart failure, 1 year prognosis and need for advanced therapies/weight loss/compliance, documenting in the record, and arranging follow ups.   Trang Bouse Advanced Heart Failure Mechanical Circulatory Support

## 2023-08-02 NOTE — Patient Instructions (Addendum)
Medication Changes:  INCREASE TORSEMIDE TO 80MG  IN THE MORNING AND 40MG  IN THE EVENING   PLEASE TAKE POTASSIUM (1) TABLET ONCE DAILY   Lab Work:  Go DOWN to LOWER LEVEL (LL) to have your blood work completed inside of [employer] office.  We will only call you if the results are abnormal or if the provider would like to make medication changes.  Follow-Up in: 1 WEEK AS SCHEDULED   THEN AGAIN IN 1 MONTH AS SCHEDULED   If you have any questions or concerns before your next appointment please send [place] a message through mychart or call our office at [phone] Monday-Friday 8 am-5 pm.   If you have an urgent need after hours on the weekend please call your Primary Cardiologist or the Advanced Heart Failure Clinic in [place] at [phone].   At the Advanced Heart Failure Clinic, you and your health needs are our priority. We have a designated team specialized in the treatment of Heart Failure. This Care Team includes your primary Heart Failure Specialized Cardiologist (physician), Advanced Practice Providers (APPs- Physician Assistants and Nurse Practitioners), and Pharmacist who all work together to provide you with the care you need, when you need it.   You may see any of the following providers on your designated Care Team at your next follow up:  Dr. Arvilla Meres Dr. Marca Ancona Dr. Dorthula Nettles Dr. Theresia Bough Tonye Becket, NP Robbie Lis, [place] [address] [place], [place] Brynda Peon, NP [place] Lee, NP Clarisa Kindred, NP Enos Fling, PharmD

## 2023-08-02 NOTE — Telephone Encounter (Signed)
Verified with pt that he did get the medication that was sent in on Friday as the office had closed when rx was sent in. He picked it up Friday and said thank you

## 2023-08-02 NOTE — Telephone Encounter (Signed)
Advanced Heart Failure Patient Advocate Encounter  The patient was approved for a [employer] grant that will help cover the cost of Digoxin, Entresto, Farxiga, Metoprolol, Spironolactone.  Total amount awarded, $10,000.  Effective: 07/03/2023 - 07/01/2024.  BIN [id number] PCN [id number] Group [id number] ID [id number]  Patient provided with approval and processing information in office.  Burnell Blanks, CPhT Rx Patient Advocate Phone: [phone]

## 2023-08-09 ENCOUNTER — Encounter: Payer: Medicare Other | Admitting: Cardiology

## 2023-08-13 ENCOUNTER — Ambulatory Visit (INDEPENDENT_AMBULATORY_CARE_PROVIDER_SITE_OTHER): Payer: Medicare Other

## 2023-08-13 ENCOUNTER — Encounter: Payer: Self-pay | Admitting: Podiatry

## 2023-08-13 ENCOUNTER — Telehealth: Payer: Self-pay | Admitting: Pharmacist

## 2023-08-13 ENCOUNTER — Ambulatory Visit: Payer: Medicare Other | Admitting: Podiatry

## 2023-08-13 VITALS — Ht 69.0 in | Wt 359.4 lb

## 2023-08-13 DIAGNOSIS — L03116 Cellulitis of left lower limb: Secondary | ICD-10-CM | POA: Diagnosis not present

## 2023-08-13 DIAGNOSIS — L02619 Cutaneous abscess of unspecified foot: Secondary | ICD-10-CM

## 2023-08-13 DIAGNOSIS — L03119 Cellulitis of unspecified part of limb: Secondary | ICD-10-CM

## 2023-08-13 DIAGNOSIS — M19072 Primary osteoarthritis, left ankle and foot: Secondary | ICD-10-CM | POA: Diagnosis not present

## 2023-08-13 MED ORDER — BETAMETHASONE SOD PHOS & ACET 6 (3-3) MG/ML IJ SUSP
3.0000 mg | Freq: Once | INTRAMUSCULAR | Status: AC
  Administered 2023-08-13: 3 mg via INTRA_ARTICULAR

## 2023-08-13 NOTE — Progress Notes (Signed)
Chief Complaint  Patient presents with   Wound Check    Pt is here to f/u on bilateral foot wound pt states the wounds are healing fine but has thick callouses that are growing over the wound, also complains of left ankle pain that started over 2 weeks ago no injury to foot/ankle.    HPI: 47 y.o. male presenting today for follow-up evaluation of severe tinea pedis to the right forefoot.  Patient has noted significant improvement.  He no longer has any burning or itching sensation and he says that the drainage and wounds are healing in between the toes.  Patient also developed some left ankle pain about 2-3 weeks ago.  Idiopathic onset.  He does have a history of gout.  Over the last 2-3 weeks there has been improvement.  Past Medical History:  Diagnosis Date   CHF (congestive heart failure) (HCC)    Hypertension     Past Surgical History:  Procedure Laterality Date   BICEPS TENDON REPAIR     COLONOSCOPY WITH PROPOFOL N/A 10/09/2020   Procedure: COLONOSCOPY WITH PROPOFOL;  Surgeon: [name], MD;  Location: [facility] ENDOSCOPY;  Service: Gastroenterology;  Laterality: N/A;   FLEXIBLE SIGMOIDOSCOPY N/A 10/30/2020   Procedure: FLEXIBLE SIGMOIDOSCOPY;  Surgeon: [name], MD;  Location: [facility] ENDOSCOPY;  Service: Gastroenterology;  Laterality: N/A;   FOOT SURGERY     metataral and fasciotomy   ICD IMPLANT N/A 12/16/2022   Procedure: ICD IMPLANT;  Surgeon: Lanier Prude, MD;  Location: [facility] INVASIVE CV LAB;  Service: Cardiovascular;  Laterality: N/A;   RIGHT HEART CATH N/A 03/16/2023   Procedure: RIGHT HEART CATH;  Surgeon: Dorthula Nettles, DO;  Location: [facility] INVASIVE CV LAB;  Service: Cardiovascular;  Laterality: N/A;   RIGHT/LEFT HEART CATH AND CORONARY ANGIOGRAPHY Bilateral 09/19/2020   Procedure: RIGHT/LEFT HEART CATH AND CORONARY ANGIOGRAPHY;  Surgeon: Alwyn Pea, MD;  Location: [facility] INVASIVE CV LAB;  Service: Cardiovascular;  Laterality: Bilateral;   ROTATOR  CUFF REPAIR      Allergies  Allergen Reactions   Geodon [Ziprasidone] Other (See Comments)    paralysis   Tramadol Other (See Comments)    Pt stated that it gave him sores.   Amoxicillin Rash   Zithromax [Azithromycin] Rash     RT foot 07/27/2023  Physical Exam: General: The patient is alert and oriented x3 in no acute distress.  Dermatology: The interdigital skin breakdown to the right foot appears mostly resolved.  No drainage noted.  There is some callus tissue to the area but no erythema or infection  Vascular: Palpable pedal pulses bilaterally. Capillary refill within normal limits.  No appreciable edema.  No erythema.  Neurological: Grossly intact via light touch  Musculoskeletal Exam: Pes planovalgus deformity noted with weightbearing bilateral.  There is pain and tenderness associated to the left ankle both the medial lateral aspect.  Radiographic exam LT ankle 08/13/2023: There is a moderate degenerative changes noted with spurring especially to the posterior aspect of the ankle.  No acute fractures identified.  Assessment/Plan of Care: 1.  Interdigital macerated fungal infection with superinfected bacterial infection 2.  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 -Also recommend wide fitting shoes that do not constrict the toebox area -Return to clinic as needed  Felecia Shelling, DPM [facility]  Dr. Felecia Shelling, DPM    [address] [place], [place] [address]                Office [phone]  Fax [phone]

## 2023-08-13 NOTE — Telephone Encounter (Signed)
Call to titrate Mounjaro dose, N/A LVM

## 2023-08-17 NOTE — Telephone Encounter (Signed)
 Spoke to patient, reports he was in coverage gap so could not afford Mounjaro  during last month of 2024. His last dose was 10 mg and he took his last injection end of November. He we will be calling his insurance and will find out how coverage/deductible will trend for him in 2025 and will call us  back early Jan   Due to therapy interruption plan is to restart Mounjaro  at lower dose but patient does not wanted to restart from step 1. He tolerated each dose titration well in the past based on that will restart Mounjaro  at 5 mg or 7.5 mg once week dose.

## 2023-09-13 ENCOUNTER — Telehealth: Payer: Self-pay | Admitting: Pharmacist

## 2023-09-13 ENCOUNTER — Ambulatory Visit: Payer: Medicare Other | Attending: Cardiology | Admitting: Cardiology

## 2023-09-13 ENCOUNTER — Other Ambulatory Visit (HOSPITAL_COMMUNITY): Payer: Self-pay

## 2023-09-13 VITALS — BP 98/68 | HR 85 | Wt 354.0 lb

## 2023-09-13 DIAGNOSIS — Z79899 Other long term (current) drug therapy: Secondary | ICD-10-CM

## 2023-09-13 DIAGNOSIS — I493 Ventricular premature depolarization: Secondary | ICD-10-CM

## 2023-09-13 DIAGNOSIS — I5022 Chronic systolic (congestive) heart failure: Secondary | ICD-10-CM | POA: Diagnosis not present

## 2023-09-13 DIAGNOSIS — J449 Chronic obstructive pulmonary disease, unspecified: Secondary | ICD-10-CM | POA: Diagnosis not present

## 2023-09-13 DIAGNOSIS — Z6841 Body Mass Index (BMI) 40.0 and over, adult: Secondary | ICD-10-CM | POA: Diagnosis not present

## 2023-09-13 DIAGNOSIS — Z5181 Encounter for therapeutic drug level monitoring: Secondary | ICD-10-CM

## 2023-09-13 MED ORDER — METOPROLOL SUCCINATE ER 50 MG PO TB24: 50.0000 mg | ORAL_TABLET | Freq: Every day | ORAL | 3 refills | Status: DC

## 2023-09-13 MED ORDER — ANORO ELLIPTA 62.5-25 MCG/ACT IN AEPB: 1.0000 | INHALATION_SPRAY | Freq: Every day | RESPIRATORY_TRACT | 1 refills | Status: DC

## 2023-09-13 NOTE — Progress Notes (Unsigned)
ADVANCED HEART FAILURE CLINIC NOTE  Referring Physician: [name], NP  Primary Care: [name], NP  HPI: Marc [name] is a 48 y.o. male with HFrEF 2/2 NICM, OSA on CPAP, hx of tobacco use, morbid obesity presenting today to establish care. Marc [name] reports first being diagnosed with systolic heart failure around 4098 at [facility]. He was diagnosed with the flu around that time followed by persistent SOB and diagnosis of nonischemic cardiomyopathy. 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 He was followed for several years at [facility] with LVEF ranging from 25-35%. He most recently presented to the [facility] ER in December 2023 with complaints of chest discomfort. At that time he was referred to HF clinic for further evaluation.  He had an echocardiogram in June 2023 demonstrating an LVEF of 25% and a repeat echo today w/ persistently reduced function.  He is now s/p ICD implant in 12/2022. Briefly required admission afterwards due to volume overload.   Interval hx:  -Lower extremity wound has now healed completely.  -He has unfortunately been able to afford Mounjoro.  -He reports that his shortness of breath has been doing much better.   Activity level/exercise tolerance:  NYHA III; also limited by underlying sciatica.  Orthopnea:  Sleeps on 3 pillows Paroxysmal noctural dyspnea:  No Chest pain/pressure:  No Orthostatic lightheadedness:  No Palpitations:  No Lower extremity edema:  yes, 2-3+ to the knees.  Presyncope/syncope:  No Cough:  No  Past Medical History:  Diagnosis Date   CHF (congestive heart failure) (HCC)    Hypertension     Current Outpatient Medications  Medication Sig Dispense Refill   albuterol (VENTOLIN HFA) 108 (90 Base) MCG/ACT inhaler Inhale 2 puffs into the lungs every 6 (six) hours as needed for wheezing or shortness of breath. 18 g 0   amiodarone (PACERONE) 200 MG tablet Take 2  tablets (400 mg total) by mouth daily. PLEASE CALL OFFICE TO SCHEDULE APPOINTMENT PRIOR TO NEXT REFILL 180 tablet 0   aspirin EC 81 MG tablet Take 81 mg by mouth daily.     cyclobenzaprine (FLEXERIL) 10 MG tablet TAKE 1 TABLET BY MOUTH EVERY 8 HOURS AS NEEDED FOR MUSCLE SPASM 30 tablet 0   dapagliflozin propanediol (FARXIGA) 10 MG TABS tablet Take 1 tablet (10 mg total) by mouth daily before breakfast. 90 tablet 3   digoxin (LANOXIN) 0.125 MG tablet Take 1 tablet (0.125 mg total) by mouth daily. 90 tablet 3   docusate sodium (COLACE) 100 MG capsule Take 100 mg by mouth daily as needed for mild constipation.     hydrocortisone (ANUSOL-HC) 2.5 % rectal cream Place 1 Application rectally 2 (two) times daily. (Patient taking differently: Place 1 Application rectally 2 (two) times daily as needed for anal itching or hemorrhoids.) 30 g 0   metoprolol succinate (TOPROL XL) 25 MG 24 hr tablet Take 1 tablet (25 mg total) by mouth at bedtime. 90 tablet 3   omeprazole (PRILOSEC) 20 MG capsule Take 20 mg by mouth daily.     potassium chloride SA (KLOR-CON M) 20 MEQ tablet Take 1 tablet (20 mEq total) by mouth daily. 30 tablet 3   rosuvastatin (CRESTOR) 5 MG tablet Take 1 tablet (5 mg total) by mouth daily. 90 tablet 1   sacubitril-valsartan (ENTRESTO) 49-51 MG Take 1 tablet by mouth 2 (two) times daily. 180 tablet 3   spironolactone (ALDACTONE) 25 MG tablet Take 1 tablet (25 mg  total) by mouth daily. 100 tablet 2   tirzepatide (MOUNJARO) 12.5 MG/0.5ML Pen Inject 12.5 mg into the skin once a week. 2 mL 0   torsemide (DEMADEX) 20 MG tablet TAKE 80MG  IN THE MORNING AND 40MG  IN THE EVENING 180 tablet 3   triamcinolone cream (KENALOG) 0.1 % Apply 1 Application topically 2 (two) times daily. 30 g 0   umeclidinium-vilanterol (ANORO ELLIPTA) 62.5-25 MCG/ACT AEPB Inhale 1 puff into the lungs daily. 1 each 1   No current facility-administered medications for this visit.    Allergies  Allergen Reactions   Geodon  [Ziprasidone] Other (See Comments)    paralysis   Tramadol Other (See Comments)    Pt stated that it gave him sores.   Amoxicillin Rash   Zithromax [Azithromycin] Rash      Social History   Socioeconomic History   Marital status: Married    Spouse name: Not on file   Number of children: Not on file   Years of education: Not on file   Highest education level: Not on file  Occupational History   Not on file  Tobacco Use   Smoking status: Every Day    Current packs/day: 0.50    Average packs/day: 0.5 packs/day for 25.0 years (12.5 ttl pk-yrs)    Types: Cigarettes   Smokeless tobacco: Never  Vaping Use   Vaping status: Never Used  Substance and Sexual Activity   Alcohol use: Not Currently   Drug use: Not Currently   Sexual activity: Yes  Other Topics Concern   Not on file  Social History Narrative   Wife, Marc [name], at bedside.   Social Drivers of Corporate investment banker Strain: Low Risk  (05/26/2022)   Overall Financial Resource Strain (CARDIA)    Difficulty of Paying Living Expenses: Not hard at all  Food Insecurity: No Food Insecurity (12/21/2022)   Hunger Vital Sign    Worried About Running Out of Food in the Last Year: Never true    Ran Out of Food in the Last Year: Never true  Transportation Needs: No Transportation Needs (12/21/2022)   PRAPARE - Administrator, Civil Service (Medical): No    Lack of Transportation (Non-Medical): No  Physical Activity: Inactive (05/26/2022)   Exercise Vital Sign    Days of Exercise per Week: 0 days    Minutes of Exercise per Session: 0 min  Stress: Stress Concern Present (05/26/2022)   [employer] of Occupational Health - Occupational Stress Questionnaire    Feeling of Stress : To some extent  Social Connections: Moderately Integrated (05/26/2022)   Social Connection and Isolation Panel [NHANES]    Frequency of Communication with Friends and Family: Once a week    Frequency of Social Gatherings with Friends  and Family: Twice a week    Attends Religious Services: More than 4 times per year    Active Member of [employer] or Organizations: No    Attends Banker Meetings: Never    Marital Status: Married  Catering manager Violence: Not At Risk (05/26/2022)   Humiliation, Afraid, Rape, and Kick questionnaire    Fear of Current or Ex-Partner: No    Emotionally Abused: No    Physically Abused: No    Sexually Abused: No      Family History  Problem Relation Age of Onset   Stroke Mother    Dementia Mother    Hypertension Father    Diabetes Father    Hypertension Paternal Actor  Diabetes Paternal Grandfather     PHYSICAL EXAM: Vitals:   09/13/23 1043  BP: 98/68  Pulse: 85  SpO2: 98%   GENERAL: morbidly obese Marc [name] HEENT: Negative for arcus senilis or xanthelasma. There is no scleral icterus.  The mucous membranes are pink and moist.   NECK: Supple, No masses. Normal carotid upstrokes without bruits. No masses or thyromegaly.    CHEST: There are no chest wall deformities. There is no chest wall tenderness. Respirations are unlabored.  Lungs- decreased at bases CARDIAC:  JVP: difficult to assess due to body habitus         Normal rate with regular rhythm. No murmurs, rubs or gallops.  Pulses are 2+ and symmetrical in upper and lower extremities. 2+  edema to the knees.  ABDOMEN: Soft, non-tender, non-distended. There are no masses or hepatomegaly. There are normal bowel sounds.  EXTREMITIES: Warm and well perfused with no cyanosis, clubbing.  LYMPHATIC: No axillary or supraclavicular lymphadenopathy.  NEUROLOGIC: Patient is oriented x3 with no focal or lateralizing neurologic deficits.  PSYCH: Patients affect is appropriate, there is no evidence of anxiety or depression.  SKIN: Warm and dry; no lesions or wounds.     DATA REVIEW  ECG: 08/12/22: sinus tachycardia as per my personal interpretation  ECHO: 11/04/22: LVEF 20-25% with global hypokinesis  as per my  personal interpretation 10/10/18 ([facility]): LVEF 20-25%, RV with mildly reduced function.   Based on [facility] records he had a stress MRI in 2011 with an LVEF of 35%.   CATH: 09/19/20:  Severely depressed overall left ventricular function EF less than 25% Severely enlarged left ventricular chamber Left main large free of disease LAD was large and free of disease Circumflex was large and free of disease left dominant Right heart cath showed no evidence of pulmonary hypertension Mean PA was 23 mean wedge of 10 Cardiac output of 7.6 Fick  RHC 03/16/23:  HEMODYNAMICS: RA:                  9 mmHg (mean) PA:                  64/26 mmHg (42 mean) PCWP:            25 with large V waves                                      Estimated Fick CO/CI   5 L/min, 1.9 L/min/m2                                                 TPG                 17  mmHg                                            PVR                 3.4 Wood Units  PAPi                4       IMPRESSION: Moderate to severely elevated pre and post capillary filling  pressures.  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  Severely reduced cardiac output / index.   07/22/23:    FDG uptake findings are inconsistent with active myocardial inflammation/sarcoidosis.   FDG uptake was not observed. LV perfusion is abnormal. There is no evidence of ischemia. Defect 1: There is a medium defect with moderate reduction in uptake present in the apical to mid inferior location(s). FDG uptake was not present in the described defect segment(s). There is abnormal wall motion in the defect area. Consistent with artifact caused by subdiaphragmatic activity; Based on this study, cannot exclude small inferior infarct (less likely given history)   Left ventricular function is abnormal. Global function is severely reduced. EF: 22%. End diastolic cavity size is severely enlarged. End systolic cavity size is severely enlarged.  ASSESSMENT &  PLAN:  Heart failure with reduced EF, Stage D, End-stage systolic HF Etiology of XB:JYNWGNFAOZH cardiomyopathy; LHC in 2022 with no significant CAD. Stress CMR in 2011, however, I cannot find the results. Hx of bicep tendon rupture; unable to obtain cardiac MRI, status post ICD placement.  LVEF 22% by cardiac PET NYHA class / AHA Stage:III, lost to follow up for several months. Functional class now NYHA III.  Volume status & Diuretics:  Euvolemic on exam today; torsemide 80mg  to 40mg  in the morning depending on his daily weights. Takes 60mg  most days.  Vasodilators: continue entresto 49/51mg  BID Beta-Blocker: increase metoprolol to 50mg  daily YQM:VHQIONGE spironolactone 25mg  daily Cardiometabolic:continue farxiga 10mg  daily Devices therapies & Valvulopathies: s/p ICD placement  (5/24) by Dr. Lalla Brothers Advanced therapies: I believe he is having a continued decline in exercise capacity due to underlying HF. TTE from several months ago with dilated LV and normal RV function. He has excellent social support and decent insight if advanced therapies were required. Unfortunately, his BMI is too high for transplant at this time. Now started on Mounjaro. RHC w/ CI of 1.9. Undergoing LVAD/transplant evaluation.  08/02/23: Lost to follow up for several months during VAD evaluation; reports that he became overwhelmed. I told him he is not a candidate at this time due to obesity, LE foot wounds & COPD. We had a very long talk about this today. He will need to restart Monjouro, lose central adiposity and allow lower extremity wounds to heal before we can re-consider. He has adequate social support with his wife and church group. Also has children.  09/12/22:  Lower extremity wounds have healed; NYHA III symptoms. Unable to afford Mounjoro. Weight remains a significant limitation to advanced therapies.   2. Frequent PVCs - Continue PO amiodarone 200mg  daily.   3. Morbid obesity - Body mass index is 52.28 kg/m. -  We reviewed his Mounjoro pricing together today; 194$ for 10mg  dosing. Reaching out to our pharmD to find cheaper alternatives. Weight loss is crucial for him.   4.  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  - Reports that Anoro has helped his breathing; will refill today.  - Refer to pulmonology again.   5. Foot wounds - Seen by podiatry on 07/27/23 - Interdigital skin breakdown with drainage in bilateral feet.  - It is now resolved.   I spent 40 minutes caring for this patient today including face to face time, ordering and reviewing labs, discussing advanced therapies at length, counseling on weight loss extensively,, seeing the patient, documenting in the record, and arranging follow ups.    Kaisen Ackers Advanced Heart Failure Mechanical Circulatory Support

## 2023-09-13 NOTE — Patient Instructions (Signed)
Medication Changes:  INCREASE METOPROLOL SUCCINATE TO 50MG  ONCE DAILY   Lab Work:  Go DOWN to LOWER LEVEL (LL) to have your blood work completed inside of [employer] office.  We will only call you if the results are abnormal or if the provider would like to make medication changes.  Testing/Procedures:  ECHO AS SCHEDULED HERE AT [facility]   Referrals:  YOU HAVE BEEN REFERRED TO PULMONOLOGY THEY WILL REACH OUT TO YOU OR CALL TO ARRANGE THIS. PLEASE CALL us WITH ANY CONCERNS   Follow-Up in: 1 MONTH AS SCHEDULED   If you have any questions or concerns before your next appointment please send [place] a message through mychart or call our office at [phone] Monday-Friday 8 am-5 pm.   If you have an urgent need after hours on the weekend please call your Primary Cardiologist or the Advanced Heart Failure Clinic in [place] at [phone].   At the Advanced Heart Failure Clinic, you and your health needs are our priority. We have a designated team specialized in the treatment of Heart Failure. This Care Team includes your primary Heart Failure Specialized Cardiologist (physician), Advanced Practice Providers (APPs- Physician Assistants and Nurse Practitioners), and Pharmacist who all work together to provide you with the care you need, when you need it.   You may see any of the following providers on your designated Care Team at your next follow up:  Dr. Arvilla Meres Dr. Marca Ancona Dr. Dorthula Nettles Dr. Theresia Bough Tonye Becket, NP Robbie Lis, [place] [address] [place], [place] Brynda Peon, NP [place] Lee, NP Clarisa Kindred, NP Enos Fling, PharmD

## 2023-09-14 ENCOUNTER — Other Ambulatory Visit (HOSPITAL_COMMUNITY): Payer: Self-pay

## 2023-09-14 ENCOUNTER — Telehealth: Payer: Self-pay | Admitting: Pharmacy Technician

## 2023-09-14 LAB — BASIC METABOLIC PANEL
BUN/Creatinine Ratio: 12 (ref 9–20)
BUN: 15 mg/dL (ref 6–24)
CO2: 19 mmol/L — ABNORMAL LOW (ref 20–29)
Calcium: 9.1 mg/dL (ref 8.7–10.2)
Chloride: 102 mmol/L (ref 96–106)
Creatinine, Ser: 1.21 mg/dL (ref 0.76–1.27)
Glucose: 84 mg/dL (ref 70–99)
Potassium: 4.3 mmol/L (ref 3.5–5.2)
Sodium: 139 mmol/L (ref 134–144)
eGFR: 73 mL/min/{1.73_m2} (ref >59)

## 2023-09-14 LAB — HEMOGLOBIN A1C
Est. average glucose Bld gHb Est-mCnc: 128 mg/dL
Hgb A1c MFr Bld: 6.1 % — ABNORMAL HIGH (ref 4.8–5.6)

## 2023-09-14 LAB — BRAIN NATRIURETIC PEPTIDE: BNP: 47.3 pg/mL (ref 0.0–100.0)

## 2023-09-14 LAB — DIGOXIN LEVEL: Digoxin, Serum: <0.4 ng/mL — ABNORMAL LOW (ref 0.5–0.9)

## 2023-09-14 NOTE — Telephone Encounter (Signed)
Pharmacy Patient Advocate Encounter   Received notification from Pt Calls Messages that prior authorization for mounjaro is required/requested.   Insurance verification completed.   The patient is insured through [facility] .   Per test claim: PA required; PA submitted to above mentioned insurance via CoverMyMeds Key/confirmation #/EOC [id number] Status is pending

## 2023-09-15 ENCOUNTER — Other Ambulatory Visit (HOSPITAL_COMMUNITY): Payer: Self-pay

## 2023-09-15 NOTE — Telephone Encounter (Signed)
Pharmacy Patient Advocate Encounter  Received notification from [facility] that Prior Authorization for mounjaro has been APPROVED from 09/14/23 to 08/16/24  $47.00 one month   PA #/Case ID/Reference #: [id number]

## 2023-09-16 ENCOUNTER — Ambulatory Visit: Payer: Medicare Other | Admitting: Emergency Medicine

## 2023-09-16 ENCOUNTER — Ambulatory Visit: Payer: Medicare Other

## 2023-09-16 VITALS — Ht 69.0 in | Wt 356.0 lb

## 2023-09-16 DIAGNOSIS — I428 Other cardiomyopathies: Secondary | ICD-10-CM

## 2023-09-16 DIAGNOSIS — Z0000 Encounter for general adult medical examination without abnormal findings: Secondary | ICD-10-CM | POA: Diagnosis not present

## 2023-09-16 LAB — CUP PACEART REMOTE DEVICE CHECK
Battery Remaining Longevity: 158 mo
Battery Voltage: 3.04 V
Brady Statistic RV Percent Paced: 0.01 %
Date Time Interrogation Session: 20250129214434
HighPow Impedance: 81 Ohm
Implantable Lead Connection Status: 753985
Implantable Lead Implant Date: 20240501
Implantable Lead Location: 753860
Implantable Pulse Generator Implant Date: 20240501
Lead Channel Impedance Value: 266 Ohm
Lead Channel Impedance Value: 342 Ohm
Lead Channel Pacing Threshold Amplitude: 1.125 V
Lead Channel Pacing Threshold Pulse Width: 0.4 ms
Lead Channel Sensing Intrinsic Amplitude: 3.9 mV
Lead Channel Setting Pacing Amplitude: 2 V
Lead Channel Setting Pacing Pulse Width: 0.4 ms
Lead Channel Setting Sensing Sensitivity: 0.3 mV
Zone Setting Status: 755011

## 2023-09-16 NOTE — Patient Instructions (Addendum)
Marc [name] , Thank you for taking time to come for your Medicare Wellness Visit. I appreciate your ongoing commitment to your health goals. Please review the following plan we discussed and let me know if I can assist you in the future.   Referrals/Orders/Follow-Ups/Clinician Recommendations: Keep working on diet and exercise and reduce or quit smoking.  This is a list of the screening recommended for you and due dates:  Health Maintenance  Topic Date Due   COVID-19 Vaccine (2 - Pfizer risk series) 06/22/2023   DTaP/Tdap/Td vaccine (1 - Tdap) 05/30/2024*   Yearly kidney health urinalysis for diabetes  06/30/2024   Yearly kidney function blood test for diabetes  09/12/2024   Medicare Annual Wellness Visit  09/15/2024   Cologuard (Stool DNA test)  06/14/2026   Pneumococcal Vaccination  Completed   Flu Shot  Completed   Hepatitis C Screening  Completed   HIV Screening  Completed   HPV Vaccine  Aged Out   Colon Cancer Screening  Discontinued  *Topic was postponed. The date shown is not the original due date.    Advanced directives: (ACP Link)Information on Advanced Care Planning can be found at [facility] Advance Health Care Directives Advance Health Care Directives ([email or web address]   Once you have completed the forms, please bring a copy of your health care power of attorney and living will to the office to be added to your chart at your convenience.    Next Medicare Annual Wellness Visit scheduled for next year: Yes, 09/28/24 @ 1:10pm (video visit)

## 2023-09-16 NOTE — Progress Notes (Signed)
Subjective:   Marc [name] is a 48 y.o. male who presents for Medicare Annual/Subsequent preventive examination.  This patient declined Interactive audio and Acupuncturist. Therefore the visit was completed with audio only.   Visit Complete: Virtual I connected with  Marc [name] on 09/16/23 by a audio enabled telemedicine application and verified that I am speaking with the correct person using two identifiers.  Patient Location: Home  Provider Location: Home Office  I discussed the limitations of evaluation and management by telemedicine. The patient expressed understanding and agreed to proceed.  Vital Signs: Because this visit was a virtual/telehealth visit, some criteria may be missing or patient reported. Any vitals not documented were not able to be obtained and vitals that have been documented are patient reported.  Patient Medicare AWV questionnaire was completed by the patient on 09/16/23; I have confirmed that all information answered by patient is correct and no changes since this date.  Cardiac Risk Factors include: male gender;hypertension;obesity (BMI >30kg/m2);smoking/ tobacco exposure;Other (see comment), Risk factor comments: OSA (cpap), prediabetic     Objective:    Today's Vitals   09/16/23 1304  Weight: (!) 356 lb (161.5 kg)  Height: 5\' 9"  (1.753 m)   Body mass index is 52.57 kg/m.     09/16/2023    1:22 PM 03/16/2023    5:56 AM 12/16/2022    7:05 AM 08/05/2022    2:43 PM 05/26/2022   12:02 PM 10/09/2021    8:11 AM 10/30/2020    9:10 AM  Advanced Directives  Does Patient Have a Medical Advance Directive? No No No No No No No  Would patient like information on creating a medical advance directive? Yes (MAU/Ambulatory/Procedural Areas - Information given) Yes (MAU/Ambulatory/Procedural Areas - Information given) No - Patient declined  No - Patient declined No - Patient declined No - Patient declined    Current Medications  (verified) Outpatient Encounter Medications as of 09/16/2023  Medication Sig   albuterol (VENTOLIN HFA) 108 (90 Base) MCG/ACT inhaler Inhale 2 puffs into the lungs every 6 (six) hours as needed for wheezing or shortness of breath.   amiodarone (PACERONE) 200 MG tablet Take 2 tablets (400 mg total) by mouth daily. PLEASE CALL OFFICE TO SCHEDULE APPOINTMENT PRIOR TO NEXT REFILL   aspirin EC 81 MG tablet Take 81 mg by mouth daily.   cyclobenzaprine (FLEXERIL) 10 MG tablet TAKE 1 TABLET BY MOUTH EVERY 8 HOURS AS NEEDED FOR MUSCLE SPASM   dapagliflozin propanediol (FARXIGA) 10 MG TABS tablet Take 1 tablet (10 mg total) by mouth daily before breakfast.   digoxin (LANOXIN) 0.125 MG tablet Take 1 tablet (0.125 mg total) by mouth daily.   docusate sodium (COLACE) 100 MG capsule Take 100 mg by mouth daily as needed for mild constipation.   hydrocortisone (ANUSOL-HC) 2.5 % rectal cream Place 1 Application rectally 2 (two) times daily. (Patient taking differently: Place 1 Application rectally 2 (two) times daily as needed for anal itching or hemorrhoids.)   metoprolol succinate (TOPROL XL) 50 MG 24 hr tablet Take 1 tablet (50 mg total) by mouth at bedtime.   omeprazole (PRILOSEC) 20 MG capsule Take 20 mg by mouth daily.   potassium chloride SA (KLOR-CON M) 20 MEQ tablet Take 1 tablet (20 mEq total) by mouth daily.   rosuvastatin (CRESTOR) 5 MG tablet Take 1 tablet (5 mg total) by mouth daily.   sacubitril-valsartan (ENTRESTO) 49-51 MG Take 1 tablet by mouth 2 (two) times daily.   spironolactone (ALDACTONE)  25 MG tablet Take 1 tablet (25 mg total) by mouth daily.   torsemide (DEMADEX) 20 MG tablet TAKE 80MG  IN THE MORNING AND 40MG  IN THE EVENING   triamcinolone cream (KENALOG) 0.1 % Apply 1 Application topically 2 (two) times daily.   umeclidinium-vilanterol (ANORO ELLIPTA) 62.5-25 MCG/ACT AEPB Inhale 1 puff into the lungs daily.   tirzepatide (MOUNJARO) 12.5 MG/0.5ML Pen Inject 12.5 mg into the skin once a  week. (Patient not taking: Reported on 09/16/2023)   No facility-administered encounter medications on file as of 09/16/2023.    Allergies (verified) Geodon [ziprasidone], Tramadol, Amoxicillin, and Zithromax [azithromycin]   History: Past Medical History:  Diagnosis Date   CHF (congestive heart failure) (HCC)    Hypertension    Past Surgical History:  Procedure Laterality Date   BICEPS TENDON REPAIR     COLONOSCOPY WITH PROPOFOL N/A 10/09/2020   Procedure: COLONOSCOPY WITH PROPOFOL;  Surgeon: [name], MD;  Location: [facility] ENDOSCOPY;  Service: Gastroenterology;  Laterality: N/A;   FLEXIBLE SIGMOIDOSCOPY N/A 10/30/2020   Procedure: FLEXIBLE SIGMOIDOSCOPY;  Surgeon: [name], MD;  Location: [facility] ENDOSCOPY;  Service: Gastroenterology;  Laterality: N/A;   FOOT SURGERY     metataral and fasciotomy   ICD IMPLANT N/A 12/16/2022   Procedure: ICD IMPLANT;  Surgeon: Lanier Prude, MD;  Location: [facility] INVASIVE CV LAB;  Service: Cardiovascular;  Laterality: N/A;   RIGHT HEART CATH N/A 03/16/2023   Procedure: RIGHT HEART CATH;  Surgeon: Dorthula Nettles, DO;  Location: [facility] INVASIVE CV LAB;  Service: Cardiovascular;  Laterality: N/A;   RIGHT/LEFT HEART CATH AND CORONARY ANGIOGRAPHY Bilateral 09/19/2020   Procedure: RIGHT/LEFT HEART CATH AND CORONARY ANGIOGRAPHY;  Surgeon: Alwyn Pea, MD;  Location: [facility] INVASIVE CV LAB;  Service: Cardiovascular;  Laterality: Bilateral;   ROTATOR CUFF REPAIR     Family History  Problem Relation Age of Onset   Stroke Mother    Dementia Mother    Hypertension Father    Diabetes Father    Hypertension Paternal Grandfather    Diabetes Paternal Grandfather    Social History   Socioeconomic History   Marital status: Married    Spouse name: Voncylla   Number of children: 1   Years of education: Not on file   Highest education level: Associate degree: occupational, Scientist, product/process development, or vocational program  Occupational History    Occupation: disability  Tobacco Use   Smoking status: Every Day    Current packs/day: 0.50    Average packs/day: 0.5 packs/day for 31.1 years (15.5 ttl pk-yrs)    Types: Cigarettes    Start date: 1994   Smokeless tobacco: Never  Vaping Use   Vaping status: Never Used  Substance and Sexual Activity   Alcohol use: Not Currently   Drug use: Not Currently   Sexual activity: Yes  Other Topics Concern   Not on file  Social History Narrative   Wife, Waverly, at bedside.   1 son lives in [place] and 2 step children   Social Drivers of Health   Financial Resource Strain: Low Risk  (09/16/2023)   Overall Financial Resource Strain (CARDIA)    Difficulty of Paying Living Expenses: Not very hard  Food Insecurity: No Food Insecurity (09/16/2023)   Hunger Vital Sign    Worried About Running Out of Food in the Last Year: Never true    Ran Out of Food in the Last Year: Never true  Transportation Needs: No Transportation Needs (09/16/2023)   PRAPARE - Transportation  Lack of Transportation (Medical): No    Lack of Transportation (Non-Medical): No  Physical Activity: Inactive (09/16/2023)   Exercise Vital Sign    Days of Exercise per Week: 0 days    Minutes of Exercise per Session: 0 min  Stress: Stress Concern Present (09/16/2023)   [employer] of Occupational Health - Occupational Stress Questionnaire    Feeling of Stress : To some extent  Social Connections: Socially Integrated (09/16/2023)   Social Connection and Isolation Panel [NHANES]    Frequency of Communication with Friends and Family: Once a week    Frequency of Social Gatherings with Friends and Family: More than three times a week    Attends Religious Services: More than 4 times per year    Active Member of [employer] or Organizations: Yes    Attends Engineer, structural: More than 4 times per year    Marital Status: Married    Tobacco Counseling Ready to quit: Not Answered Counseling given: Not  Answered   Clinical Intake:  Pre-visit preparation completed: Yes  Pain : 0-10 Pain Type: Chronic pain Pain Location: Knee Pain Orientation: Left Pain Descriptors / Indicators: Aching     BMI - recorded: 52.57 Nutritional Status: BMI > 30  Obese Nutritional Risks: None Diabetes: No  How often do you need to have someone help you when you read instructions, pamphlets, or other written materials from your doctor or pharmacy?: 1 - Never  Interpreter Needed?: No  Information entered by :: Tora Kindred, CMA   Activities of Daily Living    09/16/2023    1:06 PM 09/16/2023    1:00 PM  In your present state of health, do you have any difficulty performing the following activities:  Hearing? 0 0  Vision? 0 0  Difficulty concentrating or making decisions? 0 0  Walking or climbing stairs? 1 1  Comment uses cane prn   Dressing or bathing? 0 0  Doing errands, shopping? 0 0  Preparing Food and eating ? N N  Using the Toilet? N N  In the past six months, have you accidently leaked urine? Y Y  Comment due to diuretics   Do you have problems with loss of bowel control? N N  Managing your Medications? N N  Managing your Finances? N N  Housekeeping or managing your Housekeeping? Malvin Johns  Comment family members help with housekeeping     Patient Care Team: [name], NP as PCP - General (Nurse Practitioner) Lanier Prude, MD as PCP - Electrophysiology (Cardiology)  Indicate any recent Medical Services you may have received from other than [facility] providers in the past year (date may be approximate).     Assessment:   This is a routine wellness examination for Zakery.  Hearing/Vision screen Hearing Screening - Comments:: Denies hearing loss Vision Screening - Comments:: Gets eye exams, [facility]   Goals Addressed             This Visit's Progress    Patient Stated       Try to start exercising      Depression Screen    09/16/2023    1:17  PM 05/31/2023    2:46 PM 05/26/2022   12:07 PM 05/25/2022    8:30 AM 02/20/2022    9:00 AM 01/21/2022    9:01 AM 11/17/2021   11:02 AM  PHQ 2/9 Scores  PHQ - 2 Score 2 1 1 1  0 0 1  PHQ- 9 Score 4 5  3    8    Fall Risk    09/16/2023    1:24 PM 09/16/2023    1:00 PM 05/31/2023    2:46 PM 05/26/2022   12:02 PM 05/25/2022    8:28 AM  Fall Risk   Falls in the past year? 0 0 0 1 0  Number falls in past yr: 0 0 0 1 0  Injury with Fall? 0 0 0 1 0  Risk for fall due to : No Fall Risks  No Fall Risks Impaired balance/gait No Fall Risks  Follow up Falls prevention discussed  Falls evaluation completed Falls evaluation completed;Education provided;Falls prevention discussed Falls evaluation completed    MEDICARE RISK AT HOME: Medicare Risk at Home Any stairs in or around the home?: Yes If so, are there any without handrails?: No Home free of loose throw rugs in walkways, pet beds, electrical cords, etc?: Yes Adequate lighting in your home to reduce risk of falls?: Yes Life alert?: No Use of a cane, walker or w/c?: Yes (cane prn) Grab bars in the bathroom?: No Shower chair or bench in shower?: No Elevated toilet seat or a handicapped toilet?: Yes  TIMED UP AND GO:  Was the test performed?  No    Cognitive Function:    04/28/2021    5:32 PM  MMSE - Mini Mental State Exam  Orientation to time 5  Orientation to Place 5  Registration 3  Attention/ Calculation 5  Recall 3  Language- name 2 objects 2  Language- repeat 1  Language- follow 3 step command 3  Language- read & follow direction 1  Write a sentence 1  Copy design 1  Total score 30        09/16/2023    1:25 PM 05/26/2022   12:04 PM  6CIT Screen  What Year? 0 points 0 points  What month? 0 points 0 points  What time? 0 points 0 points  Count back from 20 0 points 0 points  Months in reverse 0 points 0 points  Repeat phrase 0 points 0 points  Total Score 0 points 0 points    Immunizations Immunization History   Administered Date(s) Administered   Influenza, Seasonal, Injecte, Preservative Fre 07/10/2014, 06/01/2023   Influenza,inj,Quad PF,6+ Mos 10/10/2018, 04/24/2021   Influenza-Unspecified 07/29/2011   PNEUMOCOCCAL CONJUGATE-20 10/20/2022   Pfizer(Comirnaty)Fall Seasonal Vaccine 12 years and older 06/01/2023   Pneumococcal Polysaccharide-23 08/17/2018    TDAP status: Up to date per patient, done at [facility]  Flu Vaccine status: Up to date  Pneumococcal vaccine status: Up to date  Covid-19 vaccine status: Information provided on how to obtain vaccines.   Qualifies for Shingles Vaccine? No   Zostavax completed No   Shingrix Completed?: No.    Education has been provided regarding the importance of this vaccine. Patient has been advised to call insurance company to determine out of pocket expense if they have not yet received this vaccine. Advised may also receive vaccine at local pharmacy or Health Dept. Verbalized acceptance and understanding.  Screening Tests Health Maintenance  Topic Date Due   COVID-19 Vaccine (2 - Pfizer risk series) 06/22/2023   DTaP/Tdap/Td (1 - Tdap) 05/30/2024 (Originally [birth year]/1993)   Diabetic kidney evaluation - Urine ACR  06/30/2024   Diabetic kidney evaluation - eGFR measurement  09/12/2024   Medicare Annual Wellness (AWV)  09/15/2024   Fecal DNA (Cologuard)  06/14/2026   Pneumococcal Vaccine 71-37 Years old  Completed   INFLUENZA VACCINE  Completed  Hepatitis C Screening  Completed   HIV Screening  Completed   HPV VACCINES  Aged Out   Colonoscopy  Discontinued    Health Maintenance  Health Maintenance Due  Topic Date Due   COVID-19 Vaccine (2 - Pfizer risk series) 06/22/2023    Colorectal cancer screening: Type of screening: Cologuard. Completed 06/15/23. Repeat every 3 years  Lung Cancer Screening: (Low Dose CT Chest recommended if Age 31-80 years, 20 pack-year currently smoking OR have quit w/in 15years.) does not qualify.   Lung Cancer  Screening Referral: n/a  Additional Screening:  Hepatitis C Screening: does not qualify; Completed 04/24/21  Vision Screening: Recommended annual ophthalmology exams for early detection of glaucoma and other disorders of the eye.  Dental Screening: Recommended annual dental exams for proper oral hygiene    Community Resource Referral / Chronic Care Management: CRR required this visit?  No   CCM required this visit?  No     Plan:     I have personally reviewed and noted the following in the patient's chart:   Medical and social history Use of alcohol, tobacco or illicit drugs  Current medications and supplements including opioid prescriptions. Patient is not currently taking opioid prescriptions. Functional ability and status Nutritional status Physical activity Advanced directives List of other physicians Hospitalizations, surgeries, and ER visits in previous 12 months Vitals Screenings to include cognitive, depression, and falls Referrals and appointments  In addition, I have reviewed and discussed with patient certain preventive protocols, quality metrics, and best practice recommendations. A written personalized care plan for preventive services as well as general preventive health recommendations were provided to patient.     Tora Kindred, CMA   09/16/2023   After Visit Summary: (MyChart) Due to this being a telephonic visit, the after visit summary with patients personalized plan was offered to patient via MyChart   Nurse Notes:  Patient states he had a tetanus shot at [facility] in the last 2-3 years.

## 2023-09-19 ENCOUNTER — Encounter: Payer: Self-pay | Admitting: Cardiology

## 2023-09-20 NOTE — Telephone Encounter (Signed)
Call to inform about PA approval N/A LVM.

## 2023-09-21 IMAGING — CR DG SHOULDER 2+V*L*
1 series · 4 of 4 positions shown · non-contrast
Comparison: None.

CLINICAL DATA: Shoulder injury.

EXAM:
LEFT SHOULDER - 2+ VIEW

[Series 1: dg shoulder left · 0.14mm/px · 4 of 4 slices shown]
[im 1/4]
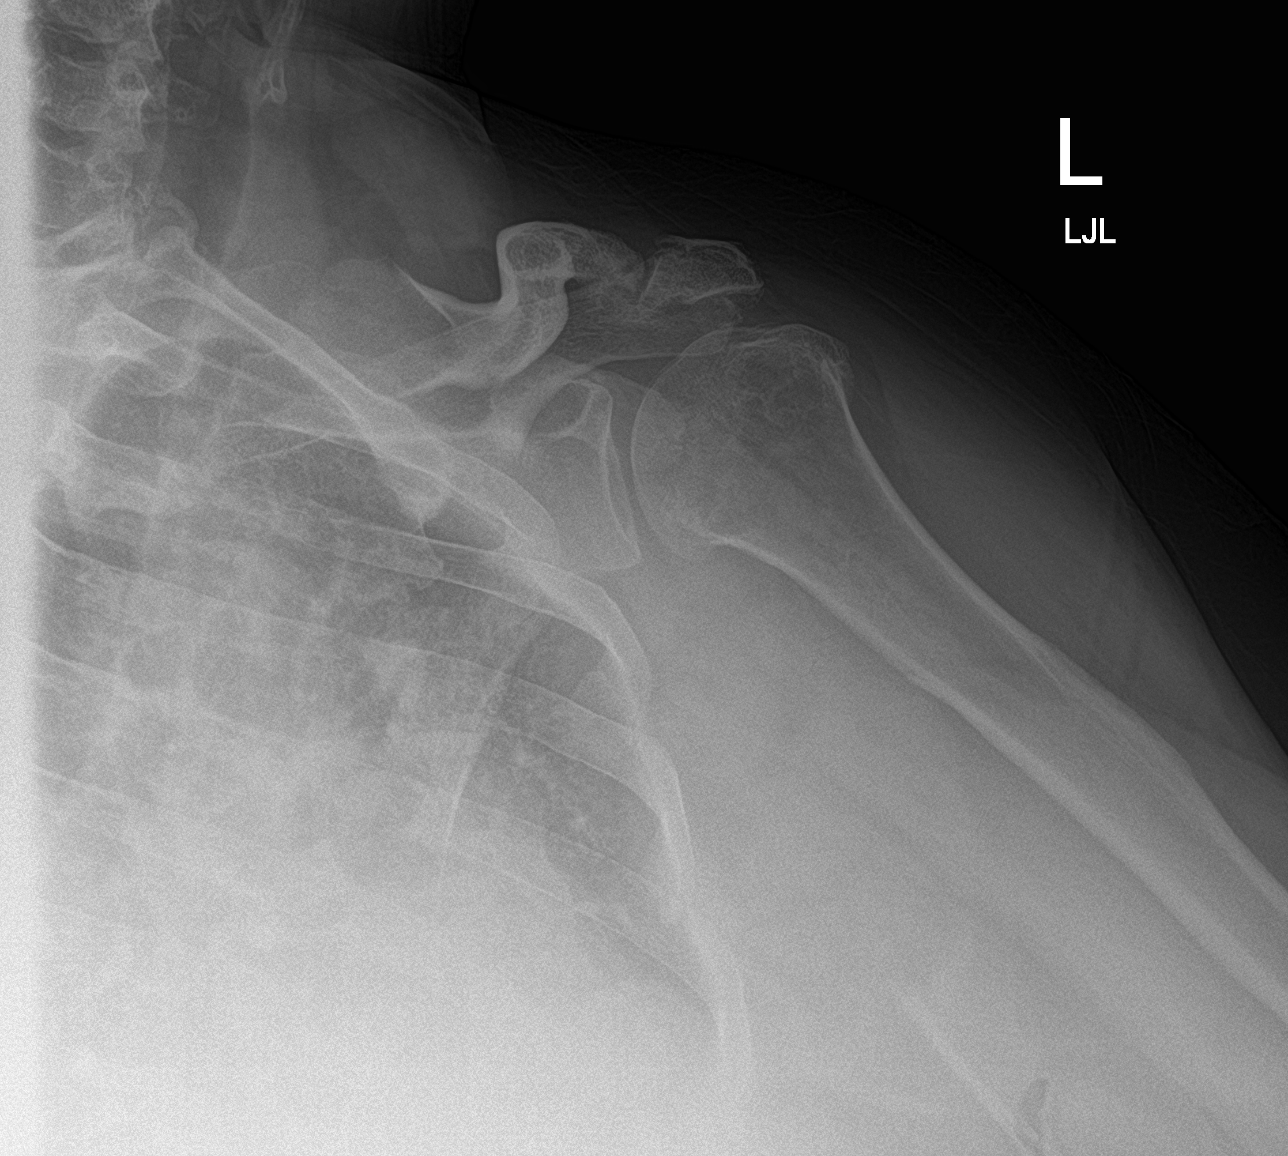
[im 2/4]
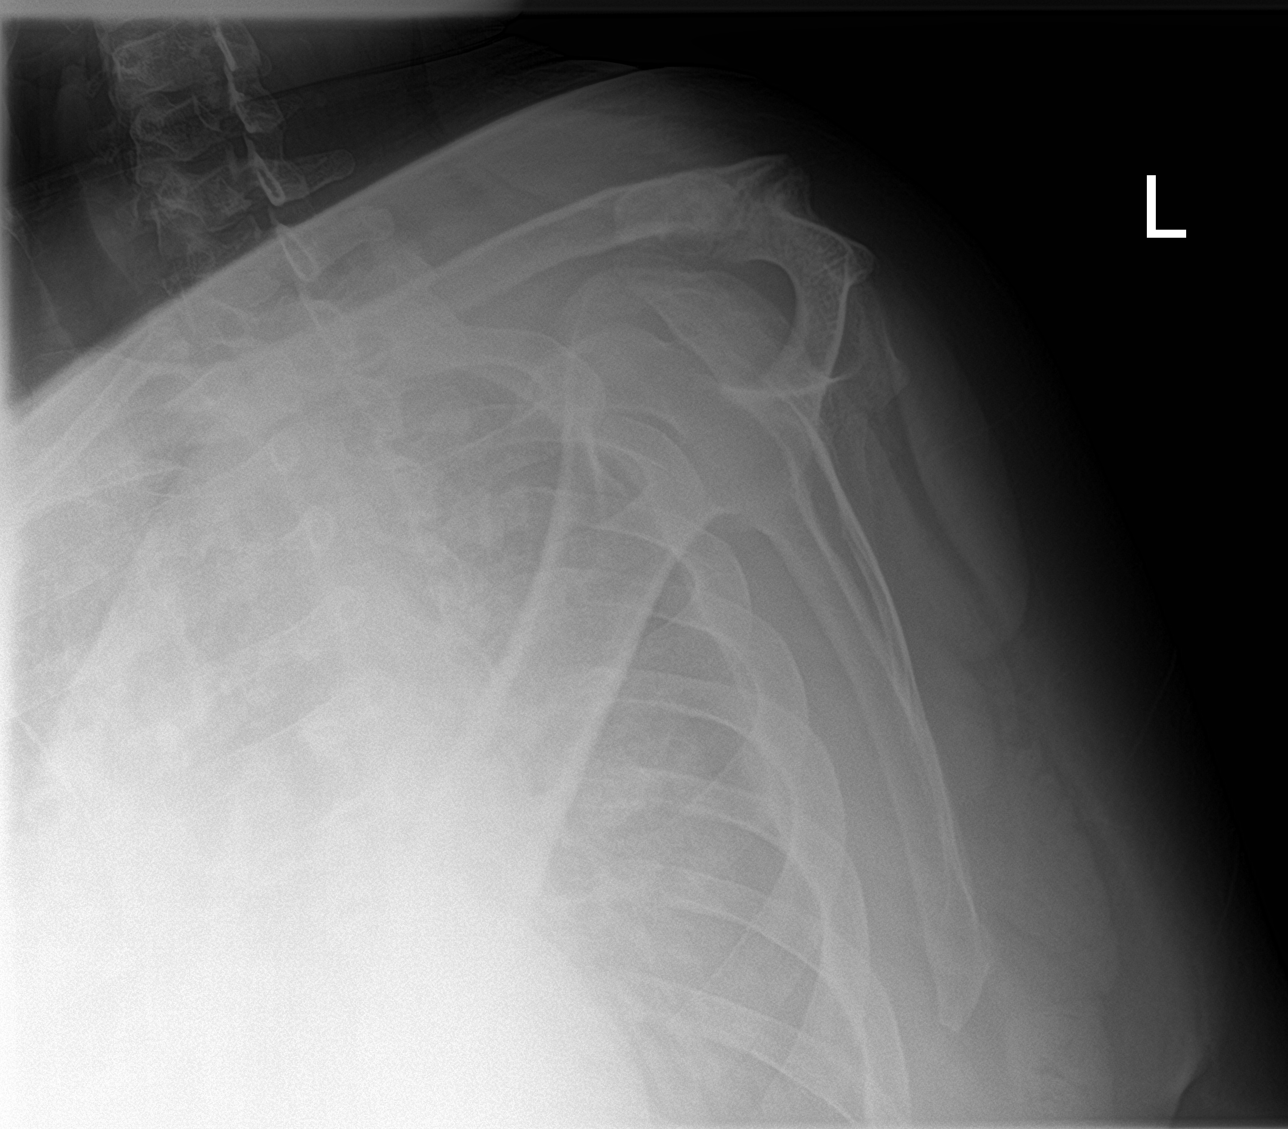
[im 3/4]
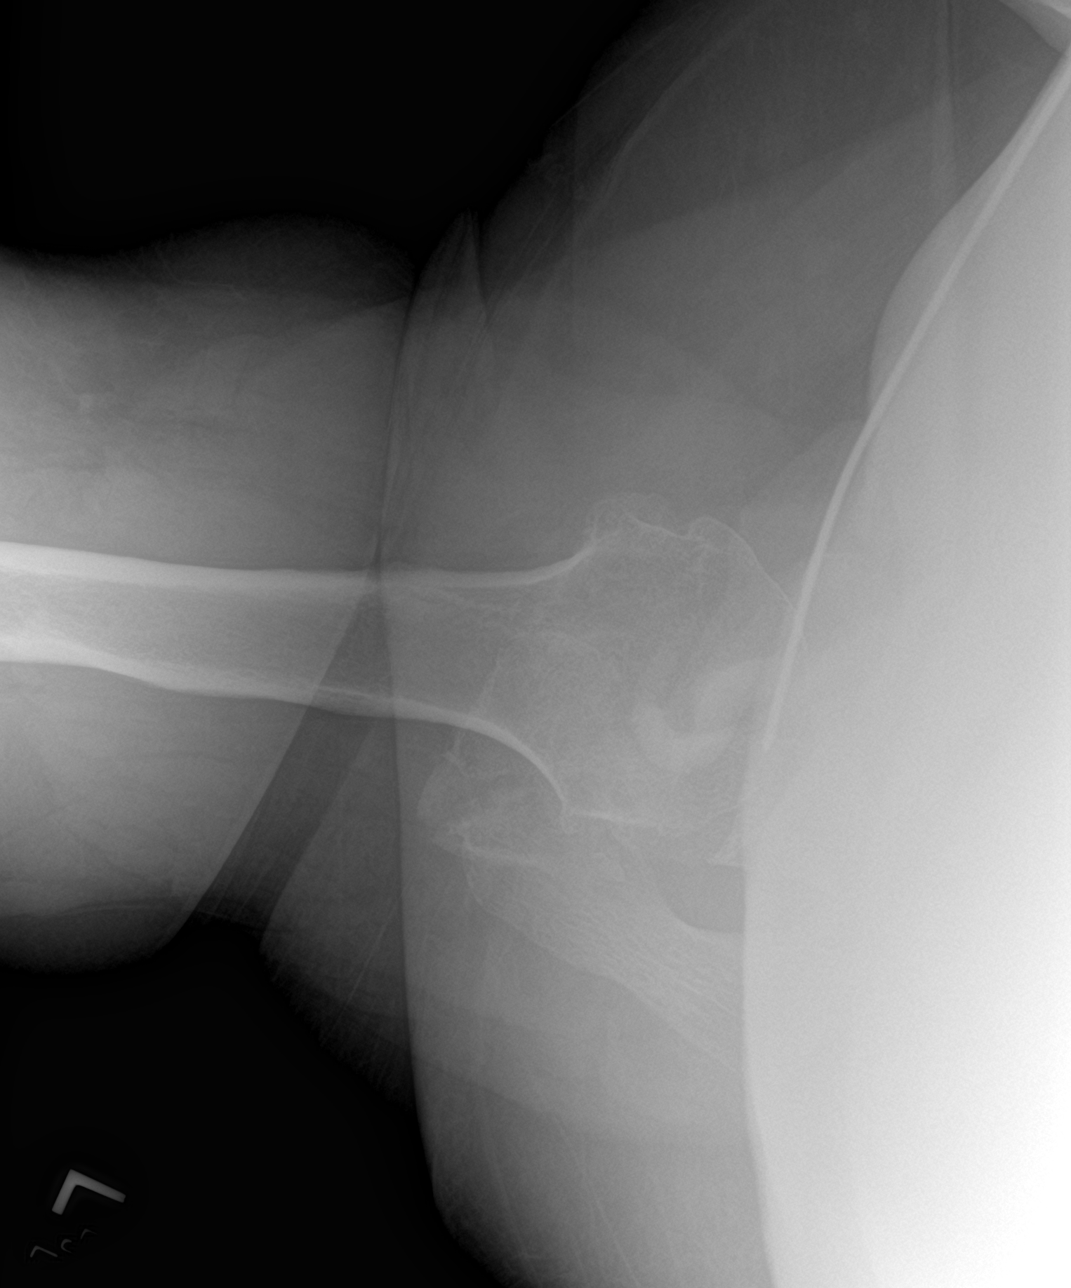
[im 4/4]
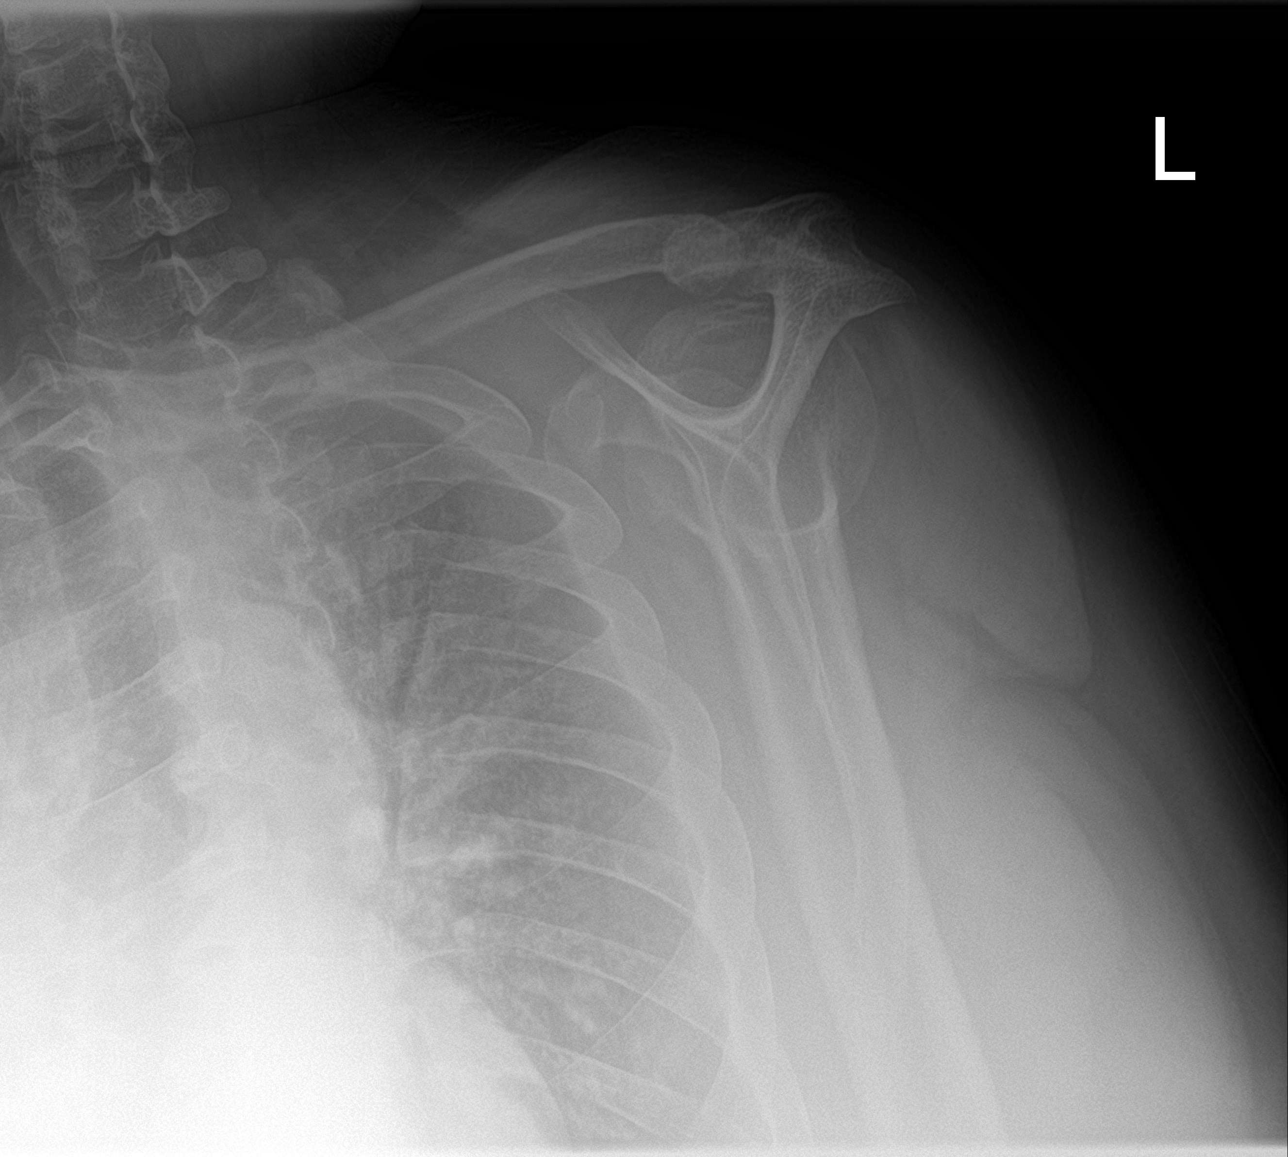

[4 of 4 positions shown; findings below may reference images not displayed]

FINDINGS: Glenohumeral joint is intact. No evidence of scapular fracture or
humeral fracture. The acromioclavicular joint is intact.
IMPRESSION: No fracture or dislocation.

## 2023-09-29 NOTE — Telephone Encounter (Signed)
PA approved, see other encounter.

## 2023-10-06 ENCOUNTER — Ambulatory Visit
Admission: RE | Admit: 2023-10-06 | Discharge: 2023-10-06 | Disposition: A | Discharge status: Home / Self Care | Payer: Medicare Other | Source: Ambulatory Visit | Attending: Cardiology

## 2023-10-06 DIAGNOSIS — I5022 Chronic systolic (congestive) heart failure: Secondary | ICD-10-CM | POA: Diagnosis not present

## 2023-10-06 LAB — ECHOCARDIOGRAM COMPLETE: S' Lateral: 6.6 cm

## 2023-10-07 ENCOUNTER — Institutional Professional Consult (permissible substitution): Payer: Medicare Other | Admitting: Pulmonary Disease

## 2023-10-12 ENCOUNTER — Encounter: Payer: Medicare Other | Admitting: Cardiology

## 2023-10-22 NOTE — Progress Notes (Signed)
 Remote ICD transmission.

## 2023-11-08 ENCOUNTER — Telehealth: Payer: Self-pay | Admitting: Cardiology

## 2023-11-08 NOTE — Progress Notes (Unsigned)
 ADVANCED HEART FAILURE CLINIC NOTE  Referring Physician: [name], NP  Primary Care: [name], NP  HPI: Marc [name] is a 48 y.o. male with HFrEF 2/2 NICM, OSA on CPAP, hx of tobacco use, morbid obesity presenting today to establish care. [name] reports first being diagnosed with systolic heart failure around 1610 at [facility]. He was diagnosed with the flu around that time followed by persistent SOB and diagnosis of nonischemic cardiomyopathy. 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 He was followed for several years at [facility] with LVEF ranging from 25-35%. He most recently presented to the [facility] ER in December 2023 with complaints of chest discomfort. At that time he was referred to HF clinic for further evaluation.  He had an echocardiogram in June 2023 demonstrating an LVEF of 25% and a repeat echo today w/ persistently reduced function.  He is now s/p ICD implant in 12/2022. Briefly required admission afterwards due to volume overload.   Interval hx:  -Lower extremity wound has now healed completely.  -He has unfortunately been able to afford Mounjoro.  -He reports that his shortness of breath has been doing much better.   Activity level/exercise tolerance:  NYHA III; also limited by underlying sciatica.  Orthopnea:  Sleeps on 3 pillows Paroxysmal noctural dyspnea:  No Chest pain/pressure:  No Orthostatic lightheadedness:  No Palpitations:  No Lower extremity edema:  No, resolved Presyncope/syncope:  No Cough:  No  Current Outpatient Medications  Medication Sig Dispense Refill   albuterol (VENTOLIN HFA) 108 (90 Base) MCG/ACT inhaler Inhale 2 puffs into the lungs every 6 (six) hours as needed for wheezing or shortness of breath. 18 g 0   amiodarone (PACERONE) 200 MG tablet Take 2 tablets (400 mg total) by mouth daily. PLEASE CALL OFFICE TO SCHEDULE APPOINTMENT PRIOR TO NEXT REFILL 180 tablet 0    aspirin EC 81 MG tablet Take 81 mg by mouth daily.     cyclobenzaprine (FLEXERIL) 10 MG tablet TAKE 1 TABLET BY MOUTH EVERY 8 HOURS AS NEEDED FOR MUSCLE SPASM 30 tablet 0   dapagliflozin propanediol (FARXIGA) 10 MG TABS tablet Take 1 tablet (10 mg total) by mouth daily before breakfast. 90 tablet 3   digoxin (LANOXIN) 0.125 MG tablet Take 1 tablet (0.125 mg total) by mouth daily. 90 tablet 3   docusate sodium (COLACE) 100 MG capsule Take 100 mg by mouth daily as needed for mild constipation.     hydrocortisone (ANUSOL-HC) 2.5 % rectal cream Place 1 Application rectally 2 (two) times daily. (Patient taking differently: Place 1 Application rectally 2 (two) times daily as needed for anal itching or hemorrhoids.) 30 g 0   metoprolol succinate (TOPROL XL) 50 MG 24 hr tablet Take 1 tablet (50 mg total) by mouth at bedtime. 30 tablet 3   omeprazole (PRILOSEC) 20 MG capsule Take 20 mg by mouth daily.     potassium chloride SA (KLOR-CON M) 20 MEQ tablet Take 1 tablet (20 mEq total) by mouth daily. 30 tablet 3   rosuvastatin (CRESTOR) 5 MG tablet Take 1 tablet (5 mg total) by mouth daily. 90 tablet 1   sacubitril-valsartan (ENTRESTO) 49-51 MG Take 1 tablet by mouth 2 (two) times daily. 180 tablet 3   spironolactone (ALDACTONE) 25 MG tablet Take 1 tablet (25 mg total) by mouth daily. 100 tablet 2   tirzepatide (MOUNJARO) 12.5 MG/0.5ML Pen Inject 12.5 mg into the skin once a week. (Patient not  taking: Reported on 09/16/2023) 2 mL 0   torsemide (DEMADEX) 20 MG tablet TAKE 80MG  IN THE MORNING AND 40MG  IN THE EVENING 180 tablet 3   triamcinolone cream (KENALOG) 0.1 % Apply 1 Application topically 2 (two) times daily. 30 g 0   umeclidinium-vilanterol (ANORO ELLIPTA) 62.5-25 MCG/ACT AEPB Inhale 1 puff into the lungs daily. 1 each 1   No current facility-administered medications for this visit.    PHYSICAL EXAM: There were no vitals filed for this visit. GENERAL: NAD Lungs- *** CARDIAC:  JVP: *** cm           Normal rate with regular rhythm. *** murmur.  Pulses ***. *** edema.  ABDOMEN: Soft, non-tender, non-distended.  EXTREMITIES: Warm and well perfused.  NEUROLOGIC: No obvious FND     DATA REVIEW  ECG: 08/12/22: sinus tachycardia as per my personal interpretation  ECHO: 11/04/22: LVEF 20-25% with global hypokinesis  as per my personal interpretation 10/10/18 ([facility]): LVEF 20-25%, RV with mildly reduced function.   Based on [facility] records he had a stress MRI in 2011 with an LVEF of 35%.   CATH: 09/19/20:  Severely depressed overall left ventricular function EF less than 25% Severely enlarged left ventricular chamber Left main large free of disease LAD was large and free of disease Circumflex was large and free of disease left dominant Right heart cath showed no evidence of pulmonary hypertension Mean PA was 23 mean wedge of 10 Cardiac output of 7.6 Fick  RHC 03/16/23:  HEMODYNAMICS: RA:                  9 mmHg (mean) PA:                  64/26 mmHg (42 mean) PCWP:            25 with large V waves                                      Estimated Fick CO/CI   5 L/min, 1.9 L/min/m2                                                 TPG                 17  mmHg                                            PVR                 3.4 Wood Units  PAPi                4       IMPRESSION: Moderate to severely elevated pre and post capillary filling pressures.  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  Severely reduced cardiac output / index.   07/22/23:    FDG uptake findings are inconsistent with active myocardial inflammation/sarcoidosis.   FDG uptake was not observed. LV perfusion is abnormal. There is no evidence of ischemia. Defect 1: There is a medium defect with moderate reduction in uptake present in the apical to mid  inferior location(s). FDG uptake was not present in the described defect segment(s). There is abnormal wall motion in the defect area.  Consistent with artifact caused by subdiaphragmatic activity; Based on this study, cannot exclude small inferior infarct (less likely given history)   Left ventricular function is abnormal. Global function is severely reduced. EF: 22%. End diastolic cavity size is severely enlarged. End systolic cavity size is severely enlarged.  ASSESSMENT & PLAN:  Heart failure with reduced EF, Stage D, End-stage systolic HF Etiology of UE:[id number] cardiomyopathy; LHC in 2022 with no significant CAD. Stress CMR in 2011, however, I cannot find the results. Hx of bicep tendon rupture; unable to obtain cardiac MRI, status post ICD placement.  LVEF 22% by cardiac PET NYHA class / AHA Stage:III, lost to follow up for several months. Functional class now NYHA III.  Volume status & Diuretics:  Euvolemic on exam today; torsemide 80mg  to 40mg  in the morning depending on his daily weights. Takes 60mg  most days.  Vasodilators: continue entresto 49/51mg  BID Beta-Blocker: increase metoprolol to 50mg  daily; continue digoxin. Repeat level today.  NWG:[id number] spironolactone 25mg  daily Cardiometabolic:continue farxiga 10mg  daily Devices therapies & Valvulopathies: s/p ICD placement  (5/24) by Dr. Lalla Brothers Advanced therapies: I believe he is having a continued decline in exercise capacity due to underlying HF. TTE from several months ago with dilated LV and normal RV function. He has excellent social support and decent insight if advanced therapies were required. Unfortunately, his BMI is too high for transplant at this time. Now started on Mounjaro. RHC w/ CI of 1.9. Undergoing LVAD/transplant evaluation.  08/02/23: Lost to follow up for several months during VAD evaluation; reports that he became overwhelmed. I told him he is not a candidate at this time due to obesity, LE foot wounds & COPD. We had a very long talk about this today. He will need to restart Monjouro, lose central adiposity and allow lower extremity wounds to  heal before we can re-consider. He has adequate social support with his wife and church group. Also has children.  09/12/22:  Lower extremity wounds have healed; NYHA III symptoms. Unable to afford Mounjoro. Weight remains a significant limitation to advanced therapies. Repeat TTE at follow up. We once again discussed transplant vs LVAD.   2. Frequent PVCs - Continue PO amiodarone 200mg  daily.  - Repeat TSH/LFTs today.   3. Morbid obesity - There is no height or weight on file to calculate BMI. - We reviewed his Mounjoro pricing together today; 194$ for 10mg  dosing. Reaching out to our pharmD to find cheaper alternatives. Weight loss is crucial for him.   4.  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  - Reports that Anoro has helped his breathing; will refill today.  - Refer to pulmonology again.   5. Foot wounds - Seen by podiatry on 07/27/23 - Interdigital skin breakdown with drainage in bilateral feet.  - It is now resolved.   I spent *** minutes caring for this patient today including face to face time, ordering and reviewing labs, reviewing records from ***, seeing the patient, documenting in the record, and arranging follow ups.    Cissy Galbreath Advanced Heart Failure Mechanical Circulatory Support

## 2023-11-08 NOTE — Telephone Encounter (Signed)
 Lvm to confirm appt 11/09/23

## 2023-11-09 ENCOUNTER — Other Ambulatory Visit (HOSPITAL_COMMUNITY): Payer: Self-pay

## 2023-11-09 ENCOUNTER — Ambulatory Visit: Payer: Medicare Other | Attending: Cardiology | Admitting: Cardiology

## 2023-11-09 VITALS — BP 100/61 | HR 67 | Wt 364.0 lb

## 2023-11-09 DIAGNOSIS — Z6841 Body Mass Index (BMI) 40.0 and over, adult: Secondary | ICD-10-CM

## 2023-11-09 DIAGNOSIS — G4733 Obstructive sleep apnea (adult) (pediatric): Secondary | ICD-10-CM

## 2023-11-09 DIAGNOSIS — Z79899 Other long term (current) drug therapy: Secondary | ICD-10-CM

## 2023-11-09 DIAGNOSIS — I5022 Chronic systolic (congestive) heart failure: Secondary | ICD-10-CM

## 2023-11-09 DIAGNOSIS — E118 Type 2 diabetes mellitus with unspecified complications: Secondary | ICD-10-CM

## 2023-11-09 DIAGNOSIS — J449 Chronic obstructive pulmonary disease, unspecified: Secondary | ICD-10-CM | POA: Diagnosis not present

## 2023-11-09 DIAGNOSIS — Z5181 Encounter for therapeutic drug level monitoring: Secondary | ICD-10-CM

## 2023-11-09 MED ORDER — MOUNJARO 7.5 MG/0.5ML ~~LOC~~ SOAJ: 7.5000 mg | SUBCUTANEOUS | 0 refills | Status: DC

## 2023-11-09 NOTE — Patient Instructions (Signed)
 Medication Changes:  No medication changes today!  Lab Work:  Go DOWN to LOWER LEVEL (LL) to have your blood work completed inside of [employer] office.  We will only call you if the results are abnormal or if the provider would like to make medication changes.   Referrals:  You have been referred to cardiac rehab. They will call you in order to schedule an appointment.    Follow-Up in: Please follow up with the Advanced [facility] Failure Clinic in 1 month with Dr. Gasper Lloyd.  At the Advanced [facility] Failure Clinic, you and your health needs are our priority. We have a designated team specialized in the treatment of Heart Failure. This Care Team includes your primary Heart Failure Specialized Cardiologist (physician), Advanced Practice Providers (APPs- Physician Assistants and Nurse Practitioners), and Pharmacist who all work together to provide you with the care you need, when you need it.   You may see any of the following providers on your designated Care Team at your next follow up:  Dr. Arvilla Meres Dr. Marca Ancona Dr. Dorthula Nettles Dr. Theresia Bough Clarisa Kindred, FNP Enos Fling, RPH-CPP  Please be sure to bring in all your medications bottles to every appointment.   Need to Contact us:  If you have any questions or concerns before your next appointment please send [place] a message through [place] or call our office at [phone].    TO LEAVE A MESSAGE FOR THE NURSE SELECT OPTION 2, PLEASE LEAVE A MESSAGE INCLUDING: YOUR NAME DATE OF BIRTH CALL BACK NUMBER REASON FOR CALL**this is important as we prioritize the call backs  YOU WILL RECEIVE A CALL BACK THE SAME DAY AS LONG AS YOU CALL BEFORE 4:00 PM

## 2023-11-17 LAB — COMPREHENSIVE METABOLIC PANEL WITH GFR
ALT: 10 IU/L (ref 0–44)
AST: 18 IU/L (ref 0–40)
Albumin: 3.9 g/dL — ABNORMAL LOW (ref 4.1–5.1)
Alkaline Phosphatase: 108 IU/L (ref 44–121)
BUN/Creatinine Ratio: 14 (ref 9–20)
BUN: 15 mg/dL (ref 6–24)
Bilirubin Total: <0.2 mg/dL (ref 0.0–1.2)
CO2: 22 mmol/L (ref 20–29)
Calcium: 9 mg/dL (ref 8.7–10.2)
Chloride: 103 mmol/L (ref 96–106)
Creatinine, Ser: 1.06 mg/dL (ref 0.76–1.27)
Globulin, Total: 3.2 g/dL (ref 1.5–4.5)
Glucose: 84 mg/dL (ref 70–99)
Potassium: 4.7 mmol/L (ref 3.5–5.2)
Sodium: 140 mmol/L (ref 134–144)
Total Protein: 7.1 g/dL (ref 6.0–8.5)
eGFR: 86 mL/min/{1.73_m2} (ref >59)

## 2023-11-17 LAB — TSH: TSH: 1.37 u[IU]/mL (ref 0.450–4.500)

## 2023-11-17 LAB — T3, FREE: T3, Free: 2.9 pg/mL (ref 2.0–4.4)

## 2023-11-17 LAB — T4, FREE: Free T4: 1.13 ng/dL (ref 0.82–1.77)

## 2023-11-17 LAB — BRAIN NATRIURETIC PEPTIDE: BNP: 95.2 pg/mL (ref 0.0–100.0)

## 2023-11-17 LAB — DIGOXIN LEVEL

## 2023-11-25 ENCOUNTER — Encounter: Attending: Cardiology | Admitting: *Deleted

## 2023-11-25 ENCOUNTER — Encounter: Payer: Self-pay | Admitting: *Deleted

## 2023-11-25 DIAGNOSIS — I5022 Chronic systolic (congestive) heart failure: Secondary | ICD-10-CM | POA: Insufficient documentation

## 2023-11-25 DIAGNOSIS — Z5189 Encounter for other specified aftercare: Secondary | ICD-10-CM | POA: Insufficient documentation

## 2023-11-25 DIAGNOSIS — I110 Hypertensive heart disease with heart failure: Secondary | ICD-10-CM | POA: Insufficient documentation

## 2023-11-25 NOTE — Progress Notes (Signed)
 Virtual orientation call completed today. he has an appointment on Date: 12/06/2023   for EP eval and gym Orientation.  Documentation of diagnosis can be found in [facility] 11/09/2023 . Amitai is a current tobacco user. Intervention for tobacco cessation was provided at the initial medical review. He was asked about readiness to quit and reported he is weaning down from a pack a day and is at 1/2 a pack a day at this time.  No quit date yet.  . Patient was advised and educated about tobacco cessation using combination therapy, tobacco cessation classes, quit line, and quit smoking apps. Patient demonstrated understanding of this material. Staff will continue to provide encouragement and follow up with the patient throughout the program.

## 2023-12-02 ENCOUNTER — Other Ambulatory Visit: Payer: Self-pay | Admitting: Cardiology

## 2023-12-06 ENCOUNTER — Encounter

## 2023-12-06 VITALS — Ht 68.5 in | Wt 357.5 lb

## 2023-12-06 DIAGNOSIS — I5022 Chronic systolic (congestive) heart failure: Secondary | ICD-10-CM | POA: Diagnosis not present

## 2023-12-06 DIAGNOSIS — I5020 Unspecified systolic (congestive) heart failure: Secondary | ICD-10-CM | POA: Diagnosis present

## 2023-12-06 DIAGNOSIS — Z5189 Encounter for other specified aftercare: Secondary | ICD-10-CM | POA: Diagnosis not present

## 2023-12-06 DIAGNOSIS — I110 Hypertensive heart disease with heart failure: Secondary | ICD-10-CM | POA: Diagnosis not present

## 2023-12-06 NOTE — Progress Notes (Signed)
 Assessment start time: 10:25 AM  Digestive issues/concerns: no known food allergies   24-hours Recall: B: nothing L: beef and veggies over rice D: snacky food before bed  Beverages Was drinking a lot of juice, but has been working on cutting back. He still drinking some soda.   Education r/t nutrition plan Patient is currently drinking some soda, but has been working on cutting back on juice. Discussed sugary beverages and how they affect his blood sugar and weight. Reccommended he switch to water or sugar free alternatives. He reports he usually only eats one meal. Will snack on sweets or junk foods before bed. He reports its a habit and enjoys those foods. Says he doesn't eat in the mornings because he is on a lot of meds and often doesn't feel like eating. His wife has been trying to get him to eat in the mornings. He is willing to work on this, but would like some help. Spoke with him about the goals of morning meals and how to make the meals smaller and easy to pull off even when he doesn't feel like eating much. Brainstormed several breakfast ideas that have protein, complex carbs and keep sodium controlled. Provided him with a guideline of less than 1500mg  sodium per day. He says he doesn't use salt at the table. Reminded him to read labels and make sure the foods he is eating are not already very salty. Provided mediterranean diet handout. Educated on types of fats, sources, and how to read labels. Went over balanced plates handout and encouraged more veggies and low calorie dense meals with larger meals.    Goal 1: Read labels and reduce sodium intake to below 2300mg . Ideally 1500mg  per day.  Goal 2: Eat 15-30gProtein and 30-60gCarbs at each meal. Goal 3: Include more colorful produce, aim for 5-8 servings of fruits and veggies per day  End time 11:04 AM

## 2023-12-06 NOTE — Progress Notes (Signed)
 Cardiac Individual Treatment Plan  Patient Details  Name: Marc [name] MRN: [id number] Date of Birth: 11-29-1975 Referring Provider:   Flowsheet Row Cardiac Rehab from 12/06/2023 in [facility] Cardiac and Pulmonary Rehab  Referring Provider Alwin Baars, DO       Initial Encounter Date:  Flowsheet Row Cardiac Rehab from 12/06/2023 in [facility] Cardiac and Pulmonary Rehab  Date 12/06/23       Visit Diagnosis: Chronic systolic heart failure (HCC)  Patient's Home Medications on Admission:  Current Outpatient Medications:    albuterol  (VENTOLIN  HFA) 108 (90 Base) MCG/ACT inhaler, Inhale 2 puffs into the lungs every 6 (six) hours as needed for wheezing or shortness of breath., Disp: 18 g, Rfl: 0   amiodarone  (PACERONE ) 200 MG tablet, Take 2 tablets (400 mg total) by mouth daily. PLEASE CALL OFFICE TO SCHEDULE APPOINTMENT PRIOR TO NEXT REFILL, Disp: 180 tablet, Rfl: 0   aspirin  EC 81 MG tablet, Take 81 mg by mouth daily., Disp: , Rfl:    cyclobenzaprine  (FLEXERIL ) 10 MG tablet, TAKE 1 TABLET BY MOUTH EVERY 8 HOURS AS NEEDED FOR MUSCLE SPASM, Disp: 30 tablet, Rfl: 0   dapagliflozin  propanediol (FARXIGA ) 10 MG TABS tablet, Take 1 tablet (10 mg total) by mouth daily before breakfast., Disp: 90 tablet, Rfl: 3   digoxin  (LANOXIN ) 0.125 MG tablet, Take 1 tablet (0.125 mg total) by mouth daily., Disp: 90 tablet, Rfl: 3   docusate sodium  (COLACE) 100 MG capsule, Take 100 mg by mouth daily as needed for mild constipation., Disp: , Rfl:    hydrocortisone  (ANUSOL -HC) 2.5 % rectal cream, Place 1 Application rectally 2 (two) times daily. (Patient taking differently: Place 1 Application rectally 2 (two) times daily as needed for anal itching or hemorrhoids.), Disp: 30 g, Rfl: 0   metoprolol  succinate (TOPROL  XL) 50 MG 24 hr tablet, Take 1 tablet (50 mg total) by mouth at bedtime., Disp: 30 tablet, Rfl: 3   omeprazole (PRILOSEC) 20 MG capsule, Take 20 mg by mouth daily., Disp: , Rfl:    potassium chloride   SA (KLOR-CON  M) 20 MEQ tablet, Take 1 tablet (20 mEq total) by mouth daily., Disp: 30 tablet, Rfl: 3   rosuvastatin  (CRESTOR ) 5 MG tablet, Take 1 tablet (5 mg total) by mouth daily., Disp: 90 tablet, Rfl: 1   sacubitril -valsartan  (ENTRESTO ) 49-51 MG, Take 1 tablet by mouth 2 (two) times daily., Disp: 180 tablet, Rfl: 3   spironolactone  (ALDACTONE ) 25 MG tablet, Take 1 tablet (25 mg total) by mouth daily., Disp: 100 tablet, Rfl: 2   tirzepatide  (MOUNJARO ) 7.5 MG/0.5ML Pen, Inject 7.5 mg into the skin once a week., Disp: 2 mL, Rfl: 0   torsemide  (DEMADEX ) 20 MG tablet, TAKE 80MG  IN THE MORNING AND 40MG  IN THE EVENING, Disp: 180 tablet, Rfl: 3   triamcinolone  cream (KENALOG ) 0.1 %, Apply 1 Application topically 2 (two) times daily., Disp: 30 g, Rfl: 0   umeclidinium-vilanterol (ANORO ELLIPTA ) 62.5-25 MCG/ACT AEPB, Inhale 1 puff into the lungs daily., Disp: 1 each, Rfl: 1  Past Medical History: Past Medical History:  Diagnosis Date   CHF (congestive heart failure) (HCC)    Hypertension     Tobacco Use: Social History   Tobacco Use  Smoking Status Every Day   Current packs/day: 0.50   Average packs/day: 0.5 packs/day for 31.3 years (15.7 ttl pk-yrs)   Types: Cigarettes   Start date: 1994  Smokeless Tobacco Never  Tobacco Comments   Under 1/2 pack a day. Was smoking a pack a day.  No quit date, weaning numbers of cigarettes    Labs: Review Flowsheet  More data exists      Latest Ref Rng & Units 02/24/2022 03/16/2023 04/22/2023 07/01/2023 09/13/2023  Labs for ITP Cardiac and Pulmonary Rehab  Cholestrol 100 - 199 mg/dL - - - 045  -  LDL (calc) 0 - 99 mg/dL - - - 409  -  HDL-C >81 mg/dL - - - 49  -  Trlycerides 0 - 149 mg/dL - - - 191  -  Hemoglobin A1c 4.8 - 5.6 % 6.2  - 6.4  6.0  6.1   Bicarbonate 20.0 - 28.0 mmol/L 20.0 - 28.0 mmol/L - 27.9  27.5  - - -  TCO2 22 - 32 mmol/L 22 - 32 mmol/L - 29  29  - - -  O2 Saturation % % - 56  58  - - -    Details       Multiple values  from one day are sorted in reverse-chronological order          Exercise Target Goals: Exercise Program Goal: Individual exercise prescription set using results from initial 6 min walk test and THRR while considering  patient's activity barriers and safety.   Exercise Prescription Goal: Initial exercise prescription builds to 30-45 minutes a day of aerobic activity, 2-3 days per week.  Home exercise guidelines will be given to patient during program as part of exercise prescription that the participant will acknowledge.   Education: Aerobic Exercise: - Group verbal and visual presentation on the components of exercise prescription. Introduces F.I.T.T principle from ACSM for exercise prescriptions.  Reviews F.I.T.T. principles of aerobic exercise including progression. Written material given at graduation.   Education: Resistance Exercise: - Group verbal and visual presentation on the components of exercise prescription. Introduces F.I.T.T principle from ACSM for exercise prescriptions  Reviews F.I.T.T. principles of resistance exercise including progression. Written material given at graduation.    Education: Exercise & Equipment Safety: - Individual verbal instruction and demonstration of equipment use and safety with use of the equipment. Flowsheet Row Cardiac Rehab from 12/06/2023 in [facility] Cardiac and Pulmonary Rehab  Date 12/06/23  Educator MB  Instruction Review Code 1- Verbalizes Understanding       Education: Exercise Physiology & General Exercise Guidelines: - Group verbal and written instruction with models to review the exercise physiology of the cardiovascular system and associated critical values. Provides general exercise guidelines with specific guidelines to those with heart or lung disease.    Education: Flexibility, Balance, Mind/Body Relaxation: - Group verbal and visual presentation with interactive activity on the components of exercise prescription. Introduces  F.I.T.T principle from ACSM for exercise prescriptions. Reviews F.I.T.T. principles of flexibility and balance exercise training including progression. Also discusses the mind body connection.  Reviews various relaxation techniques to help reduce and manage stress (i.e. Deep breathing, progressive muscle relaxation, and visualization). Balance handout provided to take home. Written material given at graduation.   Activity Barriers & Risk Stratification:  Activity Barriers & Cardiac Risk Stratification - 12/06/23 1123       Activity Barriers & Cardiac Risk Stratification   Activity Barriers Joint Problems;Arthritis   Knees (Prevent from standing long periods of time)   Cardiac Risk Stratification High             6 Minute Walk:  6 Minute Walk     Row Name 12/06/23 1120         6 Minute Walk   Phase Initial  Distance 705 feet     Walk Time 6 minutes     # of Rest Breaks 0     MPH 1.34     METS 1.57     RPE 13     Perceived Dyspnea  1     VO2 Peak 5.49     Symptoms Yes (comment)     Comments Bilateral hip and feet pain (5/10)     Resting HR 67 bpm     Resting BP 104/62     Resting Oxygen Saturation  96 %     Exercise Oxygen Saturation  during 6 min walk 99 %     Max Ex. HR 113 bpm     Max Ex. BP 130/70     2 Minute Post BP 100/68              Oxygen Initial Assessment:   Oxygen Re-Evaluation:   Oxygen Discharge (Final Oxygen Re-Evaluation):   Initial Exercise Prescription:  Initial Exercise Prescription - 12/06/23 1100       Date of Initial Exercise RX and Referring Provider   Date 12/06/23    Referring Provider Alwin Baars, DO      Oxygen   Maintain Oxygen Saturation 88% or higher      Treadmill   MPH 0.7    Grade 0    Minutes 15    METs 1.57      Recumbant Bike   Level 1    RPM 50    Watts 15    Minutes 15    METs 1.57      NuStep   Level 1    SPM 80    Minutes 15    METs 1.57      Arm Ergometer   Level 1    Watts 15     RPM 30    Minutes 15    METs 1.57      T5 Nustep   Level 1    SPM 80    Minutes 15    METs 1.57      Track   Laps 10    Minutes 15    METs 1.54      Prescription Details   Frequency (times per week) 2    Duration Progress to 30 minutes of continuous aerobic without signs/symptoms of physical distress      Intensity   THRR 40-80% of Max Heartrate 108-150    Ratings of Perceived Exertion 11-13    Perceived Dyspnea 0-4      Progression   Progression Continue to progress workloads to maintain intensity without signs/symptoms of physical distress.      Resistance Training   Training Prescription Yes    Weight 4, 7 lb   (4lb for L arm)   Reps 10-15             Perform Capillary Blood Glucose checks as needed.  Exercise Prescription Changes:   Exercise Prescription Changes     Row Name 12/06/23 1100             Response to Exercise   Blood Pressure (Admit) 104/62       Blood Pressure (Exercise) 130/70       Blood Pressure (Exit) 100/68       Heart Rate (Admit) 67 bpm       Heart Rate (Exercise) 113 bpm       Heart Rate (Exit) 72 bpm       Oxygen Saturation (  Admit) 96 %       Oxygen Saturation (Exercise) 99 %       Oxygen Saturation (Exit) 97 %       Rating of Perceived Exertion (Exercise) 13       Perceived Dyspnea (Exercise) 1       Symptoms Bilateral hip and feet pain (5/10)       Comments results       Duration Progress to 30 minutes of  aerobic without signs/symptoms of physical distress       Intensity THRR New         Progression   Progression Continue to progress workloads to maintain intensity without signs/symptoms of physical distress.       Average METs 1.57                Exercise Comments:   Exercise Goals and Review:   Exercise Goals     Row Name 12/06/23 1128             Exercise Goals   Increase Physical Activity Yes       Intervention Provide advice, education, support and counseling about physical  activity/exercise needs.;Develop an individualized exercise prescription for aerobic and resistive training based on initial evaluation findings, risk stratification, comorbidities and participant's personal goals.       Expected Outcomes Short Term: Attend rehab on a regular basis to increase amount of physical activity.;Long Term: Add in home exercise to make exercise part of routine and to increase amount of physical activity.;Long Term: Exercising regularly at least 3-5 days a week.       Increase Strength and Stamina Yes       Intervention Provide advice, education, support and counseling about physical activity/exercise needs.;Develop an individualized exercise prescription for aerobic and resistive training based on initial evaluation findings, risk stratification, comorbidities and participant's personal goals.       Expected Outcomes Short Term: Increase workloads from initial exercise prescription for resistance, speed, and METs.;Short Term: Perform resistance training exercises routinely during rehab and add in resistance training at home;Long Term: Improve cardiorespiratory fitness, muscular endurance and strength as measured by increased METs and functional capacity ( )       Able to understand and use rate of perceived exertion (RPE) scale Yes       Intervention Provide education and explanation on how to use RPE scale       Expected Outcomes Short Term: Able to use RPE daily in rehab to express subjective intensity level;Long Term:  Able to use RPE to guide intensity level when exercising independently       Able to understand and use Dyspnea scale Yes       Intervention Provide education and explanation on how to use Dyspnea scale       Expected Outcomes Short Term: Able to use Dyspnea scale daily in rehab to express subjective sense of shortness of breath during exertion;Long Term: Able to use Dyspnea scale to guide intensity level when exercising independently       Knowledge and  understanding of Target Heart Rate Range (THRR) Yes       Intervention Provide education and explanation of THRR including how the numbers were predicted and where they are located for reference       Expected Outcomes Short Term: Able to state/look up THRR;Short Term: Able to use daily as guideline for intensity in rehab;Long Term: Able to use THRR to govern intensity when exercising independently  Able to check pulse independently Yes       Intervention Provide education and demonstration on how to check pulse in carotid and radial arteries.;Review the importance of being able to check your own pulse for safety during independent exercise       Expected Outcomes Short Term: Able to explain why pulse checking is important during independent exercise;Long Term: Able to check pulse independently and accurately       Understanding of Exercise Prescription Yes       Intervention Provide education, explanation, and written materials on patient's individual exercise prescription       Expected Outcomes Short Term: Able to explain program exercise prescription;Long Term: Able to explain home exercise prescription to exercise independently                Exercise Goals Re-Evaluation :   Discharge Exercise Prescription (Final Exercise Prescription Changes):  Exercise Prescription Changes - 12/06/23 1100       Response to Exercise   Blood Pressure (Admit) 104/62    Blood Pressure (Exercise) 130/70    Blood Pressure (Exit) 100/68    Heart Rate (Admit) 67 bpm    Heart Rate (Exercise) 113 bpm    Heart Rate (Exit) 72 bpm    Oxygen Saturation (Admit) 96 %    Oxygen Saturation (Exercise) 99 %    Oxygen Saturation (Exit) 97 %    Rating of Perceived Exertion (Exercise) 13    Perceived Dyspnea (Exercise) 1    Symptoms Bilateral hip and feet pain (5/10)    Comments results    Duration Progress to 30 minutes of  aerobic without signs/symptoms of physical distress    Intensity THRR New       Progression   Progression Continue to progress workloads to maintain intensity without signs/symptoms of physical distress.    Average METs 1.57             Nutrition:  Target Goals: Understanding of nutrition guidelines, daily intake of sodium 1500mg , cholesterol 200mg , calories 30% from fat and 7% or less from saturated fats, daily to have 5 or more servings of fruits and vegetables.  Education: All About Nutrition: -Group instruction provided by verbal, written material, interactive activities, discussions, models, and posters to present general guidelines for heart healthy nutrition including fat, fiber, MyPlate, the role of sodium in heart healthy nutrition, utilization of the nutrition label, and utilization of this knowledge for meal planning. Follow up email sent as well. Written material given at graduation.   Biometrics:  Pre Biometrics - 12/06/23 1128       Pre Biometrics   Height 5' 8.5" (1.74 m)    Weight 357 lb 8 oz (162.2 kg)    Waist Circumference 61 inches    Hip Circumference 63 inches    Waist to Hip Ratio 0.97 %    BMI (Calculated) 53.56    Single Leg Stand 6 seconds              Nutrition Therapy Plan and Nutrition Goals:   Nutrition Assessments:  MEDIFICTS Score Key: >=70 Need to make dietary changes  40-70 Heart Healthy Diet <= 40 Therapeutic Level Cholesterol Diet   Picture Your Plate Scores: <57 Unhealthy dietary pattern with much room for improvement. 41-50 Dietary pattern unlikely to meet recommendations for good health and room for improvement. 51-60 More healthful dietary pattern, with some room for improvement.  >60 Healthy dietary pattern, although there may be some specific behaviors that could be  improved.    Nutrition Goals Re-Evaluation:   Nutrition Goals Discharge (Final Nutrition Goals Re-Evaluation):   Psychosocial: Target Goals: Acknowledge presence or absence of significant depression and/or stress, maximize  coping skills, provide positive support system. Participant is able to verbalize types and ability to use techniques and skills needed for reducing stress and depression.   Education: Stress, Anxiety, and Depression - Group verbal and visual presentation to define topics covered.  Reviews how body is impacted by stress, anxiety, and depression.  Also discusses healthy ways to reduce stress and to treat/manage anxiety and depression.  Written material given at graduation.   Education: Sleep Hygiene -Provides group verbal and written instruction about how sleep can affect your health.  Define sleep hygiene, discuss sleep cycles and impact of sleep habits. Review good sleep hygiene tips.    Initial Review & Psychosocial Screening:  Initial Psych Review & Screening - 11/25/23 1342       Initial Review   Current issues with None Identified      Family Dynamics   Good Support System? Yes   wife and sister     Barriers   Psychosocial barriers to participate in program There are no identifiable barriers or psychosocial needs.      Screening Interventions   Interventions To provide support and resources with identified psychosocial needs;Provide feedback about the scores to participant    Expected Outcomes Short Term goal: Utilizing psychosocial counselor, staff and physician to assist with identification of specific Stressors or current issues interfering with healing process. Setting desired goal for each stressor or current issue identified.;Long Term Goal: Stressors or current issues are controlled or eliminated.;Short Term goal: Identification and review with participant of any Quality of Life or Depression concerns found by scoring the questionnaire.;Long Term goal: The participant improves quality of Life and PHQ9 Scores as seen by post scores and/or verbalization of changes             Quality of Life Scores:   Scores of 19 and below usually indicate a poorer quality of life in  these areas.  A difference of  2-3 points is a clinically meaningful difference.  A difference of 2-3 points in the total score of the Quality of Life Index has been associated with significant improvement in overall quality of life, self-image, physical symptoms, and general health in studies assessing change in quality of life.  PHQ-9: Review Flowsheet  More data exists      12/06/2023 09/16/2023 05/31/2023 05/26/2022 05/25/2022  Depression screen PHQ 2/9  Decreased Interest 1 1 1 1  0  Down, Depressed, Hopeless 0 1 0 0 1  PHQ - 2 Score 1 2 1 1 1   Altered sleeping 2 1 0 1 -  Tired, decreased energy 2 1 2 1  -  Change in appetite 3 0 0 0 -  Feeling bad or failure about yourself  1 0 0 0 -  Trouble concentrating 1 0 0 0 -  Moving slowly or fidgety/restless 0 0 2 0 -  Suicidal thoughts 0 0 0 0 -  PHQ-9 Score 10 4 5 3  -  Difficult doing work/chores Somewhat difficult Somewhat difficult Somewhat difficult Not difficult at all -   Interpretation of Total Score  Total Score Depression Severity:  1-4 = Minimal depression, 5-9 = Mild depression, 10-14 = Moderate depression, 15-19 = Moderately severe depression, 20-27 = Severe depression   Psychosocial Evaluation and Intervention:  Psychosocial Evaluation - 11/25/23 1410  Psychosocial Evaluation & Interventions   Interventions Encouraged to exercise with the program and follow exercise prescription    Comments THere are no barriers to attending the program.  He lives with his wife, adult daughter(austistic/non verbal) and mother in law.  He is ready to start the program.  He is working on weight loss.  He states he has no concerns.   He is working on tobacco cessation and doe not have a quit date ,;he has decreased from a pack to 1/2 a pack aday. He is ready to start the program.    Expected Outcomes STG attend all sessions, conntinue working on tobacco cessation and weight loss. Work on exericse progression.  LTG Able to maintain/continue  tobacco cessation, weight loss and exercise progression    Continue Psychosocial Services  Follow up required by staff             Psychosocial Re-Evaluation:   Psychosocial Discharge (Final Psychosocial Re-Evaluation):   Vocational Rehabilitation: Provide vocational rehab assistance to qualifying candidates.   Vocational Rehab Evaluation & Intervention:  Vocational Rehab - 11/25/23 1349       Initial Vocational Rehab Evaluation & Intervention   Assessment shows need for Vocational Rehabilitation No      Vocational Rehab Re-Evaulation   Comments no requests at this time.             Education: Education Goals: Education classes will be provided on a variety of topics geared toward better understanding of heart health and risk factor modification. Participant will state understanding/return demonstration of topics presented as noted by education test scores.  Learning Barriers/Preferences:   General Cardiac Education Topics:  AED/CPR: - Group verbal and written instruction with the use of models to demonstrate the basic use of the AED with the basic ABC's of resuscitation.   Anatomy and Cardiac Procedures: - Group verbal and visual presentation and models provide information about basic cardiac anatomy and function. Reviews the testing methods done to diagnose heart disease and the outcomes of the test results. Describes the treatment choices: Medical Management, Angioplasty, or Coronary Bypass Surgery for treating various heart conditions including Myocardial Infarction, Angina, Valve Disease, and Cardiac Arrhythmias.  Written material given at graduation.   Medication Safety: - Group verbal and visual instruction to review commonly prescribed medications for heart and lung disease. Reviews the medication, class of the drug, and side effects. Includes the steps to properly store meds and maintain the prescription regimen.  Written material given at  graduation.   Intimacy: - Group verbal instruction through game format to discuss how heart and lung disease can affect sexual intimacy. Written material given at graduation..   Know Your Numbers and Heart Failure: - Group verbal and visual instruction to discuss disease risk factors for cardiac and pulmonary disease and treatment options.  Reviews associated critical values for Overweight/Obesity, Hypertension, Cholesterol, and Diabetes.  Discusses basics of heart failure: signs/symptoms and treatments.  Introduces Heart Failure Zone chart for action plan for heart failure.  Written material given at graduation.   Infection Prevention: - Provides verbal and written material to individual with discussion of infection control including proper hand washing and proper equipment cleaning during exercise session. Flowsheet Row Cardiac Rehab from 12/06/2023 in [facility] Cardiac and Pulmonary Rehab  Date 12/06/23  Educator MB  Instruction Review Code 1- Verbalizes Understanding       Falls Prevention: - Provides verbal and written material to individual with discussion of falls prevention and safety. Flowsheet Row Cardiac Rehab  from 12/06/2023 in [facility] Cardiac and Pulmonary Rehab  Date 12/06/23  Educator MB  Instruction Review Code 1- Verbalizes Understanding       Other: -Provides group and verbal instruction on various topics (see comments)   Knowledge Questionnaire Score:   Core Components/Risk Factors/Patient Goals at Admission:  Personal Goals and Risk Factors at Admission - 12/06/23 1129       Core Components/Risk Factors/Patient Goals on Admission    Weight Management Yes;Obesity;Weight Loss    Intervention Weight Management: Develop a combined nutrition and exercise program designed to reach desired caloric intake, while maintaining appropriate intake of nutrient and fiber, sodium and fats, and appropriate energy expenditure required for the weight goal.;Obesity: Provide  education and appropriate resources to help participant work on and attain dietary goals.;Weight Management/Obesity: Establish reasonable short term and long term weight goals.;Weight Management: Provide education and appropriate resources to help participant work on and attain dietary goals.    Admit Weight 357 lb 8 oz (162.2 kg)    Goal Weight: Short Term 350 lb (158.8 kg)    Goal Weight: Long Term 240 lb (108.9 kg)    Expected Outcomes Short Term: Continue to assess and modify interventions until short term weight is achieved;Long Term: Adherence to nutrition and physical activity/exercise program aimed toward attainment of established weight goal;Weight Loss: Understanding of general recommendations for a balanced deficit meal plan, which promotes 1-2 lb weight loss per week and includes a negative energy balance of [phone] kcal/d;Understanding recommendations for meals to include 15-35% energy as protein, 25-35% energy from fat, 35-60% energy from carbohydrates, less than 200mg  of dietary cholesterol, 20-35 gm of total fiber daily;Understanding of distribution of calorie intake throughout the day with the consumption of 4-5 meals/snacks    Tobacco Cessation Yes    Number of packs per day 1/4 a pack and continuing to wean down to 0    Intervention Assist the participant in steps to quit. Provide individualized education and counseling about committing to Tobacco Cessation, relapse prevention, and pharmacological support that can be provided by physician.;Education officer, environmental, assist with locating and accessing local/national Quit Smoking programs, and support quit date choice.    Expected Outcomes Short Term: Will demonstrate readiness to quit, by selecting a quit date.;Short Term: Will quit all tobacco product use, adhering to prevention of relapse plan.;Long Term: Complete abstinence from all tobacco products for at least 12 months from quit date.    Heart Failure Yes    Intervention  Provide a combined exercise and nutrition program that is supplemented with education, support and counseling about heart failure. Directed toward relieving symptoms such as shortness of breath, decreased exercise tolerance, and extremity edema.    Expected Outcomes Improve functional capacity of life;Short term: Attendance in program 2-3 days a week with increased exercise capacity. Reported lower sodium intake. Reported increased fruit and vegetable intake. Reports medication compliance.;Short term: Daily weights obtained and reported for increase. Utilizing diuretic protocols set by physician.;Long term: Adoption of self-care skills and reduction of barriers for early signs and symptoms recognition and intervention leading to self-care maintenance.    Hypertension Yes    Intervention Provide education on lifestyle modifcations including regular physical activity/exercise, weight management, moderate sodium restriction and increased consumption of fresh fruit, vegetables, and low fat dairy, alcohol moderation, and smoking cessation.;Monitor prescription use compliance.    Expected Outcomes Short Term: Continued assessment and intervention until BP is < 140/78mm HG in hypertensive participants. < 130/20mm HG in hypertensive participants with diabetes, heart  failure or chronic kidney disease.;Long Term: Maintenance of blood pressure at goal levels.    Lipids Yes    Intervention Provide education and support for participant on nutrition & aerobic/resistive exercise along with prescribed medications to achieve LDL 70mg , HDL >40mg .    Expected Outcomes Short Term: Participant states understanding of desired cholesterol values and is compliant with medications prescribed. Participant is following exercise prescription and nutrition guidelines.;Long Term: Cholesterol controlled with medications as prescribed, with individualized exercise RX and with personalized nutrition plan. Value goals: LDL < 70mg , HDL > 40  mg.             Education:Diabetes - Individual verbal and written instruction to review signs/symptoms of diabetes, desired ranges of glucose level fasting, after meals and with exercise. Acknowledge that pre and post exercise glucose checks will be done for 3 sessions at entry of program.   Core Components/Risk Factors/Patient Goals Review:    Core Components/Risk Factors/Patient Goals at Discharge (Final Review):    ITP Comments:  ITP Comments     Row Name 11/25/23 1409 12/06/23 1117         ITP Comments Virtual orientation call completed today. he has an appointment on Date: 12/06/2023   for EP eval and gym Orientation.  Documentation of diagnosis can be found in [facility] 11/09/2023 .  Jamarquis is a current tobacco user. Intervention for tobacco cessation was provided at the initial medical review. He was asked about readiness to quit and reported he is weaning down from a pack a day and is at 1/2 a pack a day at this time.  No quit date yet.  . Patient was advised and educated about tobacco cessation using combination therapy, tobacco cessation classes, quit line, and quit smoking apps. Patient demonstrated understanding of this material. Staff will continue to provide encouragement and follow up with the patient throughout the program. Completed and gym orientation for cardiac rehab. Initial ITP created and sent for review to Dr. Firman Hughes, Medical Director. Marshon is a current tobacco user. Intervention for tobacco cessation was provided at the initial medical review. He was asked about readiness to quit and reported he is decreasing down his number from 1/2 a pack and is down to 5 cigarettes a day. Patient was advised and educated about tobacco cessation using combination therapy, tobacco cessation classes, quit line, and quit smoking apps. Patient demonstrated understanding of this material. Staff will continue to provide encouragement and follow up with the patient throughout the  program.               Comments: Initial ITP

## 2023-12-06 NOTE — Telephone Encounter (Signed)
 Tolerate 7.5 mg dose well. Ready to increase the dose to Mounjaro  10 mg once a week. FBG ~120 range and lost 8-9 lbs since re-started the treatment. F/u via phone in 3-4 weeks for further dose titration.

## 2023-12-06 NOTE — Patient Instructions (Signed)
 Patient Instructions  Patient Details  Name: Marc [name] MRN: [id number] Date of Birth: [birth year] Referring Provider:  Alwin Baars, DO  Below are your personal goals for exercise, nutrition, and risk factors. Our goal is to help you stay on track towards obtaining and maintaining these goals. We will be discussing your progress on these goals with you throughout the program.  Initial Exercise Prescription:  Initial Exercise Prescription - 12/06/23 1100       Date of Initial Exercise RX and Referring Provider   Date 12/06/23    Referring Provider Alwin Baars, DO      Oxygen   Maintain Oxygen Saturation 88% or higher      Treadmill   MPH 0.7    Grade 0    Minutes 15    METs 1.57      Recumbant Bike   Level 1    RPM 50    Watts 15    Minutes 15    METs 1.57      NuStep   Level 1    SPM 80    Minutes 15    METs 1.57      Arm Ergometer   Level 1    Watts 15    RPM 30    Minutes 15    METs 1.57      T5 Nustep   Level 1    SPM 80    Minutes 15    METs 1.57      Track   Laps 10    Minutes 15    METs 1.54      Prescription Details   Frequency (times per week) 2    Duration Progress to 30 minutes of continuous aerobic without signs/symptoms of physical distress      Intensity   THRR 40-80% of Max Heartrate 108-150    Ratings of Perceived Exertion 11-13    Perceived Dyspnea 0-4      Progression   Progression Continue to progress workloads to maintain intensity without signs/symptoms of physical distress.      Resistance Training   Training Prescription Yes    Weight 4, 7 lb   (4lb for L arm)   Reps 10-15             Exercise Goals: Frequency: Be able to perform aerobic exercise two to three times per week in program working toward 2-5 days per week of home exercise.  Intensity: Work with a perceived exertion of 11 (fairly light) - 15 (hard) while following your exercise prescription.  We will make changes to your prescription  with you as you progress through the program.   Duration: Be able to do 30 to 45 minutes of continuous aerobic exercise in addition to a 5 minute warm-up and a 5 minute cool-down routine.   Nutrition Goals: Your personal nutrition goals will be established when you do your nutrition analysis with the dietician.  The following are general nutrition guidelines to follow: Cholesterol < 200mg /day Sodium < 1500mg /day Fiber: Men under 50 yrs - 38 grams per day  Personal Goals:  Personal Goals and Risk Factors at Admission - 12/06/23 1129       Core Components/Risk Factors/Patient Goals on Admission    Weight Management Yes;Obesity;Weight Loss    Intervention Weight Management: Develop a combined nutrition and exercise program designed to reach desired caloric intake, while maintaining appropriate intake of nutrient and fiber, sodium and fats, and appropriate energy expenditure required for the weight goal.;Obesity: Provide  education and appropriate resources to help participant work on and attain dietary goals.;Weight Management/Obesity: Establish reasonable short term and long term weight goals.;Weight Management: Provide education and appropriate resources to help participant work on and attain dietary goals.    Admit Weight 357 lb 8 oz (162.2 kg)    Goal Weight: Short Term 350 lb (158.8 kg)    Goal Weight: Long Term 240 lb (108.9 kg)    Expected Outcomes Short Term: Continue to assess and modify interventions until short term weight is achieved;Long Term: Adherence to nutrition and physical activity/exercise program aimed toward attainment of established weight goal;Weight Loss: Understanding of general recommendations for a balanced deficit meal plan, which promotes 1-2 lb weight loss per week and includes a negative energy balance of [phone] kcal/d;Understanding recommendations for meals to include 15-35% energy as protein, 25-35% energy from fat, 35-60% energy from carbohydrates, less than  200mg  of dietary cholesterol, 20-35 gm of total fiber daily;Understanding of distribution of calorie intake throughout the day with the consumption of 4-5 meals/snacks    Tobacco Cessation Yes    Number of packs per day 1/4 a pack and continuing to wean down to 0    Intervention Assist the participant in steps to quit. Provide individualized education and counseling about committing to Tobacco Cessation, relapse prevention, and pharmacological support that can be provided by physician.;Education officer, environmental, assist with locating and accessing local/national Quit Smoking programs, and support quit date choice.    Expected Outcomes Short Term: Will demonstrate readiness to quit, by selecting a quit date.;Short Term: Will quit all tobacco product use, adhering to prevention of relapse plan.;Long Term: Complete abstinence from all tobacco products for at least 12 months from quit date.    Heart Failure Yes    Intervention Provide a combined exercise and nutrition program that is supplemented with education, support and counseling about heart failure. Directed toward relieving symptoms such as shortness of breath, decreased exercise tolerance, and extremity edema.    Expected Outcomes Improve functional capacity of life;Short term: Attendance in program 2-3 days a week with increased exercise capacity. Reported lower sodium intake. Reported increased fruit and vegetable intake. Reports medication compliance.;Short term: Daily weights obtained and reported for increase. Utilizing diuretic protocols set by physician.;Long term: Adoption of self-care skills and reduction of barriers for early signs and symptoms recognition and intervention leading to self-care maintenance.    Hypertension Yes    Intervention Provide education on lifestyle modifcations including regular physical activity/exercise, weight management, moderate sodium restriction and increased consumption of fresh fruit, vegetables, and low fat  dairy, alcohol moderation, and smoking cessation.;Monitor prescription use compliance.    Expected Outcomes Short Term: Continued assessment and intervention until BP is < 140/57mm HG in hypertensive participants. < 130/64mm HG in hypertensive participants with diabetes, heart failure or chronic kidney disease.;Long Term: Maintenance of blood pressure at goal levels.    Lipids Yes    Intervention Provide education and support for participant on nutrition & aerobic/resistive exercise along with prescribed medications to achieve LDL 70mg , HDL >40mg .    Expected Outcomes Short Term: Participant states understanding of desired cholesterol values and is compliant with medications prescribed. Participant is following exercise prescription and nutrition guidelines.;Long Term: Cholesterol controlled with medications as prescribed, with individualized exercise RX and with personalized nutrition plan. Value goals: LDL < 70mg , HDL > 40 mg.             Tobacco Use Initial Evaluation: Social History   Tobacco Use  Smoking Status  Every Day   Current packs/day: 0.50   Average packs/day: 0.5 packs/day for 31.3 years (15.7 ttl pk-yrs)   Types: Cigarettes   Start date: 1994  Smokeless Tobacco Never  Tobacco Comments   Under 1/2 pack a day. Was smoking a pack a day.    No quit date, weaning numbers of cigarettes    Exercise Goals and Review:  Exercise Goals     Row Name 12/06/23 1128             Exercise Goals   Increase Physical Activity Yes       Intervention Provide advice, education, support and counseling about physical activity/exercise needs.;Develop an individualized exercise prescription for aerobic and resistive training based on initial evaluation findings, risk stratification, comorbidities and participant's personal goals.       Expected Outcomes Short Term: Attend rehab on a regular basis to increase amount of physical activity.;Long Term: Add in home exercise to make exercise part  of routine and to increase amount of physical activity.;Long Term: Exercising regularly at least 3-5 days a week.       Increase Strength and Stamina Yes       Intervention Provide advice, education, support and counseling about physical activity/exercise needs.;Develop an individualized exercise prescription for aerobic and resistive training based on initial evaluation findings, risk stratification, comorbidities and participant's personal goals.       Expected Outcomes Short Term: Increase workloads from initial exercise prescription for resistance, speed, and METs.;Short Term: Perform resistance training exercises routinely during rehab and add in resistance training at home;Long Term: Improve cardiorespiratory fitness, muscular endurance and strength as measured by increased METs and functional capacity ( )       Able to understand and use rate of perceived exertion (RPE) scale Yes       Intervention Provide education and explanation on how to use RPE scale       Expected Outcomes Short Term: Able to use RPE daily in rehab to express subjective intensity level;Long Term:  Able to use RPE to guide intensity level when exercising independently       Able to understand and use Dyspnea scale Yes       Intervention Provide education and explanation on how to use Dyspnea scale       Expected Outcomes Short Term: Able to use Dyspnea scale daily in rehab to express subjective sense of shortness of breath during exertion;Long Term: Able to use Dyspnea scale to guide intensity level when exercising independently       Knowledge and understanding of Target Heart Rate Range (THRR) Yes       Intervention Provide education and explanation of THRR including how the numbers were predicted and where they are located for reference       Expected Outcomes Short Term: Able to state/look up THRR;Short Term: Able to use daily as guideline for intensity in rehab;Long Term: Able to use THRR to govern intensity when  exercising independently       Able to check pulse independently Yes       Intervention Provide education and demonstration on how to check pulse in carotid and radial arteries.;Review the importance of being able to check your own pulse for safety during independent exercise       Expected Outcomes Short Term: Able to explain why pulse checking is important during independent exercise;Long Term: Able to check pulse independently and accurately       Understanding of Exercise Prescription Yes  Intervention Provide education, explanation, and written materials on patient's individual exercise prescription       Expected Outcomes Short Term: Able to explain program exercise prescription;Long Term: Able to explain home exercise prescription to exercise independently

## 2023-12-07 ENCOUNTER — Encounter: Admitting: *Deleted

## 2023-12-07 DIAGNOSIS — I5022 Chronic systolic (congestive) heart failure: Secondary | ICD-10-CM

## 2023-12-07 DIAGNOSIS — I110 Hypertensive heart disease with heart failure: Secondary | ICD-10-CM | POA: Diagnosis not present

## 2023-12-07 NOTE — Progress Notes (Addendum)
 Daily Session Note  Patient Details  Name: Marc [name] MRN: [id number] Date of Birth: 06/23/1976 Referring Provider:   Flowsheet Row Cardiac Rehab from 12/06/2023 in [facility] Cardiac and Pulmonary Rehab  Referring Provider Alwin Baars, DO       Encounter Date: 12/07/2023  Check In:  Session Check In - 12/07/23 1914       Check-In   Supervising physician immediately available to respond to emergencies See telemetry face sheet for immediately available ER MD    Location [facility]-Cardiac & Pulmonary Rehab    Staff Present Lyell Samuel, MS, Exercise Physiologist;Maxon Conetta BS, Exercise Physiologist;Noah Tickle, BS, Exercise Physiologist;Sai Moura, RN, BSN, CCRP    Virtual Visit No    Medication changes reported     No    Fall or balance concerns reported    No    Warm-up and Cool-down Performed on first and last piece of equipment    Resistance Training Performed Yes    VAD Patient? No    PAD/SET Patient? No      Pain Assessment   Currently in Pain? No/denies                Social History   Tobacco Use  Smoking Status Every Day   Current packs/day: 0.50   Average packs/day: 0.5 packs/day for 31.3 years (15.7 ttl pk-yrs)   Types: Cigarettes   Start date: 1994  Smokeless Tobacco Never  Tobacco Comments   Under 1/2 pack a day. Was smoking a pack a day.    No quit date, weaning numbers of cigarettes    Goals Met:  Exercise tolerated well No report of concerns or symptoms today  Goals Unmet:  Not Applicable  Comments: Pt able to follow exercise prescription today without complaint.  Will continue to monitor for progression. First full day of exercise!  Patient was oriented to gym and equipment including functions, settings, policies, and procedures.  Patient's individual exercise prescription and treatment plan were reviewed.  All starting workloads were established based on the results of the 6 minute walk test done at initial orientation visit.  The  plan for exercise progression was also introduced and progression will be customized based on patient's performance and goals.    Dr. Firman Hughes is Medical Director for [facility] Cardiac Rehabilitation.  Dr. Fuad Aleskerov is Medical Director for [facility] Pulmonary Rehabilitation.

## 2023-12-09 ENCOUNTER — Encounter: Admitting: *Deleted

## 2023-12-09 DIAGNOSIS — I110 Hypertensive heart disease with heart failure: Secondary | ICD-10-CM | POA: Diagnosis not present

## 2023-12-09 DIAGNOSIS — I5022 Chronic systolic (congestive) heart failure: Secondary | ICD-10-CM

## 2023-12-09 NOTE — Progress Notes (Signed)
 Daily Session Note  Patient Details  Name: JERMAIN CURT MRN: [id number] Date of Birth: 06-06-1976 Referring Provider:   Flowsheet Row Cardiac Rehab from 12/06/2023 in [facility] Cardiac and Pulmonary Rehab  Referring Provider Alwin Baars, DO       Encounter Date: 12/09/2023  Check In:  Session Check In - 12/09/23 0931       Check-In   Supervising physician immediately available to respond to emergencies See telemetry face sheet for immediately available ER MD    Location [facility]-Cardiac & Pulmonary Rehab    Staff Present Maxon Conetta BS, Exercise Physiologist;Noah Tickle, BS, Exercise Physiologist;Joseph Hood RCP,RRT,BSRT;Maud Sorenson, RN, BSN, CCRP    Virtual Visit No    Medication changes reported     No    Fall or balance concerns reported    No    Warm-up and Cool-down Performed on first and last piece of equipment    Resistance Training Performed Yes    VAD Patient? No    PAD/SET Patient? No      Pain Assessment   Currently in Pain? No/denies                Social History   Tobacco Use  Smoking Status Every Day   Current packs/day: 0.50   Average packs/day: 0.5 packs/day for 31.3 years (15.7 ttl pk-yrs)   Types: Cigarettes   Start date: 1994  Smokeless Tobacco Never  Tobacco Comments   Under 1/2 pack a day. Was smoking a pack a day.    No quit date, weaning numbers of cigarettes    Goals Met:  Independence with exercise equipment Exercise tolerated well No report of concerns or symptoms today  Goals Unmet:  Not Applicable  Comments: Pt able to follow exercise prescription today without complaint.  Will continue to monitor for progression.    Dr. Firman Hughes is Medical Director for [facility] Cardiac Rehabilitation.  Dr. Fuad Aleskerov is Medical Director for [facility] Pulmonary Rehabilitation.

## 2023-12-14 ENCOUNTER — Encounter: Admitting: *Deleted

## 2023-12-14 DIAGNOSIS — I110 Hypertensive heart disease with heart failure: Secondary | ICD-10-CM | POA: Diagnosis not present

## 2023-12-14 DIAGNOSIS — I5022 Chronic systolic (congestive) heart failure: Secondary | ICD-10-CM

## 2023-12-14 NOTE — Progress Notes (Signed)
 Daily Session Note  Patient Details  Name: Marc [name] MRN: [id number] Date of Birth: 17-Nov-1975 Referring Provider:   Flowsheet Row Cardiac Rehab from 12/06/2023 in [facility] Cardiac and Pulmonary Rehab  Referring Provider Alwin Baars, DO       Encounter Date: 12/14/2023  Check In:  Session Check In - 12/14/23 0908       Check-In   Supervising physician immediately available to respond to emergencies See telemetry face sheet for immediately available ER MD    Location [facility]-Cardiac & Pulmonary Rehab    Staff Present Maud Sorenson, RN, BSN, CCRP;Margaret Best, MS, Exercise Physiologist;Maxon Conetta BS, Exercise Physiologist;Noah Tickle, BS, Exercise Physiologist    Virtual Visit No    Medication changes reported     No    Fall or balance concerns reported    No    Warm-up and Cool-down Performed on first and last piece of equipment    Resistance Training Performed Yes    VAD Patient? No    PAD/SET Patient? No      Pain Assessment   Currently in Pain? No/denies                Social History   Tobacco Use  Smoking Status Every Day   Current packs/day: 0.50   Average packs/day: 0.5 packs/day for 31.3 years (15.7 ttl pk-yrs)   Types: Cigarettes   Start date: 1994  Smokeless Tobacco Never  Tobacco Comments   Under 1/2 pack a day. Was smoking a pack a day.    No quit date, weaning numbers of cigarettes    Goals Met:  Independence with exercise equipment Exercise tolerated well No report of concerns or symptoms today  Goals Unmet:  Not Applicable  Comments: Pt able to follow exercise prescription today without complaint.  Will continue to monitor for progression.    Dr. Firman Hughes is Medical Director for [facility] Cardiac Rehabilitation.  Dr. Fuad Aleskerov is Medical Director for [facility] Pulmonary Rehabilitation.

## 2023-12-16 ENCOUNTER — Encounter

## 2023-12-16 ENCOUNTER — Ambulatory Visit (INDEPENDENT_AMBULATORY_CARE_PROVIDER_SITE_OTHER): Payer: Medicare Other

## 2023-12-16 DIAGNOSIS — I428 Other cardiomyopathies: Secondary | ICD-10-CM

## 2023-12-17 LAB — CUP PACEART REMOTE DEVICE CHECK
Battery Remaining Longevity: 156 mo
Battery Voltage: 3.03 V
Brady Statistic RV Percent Paced: 0.01 %
Date Time Interrogation Session: 20250501185248
HighPow Impedance: 85 Ohm
Implantable Lead Connection Status: 753985
Implantable Lead Implant Date: 20240501
Implantable Lead Location: 753860
Implantable Pulse Generator Implant Date: 20240501
Lead Channel Impedance Value: 266 Ohm
Lead Channel Impedance Value: 342 Ohm
Lead Channel Pacing Threshold Amplitude: 1.125 V
Lead Channel Pacing Threshold Pulse Width: 0.4 ms
Lead Channel Sensing Intrinsic Amplitude: 3.5 mV
Lead Channel Setting Pacing Amplitude: 2 V
Lead Channel Setting Pacing Pulse Width: 0.4 ms
Lead Channel Setting Sensing Sensitivity: 0.3 mV
Zone Setting Status: 755011

## 2023-12-19 ENCOUNTER — Encounter: Payer: Self-pay | Admitting: Cardiology

## 2023-12-21 ENCOUNTER — Encounter: Attending: Cardiology | Admitting: *Deleted

## 2023-12-21 ENCOUNTER — Encounter: Admitting: Cardiology

## 2023-12-21 DIAGNOSIS — I5022 Chronic systolic (congestive) heart failure: Secondary | ICD-10-CM | POA: Insufficient documentation

## 2023-12-21 NOTE — Progress Notes (Signed)
 Daily Session Note  Patient Details  Name: Marc [name] MRN: [id number] Date of Birth: Aug 27, 1975 Referring Provider:   Flowsheet Row Cardiac Rehab from 12/06/2023 in [facility] Cardiac and Pulmonary Rehab  Referring Provider Alwin Baars, DO       Encounter Date: 12/21/2023  Check In:  Session Check In - 12/21/23 0929       Check-In   Supervising physician immediately available to respond to emergencies See telemetry face sheet for immediately available ER MD    Location [facility]-Cardiac & Pulmonary Rehab    Staff Present Lyell Samuel, MS, Exercise Physiologist;Noah Tickle, BS, Exercise Physiologist;Maxon Conetta BS, Exercise Physiologist;Brittinee Risk, RN, BSN, CCRP    Virtual Visit No    Medication changes reported     No    Fall or balance concerns reported    No    Warm-up and Cool-down Performed on first and last piece of equipment    Resistance Training Performed Yes    VAD Patient? No    PAD/SET Patient? No      Pain Assessment   Currently in Pain? No/denies                Social History   Tobacco Use  Smoking Status Every Day   Current packs/day: 0.50   Average packs/day: 0.5 packs/day for 31.3 years (15.7 ttl pk-yrs)   Types: Cigarettes   Start date: 1994  Smokeless Tobacco Never  Tobacco Comments   Under 1/2 pack a day. Was smoking a pack a day.    No quit date, weaning numbers of cigarettes    Goals Met:  Independence with exercise equipment Exercise tolerated well No report of concerns or symptoms today  Goals Unmet:  Not Applicable  Comments: Pt able to follow exercise prescription today without complaint.  Will continue to monitor for progression.    Dr. Firman Hughes is Medical Director for [facility] Cardiac Rehabilitation.  Dr. Fuad Aleskerov is Medical Director for [facility] Pulmonary Rehabilitation.

## 2023-12-22 ENCOUNTER — Encounter: Payer: Self-pay | Admitting: *Deleted

## 2023-12-22 DIAGNOSIS — I5022 Chronic systolic (congestive) heart failure: Secondary | ICD-10-CM

## 2023-12-22 NOTE — Progress Notes (Signed)
 Cardiac Individual Treatment Plan  Patient Details  Name: Marc [name] MRN: [id number] Date of Birth: 05-22-1976 Referring Provider:   Flowsheet Row Cardiac Rehab from 12/06/2023 in [facility] Cardiac and Pulmonary Rehab  Referring Provider Alwin Baars, DO       Initial Encounter Date:  Flowsheet Row Cardiac Rehab from 12/06/2023 in [facility] Cardiac and Pulmonary Rehab  Date 12/06/23       Visit Diagnosis: Chronic systolic heart failure (HCC)  Patient's Home Medications on Admission:  Current Outpatient Medications:    albuterol  (VENTOLIN  HFA) 108 (90 Base) MCG/ACT inhaler, Inhale 2 puffs into the lungs every 6 (six) hours as needed for wheezing or shortness of breath., Disp: 18 g, Rfl: 0   amiodarone  (PACERONE ) 200 MG tablet, Take 2 tablets (400 mg total) by mouth daily. PLEASE CALL OFFICE TO SCHEDULE APPOINTMENT PRIOR TO NEXT REFILL, Disp: 180 tablet, Rfl: 0   aspirin  EC 81 MG tablet, Take 81 mg by mouth daily., Disp: , Rfl:    cyclobenzaprine  (FLEXERIL ) 10 MG tablet, TAKE 1 TABLET BY MOUTH EVERY 8 HOURS AS NEEDED FOR MUSCLE SPASM, Disp: 30 tablet, Rfl: 0   dapagliflozin  propanediol (FARXIGA ) 10 MG TABS tablet, Take 1 tablet (10 mg total) by mouth daily before breakfast., Disp: 90 tablet, Rfl: 3   digoxin  (LANOXIN ) 0.125 MG tablet, Take 1 tablet (0.125 mg total) by mouth daily., Disp: 90 tablet, Rfl: 3   docusate sodium  (COLACE) 100 MG capsule, Take 100 mg by mouth daily as needed for mild constipation., Disp: , Rfl:    hydrocortisone  (ANUSOL -HC) 2.5 % rectal cream, Place 1 Application rectally 2 (two) times daily. (Patient taking differently: Place 1 Application rectally 2 (two) times daily as needed for anal itching or hemorrhoids.), Disp: 30 g, Rfl: 0   metoprolol  succinate (TOPROL  XL) 50 MG 24 hr tablet, Take 1 tablet (50 mg total) by mouth at bedtime., Disp: 30 tablet, Rfl: 3   omeprazole (PRILOSEC) 20 MG capsule, Take 20 mg by mouth daily., Disp: , Rfl:    potassium chloride   SA (KLOR-CON  M) 20 MEQ tablet, Take 1 tablet (20 mEq total) by mouth daily., Disp: 30 tablet, Rfl: 3   rosuvastatin  (CRESTOR ) 5 MG tablet, Take 1 tablet (5 mg total) by mouth daily., Disp: 90 tablet, Rfl: 1   sacubitril -valsartan  (ENTRESTO ) 49-51 MG, Take 1 tablet by mouth 2 (two) times daily., Disp: 180 tablet, Rfl: 3   spironolactone  (ALDACTONE ) 25 MG tablet, Take 1 tablet (25 mg total) by mouth daily., Disp: 100 tablet, Rfl: 2   tirzepatide  (MOUNJARO ) 10 MG/0.5ML Pen, Inject 10 mg into the skin once a week., Disp: 2 mL, Rfl: 0   torsemide  (DEMADEX ) 20 MG tablet, TAKE 80MG  IN THE MORNING AND 40MG  IN THE EVENING, Disp: 180 tablet, Rfl: 3   triamcinolone  cream (KENALOG ) 0.1 %, Apply 1 Application topically 2 (two) times daily., Disp: 30 g, Rfl: 0   umeclidinium-vilanterol (ANORO ELLIPTA ) 62.5-25 MCG/ACT AEPB, Inhale 1 puff into the lungs daily., Disp: 1 each, Rfl: 1  Past Medical History: Past Medical History:  Diagnosis Date   CHF (congestive heart failure) (HCC)    Hypertension     Tobacco Use: Social History   Tobacco Use  Smoking Status Every Day   Current packs/day: 0.50   Average packs/day: 0.5 packs/day for 31.3 years (15.7 ttl pk-yrs)   Types: Cigarettes   Start date: 1994  Smokeless Tobacco Never  Tobacco Comments   Under 1/2 pack a day. Was smoking a pack a day.  No quit date, weaning numbers of cigarettes    Labs: Review Flowsheet  More data exists      Latest Ref Rng & Units 02/24/2022 03/16/2023 04/22/2023 07/01/2023 09/13/2023  Labs for ITP Cardiac and Pulmonary Rehab  Cholestrol 100 - 199 mg/dL - - - 161  -  LDL (calc) 0 - 99 mg/dL - - - 096  -  HDL-C >04 mg/dL - - - 49  -  Trlycerides 0 - 149 mg/dL - - - 540  -  Hemoglobin A1c 4.8 - 5.6 % 6.2  - 6.4  6.0  6.1   Bicarbonate 20.0 - 28.0 mmol/L 20.0 - 28.0 mmol/L - 27.9  27.5  - - -  TCO2 22 - 32 mmol/L 22 - 32 mmol/L - 29  29  - - -  O2 Saturation % % - 56  58  - - -    Details       Multiple values from  one day are sorted in reverse-chronological order          Exercise Target Goals: Exercise Program Goal: Individual exercise prescription set using results from initial 6 min walk test and THRR while considering  patient's activity barriers and safety.   Exercise Prescription Goal: Initial exercise prescription builds to 30-45 minutes a day of aerobic activity, 2-3 days per week.  Home exercise guidelines will be given to patient during program as part of exercise prescription that the participant will acknowledge.   Education: Aerobic Exercise: - Group verbal and visual presentation on the components of exercise prescription. Introduces F.I.T.T principle from ACSM for exercise prescriptions.  Reviews F.I.T.T. principles of aerobic exercise including progression. Written material given at graduation.   Education: Resistance Exercise: - Group verbal and visual presentation on the components of exercise prescription. Introduces F.I.T.T principle from ACSM for exercise prescriptions  Reviews F.I.T.T. principles of resistance exercise including progression. Written material given at graduation.    Education: Exercise & Equipment Safety: - Individual verbal instruction and demonstration of equipment use and safety with use of the equipment. Flowsheet Row Cardiac Rehab from 12/09/2023 in [facility] Cardiac and Pulmonary Rehab  Date 12/06/23  Educator MB  Instruction Review Code 1- Verbalizes Understanding       Education: Exercise Physiology & General Exercise Guidelines: - Group verbal and written instruction with models to review the exercise physiology of the cardiovascular system and associated critical values. Provides general exercise guidelines with specific guidelines to those with heart or lung disease.    Education: Flexibility, Balance, Mind/Body Relaxation: - Group verbal and visual presentation with interactive activity on the components of exercise prescription. Introduces  F.I.T.T principle from ACSM for exercise prescriptions. Reviews F.I.T.T. principles of flexibility and balance exercise training including progression. Also discusses the mind body connection.  Reviews various relaxation techniques to help reduce and manage stress (i.e. Deep breathing, progressive muscle relaxation, and visualization). Balance handout provided to take home. Written material given at graduation.   Activity Barriers & Risk Stratification:  Activity Barriers & Cardiac Risk Stratification - 12/06/23 1123       Activity Barriers & Cardiac Risk Stratification   Activity Barriers Joint Problems;Arthritis   Knees (Prevent from standing long periods of time)   Cardiac Risk Stratification High             6 Minute Walk:  6 Minute Walk     Row Name 12/06/23 1120         6 Minute Walk   Phase Initial  Distance 705 feet     Walk Time 6 minutes     # of Rest Breaks 0     MPH 1.34     METS 1.57     RPE 13     Perceived Dyspnea  1     VO2 Peak 5.49     Symptoms Yes (comment)     Comments Bilateral hip and feet pain (5/10)     Resting HR 67 bpm     Resting BP 104/62     Resting Oxygen Saturation  96 %     Exercise Oxygen Saturation  during 6 min walk 99 %     Max Ex. HR 113 bpm     Max Ex. BP 130/70     2 Minute Post BP 100/68              Oxygen Initial Assessment:   Oxygen Re-Evaluation:   Oxygen Discharge (Final Oxygen Re-Evaluation):   Initial Exercise Prescription:  Initial Exercise Prescription - 12/06/23 1100       Date of Initial Exercise RX and Referring Provider   Date 12/06/23    Referring Provider Alwin Baars, DO      Oxygen   Maintain Oxygen Saturation 88% or higher      Treadmill   MPH 0.7    Grade 0    Minutes 15    METs 1.57      Recumbant Bike   Level 1    RPM 50    Watts 15    Minutes 15    METs 1.57      NuStep   Level 1    SPM 80    Minutes 15    METs 1.57      Arm Ergometer   Level 1    Watts 15     RPM 30    Minutes 15    METs 1.57      T5 Nustep   Level 1    SPM 80    Minutes 15    METs 1.57      Track   Laps 10    Minutes 15    METs 1.54      Prescription Details   Frequency (times per week) 2    Duration Progress to 30 minutes of continuous aerobic without signs/symptoms of physical distress      Intensity   THRR 40-80% of Max Heartrate 108-150    Ratings of Perceived Exertion 11-13    Perceived Dyspnea 0-4      Progression   Progression Continue to progress workloads to maintain intensity without signs/symptoms of physical distress.      Resistance Training   Training Prescription Yes    Weight 4, 7 lb   (4lb for L arm)   Reps 10-15             Perform Capillary Blood Glucose checks as needed.  Exercise Prescription Changes:   Exercise Prescription Changes     Row Name 12/06/23 1100 12/15/23 1500           Response to Exercise   Blood Pressure (Admit) 104/62 118/66      Blood Pressure (Exercise) 130/70 126/64      Blood Pressure (Exit) 100/68 118/60      Heart Rate (Admit) 67 bpm 80 bpm      Heart Rate (Exercise) 113 bpm 109 bpm      Heart Rate (Exit) 72 bpm 69 bpm  Oxygen Saturation (Admit) 96 % --      Oxygen Saturation (Exercise) 99 % --      Oxygen Saturation (Exit) 97 % --      Rating of Perceived Exertion (Exercise) 13 13      Perceived Dyspnea (Exercise) 1 --      Symptoms Bilateral hip and feet pain (5/10) none      Comments results First two days of exercise      Duration Progress to 30 minutes of  aerobic without signs/symptoms of physical distress Continue with 30 min of aerobic exercise without signs/symptoms of physical distress.      Intensity THRR New THRR unchanged        Progression   Progression Continue to progress workloads to maintain intensity without signs/symptoms of physical distress. Continue to progress workloads to maintain intensity without signs/symptoms of physical distress.      Average METs 1.57  1.53        Resistance Training   Training Prescription -- Yes      Weight -- 7 lb  (4lb for L arm)      Reps -- 10-15        Interval Training   Interval Training -- No        NuStep   Level -- 2      Minutes -- 15      METs -- 1.5        Arm Ergometer   Level -- 1.2      Minutes -- 15      METs -- 1        T5 Nustep   Level -- 1      Minutes -- 30      METs -- 1.8        Oxygen   Maintain Oxygen Saturation -- 88% or higher               Exercise Comments:   Exercise Comments     Row Name 12/07/23 1005           Exercise Comments First full day of exercise!  Patient was oriented to gym and equipment including functions, settings, policies, and procedures.  Patient's individual exercise prescription and treatment plan were reviewed.  All starting workloads were established based on the results of the 6 minute walk test done at initial orientation visit.  The plan for exercise progression was also introduced and progression will be customized based on patient's performance and goals.                Exercise Goals and Review:   Exercise Goals     Row Name 12/06/23 1128             Exercise Goals   Increase Physical Activity Yes       Intervention Provide advice, education, support and counseling about physical activity/exercise needs.;Develop an individualized exercise prescription for aerobic and resistive training based on initial evaluation findings, risk stratification, comorbidities and participant's personal goals.       Expected Outcomes Short Term: Attend rehab on a regular basis to increase amount of physical activity.;Long Term: Add in home exercise to make exercise part of routine and to increase amount of physical activity.;Long Term: Exercising regularly at least 3-5 days a week.       Increase Strength and Stamina Yes       Intervention Provide advice, education, support and counseling about physical activity/exercise needs.;Develop an  individualized exercise prescription  for aerobic and resistive training based on initial evaluation findings, risk stratification, comorbidities and participant's personal goals.       Expected Outcomes Short Term: Increase workloads from initial exercise prescription for resistance, speed, and METs.;Short Term: Perform resistance training exercises routinely during rehab and add in resistance training at home;Long Term: Improve cardiorespiratory fitness, muscular endurance and strength as measured by increased METs and functional capacity ( )       Able to understand and use rate of perceived exertion (RPE) scale Yes       Intervention Provide education and explanation on how to use RPE scale       Expected Outcomes Short Term: Able to use RPE daily in rehab to express subjective intensity level;Long Term:  Able to use RPE to guide intensity level when exercising independently       Able to understand and use Dyspnea scale Yes       Intervention Provide education and explanation on how to use Dyspnea scale       Expected Outcomes Short Term: Able to use Dyspnea scale daily in rehab to express subjective sense of shortness of breath during exertion;Long Term: Able to use Dyspnea scale to guide intensity level when exercising independently       Knowledge and understanding of Target Heart Rate Range (THRR) Yes       Intervention Provide education and explanation of THRR including how the numbers were predicted and where they are located for reference       Expected Outcomes Short Term: Able to state/look up THRR;Short Term: Able to use daily as guideline for intensity in rehab;Long Term: Able to use THRR to govern intensity when exercising independently       Able to check pulse independently Yes       Intervention Provide education and demonstration on how to check pulse in carotid and radial arteries.;Review the importance of being able to check your own pulse for safety during independent exercise        Expected Outcomes Short Term: Able to explain why pulse checking is important during independent exercise;Long Term: Able to check pulse independently and accurately       Understanding of Exercise Prescription Yes       Intervention Provide education, explanation, and written materials on patient's individual exercise prescription       Expected Outcomes Short Term: Able to explain program exercise prescription;Long Term: Able to explain home exercise prescription to exercise independently                Exercise Goals Re-Evaluation :  Exercise Goals Re-Evaluation     Row Name 12/07/23 1005 12/15/23 1530           Exercise Goal Re-Evaluation   Exercise Goals Review Understanding of Exercise Prescription;Knowledge and understanding of Target Heart Rate Range (THRR);Able to understand and use rate of perceived exertion (RPE) scale;Able to understand and use Dyspnea scale Increase Physical Activity;Increase Strength and Stamina;Understanding of Exercise Prescription      Comments Reviewed RPE and dyspnea scale, THR and program prescription with pt today.  Pt voiced understanding and was given a copy of goals to take home. Marc [name] is off to a good start in rehab. Marc [name] did well on the T5 nustep machine at level 1 during his first session. Marc [name] also did well on the T4 nustep at level 2 and the arm crank at level 1.2 during his second session. We will continue to monitor his progress in the program.  Expected Outcomes Short: Use RPE daily to regulate intensity. Long: Follow program prescription in THR. Short: Continue to follow current exercise prescription. Long: Continue exercise to improve strength and stamina.               Discharge Exercise Prescription (Final Exercise Prescription Changes):  Exercise Prescription Changes - 12/15/23 1500       Response to Exercise   Blood Pressure (Admit) 118/66    Blood Pressure (Exercise) 126/64    Blood Pressure (Exit) 118/60    Heart  Rate (Admit) 80 bpm    Heart Rate (Exercise) 109 bpm    Heart Rate (Exit) 69 bpm    Rating of Perceived Exertion (Exercise) 13    Symptoms none    Comments First two days of exercise    Duration Continue with 30 min of aerobic exercise without signs/symptoms of physical distress.    Intensity THRR unchanged      Progression   Progression Continue to progress workloads to maintain intensity without signs/symptoms of physical distress.    Average METs 1.53      Resistance Training   Training Prescription Yes    Weight 7 lb   (4lb for L arm)   Reps 10-15      Interval Training   Interval Training No      NuStep   Level 2    Minutes 15    METs 1.5      Arm Ergometer   Level 1.2    Minutes 15    METs 1      T5 Nustep   Level 1    Minutes 30    METs 1.8      Oxygen   Maintain Oxygen Saturation 88% or higher             Nutrition:  Target Goals: Understanding of nutrition guidelines, daily intake of sodium 1500mg , cholesterol 200mg , calories 30% from fat and 7% or less from saturated fats, daily to have 5 or more servings of fruits and vegetables.  Education: All About Nutrition: -Group instruction provided by verbal, written material, interactive activities, discussions, models, and posters to present general guidelines for heart healthy nutrition including fat, fiber, MyPlate, the role of sodium in heart healthy nutrition, utilization of the nutrition label, and utilization of this knowledge for meal planning. Follow up email sent as well. Written material given at graduation.   Biometrics:  Pre Biometrics - 12/06/23 1128       Pre Biometrics   Height 5' 8.5" (1.74 m)    Weight 357 lb 8 oz (162.2 kg)    Waist Circumference 61 inches    Hip Circumference 63 inches    Waist to Hip Ratio 0.97 %    BMI (Calculated) 53.56    Single Leg Stand 6 seconds              Nutrition Therapy Plan and Nutrition Goals:  Nutrition Therapy & Goals - 12/06/23 1348        Nutrition Therapy   Diet Cardiac, Low Na    Protein (specify units) 90    Fiber 30 grams    Whole Grain Foods 3 servings    Saturated Fats 15 max. grams    Fruits and Vegetables 5 servings/day    Sodium 1.5 grams      Personal Nutrition Goals   Nutrition Goal Read labels and reduce sodium intake to below 2300mg . Ideally 1500mg  per day.    Personal Goal #2 Eat 15-30gProtein  and 30-60gCarbs at each meal.    Personal Goal #3 Include more colorful produce, aim for 5-8 servings of fruits and veggies per day    Comments Patient is currently drinking some soda, but has been working on cutting back on juice. Discussed sugary beverages and how they affect his blood sugar and weight. Reccommended Marc [name] switch to water or sugar free alternatives. Marc [name] reports Marc [name] usually only eats one meal. Will snack on sweets or junk foods before bed. Marc [name] reports its a habit and enjoys those foods. Says Marc [name] doesn't eat in the mornings because Marc [name] is on a lot of meds and often doesn't feel like eating. His wife has been trying to get him to eat in the mornings. Marc [name] is willing to work on this, but would like some help. Spoke with him about the goals of morning meals and how to make the meals smaller and easy to pull off even when Marc [name] doesn't feel like eating much. Brainstormed several breakfast ideas that have protein, complex carbs and keep sodium controlled. Provided him with a guideline of less than 1500mg  sodium per day. Marc [name] says Marc [name] doesn't use salt at the table. Reminded him to read labels and make sure the foods Marc [name] is eating are not already very salty. Provided mediterranean diet handout. Educated on types of fats, sources, and how to read labels. Went over balanced plates handout and encouraged more veggies and low calorie dense meals with larger meals.      Intervention Plan   Intervention Prescribe, educate and counsel regarding individualized specific dietary modifications aiming towards targeted core components such as  weight, hypertension, lipid management, diabetes, heart failure and other comorbidities.;Nutrition handout(s) given to patient.    Expected Outcomes Short Term Goal: Understand basic principles of dietary content, such as calories, fat, sodium, cholesterol and nutrients.;Short Term Goal: A plan has been developed with personal nutrition goals set during dietitian appointment.;Long Term Goal: Adherence to prescribed nutrition plan.             Nutrition Assessments:  MEDIFICTS Score Key: >=70 Need to make dietary changes  40-70 Heart Healthy Diet <= 40 Therapeutic Level Cholesterol Diet   Picture Your Plate Scores: <16 Unhealthy dietary pattern with much room for improvement. 41-50 Dietary pattern unlikely to meet recommendations for good health and room for improvement. 51-60 More healthful dietary pattern, with some room for improvement.  >60 Healthy dietary pattern, although there may be some specific behaviors that could be improved.    Nutrition Goals Re-Evaluation:   Nutrition Goals Discharge (Final Nutrition Goals Re-Evaluation):   Psychosocial: Target Goals: Acknowledge presence or absence of significant depression and/or stress, maximize coping skills, provide positive support system. Participant is able to verbalize types and ability to use techniques and skills needed for reducing stress and depression.   Education: Stress, Anxiety, and Depression - Group verbal and visual presentation to define topics covered.  Reviews how body is impacted by stress, anxiety, and depression.  Also discusses healthy ways to reduce stress and to treat/manage anxiety and depression.  Written material given at graduation.   Education: Sleep Hygiene -Provides group verbal and written instruction about how sleep can affect your health.  Define sleep hygiene, discuss sleep cycles and impact of sleep habits. Review good sleep hygiene tips.    Initial Review & Psychosocial Screening:   Initial Psych Review & Screening - 11/25/23 1342       Initial Review   Current issues with None Identified  Family Dynamics   Good Support System? Yes   wife and sister     Barriers   Psychosocial barriers to participate in program There are no identifiable barriers or psychosocial needs.      Screening Interventions   Interventions To provide support and resources with identified psychosocial needs;Provide feedback about the scores to participant    Expected Outcomes Short Term goal: Utilizing psychosocial counselor, staff and physician to assist with identification of specific Stressors or current issues interfering with healing process. Setting desired goal for each stressor or current issue identified.;Long Term Goal: Stressors or current issues are controlled or eliminated.;Short Term goal: Identification and review with participant of any Quality of Life or Depression concerns found by scoring the questionnaire.;Long Term goal: The participant improves quality of Life and PHQ9 Scores as seen by post scores and/or verbalization of changes             Quality of Life Scores:   Scores of 19 and below usually indicate a poorer quality of life in these areas.  A difference of  2-3 points is a clinically meaningful difference.  A difference of 2-3 points in the total score of the Quality of Life Index has been associated with significant improvement in overall quality of life, self-image, physical symptoms, and general health in studies assessing change in quality of life.  PHQ-9: Review Flowsheet  More data exists      12/06/2023 09/16/2023 05/31/2023 05/26/2022 05/25/2022  Depression screen PHQ 2/9  Decreased Interest 1 1 1 1  0  Down, Depressed, Hopeless 0 1 0 0 1  PHQ - 2 Score 1 2 1 1 1   Altered sleeping 2 1 0 1 -  Tired, decreased energy 2 1 2 1  -  Change in appetite 3 0 0 0 -  Feeling bad or failure about yourself  1 0 0 0 -  Trouble concentrating 1 0 0 0 -  Moving  slowly or fidgety/restless 0 0 2 0 -  Suicidal thoughts 0 0 0 0 -  PHQ-9 Score 10 4 5 3  -  Difficult doing work/chores Somewhat difficult Somewhat difficult Somewhat difficult Not difficult at all -   Interpretation of Total Score  Total Score Depression Severity:  1-4 = Minimal depression, 5-9 = Mild depression, 10-14 = Moderate depression, 15-19 = Moderately severe depression, 20-27 = Severe depression   Psychosocial Evaluation and Intervention:  Psychosocial Evaluation - 11/25/23 1410       Psychosocial Evaluation & Interventions   Interventions Encouraged to exercise with the program and follow exercise prescription    Comments THere are no barriers to attending the program.  Marc [name] lives with his wife, adult daughter(austistic/non verbal) and mother in law.  Marc [name] is ready to start the program.  Marc [name] is working on weight loss.  Marc [name] states Marc [name] has no concerns.   Marc [name] is working on tobacco cessation and doe not have a quit date ,;Marc [name] has decreased from a pack to 1/2 a pack aday. Marc [name] is ready to start the program.    Expected Outcomes STG attend all sessions, conntinue working on tobacco cessation and weight loss. Work on exericse progression.  LTG Able to maintain/continue tobacco cessation, weight loss and exercise progression    Continue Psychosocial Services  Follow up required by staff             Psychosocial Re-Evaluation:   Psychosocial Discharge (Final Psychosocial Re-Evaluation):   Vocational Rehabilitation: Provide vocational rehab assistance to qualifying candidates.   Vocational  Rehab Evaluation & Intervention:  Vocational Rehab - 11/25/23 1349       Initial Vocational Rehab Evaluation & Intervention   Assessment shows need for Vocational Rehabilitation No      Vocational Rehab Re-Evaulation   Comments no requests at this time.             Education: Education Goals: Education classes will be provided on a variety of topics geared toward better understanding of  heart health and risk factor modification. Participant will state understanding/return demonstration of topics presented as noted by education test scores.  Learning Barriers/Preferences:   General Cardiac Education Topics:  AED/CPR: - Group verbal and written instruction with the use of models to demonstrate the basic use of the AED with the basic ABC's of resuscitation.   Anatomy and Cardiac Procedures: - Group verbal and visual presentation and models provide information about basic cardiac anatomy and function. Reviews the testing methods done to diagnose heart disease and the outcomes of the test results. Describes the treatment choices: Medical Management, Angioplasty, or Coronary Bypass Surgery for treating various heart conditions including Myocardial Infarction, Angina, Valve Disease, and Cardiac Arrhythmias.  Written material given at graduation.   Medication Safety: - Group verbal and visual instruction to review commonly prescribed medications for heart and lung disease. Reviews the medication, class of the drug, and side effects. Includes the steps to properly store meds and maintain the prescription regimen.  Written material given at graduation.   Intimacy: - Group verbal instruction through game format to discuss how heart and lung disease can affect sexual intimacy. Written material given at graduation..   Know Your Numbers and Heart Failure: - Group verbal and visual instruction to discuss disease risk factors for cardiac and pulmonary disease and treatment options.  Reviews associated critical values for Overweight/Obesity, Hypertension, Cholesterol, and Diabetes.  Discusses basics of heart failure: signs/symptoms and treatments.  Introduces Heart Failure Zone chart for action plan for heart failure.  Written material given at graduation. Flowsheet Row Cardiac Rehab from 12/09/2023 in [facility] Cardiac and Pulmonary Rehab  Date 12/09/23  Educator SB  Instruction Review Code  1- Verbalizes Understanding       Infection Prevention: - Provides verbal and written material to individual with discussion of infection control including proper hand washing and proper equipment cleaning during exercise session. Flowsheet Row Cardiac Rehab from 12/09/2023 in [facility] Cardiac and Pulmonary Rehab  Date 12/06/23  Educator MB  Instruction Review Code 1- Verbalizes Understanding       Falls Prevention: - Provides verbal and written material to individual with discussion of falls prevention and safety. Flowsheet Row Cardiac Rehab from 12/09/2023 in [facility] Cardiac and Pulmonary Rehab  Date 12/06/23  Educator MB  Instruction Review Code 1- Verbalizes Understanding       Other: -Provides group and verbal instruction on various topics (see comments)   Knowledge Questionnaire Score:   Core Components/Risk Factors/Patient Goals at Admission:  Personal Goals and Risk Factors at Admission - 12/06/23 1129       Core Components/Risk Factors/Patient Goals on Admission    Weight Management Yes;Obesity;Weight Loss    Intervention Weight Management: Develop a combined nutrition and exercise program designed to reach desired caloric intake, while maintaining appropriate intake of nutrient and fiber, sodium and fats, and appropriate energy expenditure required for the weight goal.;Obesity: Provide education and appropriate resources to help participant work on and attain dietary goals.;Weight Management/Obesity: Establish reasonable short term and long term weight goals.;Weight Management: Provide education and  appropriate resources to help participant work on and attain dietary goals.    Admit Weight 357 lb 8 oz (162.2 kg)    Goal Weight: Short Term 350 lb (158.8 kg)    Goal Weight: Long Term 240 lb (108.9 kg)    Expected Outcomes Short Term: Continue to assess and modify interventions until short term weight is achieved;Long Term: Adherence to nutrition and physical activity/exercise  program aimed toward attainment of established weight goal;Weight Loss: Understanding of general recommendations for a balanced deficit meal plan, which promotes 1-2 lb weight loss per week and includes a negative energy balance of [phone] kcal/d;Understanding recommendations for meals to include 15-35% energy as protein, 25-35% energy from fat, 35-60% energy from carbohydrates, less than 200mg  of dietary cholesterol, 20-35 gm of total fiber daily;Understanding of distribution of calorie intake throughout the day with the consumption of 4-5 meals/snacks    Tobacco Cessation Yes    Number of packs per day 1/4 a pack and continuing to wean down to 0    Intervention Assist the participant in steps to quit. Provide individualized education and counseling about committing to Tobacco Cessation, relapse prevention, and pharmacological support that can be provided by physician.;Education officer, environmental, assist with locating and accessing local/national Quit Smoking programs, and support quit date choice.    Expected Outcomes Short Term: Will demonstrate readiness to quit, by selecting a quit date.;Short Term: Will quit all tobacco product use, adhering to prevention of relapse plan.;Long Term: Complete abstinence from all tobacco products for at least 12 months from quit date.    Heart Failure Yes    Intervention Provide a combined exercise and nutrition program that is supplemented with education, support and counseling about heart failure. Directed toward relieving symptoms such as shortness of breath, decreased exercise tolerance, and extremity edema.    Expected Outcomes Improve functional capacity of life;Short term: Attendance in program 2-3 days a week with increased exercise capacity. Reported lower sodium intake. Reported increased fruit and vegetable intake. Reports medication compliance.;Short term: Daily weights obtained and reported for increase. Utilizing diuretic protocols set by physician.;Long  term: Adoption of self-care skills and reduction of barriers for early signs and symptoms recognition and intervention leading to self-care maintenance.    Hypertension Yes    Intervention Provide education on lifestyle modifcations including regular physical activity/exercise, weight management, moderate sodium restriction and increased consumption of fresh fruit, vegetables, and low fat dairy, alcohol moderation, and smoking cessation.;Monitor prescription use compliance.    Expected Outcomes Short Term: Continued assessment and intervention until BP is < 140/47mm HG in hypertensive participants. < 130/66mm HG in hypertensive participants with diabetes, heart failure or chronic kidney disease.;Long Term: Maintenance of blood pressure at goal levels.    Lipids Yes    Intervention Provide education and support for participant on nutrition & aerobic/resistive exercise along with prescribed medications to achieve LDL 70mg , HDL >40mg .    Expected Outcomes Short Term: Participant states understanding of desired cholesterol values and is compliant with medications prescribed. Participant is following exercise prescription and nutrition guidelines.;Long Term: Cholesterol controlled with medications as prescribed, with individualized exercise RX and with personalized nutrition plan. Value goals: LDL < 70mg , HDL > 40 mg.             Education:Diabetes - Individual verbal and written instruction to review signs/symptoms of diabetes, desired ranges of glucose level fasting, after meals and with exercise. Acknowledge that pre and post exercise glucose checks will be done for 3 sessions at entry of  program.   Core Components/Risk Factors/Patient Goals Review:    Core Components/Risk Factors/Patient Goals at Discharge (Final Review):    ITP Comments:  ITP Comments     Row Name 11/25/23 1409 12/06/23 1117 12/07/23 1005 12/22/23 0825     ITP Comments Virtual orientation call completed today. Marc [name] has an  appointment on Date: 12/06/2023   for EP eval and gym Orientation.  Documentation of diagnosis can be found in [facility] 11/09/2023 .  Delance is a current tobacco user. Intervention for tobacco cessation was provided at the initial medical review. Marc [name] was asked about readiness to quit and reported Marc [name] is weaning down from a pack a day and is at 1/2 a pack a day at this time.  No quit date yet.  . Patient was advised and educated about tobacco cessation using combination therapy, tobacco cessation classes, quit line, and quit smoking apps. Patient demonstrated understanding of this material. Staff will continue to provide encouragement and follow up with the patient throughout the program. Completed and gym orientation for cardiac rehab. Initial ITP created and sent for review to Dr. Firman Hughes, Medical Director. Marc [name] is a current tobacco user. Intervention for tobacco cessation was provided at the initial medical review. Marc [name] was asked about readiness to quit and reported Marc [name] is decreasing down his number from 1/2 a pack and is down to 5 cigarettes a day. Patient was advised and educated about tobacco cessation using combination therapy, tobacco cessation classes, quit line, and quit smoking apps. Patient demonstrated understanding of this material. Staff will continue to provide encouragement and follow up with the patient throughout the program. First full day of exercise!  Patient was oriented to gym and equipment including functions, settings, policies, and procedures.  Patient's individual exercise prescription and treatment plan were reviewed.  All starting workloads were established based on the results of the 6 minute walk test done at initial orientation visit.  The plan for exercise progression was also introduced and progression will be customized based on patient's performance and goals. 30 Day review completed. Medical Director ITP review done, changes made as directed, and signed approval by Medical  Director.    new to program             Comments:

## 2023-12-23 ENCOUNTER — Encounter: Admitting: *Deleted

## 2023-12-23 DIAGNOSIS — I5022 Chronic systolic (congestive) heart failure: Secondary | ICD-10-CM

## 2023-12-23 NOTE — Progress Notes (Signed)
 Daily Session Note  Patient Details  Name: Marc [name] MRN: [id number] Date of Birth: Apr 26, 1976 Referring Provider:   Flowsheet Row Cardiac Rehab from 12/06/2023 in [facility] Cardiac and Pulmonary Rehab  Referring Provider Alwin Baars, DO       Encounter Date: 12/23/2023  Check In:  Session Check In - 12/23/23 1047       Check-In   Supervising physician immediately available to respond to emergencies See telemetry face sheet for immediately available ER MD    Location [facility]-Cardiac & Pulmonary Rehab    Staff Present Maud Sorenson, RN, BSN, CCRP;Joseph Hood RCP,RRT,BSRT;Maxon [place] BS, Exercise Physiologist;Meredith Manson Seitz RN,BSN;Jason Martina Sledge RDN,LDN    Virtual Visit No    Medication changes reported     No    Fall or balance concerns reported    No    Warm-up and Cool-down Performed on first and last piece of equipment    Resistance Training Performed Yes    VAD Patient? No    PAD/SET Patient? No      Pain Assessment   Currently in Pain? No/denies                Social History   Tobacco Use  Smoking Status Every Day   Current packs/day: 0.50   Average packs/day: 0.5 packs/day for 31.3 years (15.7 ttl pk-yrs)   Types: Cigarettes   Start date: 1994  Smokeless Tobacco Never  Tobacco Comments   Under 1/2 pack a day. Was smoking a pack a day.    No quit date, weaning numbers of cigarettes    Goals Met:  Independence with exercise equipment Exercise tolerated well No report of concerns or symptoms today  Goals Unmet:  Not Applicable  Comments: Pt able to follow exercise prescription today without complaint.  Will continue to monitor for progression.    Dr. Firman Hughes is Medical Director for [facility] Cardiac Rehabilitation.  Dr. Fuad Aleskerov is Medical Director for [facility] Pulmonary Rehabilitation.

## 2023-12-28 ENCOUNTER — Encounter: Admitting: *Deleted

## 2023-12-28 DIAGNOSIS — I5022 Chronic systolic (congestive) heart failure: Secondary | ICD-10-CM

## 2023-12-28 NOTE — Progress Notes (Signed)
 Daily Session Note  Patient Details  Name: Marc [name] MRN: [id number] Date of Birth: [birth year] Referring Provider:   Flowsheet Row Cardiac Rehab from 12/06/2023 in [facility] Cardiac and Pulmonary Rehab  Referring Provider Alwin Baars, DO       Encounter Date: 12/28/2023  Check In:  Session Check In - 12/28/23 0936       Check-In   Supervising physician immediately available to respond to emergencies See telemetry face sheet for immediately available ER MD    Location [facility]-Cardiac & Pulmonary Rehab    Staff Present Maud Sorenson, RN, BSN, CCRP;Margaret Best, MS, Exercise Physiologist;Maxon Conetta BS, Exercise Physiologist    Virtual Visit No    Medication changes reported     No    Fall or balance concerns reported    No    Warm-up and Cool-down Performed on first and last piece of equipment    Resistance Training Performed Yes    VAD Patient? No    PAD/SET Patient? No      Pain Assessment   Currently in Pain? No/denies                Social History   Tobacco Use  Smoking Status Every Day   Current packs/day: 0.50   Average packs/day: 0.5 packs/day for 31.4 years (15.7 ttl pk-yrs)   Types: Cigarettes   Start date: 1994  Smokeless Tobacco Never  Tobacco Comments   Under 1/2 pack a day. Was smoking a pack a day.    No quit date, weaning numbers of cigarettes    Goals Met:  Independence with exercise equipment Exercise tolerated well No report of concerns or symptoms today  Goals Unmet:  Not Applicable  Comments: Pt able to follow exercise prescription today without complaint.  Will continue to monitor for progression.    Dr. Firman Hughes is Medical Director for [facility] Cardiac Rehabilitation.  Dr. Fuad Aleskerov is Medical Director for [facility] Pulmonary Rehabilitation.

## 2023-12-30 ENCOUNTER — Encounter

## 2023-12-31 ENCOUNTER — Telehealth: Payer: Self-pay | Admitting: Cardiology

## 2023-12-31 ENCOUNTER — Other Ambulatory Visit: Payer: Self-pay | Admitting: Cardiology

## 2023-12-31 NOTE — Telephone Encounter (Signed)
 Called to confirm/remind patient of their appointment at [facility] on 12/31/23.   Appointment:   [] Confirmed  [x] Left mess   [] No answer/No voice mail  [] VM Full/unable to leave message  [] Phone not in service  Patient reminded to bring all medications and/or complete list.  Confirmed patient has transportation. Gave directions, instructed to utilize valet parking.

## 2024-01-03 ENCOUNTER — Encounter: Admitting: Cardiology

## 2024-01-04 ENCOUNTER — Encounter

## 2024-01-06 ENCOUNTER — Encounter

## 2024-01-10 IMAGING — CT CT HEAD W/O CM
4 series · 14 of 47 positions shown, 16 images · non-contrast
Comparison: None Available.

CLINICAL DATA: Arm numbness and tingling



[Series 2: axial st head 5.00 ax · axial · 0.36mm/px · z∈[-539,-449]mm · 6 of 26 slices shown, 8 images]
[im 4/26  brain]
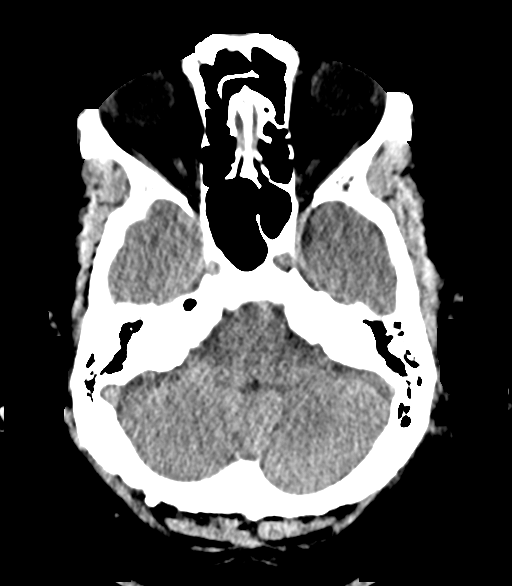
[im 4/26  bone]
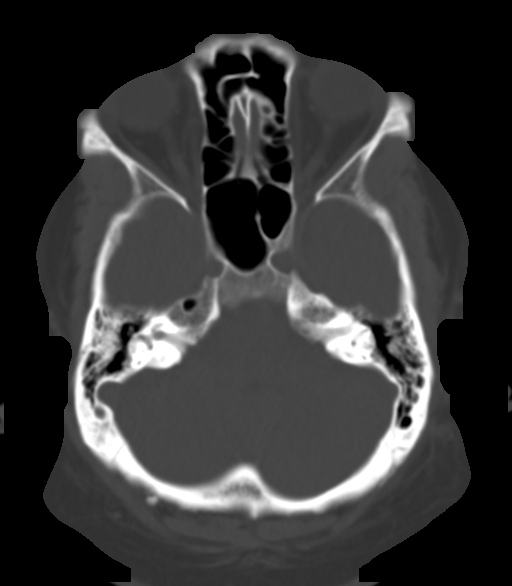
[im 8/26  brain]
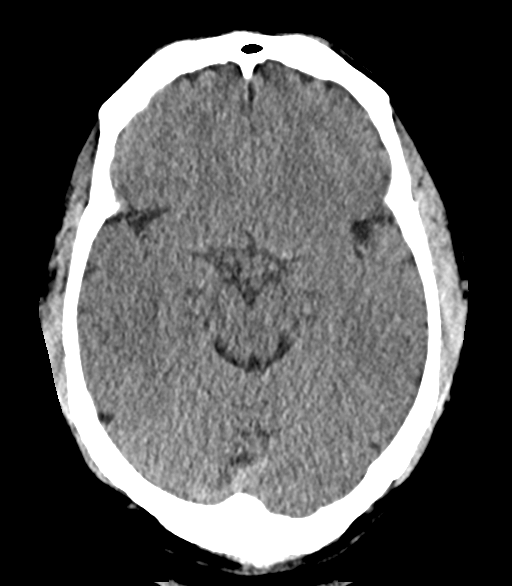
[im 11/26  brain]
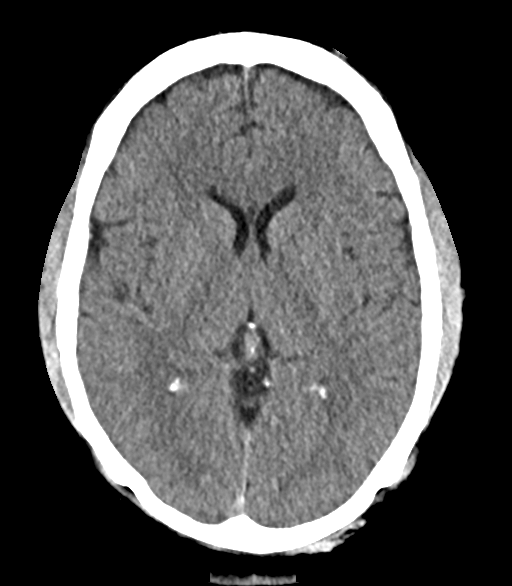
[im 15/26  brain]
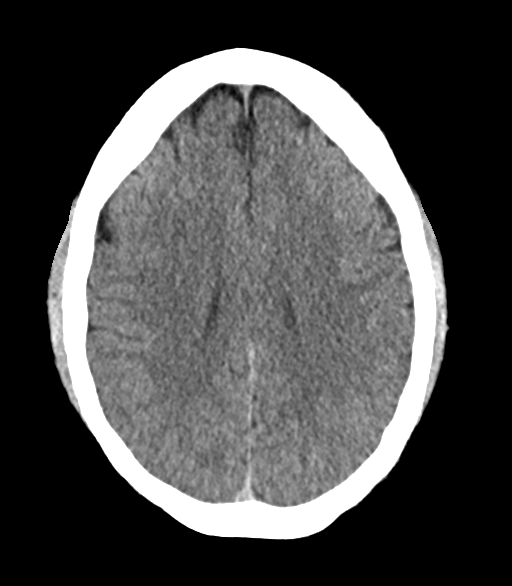
[im 18/26  brain]
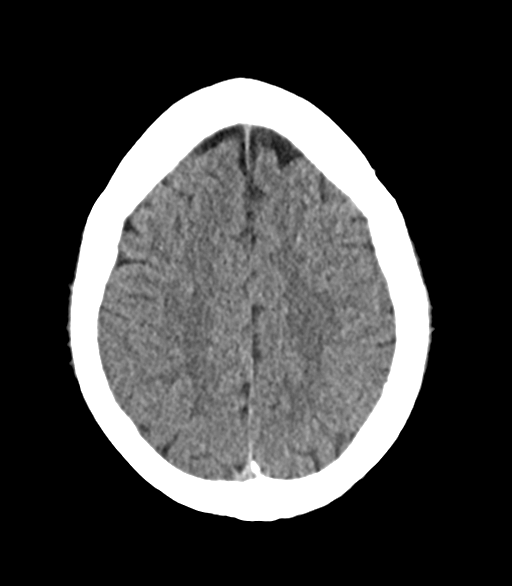
[im 18/26  bone]
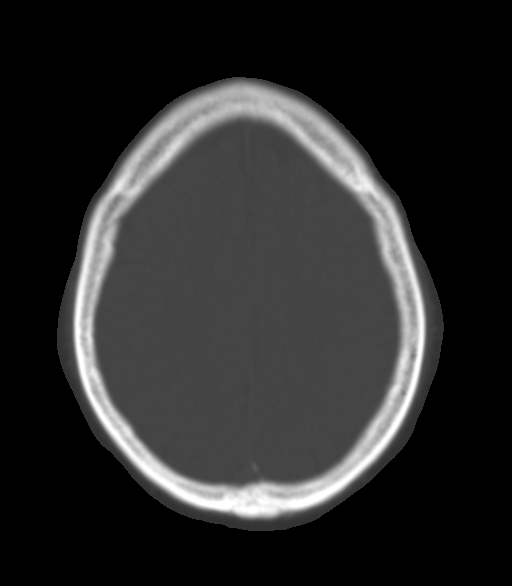
[im 22/26  brain]
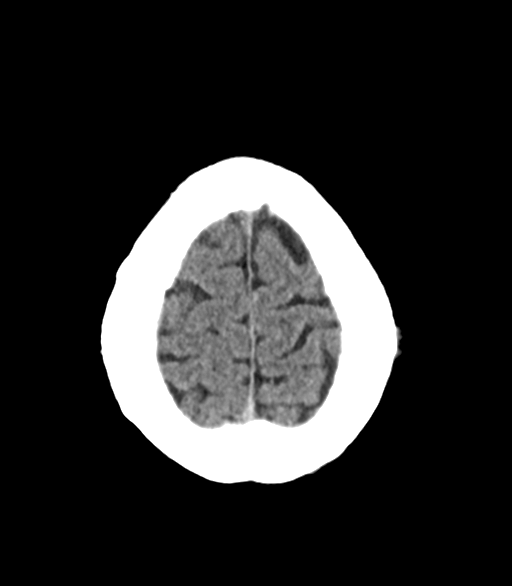

[Series 4: bone windows head 2.00 ax · axial · 0.36mm/px · z∈[-543,-531]mm · 2 of 66 slices shown]
[im 7/66  bone]
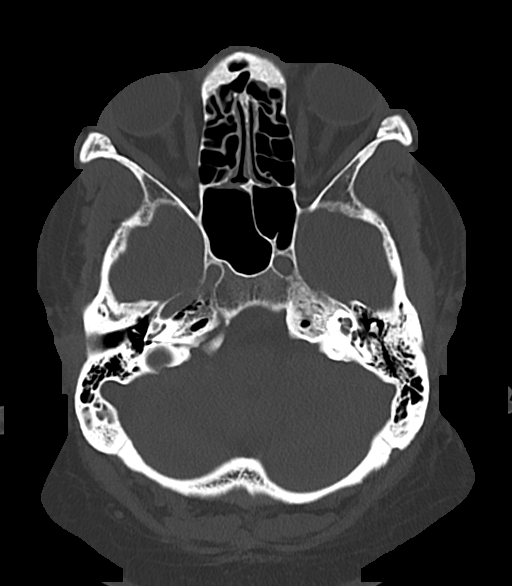
[im 13/66  bone]
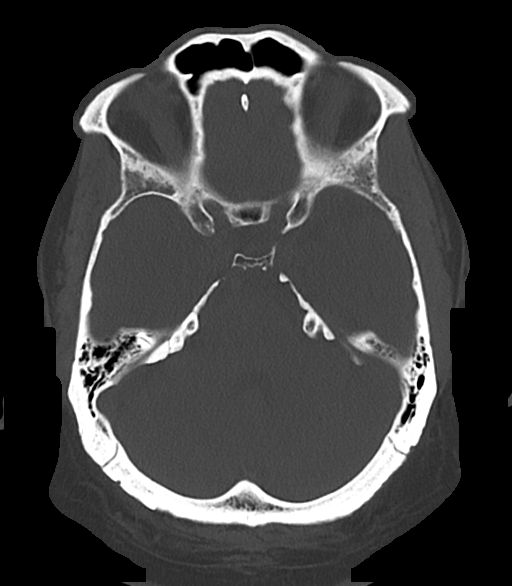

[Series 6: coronals head 3.00 cor · coronal · 0.26mm/px · 3 of 70 slices shown]
[im 24/70  brain]
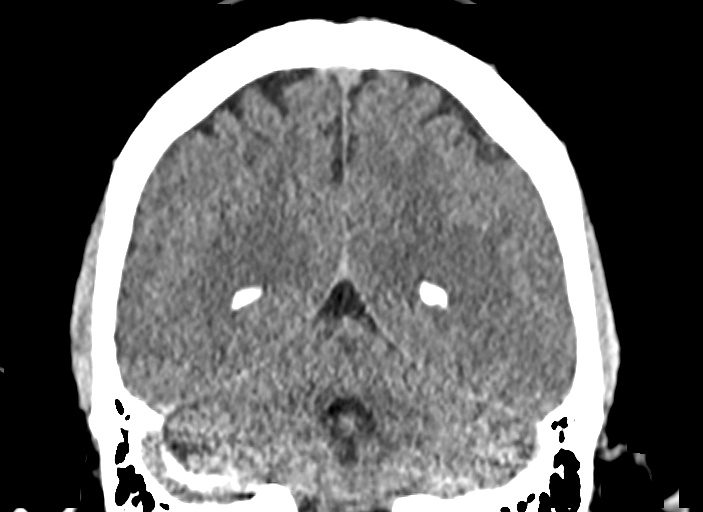
[im 31/70  brain]
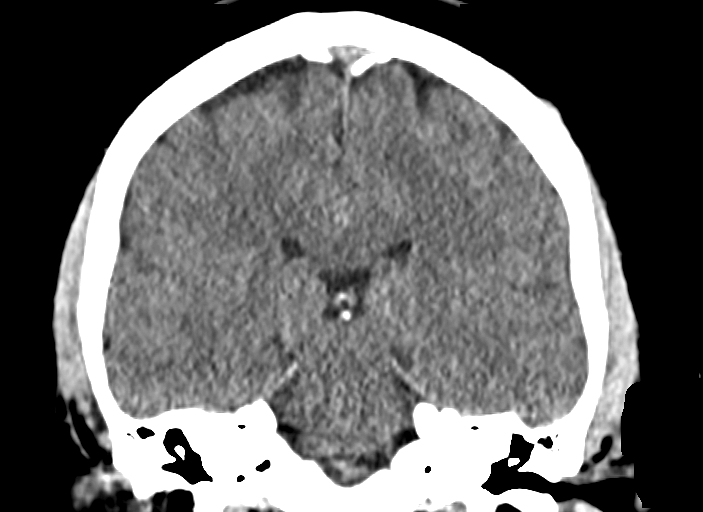
[im 39/70  brain]
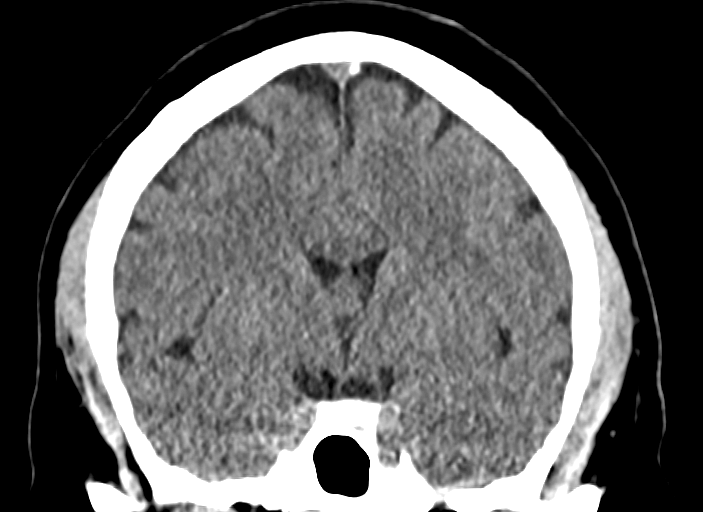

[Series 8: sagittals head 3.00 sag · sagittal · 0.26mm/px · 3 of 61 slices shown]
[im 21/61  brain]
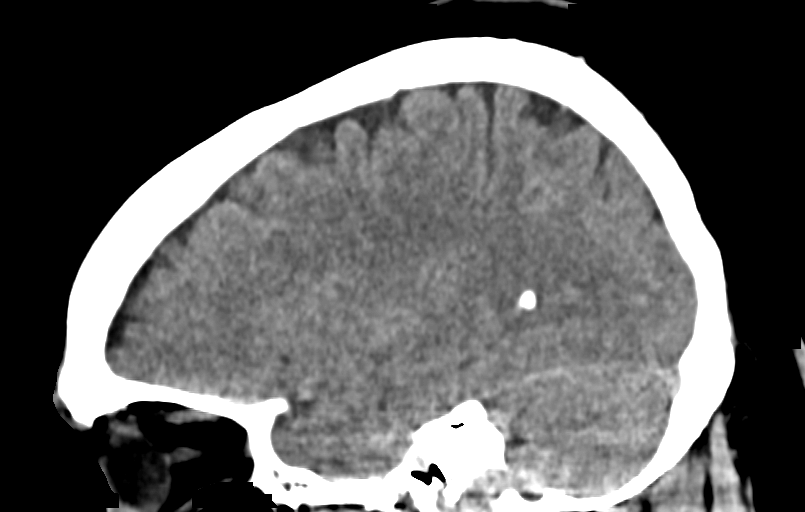
[im 31/61  brain]
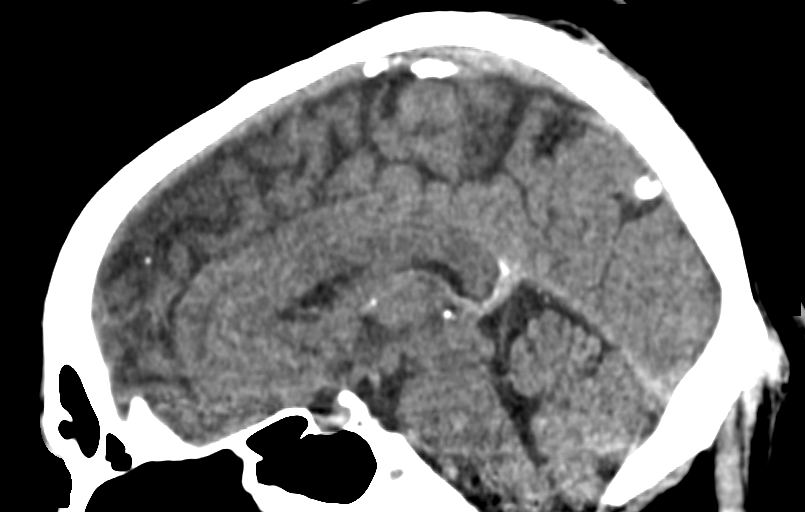
[im 41/61  brain]
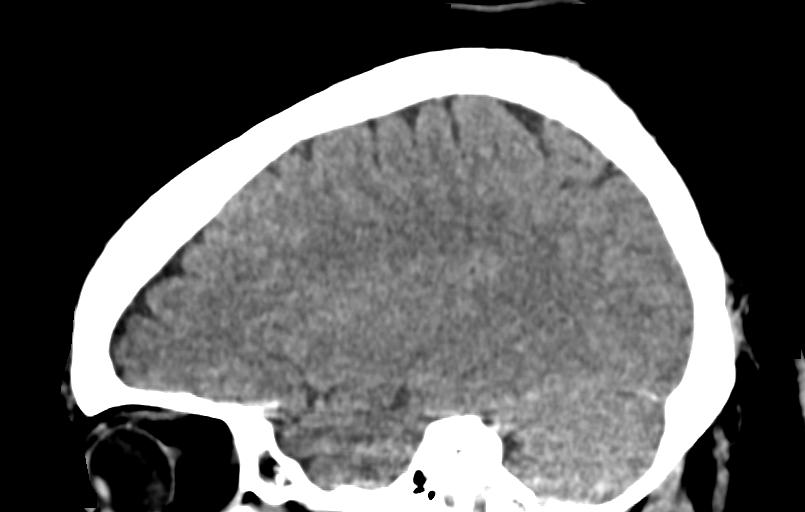

[14 of 47 positions shown; findings below may reference images not displayed]

FINDINGS: Brain: No acute intracranial hemorrhage, mass effect, or herniation.
No extra-axial fluid collections. No evidence of acute territorial
infarct. No hydrocephalus.

Vascular: No hyperdense vessel or unexpected calcification.

Skull: Normal. Negative for fracture or focal lesion.

Sinuses/Orbits: No acute finding.

Other: None.
IMPRESSION: No acute intracranial process identified.

## 2024-01-11 ENCOUNTER — Encounter: Admitting: *Deleted

## 2024-01-11 DIAGNOSIS — I5022 Chronic systolic (congestive) heart failure: Secondary | ICD-10-CM

## 2024-01-11 NOTE — Progress Notes (Signed)
 Daily Session Note  Patient Details  Name: KEANAN MELANDER MRN: [id number] Date of Birth: [birth year] Referring Provider:   Flowsheet Row Cardiac Rehab from 12/06/2023 in [facility] Cardiac and Pulmonary Rehab  Referring Provider Alwin Baars, DO       Encounter Date: 01/11/2024  Check In:  Session Check In - 01/11/24 0930       Check-In   Supervising physician immediately available to respond to emergencies See telemetry face sheet for immediately available ER MD    Location [facility]-Cardiac & Pulmonary Rehab    Staff Present Maud Sorenson, RN, BSN, CCRP;Maxon Conetta BS, Exercise Physiologist;Noah Tickle, BS, Exercise Physiologist;Jason Martina Sledge RDN,LDN    Virtual Visit No    Medication changes reported     No    Fall or balance concerns reported    No    Warm-up and Cool-down Performed on first and last piece of equipment    Resistance Training Performed Yes    VAD Patient? No    PAD/SET Patient? No      Pain Assessment   Currently in Pain? No/denies                Social History   Tobacco Use  Smoking Status Every Day   Current packs/day: 0.50   Average packs/day: 0.5 packs/day for 31.4 years (15.7 ttl pk-yrs)   Types: Cigarettes   Start date: 1994  Smokeless Tobacco Never  Tobacco Comments   Under 1/2 pack a day. Was smoking a pack a day.    No quit date, weaning numbers of cigarettes    Goals Met:  Independence with exercise equipment Exercise tolerated well No report of concerns or symptoms today  Goals Unmet:  Not Applicable  Comments: Pt able to follow exercise prescription today without complaint.  Will continue to monitor for progression.    Dr. Firman Hughes is Medical Director for [facility] Cardiac Rehabilitation.  Dr. Fuad Aleskerov is Medical Director for [facility] Pulmonary Rehabilitation.

## 2024-01-13 ENCOUNTER — Encounter: Admitting: *Deleted

## 2024-01-13 DIAGNOSIS — I5022 Chronic systolic (congestive) heart failure: Secondary | ICD-10-CM | POA: Diagnosis not present

## 2024-01-13 NOTE — Progress Notes (Signed)
 Daily Session Note  Patient Details  Name: Marc [name] MRN: [id number] Date of Birth: 12/07/1975 Referring Provider:   Flowsheet Row Cardiac Rehab from 12/06/2023 in [facility] Cardiac and Pulmonary Rehab  Referring Provider Alwin Baars, DO       Encounter Date: 01/13/2024  Check In:  Session Check In - 01/13/24 0934       Check-In   Supervising physician immediately available to respond to emergencies See telemetry face sheet for immediately available ER MD    Location [facility]-Cardiac & Pulmonary Rehab    Staff Present Maud Sorenson, RN, BSN, CCRP;Joseph Hood RCP,RRT,BSRT;Margaret Best, MS, Exercise Physiologist;Maxon Conetta BS, Exercise Physiologist    Virtual Visit No    Medication changes reported     No    Fall or balance concerns reported    No    Warm-up and Cool-down Performed on first and last piece of equipment    Resistance Training Performed Yes    VAD Patient? No    PAD/SET Patient? No      Pain Assessment   Currently in Pain? No/denies                Social History   Tobacco Use  Smoking Status Every Day   Current packs/day: 0.50   Average packs/day: 0.5 packs/day for 31.4 years (15.7 ttl pk-yrs)   Types: Cigarettes   Start date: 1994  Smokeless Tobacco Never  Tobacco Comments   Under 1/2 pack a day. Was smoking a pack a day.    No quit date, weaning numbers of cigarettes    Goals Met:  Independence with exercise equipment Exercise tolerated well No report of concerns or symptoms today  Goals Unmet:  Not Applicable  Comments: Pt able to follow exercise prescription today without complaint.  Will continue to monitor for progression.    Dr. Firman Hughes is Medical Director for [facility] Cardiac Rehabilitation.  Dr. Fuad Aleskerov is Medical Director for [facility] Pulmonary Rehabilitation.

## 2024-01-18 ENCOUNTER — Encounter: Attending: Cardiology | Admitting: *Deleted

## 2024-01-18 DIAGNOSIS — I5022 Chronic systolic (congestive) heart failure: Secondary | ICD-10-CM | POA: Insufficient documentation

## 2024-01-19 NOTE — Progress Notes (Signed)
 Cardiac Individual Treatment Plan  Patient Details  Name: Marc [name] MRN: [id number] Date of Birth: Feb 09, 1976 Referring Provider:   Flowsheet Row Cardiac Rehab from 12/06/2023 in [facility] Cardiac and Pulmonary Rehab  Referring Provider Alwin Baars, DO       Initial Encounter Date:  Flowsheet Row Cardiac Rehab from 12/06/2023 in [facility] Cardiac and Pulmonary Rehab  Date 12/06/23       Visit Diagnosis: Chronic systolic heart failure (HCC)  Patient's Home Medications on Admission:  Current Outpatient Medications:    albuterol  (VENTOLIN  HFA) 108 (90 Base) MCG/ACT inhaler, Inhale 2 puffs into the lungs every 6 (six) hours as needed for wheezing or shortness of breath., Disp: 18 g, Rfl: 0   amiodarone  (PACERONE ) 200 MG tablet, Take 2 tablets (400 mg total) by mouth daily. PLEASE CALL OFFICE TO SCHEDULE APPOINTMENT PRIOR TO NEXT REFILL, Disp: 180 tablet, Rfl: 0   aspirin  EC 81 MG tablet, Take 81 mg by mouth daily., Disp: , Rfl:    cyclobenzaprine  (FLEXERIL ) 10 MG tablet, TAKE 1 TABLET BY MOUTH EVERY 8 HOURS AS NEEDED FOR MUSCLE SPASM, Disp: 30 tablet, Rfl: 0   dapagliflozin  propanediol (FARXIGA ) 10 MG TABS tablet, Take 1 tablet (10 mg total) by mouth daily before breakfast., Disp: 90 tablet, Rfl: 3   digoxin  (LANOXIN ) 0.125 MG tablet, Take 1 tablet (0.125 mg total) by mouth daily., Disp: 90 tablet, Rfl: 3   docusate sodium  (COLACE) 100 MG capsule, Take 100 mg by mouth daily as needed for mild constipation., Disp: , Rfl:    hydrocortisone  (ANUSOL -HC) 2.5 % rectal cream, Place 1 Application rectally 2 (two) times daily. (Patient taking differently: Place 1 Application rectally 2 (two) times daily as needed for anal itching or hemorrhoids.), Disp: 30 g, Rfl: 0   metoprolol  succinate (TOPROL  XL) 50 MG 24 hr tablet, Take 1 tablet (50 mg total) by mouth at bedtime., Disp: 30 tablet, Rfl: 3   omeprazole (PRILOSEC) 20 MG capsule, Take 20 mg by mouth daily., Disp: , Rfl:    potassium chloride   SA (KLOR-CON  M) 20 MEQ tablet, Take 1 tablet (20 mEq total) by mouth daily., Disp: 30 tablet, Rfl: 3   rosuvastatin  (CRESTOR ) 5 MG tablet, Take 1 tablet (5 mg total) by mouth daily., Disp: 90 tablet, Rfl: 1   sacubitril -valsartan  (ENTRESTO ) 49-51 MG, Take 1 tablet by mouth 2 (two) times daily., Disp: 180 tablet, Rfl: 3   spironolactone  (ALDACTONE ) 25 MG tablet, Take 1 tablet (25 mg total) by mouth daily., Disp: 100 tablet, Rfl: 2   tirzepatide  (MOUNJARO ) 12.5 MG/0.5ML Pen, Inject 12.5 mg into the skin once a week., Disp: 2 mL, Rfl: 0   torsemide  (DEMADEX ) 20 MG tablet, TAKE 80MG  IN THE MORNING AND 40MG  IN THE EVENING, Disp: 180 tablet, Rfl: 3   triamcinolone  cream (KENALOG ) 0.1 %, Apply 1 Application topically 2 (two) times daily., Disp: 30 g, Rfl: 0   umeclidinium-vilanterol (ANORO ELLIPTA ) 62.5-25 MCG/ACT AEPB, Inhale 1 puff into the lungs daily., Disp: 1 each, Rfl: 1  Past Medical History: Past Medical History:  Diagnosis Date   CHF (congestive heart failure) (HCC)    Hypertension     Tobacco Use: Social History   Tobacco Use  Smoking Status Every Day   Current packs/day: 0.50   Average packs/day: 0.5 packs/day for 31.4 years (15.7 ttl pk-yrs)   Types: Cigarettes   Start date: 1994  Smokeless Tobacco Never  Tobacco Comments   Under 1/2 pack a day. Was smoking a pack a day.  No quit date, weaning numbers of cigarettes    Labs: Review Flowsheet  More data exists      Latest Ref Rng & Units 02/24/2022 03/16/2023 04/22/2023 07/01/2023 09/13/2023  Labs for ITP Cardiac and Pulmonary Rehab  Cholestrol 100 - 199 mg/dL - - - 540  -  LDL (calc) 0 - 99 mg/dL - - - 981  -  HDL-C >19 mg/dL - - - 49  -  Trlycerides 0 - 149 mg/dL - - - 147  -  Hemoglobin A1c 4.8 - 5.6 % 6.2  - 6.4  6.0  6.1   Bicarbonate 20.0 - 28.0 mmol/L 20.0 - 28.0 mmol/L - 27.9  27.5  - - -  TCO2 22 - 32 mmol/L 22 - 32 mmol/L - 29  29  - - -  O2 Saturation % % - 56  58  - - -    Details       Multiple values  from one day are sorted in reverse-chronological order          Exercise Target Goals: Exercise Program Goal: Individual exercise prescription set using results from initial 6 min walk test and THRR while considering  patient's activity barriers and safety.   Exercise Prescription Goal: Initial exercise prescription builds to 30-45 minutes a day of aerobic activity, 2-3 days per week.  Home exercise guidelines will be given to patient during program as part of exercise prescription that the participant will acknowledge.   Education: Aerobic Exercise: - Group verbal and visual presentation on the components of exercise prescription. Introduces F.I.T.T principle from ACSM for exercise prescriptions.  Reviews F.I.T.T. principles of aerobic exercise including progression. Written material given at graduation. Flowsheet Row Cardiac Rehab from 01/13/2024 in [facility] Cardiac and Pulmonary Rehab  Date 01/13/24  Educator MB  Instruction Review Code 1- Verbalizes Understanding       Education: Resistance Exercise: - Group verbal and visual presentation on the components of exercise prescription. Introduces F.I.T.T principle from ACSM for exercise prescriptions  Reviews F.I.T.T. principles of resistance exercise including progression. Written material given at graduation.    Education: Exercise & Equipment Safety: - Individual verbal instruction and demonstration of equipment use and safety with use of the equipment. Flowsheet Row Cardiac Rehab from 01/13/2024 in [facility] Cardiac and Pulmonary Rehab  Date 12/06/23  Educator MB  Instruction Review Code 1- Verbalizes Understanding       Education: Exercise Physiology & General Exercise Guidelines: - Group verbal and written instruction with models to review the exercise physiology of the cardiovascular system and associated critical values. Provides general exercise guidelines with specific guidelines to those with heart or lung disease.     Education: Flexibility, Balance, Mind/Body Relaxation: - Group verbal and visual presentation with interactive activity on the components of exercise prescription. Introduces F.I.T.T principle from ACSM for exercise prescriptions. Reviews F.I.T.T. principles of flexibility and balance exercise training including progression. Also discusses the mind body connection.  Reviews various relaxation techniques to help reduce and manage stress (i.e. Deep breathing, progressive muscle relaxation, and visualization). Balance handout provided to take home. Written material given at graduation.   Activity Barriers & Risk Stratification:  Activity Barriers & Cardiac Risk Stratification - 12/06/23 1123       Activity Barriers & Cardiac Risk Stratification   Activity Barriers Joint Problems;Arthritis   Knees (Prevent from standing long periods of time)   Cardiac Risk Stratification High             6 Minute  Walk:  6 Minute Walk     Row Name 12/06/23 1120         6 Minute Walk   Phase Initial     Distance 705 feet     Walk Time 6 minutes     # of Rest Breaks 0     MPH 1.34     METS 1.57     RPE 13     Perceived Dyspnea  1     VO2 Peak 5.49     Symptoms Yes (comment)     Comments Bilateral hip and feet pain (5/10)     Resting HR 67 bpm     Resting BP 104/62     Resting Oxygen Saturation  96 %     Exercise Oxygen Saturation  during 6 min walk 99 %     Max Ex. HR 113 bpm     Max Ex. BP 130/70     2 Minute Post BP 100/68              Oxygen Initial Assessment:   Oxygen Re-Evaluation:   Oxygen Discharge (Final Oxygen Re-Evaluation):   Initial Exercise Prescription:  Initial Exercise Prescription - 12/06/23 1100       Date of Initial Exercise RX and Referring Provider   Date 12/06/23    Referring Provider Alwin Baars, DO      Oxygen   Maintain Oxygen Saturation 88% or higher      Treadmill   MPH 0.7    Grade 0    Minutes 15    METs 1.57       Recumbant Bike   Level 1    RPM 50    Watts 15    Minutes 15    METs 1.57      NuStep   Level 1    SPM 80    Minutes 15    METs 1.57      Arm Ergometer   Level 1    Watts 15    RPM 30    Minutes 15    METs 1.57      T5 Nustep   Level 1    SPM 80    Minutes 15    METs 1.57      Track   Laps 10    Minutes 15    METs 1.54      Prescription Details   Frequency (times per week) 2    Duration Progress to 30 minutes of continuous aerobic without signs/symptoms of physical distress      Intensity   THRR 40-80% of Max Heartrate 108-150    Ratings of Perceived Exertion 11-13    Perceived Dyspnea 0-4      Progression   Progression Continue to progress workloads to maintain intensity without signs/symptoms of physical distress.      Resistance Training   Training Prescription Yes    Weight 4, 7 lb   (4lb for L arm)   Reps 10-15             Perform Capillary Blood Glucose checks as needed.  Exercise Prescription Changes:   Exercise Prescription Changes     Row Name 12/06/23 1100 12/15/23 1500 12/31/23 1100 01/11/24 1300       Response to Exercise   Blood Pressure (Admit) 104/62 118/66 114/66 100/60    Blood Pressure (Exercise) 130/70 126/64 138/66 124/66    Blood Pressure (Exit) 100/68 118/60 128/68 108/62    Heart Rate (Admit)  67 bpm 80 bpm 94 bpm 77 bpm    Heart Rate (Exercise) 113 bpm 109 bpm 129 bpm 102 bpm    Heart Rate (Exit) 72 bpm 69 bpm 76 bpm 88 bpm    Oxygen Saturation (Admit) 96 % -- -- --    Oxygen Saturation (Exercise) 99 % -- -- --    Oxygen Saturation (Exit) 97 % -- -- --    Rating of Perceived Exertion (Exercise) 13 13 14 12     Perceived Dyspnea (Exercise) 1 -- -- --    Symptoms Bilateral hip and feet pain (5/10) none hip pain none    Comments results First two days of exercise -- --    Duration Progress to 30 minutes of  aerobic without signs/symptoms of physical distress Continue with 30 min of aerobic exercise without  signs/symptoms of physical distress. Continue with 30 min of aerobic exercise without signs/symptoms of physical distress. Continue with 30 min of aerobic exercise without signs/symptoms of physical distress.    Intensity THRR New THRR unchanged THRR unchanged THRR unchanged      Progression   Progression Continue to progress workloads to maintain intensity without signs/symptoms of physical distress. Continue to progress workloads to maintain intensity without signs/symptoms of physical distress. Continue to progress workloads to maintain intensity without signs/symptoms of physical distress. Continue to progress workloads to maintain intensity without signs/symptoms of physical distress.    Average METs 1.57 1.53 1.53 1.45      Resistance Training   Training Prescription -- Yes Yes Yes    Weight -- 7 lb  (4lb for L arm) 5 lb  (4lb for L arm) 5 lb  (4lb for L arm)    Reps -- 10-15 10-15 10-15      Interval Training   Interval Training -- No No No      Treadmill   MPH -- -- 1 --    Grade -- -- 0 --    Minutes -- -- 15 --    METs -- -- 1.77 --      NuStep   Level -- 2 4 --    Minutes -- 15 15 --    METs -- 1.5 1.6 --      Arm Ergometer   Level -- 1.2 4 2     Minutes -- 15 15 15     METs -- 1 1 1       T5 Nustep   Level -- 1 4 3     Minutes -- 30 15 15     METs -- 1.8 2 1.9      Oxygen   Maintain Oxygen Saturation -- 88% or higher 88% or higher 88% or higher             Exercise Comments:   Exercise Comments     Row Name 12/07/23 1005           Exercise Comments First full day of exercise!  Patient was oriented to gym and equipment including functions, settings, policies, and procedures.  Patient's individual exercise prescription and treatment plan were reviewed.  All starting workloads were established based on the results of the 6 minute walk test done at initial orientation visit.  The plan for exercise progression was also introduced and progression will be  customized based on patient's performance and goals.                Exercise Goals and Review:   Exercise Goals     Row Name 12/06/23 1128  Exercise Goals   Increase Physical Activity Yes       Intervention Provide advice, education, support and counseling about physical activity/exercise needs.;Develop an individualized exercise prescription for aerobic and resistive training based on initial evaluation findings, risk stratification, comorbidities and participant's personal goals.       Expected Outcomes Short Term: Attend rehab on a regular basis to increase amount of physical activity.;Long Term: Add in home exercise to make exercise part of routine and to increase amount of physical activity.;Long Term: Exercising regularly at least 3-5 days a week.       Increase Strength and Stamina Yes       Intervention Provide advice, education, support and counseling about physical activity/exercise needs.;Develop an individualized exercise prescription for aerobic and resistive training based on initial evaluation findings, risk stratification, comorbidities and participant's personal goals.       Expected Outcomes Short Term: Increase workloads from initial exercise prescription for resistance, speed, and METs.;Short Term: Perform resistance training exercises routinely during rehab and add in resistance training at home;Long Term: Improve cardiorespiratory fitness, muscular endurance and strength as measured by increased METs and functional capacity ( )       Able to understand and use rate of perceived exertion (RPE) scale Yes       Intervention Provide education and explanation on how to use RPE scale       Expected Outcomes Short Term: Able to use RPE daily in rehab to express subjective intensity level;Long Term:  Able to use RPE to guide intensity level when exercising independently       Able to understand and use Dyspnea scale Yes       Intervention Provide education and  explanation on how to use Dyspnea scale       Expected Outcomes Short Term: Able to use Dyspnea scale daily in rehab to express subjective sense of shortness of breath during exertion;Long Term: Able to use Dyspnea scale to guide intensity level when exercising independently       Knowledge and understanding of Target Heart Rate Range (THRR) Yes       Intervention Provide education and explanation of THRR including how the numbers were predicted and where they are located for reference       Expected Outcomes Short Term: Able to state/look up THRR;Short Term: Able to use daily as guideline for intensity in rehab;Long Term: Able to use THRR to govern intensity when exercising independently       Able to check pulse independently Yes       Intervention Provide education and demonstration on how to check pulse in carotid and radial arteries.;Review the importance of being able to check your own pulse for safety during independent exercise       Expected Outcomes Short Term: Able to explain why pulse checking is important during independent exercise;Long Term: Able to check pulse independently and accurately       Understanding of Exercise Prescription Yes       Intervention Provide education, explanation, and written materials on patient's individual exercise prescription       Expected Outcomes Short Term: Able to explain program exercise prescription;Long Term: Able to explain home exercise prescription to exercise independently                Exercise Goals Re-Evaluation :  Exercise Goals Re-Evaluation     Row Name 12/07/23 1005 12/15/23 1530 12/31/23 1127 01/11/24 1342       Exercise Goal Re-Evaluation   Exercise  Goals Review Understanding of Exercise Prescription;Knowledge and understanding of Target Heart Rate Range (THRR);Able to understand and use rate of perceived exertion (RPE) scale;Able to understand and use Dyspnea scale Increase Physical Activity;Increase Strength and  Stamina;Understanding of Exercise Prescription Increase Physical Activity;Increase Strength and Stamina;Understanding of Exercise Prescription Increase Physical Activity;Increase Strength and Stamina;Understanding of Exercise Prescription    Comments Reviewed RPE and dyspnea scale, THR and program prescription with pt today.  Pt voiced understanding and was given a copy of goals to take home. Duston is off to a good start in rehab. He did well on the T5 nustep machine at level 1 during his first session. He also did well on the T4 nustep at level 2 and the arm crank at level 1.2 during his second session. We will continue to monitor his progress in the program. Dastan is doing well in rehab. He did well on the treadmill at a speed of 1 mph with no incline, while dealing with hip pain. He also improved to level 4 on the T5 nustep, T4 nustep, and arm crank. We will continue to monitor his progress in the program. Sohil only attended one session of rehab since the last review. He was able to continue to work at level 3 on the T5 nustep and level 2 on the arm ergometer. He also continues to do well with 5 lb hand weights for resistance training. We will continue to monitor his progress in the program.    Expected Outcomes Short: Use RPE daily to regulate intensity. Long: Follow program prescription in THR. Short: Continue to follow current exercise prescription. Long: Continue exercise to improve strength and stamina. Short: Continue to walk on the treadmill as tolerated by hip pain. Long: Continue exercise to improve strength and stamina. Short: Attend rehab more consistently. Long: Continue exercise to improve strength and stamina.             Discharge Exercise Prescription (Final Exercise Prescription Changes):  Exercise Prescription Changes - 01/11/24 1300       Response to Exercise   Blood Pressure (Admit) 100/60    Blood Pressure (Exercise) 124/66    Blood Pressure (Exit) 108/62    Heart Rate  (Admit) 77 bpm    Heart Rate (Exercise) 102 bpm    Heart Rate (Exit) 88 bpm    Rating of Perceived Exertion (Exercise) 12    Symptoms none    Duration Continue with 30 min of aerobic exercise without signs/symptoms of physical distress.    Intensity THRR unchanged      Progression   Progression Continue to progress workloads to maintain intensity without signs/symptoms of physical distress.    Average METs 1.45      Resistance Training   Training Prescription Yes    Weight 5 lb   (4lb for L arm)   Reps 10-15      Interval Training   Interval Training No      Arm Ergometer   Level 2    Minutes 15    METs 1      T5 Nustep   Level 3    Minutes 15    METs 1.9      Oxygen   Maintain Oxygen Saturation 88% or higher             Nutrition:  Target Goals: Understanding of nutrition guidelines, daily intake of sodium 1500mg , cholesterol 200mg , calories 30% from fat and 7% or less from saturated fats, daily to have 5 or more  servings of fruits and vegetables.  Education: All About Nutrition: -Group instruction provided by verbal, written material, interactive activities, discussions, models, and posters to present general guidelines for heart healthy nutrition including fat, fiber, MyPlate, the role of sodium in heart healthy nutrition, utilization of the nutrition label, and utilization of this knowledge for meal planning. Follow up email sent as well. Written material given at graduation.   Biometrics:  Pre Biometrics - 12/06/23 1128       Pre Biometrics   Height 5' 8.5" (1.74 m)    Weight 357 lb 8 oz (162.2 kg)    Waist Circumference 61 inches    Hip Circumference 63 inches    Waist to Hip Ratio 0.97 %    BMI (Calculated) 53.56    Single Leg Stand 6 seconds              Nutrition Therapy Plan and Nutrition Goals:  Nutrition Therapy & Goals - 12/06/23 1348       Nutrition Therapy   Diet Cardiac, Low Na    Protein (specify units) 90    Fiber 30 grams     Whole Grain Foods 3 servings    Saturated Fats 15 max. grams    Fruits and Vegetables 5 servings/day    Sodium 1.5 grams      Personal Nutrition Goals   Nutrition Goal Read labels and reduce sodium intake to below 2300mg . Ideally 1500mg  per day.    Personal Goal #2 Eat 15-30gProtein and 30-60gCarbs at each meal.    Personal Goal #3 Include more colorful produce, aim for 5-8 servings of fruits and veggies per day    Comments Patient is currently drinking some soda, but has been working on cutting back on juice. Discussed sugary beverages and how they affect his blood sugar and weight. Reccommended he switch to water or sugar free alternatives. He reports he usually only eats one meal. Will snack on sweets or junk foods before bed. He reports its a habit and enjoys those foods. Says he doesn't eat in the mornings because he is on a lot of meds and often doesn't feel like eating. His wife has been trying to get him to eat in the mornings. He is willing to work on this, but would like some help. Spoke with him about the goals of morning meals and how to make the meals smaller and easy to pull off even when he doesn't feel like eating much. Brainstormed several breakfast ideas that have protein, complex carbs and keep sodium controlled. Provided him with a guideline of less than 1500mg  sodium per day. He says he doesn't use salt at the table. Reminded him to read labels and make sure the foods he is eating are not already very salty. Provided mediterranean diet handout. Educated on types of fats, sources, and how to read labels. Went over balanced plates handout and encouraged more veggies and low calorie dense meals with larger meals.      Intervention Plan   Intervention Prescribe, educate and counsel regarding individualized specific dietary modifications aiming towards targeted core components such as weight, hypertension, lipid management, diabetes, heart failure and other comorbidities.;Nutrition  handout(s) given to patient.    Expected Outcomes Short Term Goal: Understand basic principles of dietary content, such as calories, fat, sodium, cholesterol and nutrients.;Short Term Goal: A plan has been developed with personal nutrition goals set during dietitian appointment.;Long Term Goal: Adherence to prescribed nutrition plan.  Nutrition Assessments:  MEDIFICTS Score Key: >=70 Need to make dietary changes  40-70 Heart Healthy Diet <= 40 Therapeutic Level Cholesterol Diet   Picture Your Plate Scores: <81 Unhealthy dietary pattern with much room for improvement. 41-50 Dietary pattern unlikely to meet recommendations for good health and room for improvement. 51-60 More healthful dietary pattern, with some room for improvement.  >60 Healthy dietary pattern, although there may be some specific behaviors that could be improved.    Nutrition Goals Re-Evaluation:  Nutrition Goals Re-Evaluation     Row Name 01/11/24 0947             Goals   Comment Ojas's Mounjaro  dose was increased and he struggles to eat much. Currently eating one meal per day, says some days he doesn't want to eat that meal. Reminded him of discussion had with RD about eating smaller more frequent meals or snacks, even when not hungry because Moujaro is very effective at reducing appetite.       Expected Outcome STG: Eat small frequent meals/snacks. LTG: Follow a heart healthy diet that supports body's needs                Nutrition Goals Discharge (Final Nutrition Goals Re-Evaluation):  Nutrition Goals Re-Evaluation - 01/11/24 0947       Goals   Comment Lamonta's Mounjaro  dose was increased and he struggles to eat much. Currently eating one meal per day, says some days he doesn't want to eat that meal. Reminded him of discussion had with RD about eating smaller more frequent meals or snacks, even when not hungry because Moujaro is very effective at reducing appetite.    Expected Outcome  STG: Eat small frequent meals/snacks. LTG: Follow a heart healthy diet that supports body's needs             Psychosocial: Target Goals: Acknowledge presence or absence of significant depression and/or stress, maximize coping skills, provide positive support system. Participant is able to verbalize types and ability to use techniques and skills needed for reducing stress and depression.   Education: Stress, Anxiety, and Depression - Group verbal and visual presentation to define topics covered.  Reviews how body is impacted by stress, anxiety, and depression.  Also discusses healthy ways to reduce stress and to treat/manage anxiety and depression.  Written material given at graduation.   Education: Sleep Hygiene -Provides group verbal and written instruction about how sleep can affect your health.  Define sleep hygiene, discuss sleep cycles and impact of sleep habits. Review good sleep hygiene tips.    Initial Review & Psychosocial Screening:  Initial Psych Review & Screening - 11/25/23 1342       Initial Review   Current issues with None Identified      Family Dynamics   Good Support System? Yes   wife and sister     Barriers   Psychosocial barriers to participate in program There are no identifiable barriers or psychosocial needs.      Screening Interventions   Interventions To provide support and resources with identified psychosocial needs;Provide feedback about the scores to participant    Expected Outcomes Short Term goal: Utilizing psychosocial counselor, staff and physician to assist with identification of specific Stressors or current issues interfering with healing process. Setting desired goal for each stressor or current issue identified.;Long Term Goal: Stressors or current issues are controlled or eliminated.;Short Term goal: Identification and review with participant of any Quality of Life or Depression concerns found by scoring the questionnaire.;Long  Term goal: The  participant improves quality of Life and PHQ9 Scores as seen by post scores and/or verbalization of changes             Quality of Life Scores:   Scores of 19 and below usually indicate a poorer quality of life in these areas.  A difference of  2-3 points is a clinically meaningful difference.  A difference of 2-3 points in the total score of the Quality of Life Index has been associated with significant improvement in overall quality of life, self-image, physical symptoms, and general health in studies assessing change in quality of life.  PHQ-9: Review Flowsheet  More data exists      01/11/2024 12/06/2023 09/16/2023 05/31/2023 05/26/2022  Depression screen PHQ 2/9  Decreased Interest 1 1 1 1 1   Down, Depressed, Hopeless 0 0 1 0 0  PHQ - 2 Score 1 1 2 1 1   Altered sleeping 2 2 1  0 1  Tired, decreased energy 2 2 1 2 1   Change in appetite 3 3 0 0 0  Feeling bad or failure about yourself  1 1 0 0 0  Trouble concentrating 1 1 0 0 0  Moving slowly or fidgety/restless 0 0 0 2 0  Suicidal thoughts 0 0 0 0 0  PHQ-9 Score 10 10 4 5 3   Difficult doing work/chores Somewhat difficult Somewhat difficult Somewhat difficult Somewhat difficult Not difficult at all   Interpretation of Total Score  Total Score Depression Severity:  1-4 = Minimal depression, 5-9 = Mild depression, 10-14 = Moderate depression, 15-19 = Moderately severe depression, 20-27 = Severe depression   Psychosocial Evaluation and Intervention:  Psychosocial Evaluation - 11/25/23 1410       Psychosocial Evaluation & Interventions   Interventions Encouraged to exercise with the program and follow exercise prescription    Comments THere are no barriers to attending the program.  He lives with his wife, adult daughter(austistic/non verbal) and mother in law.  He is ready to start the program.  He is working on weight loss.  He states he has no concerns.   He is working on tobacco cessation and doe not have a quit date ,;he has  decreased from a pack to 1/2 a pack aday. He is ready to start the program.    Expected Outcomes STG attend all sessions, conntinue working on tobacco cessation and weight loss. Work on exericse progression.  LTG Able to maintain/continue tobacco cessation, weight loss and exercise progression    Continue Psychosocial Services  Follow up required by staff             Psychosocial Re-Evaluation:  Psychosocial Re-Evaluation     Row Name 01/11/24 [phone]             Psychosocial Re-Evaluation   Current issues with Current Sleep Concerns;Current Stress Concerns;Current Anxiety/Panic       Comments Completed PHQ-9, scored 10. His appetite has gotten worse with the dose increase in his Mounjaro . He has anxiety and depression ongoing, his medical team is aware and working wit him. Is not sleeping well most nights.       Expected Outcomes STG: Take meds as prescribed, use support systems as needed and focus on good sleep hygiene. LTG: Achieve and maintain a positive outlook on health and daily life       Interventions Encouraged to attend Cardiac Rehabilitation for the exercise       Continue Psychosocial Services  Follow up required by staff  Psychosocial Discharge (Final Psychosocial Re-Evaluation):  Psychosocial Re-Evaluation - 01/11/24 0942       Psychosocial Re-Evaluation   Current issues with Current Sleep Concerns;Current Stress Concerns;Current Anxiety/Panic    Comments Completed PHQ-9, scored 10. His appetite has gotten worse with the dose increase in his Mounjaro . He has anxiety and depression ongoing, his medical team is aware and working wit him. Is not sleeping well most nights.    Expected Outcomes STG: Take meds as prescribed, use support systems as needed and focus on good sleep hygiene. LTG: Achieve and maintain a positive outlook on health and daily life    Interventions Encouraged to attend Cardiac Rehabilitation for the exercise    Continue Psychosocial  Services  Follow up required by staff             Vocational Rehabilitation: Provide vocational rehab assistance to qualifying candidates.   Vocational Rehab Evaluation & Intervention:  Vocational Rehab - 11/25/23 1349       Initial Vocational Rehab Evaluation & Intervention   Assessment shows need for Vocational Rehabilitation No      Vocational Rehab Re-Evaulation   Comments no requests at this time.             Education: Education Goals: Education classes will be provided on a variety of topics geared toward better understanding of heart health and risk factor modification. Participant will state understanding/return demonstration of topics presented as noted by education test scores.  Learning Barriers/Preferences:   General Cardiac Education Topics:  AED/CPR: - Group verbal and written instruction with the use of models to demonstrate the basic use of the AED with the basic ABC's of resuscitation.   Anatomy and Cardiac Procedures: - Group verbal and visual presentation and models provide information about basic cardiac anatomy and function. Reviews the testing methods done to diagnose heart disease and the outcomes of the test results. Describes the treatment choices: Medical Management, Angioplasty, or Coronary Bypass Surgery for treating various heart conditions including Myocardial Infarction, Angina, Valve Disease, and Cardiac Arrhythmias.  Written material given at graduation.   Medication Safety: - Group verbal and visual instruction to review commonly prescribed medications for heart and lung disease. Reviews the medication, class of the drug, and side effects. Includes the steps to properly store meds and maintain the prescription regimen.  Written material given at graduation.   Intimacy: - Group verbal instruction through game format to discuss how heart and lung disease can affect sexual intimacy. Written material given at graduation.. Flowsheet Row  Cardiac Rehab from 01/13/2024 in [facility] Cardiac and Pulmonary Rehab  Date 01/13/24  Educator MB  Instruction Review Code 1- Verbalizes Understanding       Know Your Numbers and Heart Failure: - Group verbal and visual instruction to discuss disease risk factors for cardiac and pulmonary disease and treatment options.  Reviews associated critical values for Overweight/Obesity, Hypertension, Cholesterol, and Diabetes.  Discusses basics of heart failure: signs/symptoms and treatments.  Introduces Heart Failure Zone chart for action plan for heart failure.  Written material given at graduation. Flowsheet Row Cardiac Rehab from 01/13/2024 in [facility] Cardiac and Pulmonary Rehab  Date 12/09/23  Educator SB  Instruction Review Code 1- Verbalizes Understanding       Infection Prevention: - Provides verbal and written material to individual with discussion of infection control including proper hand washing and proper equipment cleaning during exercise session. Flowsheet Row Cardiac Rehab from 01/13/2024 in [facility] Cardiac and Pulmonary Rehab  Date 12/06/23  Educator MB  Instruction Review Code 1- Verbalizes Understanding       Falls Prevention: - Provides verbal and written material to individual with discussion of falls prevention and safety. Flowsheet Row Cardiac Rehab from 01/13/2024 in [facility] Cardiac and Pulmonary Rehab  Date 12/06/23  Educator MB  Instruction Review Code 1- Verbalizes Understanding       Other: -Provides group and verbal instruction on various topics (see comments)   Knowledge Questionnaire Score:   Core Components/Risk Factors/Patient Goals at Admission:  Personal Goals and Risk Factors at Admission - 12/06/23 1129       Core Components/Risk Factors/Patient Goals on Admission    Weight Management Yes;Obesity;Weight Loss    Intervention Weight Management: Develop a combined nutrition and exercise program designed to reach desired caloric intake, while maintaining  appropriate intake of nutrient and fiber, sodium and fats, and appropriate energy expenditure required for the weight goal.;Obesity: Provide education and appropriate resources to help participant work on and attain dietary goals.;Weight Management/Obesity: Establish reasonable short term and long term weight goals.;Weight Management: Provide education and appropriate resources to help participant work on and attain dietary goals.    Admit Weight 357 lb 8 oz (162.2 kg)    Goal Weight: Short Term 350 lb (158.8 kg)    Goal Weight: Long Term 240 lb (108.9 kg)    Expected Outcomes Short Term: Continue to assess and modify interventions until short term weight is achieved;Long Term: Adherence to nutrition and physical activity/exercise program aimed toward attainment of established weight goal;Weight Loss: Understanding of general recommendations for a balanced deficit meal plan, which promotes 1-2 lb weight loss per week and includes a negative energy balance of [phone] kcal/d;Understanding recommendations for meals to include 15-35% energy as protein, 25-35% energy from fat, 35-60% energy from carbohydrates, less than 200mg  of dietary cholesterol, 20-35 gm of total fiber daily;Understanding of distribution of calorie intake throughout the day with the consumption of 4-5 meals/snacks    Tobacco Cessation Yes    Number of packs per day 1/4 a pack and continuing to wean down to 0    Intervention Assist the participant in steps to quit. Provide individualized education and counseling about committing to Tobacco Cessation, relapse prevention, and pharmacological support that can be provided by physician.;Education officer, environmental, assist with locating and accessing local/national Quit Smoking programs, and support quit date choice.    Expected Outcomes Short Term: Will demonstrate readiness to quit, by selecting a quit date.;Short Term: Will quit all tobacco product use, adhering to prevention of relapse  plan.;Long Term: Complete abstinence from all tobacco products for at least 12 months from quit date.    Heart Failure Yes    Intervention Provide a combined exercise and nutrition program that is supplemented with education, support and counseling about heart failure. Directed toward relieving symptoms such as shortness of breath, decreased exercise tolerance, and extremity edema.    Expected Outcomes Improve functional capacity of life;Short term: Attendance in program 2-3 days a week with increased exercise capacity. Reported lower sodium intake. Reported increased fruit and vegetable intake. Reports medication compliance.;Short term: Daily weights obtained and reported for increase. Utilizing diuretic protocols set by physician.;Long term: Adoption of self-care skills and reduction of barriers for early signs and symptoms recognition and intervention leading to self-care maintenance.    Hypertension Yes    Intervention Provide education on lifestyle modifcations including regular physical activity/exercise, weight management, moderate sodium restriction and increased consumption of fresh fruit, vegetables, and low fat dairy, alcohol moderation, and smoking  cessation.;Monitor prescription use compliance.    Expected Outcomes Short Term: Continued assessment and intervention until BP is < 140/17mm HG in hypertensive participants. < 130/26mm HG in hypertensive participants with diabetes, heart failure or chronic kidney disease.;Long Term: Maintenance of blood pressure at goal levels.    Lipids Yes    Intervention Provide education and support for participant on nutrition & aerobic/resistive exercise along with prescribed medications to achieve LDL 70mg , HDL >40mg .    Expected Outcomes Short Term: Participant states understanding of desired cholesterol values and is compliant with medications prescribed. Participant is following exercise prescription and nutrition guidelines.;Long Term: Cholesterol  controlled with medications as prescribed, with individualized exercise RX and with personalized nutrition plan. Value goals: LDL < 70mg , HDL > 40 mg.             Education:Diabetes - Individual verbal and written instruction to review signs/symptoms of diabetes, desired ranges of glucose level fasting, after meals and with exercise. Acknowledge that pre and post exercise glucose checks will be done for 3 sessions at entry of program.   Core Components/Risk Factors/Patient Goals Review:   Goals and Risk Factor Review     Row Name 01/11/24 0949             Core Components/Risk Factors/Patient Goals Review   Personal Goals Review Hypertension       Review Rawlins does not check his blood pressure at home, but he wants to get a blood pressure cuff. Encuraged him to get one and bring into rehab to check its accuracy and walk through how to use it if he needs help.       Expected Outcomes STG: Buy a BP cuff and check BP at home. LTG: manage risk factors independently                Core Components/Risk Factors/Patient Goals at Discharge (Final Review):   Goals and Risk Factor Review - 01/11/24 0949       Core Components/Risk Factors/Patient Goals Review   Personal Goals Review Hypertension    Review Zhane does not check his blood pressure at home, but he wants to get a blood pressure cuff. Encuraged him to get one and bring into rehab to check its accuracy and walk through how to use it if he needs help.    Expected Outcomes STG: Buy a BP cuff and check BP at home. LTG: manage risk factors independently             ITP Comments:  ITP Comments     Row Name 11/25/23 1409 12/06/23 1117 12/07/23 1005 12/22/23 0825 01/19/24 0913   ITP Comments Virtual orientation call completed today. he has an appointment on Date: 12/06/2023   for EP eval and gym Orientation.  Documentation of diagnosis can be found in [facility] 11/09/2023 .  Mahonri is a current tobacco user. Intervention for  tobacco cessation was provided at the initial medical review. He was asked about readiness to quit and reported he is weaning down from a pack a day and is at 1/2 a pack a day at this time.  No quit date yet.  . Patient was advised and educated about tobacco cessation using combination therapy, tobacco cessation classes, quit line, and quit smoking apps. Patient demonstrated understanding of this material. Staff will continue to provide encouragement and follow up with the patient throughout the program. Completed and gym orientation for cardiac rehab. Initial ITP created and sent for review to Dr. Firman Hughes, Medical  Director. Jeran is a current tobacco user. Intervention for tobacco cessation was provided at the initial medical review. He was asked about readiness to quit and reported he is decreasing down his number from 1/2 a pack and is down to 5 cigarettes a day. Patient was advised and educated about tobacco cessation using combination therapy, tobacco cessation classes, quit line, and quit smoking apps. Patient demonstrated understanding of this material. Staff will continue to provide encouragement and follow up with the patient throughout the program. First full day of exercise!  Patient was oriented to gym and equipment including functions, settings, policies, and procedures.  Patient's individual exercise prescription and treatment plan were reviewed.  All starting workloads were established based on the results of the 6 minute walk test done at initial orientation visit.  The plan for exercise progression was also introduced and progression will be customized based on patient's performance and goals. 30 Day review completed. Medical Director ITP review done, changes made as directed, and signed approval by Medical Director.    new to program 30 Day review completed. Medical Director ITP review done, changes made as directed, and signed approval by Medical Director.            Comments:

## 2024-01-20 ENCOUNTER — Encounter

## 2024-01-25 ENCOUNTER — Encounter

## 2024-01-27 ENCOUNTER — Encounter: Admitting: *Deleted

## 2024-01-27 DIAGNOSIS — I5022 Chronic systolic (congestive) heart failure: Secondary | ICD-10-CM | POA: Diagnosis present

## 2024-01-27 NOTE — Progress Notes (Signed)
 Remote ICD transmission.

## 2024-01-27 NOTE — Addendum Note (Signed)
 Addended by: Lott Rouleau A on: 01/27/2024 08:59 AM   Modules accepted: Orders

## 2024-01-27 NOTE — Progress Notes (Signed)
 Daily Session Note  Patient Details  Name: Marc [name] MRN: [id number] Date of Birth: [birth year] Referring Provider:   Flowsheet Row Cardiac Rehab from 12/06/2023 in [facility] Cardiac and Pulmonary Rehab  Referring Provider Alwin Baars, DO    Encounter Date: 01/27/2024  Check In:  Session Check In - 01/27/24 0942       Check-In   Supervising physician immediately available to respond to emergencies See telemetry face sheet for immediately available ER MD    Location [facility]-Cardiac & Pulmonary Rehab    Staff Present Maud Sorenson, RN, BSN, CCRP;Meredith Manson Seitz RN,BSN;Noah Tickle, BS, Exercise Physiologist;Jason Martina Sledge RDN,LDN    Virtual Visit No    Medication changes reported     No    Fall or balance concerns reported    No    Warm-up and Cool-down Performed on first and last piece of equipment    Resistance Training Performed Yes    VAD Patient? No    PAD/SET Patient? No      Pain Assessment   Currently in Pain? No/denies             Social History   Tobacco Use  Smoking Status Every Day   Current packs/day: 0.50   Average packs/day: 0.5 packs/day for 31.4 years (15.7 ttl pk-yrs)   Types: Cigarettes   Start date: 1994  Smokeless Tobacco Never  Tobacco Comments   Under 1/2 pack a day. Was smoking a pack a day.    No quit date, weaning numbers of cigarettes    Goals Met:  Independence with exercise equipment Exercise tolerated well No report of concerns or symptoms today  Goals Unmet:  Not Applicable  Comments: Pt able to follow exercise prescription today without complaint.  Will continue to monitor for progression.    Dr. Firman Hughes is Medical Director for [facility] Cardiac Rehabilitation.  Dr. Fuad Aleskerov is Medical Director for [facility] Pulmonary Rehabilitation.

## 2024-02-01 ENCOUNTER — Encounter

## 2024-02-01 ENCOUNTER — Ambulatory Visit (INDEPENDENT_AMBULATORY_CARE_PROVIDER_SITE_OTHER): Admitting: Nurse Practitioner

## 2024-02-01 ENCOUNTER — Encounter: Payer: Self-pay | Admitting: Nurse Practitioner

## 2024-02-01 VITALS — BP 109/71 | HR 84 | Temp 98.4°F | Wt 345.0 lb

## 2024-02-01 DIAGNOSIS — M109 Gout, unspecified: Secondary | ICD-10-CM

## 2024-02-01 DIAGNOSIS — H60502 Unspecified acute noninfective otitis externa, left ear: Secondary | ICD-10-CM | POA: Diagnosis not present

## 2024-02-01 MED ORDER — COLCHICINE 0.6 MG PO TABS: 0.6000 mg | ORAL_TABLET | Freq: Two times a day (BID) | ORAL | 0 refills | Status: DC

## 2024-02-01 MED ORDER — TRIAMCINOLONE ACETONIDE 40 MG/ML IJ SUSP
40.0000 mg | Freq: Once | INTRAMUSCULAR | Status: AC
  Administered 2024-02-01: 40 mg via INTRAMUSCULAR

## 2024-02-01 MED ORDER — CIPROFLOXACIN-DEXAMETHASONE 0.3-0.1 % OT SUSP: 4.0000 [drp] | Freq: Two times a day (BID) | OTIC | 0 refills | Status: AC

## 2024-02-01 NOTE — Progress Notes (Signed)
 BP 109/71 (BP Location: Right Arm, Patient Position: Sitting, Cuff Size: Large)   Pulse 84   Temp 98.4 F (36.9 C) (Oral)   Wt (!) 345 lb (156.5 kg)   BMI 51.69 kg/m    Subjective:    Patient ID: Marc [name], male    DOB: 1976/05/10, 48 y.o.   MRN: [id number]  HPI: PAL SHELL is a 48 y.o. male  Chief Complaint  Patient presents with   Ear Pain    Always has on the left side. Started back to smell and get it to dry out.    Joint Swelling    Foot and ankle left. Hardly can sleep with the amount of pain. Fell 2 weeks ago but didn't have any pain. Back of heel is very sore. Also mentions pins and needles plantar portion of both feet.    EAR PAIN Duration: weeks Involved ear(s): left Severity:  6/10  Quality:  sore Fever: no Otorrhea: yes Upper respiratory infection symptoms: no Pruritus: no Hearing loss: no Water immersion no Using Q-tips: yes Recurrent otitis media: no Status: worse Treatments attempted: none  LEFT FOOT PAIN Patient states his left foot started hurting two days ago.  He is having trouble walking on it.  States the pain is in the ankle and the back of his heel.  No injury.    Relevant past medical, surgical, family and social history reviewed and updated as indicated. Interim medical history since our last visit reviewed. Allergies and medications reviewed and updated.  Review of Systems  HENT:  Positive for ear pain.        Left ear discharge  Musculoskeletal:        Left foot pain and swelling    Per HPI unless specifically indicated above     Objective:    BP 109/71 (BP Location: Right Arm, Patient Position: Sitting, Cuff Size: Large)   Pulse 84   Temp 98.4 F (36.9 C) (Oral)   Wt (!) 345 lb (156.5 kg)   BMI 51.69 kg/m   Wt Readings from Last 3 Encounters:  02/01/24 (!) 345 lb (156.5 kg)  12/06/23 (!) 357 lb 8 oz (162.2 kg)  11/09/23 (!) 364 lb (165.1 kg)    Physical Exam Vitals and nursing note reviewed.   Constitutional:      General: He is not in acute distress.    Appearance: Normal appearance. He is not ill-appearing, toxic-appearing or diaphoretic.  HENT:     Head: Normocephalic.     Right Ear: External ear normal.     Left Ear: External ear normal. Drainage present.     Nose: Nose normal. No congestion or rhinorrhea.     Mouth/Throat:     Mouth: Mucous membranes are moist.   Eyes:     General:        Right eye: No discharge.        Left eye: No discharge.     Extraocular Movements: Extraocular movements intact.     Conjunctiva/sclera: Conjunctivae normal.     Pupils: Pupils are equal, round, and reactive to light.    Cardiovascular:     Rate and Rhythm: Normal rate and regular rhythm.     Heart sounds: No murmur heard. Pulmonary:     Effort: Pulmonary effort is normal. No respiratory distress.     Breath sounds: Normal breath sounds. No wheezing, rhonchi or rales.  Abdominal:     General: Abdomen is flat. Bowel sounds are normal.  Musculoskeletal:     Cervical back: Normal range of motion and neck supple.  Feet:     Left foot:     Skin integrity: Warmth and dry skin present.     Comments: Swelling and significant pain with palpation.  Skin:    General: Skin is warm and dry.     Capillary Refill: Capillary refill takes less than 2 seconds.   Neurological:     General: No focal deficit present.     Mental Status: He is alert and oriented to person, place, and time.   Psychiatric:        Mood and Affect: Mood normal.        Behavior: Behavior normal.        Thought Content: Thought content normal.        Judgment: Judgment normal.     Results for orders placed or performed in visit on 12/16/23  CUP PACEART REMOTE DEVICE CHECK   Collection Time: 12/16/23  6:52 PM  Result Value Ref Range   Date Time Interrogation Session [id number]    Pulse Generator Manufacturer MERM    Pulse Gen Model [id number] Cobalt XT VR MRI    Pulse Gen Serial Number [id number]     Clinic Name [facility]    Implantable Pulse Generator Type Implantable Cardiac Defibulator    Implantable Pulse Generator Implant Date [id number]    Implantable Lead Manufacturer [facility]    Implantable Lead Model [id number] Sprint Quattro Secure S MRI SureScan    Implantable Lead Serial Number [id number]    Implantable Lead Implant Date [id number]    Implantable Lead Location Detail 1 SEPTUM    Implantable Lead Location [id number]    Implantable Lead Connection Status [id number]    Lead Channel Setting Sensing Sensitivity 0.3 mV   Lead Channel Setting Pacing Pulse Width 0.4 ms   Lead Channel Setting Pacing Amplitude 2.0 V   Zone Setting Status Active    Zone Setting Status Inactive    Zone Setting Status Inactive    Zone Setting Status [phone]    Lead Channel Impedance Value 266 ohm   Lead Channel Impedance Value 342 ohm   Lead Channel Sensing Intrinsic Amplitude 3.5 mV   Lead Channel Pacing Threshold Amplitude 1.125 V   Lead Channel Pacing Threshold Pulse Width 0.4 ms   HighPow Impedance 85 ohm   Battery Remaining Longevity 156 mo   Battery Voltage 3.03 V   Brady Statistic RV Percent Paced 0.01 %      Assessment & Plan:   Problem List Items Addressed This Visit       Other   Gout - Primary   Complete course of Cholchicine.  Will also treat with Triamcinolone  in office today.  Follow up if not improved.       Relevant Medications   triamcinolone  acetonide (KENALOG -40) injection 40 mg (Start on 02/01/2024  2:00 PM)   Other Visit Diagnoses       Acute otitis externa of left ear, unspecified type       Will treat with Ciprodex.  Complete course of medication.  Follow up if not improved.        Follow up plan: Return in about 1 month (around 03/02/2024) for Physical and Fasting labs.

## 2024-02-01 NOTE — Assessment & Plan Note (Signed)
 Complete course of Cholchicine.  Will also treat with Triamcinolone  in office today.  Follow up if not improved.

## 2024-02-03 ENCOUNTER — Encounter

## 2024-02-08 ENCOUNTER — Encounter: Admitting: *Deleted

## 2024-02-08 DIAGNOSIS — I5022 Chronic systolic (congestive) heart failure: Secondary | ICD-10-CM | POA: Diagnosis not present

## 2024-02-08 NOTE — Progress Notes (Signed)
 Daily Session Note  Patient Details  Name: Marc [name] MRN: [id number] Date of Birth: 13-Apr-1976 Referring Provider:   Flowsheet Row Cardiac Rehab from 12/06/2023 in [facility]  Referring Provider Gardenia Led, DO    Encounter Date: 02/08/2024  Check In:  Session Check In - 02/08/24 0926       Check-In   Supervising physician immediately available to respond to emergencies See telemetry face sheet for immediately available ER MD    Location [facility]-Cardiac & Pulmonary [facility]    Staff Present Othel Durand, RN, BSN, CCRP;Noah Tickle, BS, Exercise Physiologist;Maxon Conetta BS, Exercise Physiologist;Jason Elnor RDN,LDN    Virtual Visit No    Medication changes reported     No    Fall or balance concerns reported    No    Warm-up and Cool-down Performed on first and last piece of equipment    Resistance Training Performed Yes    VAD Patient? No    PAD/SET Patient? No      Pain Assessment   Currently in Pain? No/denies             Social History   Tobacco Use  Smoking Status Every Day   Current packs/day: 0.50   Average packs/day: 0.5 packs/day for 31.5 years (15.7 ttl pk-yrs)   Types: Cigarettes   Start date: 1994  Smokeless Tobacco Never  Tobacco Comments   Under 1/2 pack a day. Was smoking a pack a day.    No quit date, weaning numbers of cigarettes    Goals Met:  Independence with exercise equipment Exercise tolerated well No report of concerns or symptoms today  Goals Unmet:  Not Applicable  Comments: Pt able to follow exercise prescription today without complaint.  Will continue to monitor for progression.    Dr. Oneil Pinal is Medical Director for [facility].  Dr. Fuad Aleskerov is Medical Director for [facility].

## 2024-02-10 ENCOUNTER — Encounter

## 2024-02-15 ENCOUNTER — Encounter: Attending: Cardiology

## 2024-02-15 DIAGNOSIS — I5022 Chronic systolic (congestive) heart failure: Secondary | ICD-10-CM | POA: Insufficient documentation

## 2024-02-16 DIAGNOSIS — I5022 Chronic systolic (congestive) heart failure: Secondary | ICD-10-CM

## 2024-02-16 NOTE — Progress Notes (Signed)
 Cardiac Individual Treatment Plan  Patient Details  Name: Marc [name] MRN: [id number] Date of Birth: Mar 25, 1976 Referring Provider:   Flowsheet Row Cardiac Rehab from 12/06/2023 in [facility] Cardiac and Pulmonary Rehab  Referring Provider Gardenia Led, DO    Initial Encounter Date:  Flowsheet Row Cardiac Rehab from 12/06/2023 in [facility] Cardiac and Pulmonary Rehab  Date 12/06/23    Visit Diagnosis: Chronic systolic heart failure (HCC)  Patient's Home Medications on Admission:  Current Outpatient Medications:    albuterol  (VENTOLIN  HFA) 108 (90 Base) MCG/ACT inhaler, Inhale 2 puffs into the lungs every 6 (six) hours as needed for wheezing or shortness of breath., Disp: 18 g, Rfl: 0   amiodarone  (PACERONE ) 200 MG tablet, Take 2 tablets (400 mg total) by mouth daily. PLEASE CALL OFFICE TO SCHEDULE APPOINTMENT PRIOR TO NEXT REFILL, Disp: 180 tablet, Rfl: 0   aspirin  EC 81 MG tablet, Take 81 mg by mouth daily., Disp: , Rfl:    colchicine  0.6 MG tablet, Take 1 tablet (0.6 mg total) by mouth 2 (two) times daily., Disp: 14 tablet, Rfl: 0   cyclobenzaprine  (FLEXERIL ) 10 MG tablet, TAKE 1 TABLET BY MOUTH EVERY 8 HOURS AS NEEDED FOR MUSCLE SPASM, Disp: 30 tablet, Rfl: 0   dapagliflozin  propanediol (FARXIGA ) 10 MG TABS tablet, Take 1 tablet (10 mg total) by mouth daily before breakfast., Disp: 90 tablet, Rfl: 3   digoxin  (LANOXIN ) 0.125 MG tablet, Take 1 tablet (0.125 mg total) by mouth daily., Disp: 90 tablet, Rfl: 3   docusate sodium  (COLACE) 100 MG capsule, Take 100 mg by mouth daily as needed for mild constipation., Disp: , Rfl:    hydrocortisone  (ANUSOL -HC) 2.5 % rectal cream, Place 1 Application rectally 2 (two) times daily. (Patient taking differently: Place 1 Application rectally 2 (two) times daily as needed for anal itching or hemorrhoids.), Disp: 30 g, Rfl: 0   metoprolol  succinate (TOPROL  XL) 50 MG 24 hr tablet, Take 1 tablet (50 mg total) by mouth at bedtime., Disp: 30 tablet, Rfl: 3    omeprazole (PRILOSEC) 20 MG capsule, Take 20 mg by mouth daily., Disp: , Rfl:    potassium chloride  SA (KLOR-CON  M) 20 MEQ tablet, Take 1 tablet (20 mEq total) by mouth daily., Disp: 30 tablet, Rfl: 3   rosuvastatin  (CRESTOR ) 5 MG tablet, Take 1 tablet (5 mg total) by mouth daily., Disp: 90 tablet, Rfl: 1   sacubitril -valsartan  (ENTRESTO ) 49-51 MG, Take 1 tablet by mouth 2 (two) times daily., Disp: 180 tablet, Rfl: 3   spironolactone  (ALDACTONE ) 25 MG tablet, Take 1 tablet (25 mg total) by mouth daily., Disp: 100 tablet, Rfl: 2   tirzepatide  (MOUNJARO ) 12.5 MG/0.5ML Pen, Inject 12.5 mg into the skin once a week., Disp: 2 mL, Rfl: 0   torsemide  (DEMADEX ) 20 MG tablet, TAKE 80MG  IN THE MORNING AND 40MG  IN THE EVENING, Disp: 180 tablet, Rfl: 3   triamcinolone  cream (KENALOG ) 0.1 %, Apply 1 Application topically 2 (two) times daily., Disp: 30 g, Rfl: 0   umeclidinium-vilanterol (ANORO ELLIPTA ) 62.5-25 MCG/ACT AEPB, Inhale 1 puff into the lungs daily., Disp: 1 each, Rfl: 1  Past Medical History: Past Medical History:  Diagnosis Date   CHF (congestive heart failure) (HCC)    Hypertension     Tobacco Use: Social History   Tobacco Use  Smoking Status Every Day   Current packs/day: 0.50   Average packs/day: 0.5 packs/day for 31.5 years (15.7 ttl pk-yrs)   Types: Cigarettes   Start date: 41  Smokeless Tobacco  Never  Tobacco Comments   Under 1/2 pack a day. Was smoking a pack a day.    No quit date, weaning numbers of cigarettes    Labs: Review Flowsheet  More data exists      Latest Ref Rng & Units 02/24/2022 03/16/2023 04/22/2023 07/01/2023 09/13/2023  Labs for ITP Cardiac and Pulmonary Rehab  Cholestrol 100 - 199 mg/dL - - - 782  -  LDL (calc) 0 - 99 mg/dL - - - 860  -  HDL-C >60 mg/dL - - - 49  -  Trlycerides 0 - 149 mg/dL - - - 837  -  Hemoglobin A1c 4.8 - 5.6 % 6.2  - 6.4  6.0  6.1   Bicarbonate 20.0 - 28.0 mmol/L 20.0 - 28.0 mmol/L - 27.9  27.5  - - -  TCO2 22 - 32 mmol/L 22 -  32 mmol/L - 29  29  - - -  O2 Saturation % % - 56  58  - - -    Details       Multiple values from one day are sorted in reverse-chronological order          Exercise Target Goals: Exercise Program Goal: Individual exercise prescription set using results from initial 6 min walk test and THRR while considering  patient's activity barriers and safety.   Exercise Prescription Goal: Initial exercise prescription builds to 30-45 minutes a day of aerobic activity, 2-3 days per week.  Home exercise guidelines will be given to patient during program as part of exercise prescription that the participant will acknowledge.   Education: Aerobic Exercise: - Group verbal and visual presentation on the components of exercise prescription. Introduces F.I.T.T principle from ACSM for exercise prescriptions.  Reviews F.I.T.T. principles of aerobic exercise including progression. Written material given at graduation. Flowsheet Row Cardiac Rehab from 01/13/2024 in [facility] Cardiac and Pulmonary Rehab  Date 01/13/24  Educator MB  Instruction Review Code 1- Verbalizes Understanding    Education: Resistance Exercise: - Group verbal and visual presentation on the components of exercise prescription. Introduces F.I.T.T principle from ACSM for exercise prescriptions  Reviews F.I.T.T. principles of resistance exercise including progression. Written material given at graduation.    Education: Exercise & Equipment Safety: - Individual verbal instruction and demonstration of equipment use and safety with use of the equipment. Flowsheet Row Cardiac Rehab from 01/13/2024 in [facility] Cardiac and Pulmonary Rehab  Date 12/06/23  Educator MB  Instruction Review Code 1- Verbalizes Understanding    Education: Exercise Physiology & General Exercise Guidelines: - Group verbal and written instruction with models to review the exercise physiology of the cardiovascular system and associated critical values. Provides general  exercise guidelines with specific guidelines to those with heart or lung disease.    Education: Flexibility, Balance, Mind/Body Relaxation: - Group verbal and visual presentation with interactive activity on the components of exercise prescription. Introduces F.I.T.T principle from ACSM for exercise prescriptions. Reviews F.I.T.T. principles of flexibility and balance exercise training including progression. Also discusses the mind body connection.  Reviews various relaxation techniques to help reduce and manage stress (i.e. Deep breathing, progressive muscle relaxation, and visualization). Balance handout provided to take home. Written material given at graduation.   Activity Barriers & Risk Stratification:  Activity Barriers & Cardiac Risk Stratification - 12/06/23 1123       Activity Barriers & Cardiac Risk Stratification   Activity Barriers Joint Problems;Arthritis   Knees (Prevent from standing long periods of time)   Cardiac Risk Stratification High  6 Minute Walk:  6 Minute Walk     Row Name 12/06/23 1120         6 Minute Walk   Phase Initial     Distance 705 feet     Walk Time 6 minutes     # of Rest Breaks 0     MPH 1.34     METS 1.57     RPE 13     Perceived Dyspnea  1     VO2 Peak 5.49     Symptoms Yes (comment)     Comments Bilateral hip and feet pain (5/10)     Resting HR 67 bpm     Resting BP 104/62     Resting Oxygen Saturation  96 %     Exercise Oxygen Saturation  during 6 min walk 99 %     Max Ex. HR 113 bpm     Max Ex. BP 130/70     2 Minute Post BP 100/68        Oxygen Initial Assessment:   Oxygen Re-Evaluation:   Oxygen Discharge (Final Oxygen Re-Evaluation):   Initial Exercise Prescription:  Initial Exercise Prescription - 12/06/23 1100       Date of Initial Exercise RX and Referring Provider   Date 12/06/23    Referring Provider Gardenia Led, DO      Oxygen   Maintain Oxygen Saturation 88% or higher      Treadmill    MPH 0.7    Grade 0    Minutes 15    METs 1.57      Recumbant Bike   Level 1    RPM 50    Watts 15    Minutes 15    METs 1.57      NuStep   Level 1    SPM 80    Minutes 15    METs 1.57      Arm Ergometer   Level 1    Watts 15    RPM 30    Minutes 15    METs 1.57      T5 Nustep   Level 1    SPM 80    Minutes 15    METs 1.57      Track   Laps 10    Minutes 15    METs 1.54      Prescription Details   Frequency (times per week) 2    Duration Progress to 30 minutes of continuous aerobic without signs/symptoms of physical distress      Intensity   THRR 40-80% of Max Heartrate 108-150    Ratings of Perceived Exertion 11-13    Perceived Dyspnea 0-4      Progression   Progression Continue to progress workloads to maintain intensity without signs/symptoms of physical distress.      Resistance Training   Training Prescription Yes    Weight 4, 7 lb   (4lb for L arm)   Reps 10-15          Perform Capillary Blood Glucose checks as needed.  Exercise Prescription Changes:   Exercise Prescription Changes     Row Name 12/06/23 1100 12/15/23 1500 12/31/23 1100 01/11/24 1300 01/27/24 0900     Response to Exercise   Blood Pressure (Admit) 104/62 118/66 114/66 100/60 124/80   Blood Pressure (Exercise) 130/70 126/64 138/66 124/66 138/70   Blood Pressure (Exit) 100/68 118/60 128/68 108/62 102/70   Heart Rate (Admit) 67 bpm 80 bpm 94 bpm 77  bpm 68 bpm   Heart Rate (Exercise) 113 bpm 109 bpm 129 bpm 102 bpm 103 bpm   Heart Rate (Exit) 72 bpm 69 bpm 76 bpm 88 bpm 76 bpm   Oxygen Saturation (Admit) 96 % -- -- -- --   Oxygen Saturation (Exercise) 99 % -- -- -- --   Oxygen Saturation (Exit) 97 % -- -- -- --   Rating of Perceived Exertion (Exercise) 13 13 14 12 15    Perceived Dyspnea (Exercise) 1 -- -- -- --   Symptoms Bilateral hip and feet pain (5/10) none hip pain none none   Comments results First two days of exercise -- -- --   Duration Progress to 30  minutes of  aerobic without signs/symptoms of physical distress Continue with 30 min of aerobic exercise without signs/symptoms of physical distress. Continue with 30 min of aerobic exercise without signs/symptoms of physical distress. Continue with 30 min of aerobic exercise without signs/symptoms of physical distress. Continue with 30 min of aerobic exercise without signs/symptoms of physical distress.   Intensity THRR New THRR unchanged THRR unchanged THRR unchanged THRR unchanged     Progression   Progression Continue to progress workloads to maintain intensity without signs/symptoms of physical distress. Continue to progress workloads to maintain intensity without signs/symptoms of physical distress. Continue to progress workloads to maintain intensity without signs/symptoms of physical distress. Continue to progress workloads to maintain intensity without signs/symptoms of physical distress. Continue to progress workloads to maintain intensity without signs/symptoms of physical distress.   Average METs 1.57 1.53 1.53 1.45 1.6     Resistance Training   Training Prescription -- Yes Yes Yes Yes   Weight -- 7 lb  (4lb for L arm) 5 lb  (4lb for L arm) 5 lb  (4lb for L arm) 5 lb  (4lb for L arm)   Reps -- 10-15 10-15 10-15 10-15     Interval Training   Interval Training -- No No No No     Treadmill   MPH -- -- 1 -- --   Grade -- -- 0 -- --   Minutes -- -- 15 -- --   METs -- -- 1.77 -- --     NuStep   Level -- 2 4 -- 5   Minutes -- 15 15 -- 15   METs -- 1.5 1.6 -- 2.4     Arm Ergometer   Level -- 1.2 4 2 5    Minutes -- 15 15 15 15    METs -- 1 1 1 1      T5 Nustep   Level -- 1 4 3 5    Minutes -- 30 15 15 15    METs -- 1.8 2 1.9 2     Oxygen   Maintain Oxygen Saturation -- 88% or higher 88% or higher 88% or higher 88% or higher    Row Name 02/10/24 0800             Response to Exercise   Blood Pressure (Admit) 122/64       Blood Pressure (Exercise) 138/68       Blood  Pressure (Exit) 110/60       Heart Rate (Admit) 98 bpm       Heart Rate (Exercise) 110 bpm       Heart Rate (Exit) 88 bpm       Rating of Perceived Exertion (Exercise) 15       Symptoms none       Duration Continue with 30  min of aerobic exercise without signs/symptoms of physical distress.       Intensity THRR unchanged         Progression   Progression Continue to progress workloads to maintain intensity without signs/symptoms of physical distress.       Average METs 1.7         Resistance Training   Training Prescription Yes       Weight 5 lb  (4lb for L arm)       Reps 10-15         Interval Training   Interval Training No         NuStep   Level 6       Minutes 15       METs 2.4         Arm Ergometer   Level 5       Minutes 15       METs 1         Oxygen   Maintain Oxygen Saturation 88% or higher          Exercise Comments:   Exercise Comments     Row Name 12/07/23 1005           Exercise Comments First full day of exercise!  Patient was oriented to gym and equipment including functions, settings, policies, and procedures.  Patient's individual exercise prescription and treatment plan were reviewed.  All starting workloads were established based on the results of the 6 minute walk test done at initial orientation visit.  The plan for exercise progression was also introduced and progression will be customized based on patient's performance and goals.          Exercise Goals and Review:   Exercise Goals     Row Name 12/06/23 1128             Exercise Goals   Increase Physical Activity Yes       Intervention Provide advice, education, support and counseling about physical activity/exercise needs.;Develop an individualized exercise prescription for aerobic and resistive training based on initial evaluation findings, risk stratification, comorbidities and participant's personal goals.       Expected Outcomes Short Term: Attend rehab on a regular basis to  increase amount of physical activity.;Long Term: Add in home exercise to make exercise part of routine and to increase amount of physical activity.;Long Term: Exercising regularly at least 3-5 days a week.       Increase Strength and Stamina Yes       Intervention Provide advice, education, support and counseling about physical activity/exercise needs.;Develop an individualized exercise prescription for aerobic and resistive training based on initial evaluation findings, risk stratification, comorbidities and participant's personal goals.       Expected Outcomes Short Term: Increase workloads from initial exercise prescription for resistance, speed, and METs.;Short Term: Perform resistance training exercises routinely during rehab and add in resistance training at home;Long Term: Improve cardiorespiratory fitness, muscular endurance and strength as measured by increased METs and functional capacity ( )       Able to understand and use rate of perceived exertion (RPE) scale Yes       Intervention Provide education and explanation on how to use RPE scale       Expected Outcomes Short Term: Able to use RPE daily in rehab to express subjective intensity level;Long Term:  Able to use RPE to guide intensity level when exercising independently       Able to understand and use  Dyspnea scale Yes       Intervention Provide education and explanation on how to use Dyspnea scale       Expected Outcomes Short Term: Able to use Dyspnea scale daily in rehab to express subjective sense of shortness of breath during exertion;Long Term: Able to use Dyspnea scale to guide intensity level when exercising independently       Knowledge and understanding of Target Heart Rate Range (THRR) Yes       Intervention Provide education and explanation of THRR including how the numbers were predicted and where they are located for reference       Expected Outcomes Short Term: Able to state/look up THRR;Short Term: Able to use daily  as guideline for intensity in rehab;Long Term: Able to use THRR to govern intensity when exercising independently       Able to check pulse independently Yes       Intervention Provide education and demonstration on how to check pulse in carotid and radial arteries.;Review the importance of being able to check your own pulse for safety during independent exercise       Expected Outcomes Short Term: Able to explain why pulse checking is important during independent exercise;Long Term: Able to check pulse independently and accurately       Understanding of Exercise Prescription Yes       Intervention Provide education, explanation, and written materials on patient's individual exercise prescription       Expected Outcomes Short Term: Able to explain program exercise prescription;Long Term: Able to explain home exercise prescription to exercise independently          Exercise Goals Re-Evaluation :  Exercise Goals Re-Evaluation     Row Name 12/07/23 1005 12/15/23 1530 12/31/23 1127 01/11/24 1342 01/27/24 0958     Exercise Goal Re-Evaluation   Exercise Goals Review Understanding of Exercise Prescription;Knowledge and understanding of Target Heart Rate Range (THRR);Able to understand and use rate of perceived exertion (RPE) scale;Able to understand and use Dyspnea scale Increase Physical Activity;Increase Strength and Stamina;Understanding of Exercise Prescription Increase Physical Activity;Increase Strength and Stamina;Understanding of Exercise Prescription Increase Physical Activity;Increase Strength and Stamina;Understanding of Exercise Prescription Increase Physical Activity;Increase Strength and Stamina;Understanding of Exercise Prescription   Comments Reviewed RPE and dyspnea scale, THR and program prescription with pt today.  Pt voiced understanding and was given a copy of goals to take home. Marc [name] is off to a good start in rehab. He did well on the T5 nustep machine at level 1 during his first  session. He also did well on the T4 nustep at level 2 and the arm crank at level 1.2 during his second session. We will continue to monitor his progress in the program. Marc [name] is doing well in rehab. He did well on the treadmill at a speed of 1 mph with no incline, while dealing with hip pain. He also improved to level 4 on the T5 nustep, T4 nustep, and arm crank. We will continue to monitor his progress in the program. Marc [name] only attended one session of rehab since the last review. He was able to continue to work at level 3 on the T5 nustep and level 2 on the arm ergometer. He also continues to do well with 5 lb hand weights for resistance training. We will continue to monitor his progress in the program. Marc [name] is doing well in rehab and attended 2 sessions of rehab in this review. He was able to increase to level 5 on both  the T4 and T5 nusteps. He also was able to increase to level 5 on the arm ergometer. We will continue to monitor his progress in the program.   Expected Outcomes Short: Use RPE daily to regulate intensity. Long: Follow program prescription in THR. Short: Continue to follow current exercise prescription. Long: Continue exercise to improve strength and stamina. Short: Continue to walk on the treadmill as tolerated by hip pain. Long: Continue exercise to improve strength and stamina. Short: Attend rehab more consistently. Long: Continue exercise to improve strength and stamina. Short: Attend rehab more consistently and progressively increase workloads on the nustep and arm ergometer. Long: Continue exercise to improve strength and stamina.    Row Name 02/10/24 0807             Exercise Goal Re-Evaluation   Exercise Goals Review Increase Physical Activity;Increase Strength and Stamina;Understanding of Exercise Prescription       Comments Marc [name] has only attended one session since the last review. During his one session he improved to level 6 on the T4 nustep. He also continues to work  at level 5 on the arm crank. We will continue to monitor his progress in the program.       Expected Outcomes Short: Attend rehab more consistently. Long: Continue exercise to improve strength and stamina.          Discharge Exercise Prescription (Final Exercise Prescription Changes):  Exercise Prescription Changes - 02/10/24 0800       Response to Exercise   Blood Pressure (Admit) 122/64    Blood Pressure (Exercise) 138/68    Blood Pressure (Exit) 110/60    Heart Rate (Admit) 98 bpm    Heart Rate (Exercise) 110 bpm    Heart Rate (Exit) 88 bpm    Rating of Perceived Exertion (Exercise) 15    Symptoms none    Duration Continue with 30 min of aerobic exercise without signs/symptoms of physical distress.    Intensity THRR unchanged      Progression   Progression Continue to progress workloads to maintain intensity without signs/symptoms of physical distress.    Average METs 1.7      Resistance Training   Training Prescription Yes    Weight 5 lb   (4lb for L arm)   Reps 10-15      Interval Training   Interval Training No      NuStep   Level 6    Minutes 15    METs 2.4      Arm Ergometer   Level 5    Minutes 15    METs 1      Oxygen   Maintain Oxygen Saturation 88% or higher          Nutrition:  Target Goals: Understanding of nutrition guidelines, daily intake of sodium 1500mg , cholesterol 200mg , calories 30% from fat and 7% or less from saturated fats, daily to have 5 or more servings of fruits and vegetables.  Education: All About Nutrition: -Group instruction provided by verbal, written material, interactive activities, discussions, models, and posters to present general guidelines for heart healthy nutrition including fat, fiber, MyPlate, the role of sodium in heart healthy nutrition, utilization of the nutrition label, and utilization of this knowledge for meal planning. Follow up email sent as well. Written material given at graduation.   Biometrics:  Pre  Biometrics - 12/06/23 1128       Pre Biometrics   Height 5' 8.5 (1.74 m)    Weight 357 lb 8  oz (162.2 kg)    Waist Circumference 61 inches    Hip Circumference 63 inches    Waist to Hip Ratio 0.97 %    BMI (Calculated) 53.56    Single Leg Stand 6 seconds           Nutrition Therapy Plan and Nutrition Goals:  Nutrition Therapy & Goals - 12/06/23 1348       Nutrition Therapy   Diet Cardiac, Low Na    Protein (specify units) 90    Fiber 30 grams    Whole Grain Foods 3 servings    Saturated Fats 15 max. grams    Fruits and Vegetables 5 servings/day    Sodium 1.5 grams      Personal Nutrition Goals   Nutrition Goal Read labels and reduce sodium intake to below 2300mg . Ideally 1500mg  per day.    Personal Goal #2 Eat 15-30gProtein and 30-60gCarbs at each meal.    Personal Goal #3 Include more colorful produce, aim for 5-8 servings of fruits and veggies per day    Comments Patient is currently drinking some soda, but has been working on cutting back on juice. Discussed sugary beverages and how they affect his blood sugar and weight. Reccommended he switch to water or sugar free alternatives. He reports he usually only eats one meal. Will snack on sweets or junk foods before bed. He reports its a habit and enjoys those foods. Says he doesn't eat in the mornings because he is on a lot of meds and often doesn't feel like eating. His wife has been trying to get him to eat in the mornings. He is willing to work on this, but would like some help. Spoke with him about the goals of morning meals and how to make the meals smaller and easy to pull off even when he doesn't feel like eating much. Brainstormed several breakfast ideas that have protein, complex carbs and keep sodium controlled. Provided him with a guideline of less than 1500mg  sodium per day. He says he doesn't use salt at the table. Reminded him to read labels and make sure the foods he is eating are not already very salty. Provided  mediterranean diet handout. Educated on types of fats, sources, and how to read labels. Went over balanced plates handout and encouraged more veggies and low calorie dense meals with larger meals.      Intervention Plan   Intervention Prescribe, educate and counsel regarding individualized specific dietary modifications aiming towards targeted core components such as weight, hypertension, lipid management, diabetes, heart failure and other comorbidities.;Nutrition handout(s) given to patient.    Expected Outcomes Short Term Goal: Understand basic principles of dietary content, such as calories, fat, sodium, cholesterol and nutrients.;Short Term Goal: A plan has been developed with personal nutrition goals set during dietitian appointment.;Long Term Goal: Adherence to prescribed nutrition plan.          Nutrition Assessments:  MEDIFICTS Score Key: >=70 Need to make dietary changes  40-70 Heart Healthy Diet <= 40 Therapeutic Level Cholesterol Diet   Picture Your Plate Scores: <59 Unhealthy dietary pattern with much room for improvement. 41-50 Dietary pattern unlikely to meet recommendations for good health and room for improvement. 51-60 More healthful dietary pattern, with some room for improvement.  >60 Healthy dietary pattern, although there may be some specific behaviors that could be improved.    Nutrition Goals Re-Evaluation:  Nutrition Goals Re-Evaluation     Row Name 01/11/24 [id number]  Goals   Comment Marc [name]'s Mounjaro  dose was increased and he struggles to eat much. Currently eating one meal per day, says some days he doesn't want to eat that meal. Reminded him of discussion had with RD about eating smaller more frequent meals or snacks, even when not hungry because Moujaro is very effective at reducing appetite.       Expected Outcome STG: Eat small frequent meals/snacks. LTG: Follow a heart healthy diet that supports body's needs          Nutrition Goals  Discharge (Final Nutrition Goals Re-Evaluation):  Nutrition Goals Re-Evaluation - 01/11/24 0947       Goals   Comment Marc [name]'s Mounjaro  dose was increased and he struggles to eat much. Currently eating one meal per day, says some days he doesn't want to eat that meal. Reminded him of discussion had with RD about eating smaller more frequent meals or snacks, even when not hungry because Moujaro is very effective at reducing appetite.    Expected Outcome STG: Eat small frequent meals/snacks. LTG: Follow a heart healthy diet that supports body's needs          Psychosocial: Target Goals: Acknowledge presence or absence of significant depression and/or stress, maximize coping skills, provide positive support system. Participant is able to verbalize types and ability to use techniques and skills needed for reducing stress and depression.   Education: Stress, Anxiety, and Depression - Group verbal and visual presentation to define topics covered.  Reviews how body is impacted by stress, anxiety, and depression.  Also discusses healthy ways to reduce stress and to treat/manage anxiety and depression.  Written material given at graduation.   Education: Sleep Hygiene -Provides group verbal and written instruction about how sleep can affect your health.  Define sleep hygiene, discuss sleep cycles and impact of sleep habits. Review good sleep hygiene tips.    Initial Review & Psychosocial Screening:  Initial Psych Review & Screening - 11/25/23 1342       Initial Review   Current issues with None Identified      Family Dynamics   Good Support System? Yes   wife and sister     Barriers   Psychosocial barriers to participate in program There are no identifiable barriers or psychosocial needs.      Screening Interventions   Interventions To provide support and resources with identified psychosocial needs;Provide feedback about the scores to participant    Expected Outcomes Short Term goal:  Utilizing psychosocial counselor, staff and physician to assist with identification of specific Stressors or current issues interfering with healing process. Setting desired goal for each stressor or current issue identified.;Long Term Goal: Stressors or current issues are controlled or eliminated.;Short Term goal: Identification and review with participant of any Quality of Life or Depression concerns found by scoring the questionnaire.;Long Term goal: The participant improves quality of Life and PHQ9 Scores as seen by post scores and/or verbalization of changes          Quality of Life Scores:   Scores of 19 and below usually indicate a poorer quality of life in these areas.  A difference of  2-3 points is a clinically meaningful difference.  A difference of 2-3 points in the total score of the Quality of Life Index has been associated with significant improvement in overall quality of life, self-image, physical symptoms, and general health in studies assessing change in quality of life.  PHQ-9: Review Flowsheet  More data exists  02/01/2024 01/11/2024 12/06/2023 09/16/2023 05/31/2023  Depression screen PHQ 2/9  Decreased Interest 0 1 1 1 1   Down, Depressed, Hopeless 0 0 0 1 0  PHQ - 2 Score 0 1 1 2 1   Altered sleeping 0 2 2 1  0  Tired, decreased energy 0 2 2 1 2   Change in appetite 0 3 3 0 0  Feeling bad or failure about yourself  0 1 1 0 0  Trouble concentrating 0 1 1 0 0  Moving slowly or fidgety/restless 0 0 0 0 2  Suicidal thoughts 0 0 0 0 0  PHQ-9 Score 0 10 10 4 5   Difficult doing work/chores - Somewhat difficult Somewhat difficult Somewhat difficult Somewhat difficult   Interpretation of Total Score  Total Score Depression Severity:  1-4 = Minimal depression, 5-9 = Mild depression, 10-14 = Moderate depression, 15-19 = Moderately severe depression, 20-27 = Severe depression   Psychosocial Evaluation and Intervention:  Psychosocial Evaluation - 11/25/23 1410        Psychosocial Evaluation & Interventions   Interventions Encouraged to exercise with the program and follow exercise prescription    Comments THere are no barriers to attending the program.  He lives with his wife, adult daughter(austistic/non verbal) and mother in law.  He is ready to start the program.  He is working on weight loss.  He states he has no concerns.   He is working on tobacco cessation and doe not have a quit date ,;he has decreased from a pack to 1/2 a pack aday. He is ready to start the program.    Expected Outcomes STG attend all sessions, conntinue working on tobacco cessation and weight loss. Work on exericse progression.  LTG Able to maintain/continue tobacco cessation, weight loss and exercise progression    Continue Psychosocial Services  Follow up required by staff          Psychosocial Re-Evaluation:  Psychosocial Re-Evaluation     Row Name 01/11/24 [phone]             Psychosocial Re-Evaluation   Current issues with Current Sleep Concerns;Current Stress Concerns;Current Anxiety/Panic       Comments Completed PHQ-9, scored 10. His appetite has gotten worse with the dose increase in his Mounjaro . He has anxiety and depression ongoing, his medical team is aware and working wit him. Is not sleeping well most nights.       Expected Outcomes STG: Take meds as prescribed, use support systems as needed and focus on good sleep hygiene. LTG: Achieve and maintain a positive outlook on health and daily life       Interventions Encouraged to attend Cardiac Rehabilitation for the exercise       Continue Psychosocial Services  Follow up required by staff          Psychosocial Discharge (Final Psychosocial Re-Evaluation):  Psychosocial Re-Evaluation - 01/11/24 0942       Psychosocial Re-Evaluation   Current issues with Current Sleep Concerns;Current Stress Concerns;Current Anxiety/Panic    Comments Completed PHQ-9, scored 10. His appetite has gotten worse with the dose  increase in his Mounjaro . He has anxiety and depression ongoing, his medical team is aware and working wit him. Is not sleeping well most nights.    Expected Outcomes STG: Take meds as prescribed, use support systems as needed and focus on good sleep hygiene. LTG: Achieve and maintain a positive outlook on health and daily life    Interventions Encouraged to attend Cardiac Rehabilitation for the exercise  Continue Psychosocial Services  Follow up required by staff          Vocational Rehabilitation: Provide vocational rehab assistance to qualifying candidates.   Vocational Rehab Evaluation & Intervention:  Vocational Rehab - 11/25/23 1349       Initial Vocational Rehab Evaluation & Intervention   Assessment shows need for Vocational Rehabilitation No      Vocational Rehab Re-Evaulation   Comments no requests at this time.          Education: Education Goals: Education classes will be provided on a variety of topics geared toward better understanding of heart health and risk factor modification. Participant will state understanding/return demonstration of topics presented as noted by education test scores.  Learning Barriers/Preferences:   General Cardiac Education Topics:  AED/CPR: - Group verbal and written instruction with the use of models to demonstrate the basic use of the AED with the basic ABC's of resuscitation.   Anatomy and Cardiac Procedures: - Group verbal and visual presentation and models provide information about basic cardiac anatomy and function. Reviews the testing methods done to diagnose heart disease and the outcomes of the test results. Describes the treatment choices: Medical Management, Angioplasty, or Coronary Bypass Surgery for treating various heart conditions including Myocardial Infarction, Angina, Valve Disease, and Cardiac Arrhythmias.  Written material given at graduation.   Medication Safety: - Group verbal and visual instruction to review  commonly prescribed medications for heart and lung disease. Reviews the medication, class of the drug, and side effects. Includes the steps to properly store meds and maintain the prescription regimen.  Written material given at graduation.   Intimacy: - Group verbal instruction through game format to discuss how heart and lung disease can affect sexual intimacy. Written material given at graduation.. Flowsheet Row Cardiac Rehab from 01/13/2024 in [facility] Cardiac and Pulmonary Rehab  Date 01/13/24  Educator MB  Instruction Review Code 1- Verbalizes Understanding    Know Your Numbers and Heart Failure: - Group verbal and visual instruction to discuss disease risk factors for cardiac and pulmonary disease and treatment options.  Reviews associated critical values for Overweight/Obesity, Hypertension, Cholesterol, and Diabetes.  Discusses basics of heart failure: signs/symptoms and treatments.  Introduces Heart Failure Zone chart for action plan for heart failure.  Written material given at graduation. Flowsheet Row Cardiac Rehab from 01/13/2024 in [facility] Cardiac and Pulmonary Rehab  Date 12/09/23  Educator SB  Instruction Review Code 1- Verbalizes Understanding    Infection Prevention: - Provides verbal and written material to individual with discussion of infection control including proper hand washing and proper equipment cleaning during exercise session. Flowsheet Row Cardiac Rehab from 01/13/2024 in [facility] Cardiac and Pulmonary Rehab  Date 12/06/23  Educator MB  Instruction Review Code 1- Verbalizes Understanding    Falls Prevention: - Provides verbal and written material to individual with discussion of falls prevention and safety. Flowsheet Row Cardiac Rehab from 01/13/2024 in [facility] Cardiac and Pulmonary Rehab  Date 12/06/23  Educator MB  Instruction Review Code 1- Verbalizes Understanding    Other: -Provides group and verbal instruction on various topics (see comments)   Knowledge  Questionnaire Score:   Core Components/Risk Factors/Patient Goals at Admission:  Personal Goals and Risk Factors at Admission - 12/06/23 1129       Core Components/Risk Factors/Patient Goals on Admission    Weight Management Yes;Obesity;Weight Loss    Intervention Weight Management: Develop a combined nutrition and exercise program designed to reach desired caloric intake, while maintaining appropriate intake  of nutrient and fiber, sodium and fats, and appropriate energy expenditure required for the weight goal.;Obesity: Provide education and appropriate resources to help participant work on and attain dietary goals.;Weight Management/Obesity: Establish reasonable short term and long term weight goals.;Weight Management: Provide education and appropriate resources to help participant work on and attain dietary goals.    Admit Weight 357 lb 8 oz (162.2 kg)    Goal Weight: Short Term 350 lb (158.8 kg)    Goal Weight: Long Term 240 lb (108.9 kg)    Expected Outcomes Short Term: Continue to assess and modify interventions until short term weight is achieved;Long Term: Adherence to nutrition and physical activity/exercise program aimed toward attainment of established weight goal;Weight Loss: Understanding of general recommendations for a balanced deficit meal plan, which promotes 1-2 lb weight loss per week and includes a negative energy balance of [phone] kcal/d;Understanding recommendations for meals to include 15-35% energy as protein, 25-35% energy from fat, 35-60% energy from carbohydrates, less than 200mg  of dietary cholesterol, 20-35 gm of total fiber daily;Understanding of distribution of calorie intake throughout the day with the consumption of 4-5 meals/snacks    Tobacco Cessation Yes    Number of packs per day 1/4 a pack and continuing to wean down to 0    Intervention Assist the participant in steps to quit. Provide individualized education and counseling about committing to Tobacco  Cessation, relapse prevention, and pharmacological support that can be provided by physician.;Education officer, environmental, assist with locating and accessing local/national Quit Smoking programs, and support quit date choice.    Expected Outcomes Short Term: Will demonstrate readiness to quit, by selecting a quit date.;Short Term: Will quit all tobacco product use, adhering to prevention of relapse plan.;Long Term: Complete abstinence from all tobacco products for at least 12 months from quit date.    Heart Failure Yes    Intervention Provide a combined exercise and nutrition program that is supplemented with education, support and counseling about heart failure. Directed toward relieving symptoms such as shortness of breath, decreased exercise tolerance, and extremity edema.    Expected Outcomes Improve functional capacity of life;Short term: Attendance in program 2-3 days a week with increased exercise capacity. Reported lower sodium intake. Reported increased fruit and vegetable intake. Reports medication compliance.;Short term: Daily weights obtained and reported for increase. Utilizing diuretic protocols set by physician.;Long term: Adoption of self-care skills and reduction of barriers for early signs and symptoms recognition and intervention leading to self-care maintenance.    Hypertension Yes    Intervention Provide education on lifestyle modifcations including regular physical activity/exercise, weight management, moderate sodium restriction and increased consumption of fresh fruit, vegetables, and low fat dairy, alcohol moderation, and smoking cessation.;Monitor prescription use compliance.    Expected Outcomes Short Term: Continued assessment and intervention until BP is < 140/71mm HG in hypertensive participants. < 130/63mm HG in hypertensive participants with diabetes, heart failure or chronic kidney disease.;Long Term: Maintenance of blood pressure at goal levels.    Lipids Yes     Intervention Provide education and support for participant on nutrition & aerobic/resistive exercise along with prescribed medications to achieve LDL 70mg , HDL >40mg .    Expected Outcomes Short Term: Participant states understanding of desired cholesterol values and is compliant with medications prescribed. Participant is following exercise prescription and nutrition guidelines.;Long Term: Cholesterol controlled with medications as prescribed, with individualized exercise RX and with personalized nutrition plan. Value goals: LDL < 70mg , HDL > 40 mg.  Education:Diabetes - Individual verbal and written instruction to review signs/symptoms of diabetes, desired ranges of glucose level fasting, after meals and with exercise. Acknowledge that pre and post exercise glucose checks will be done for 3 sessions at entry of program.   Core Components/Risk Factors/Patient Goals Review:   Goals and Risk Factor Review     Row Name 01/11/24 0949             Core Components/Risk Factors/Patient Goals Review   Personal Goals Review Hypertension       Review Marc [name] does not check his blood pressure at home, but he wants to get a blood pressure cuff. Encuraged him to get one and bring into rehab to check its accuracy and walk through how to use it if he needs help.       Expected Outcomes STG: Buy a BP cuff and check BP at home. LTG: manage risk factors independently          Core Components/Risk Factors/Patient Goals at Discharge (Final Review):   Goals and Risk Factor Review - 01/11/24 0949       Core Components/Risk Factors/Patient Goals Review   Personal Goals Review Hypertension    Review Marc [name] does not check his blood pressure at home, but he wants to get a blood pressure cuff. Encuraged him to get one and bring into rehab to check its accuracy and walk through how to use it if he needs help.    Expected Outcomes STG: Buy a BP cuff and check BP at home. LTG: manage risk factors  independently          ITP Comments:  ITP Comments     Row Name 11/25/23 1409 12/06/23 1117 12/07/23 1005 12/22/23 0825 01/19/24 0913   ITP Comments Virtual orientation call completed today. he has an appointment on Date: 12/06/2023   for EP eval and gym Orientation.  Documentation of diagnosis can be found in [facility] 11/09/2023 .  Marc [name] is a current tobacco user. Intervention for tobacco cessation was provided at the initial medical review. He was asked about readiness to quit and reported he is weaning down from a pack a day and is at 1/2 a pack a day at this time.  No quit date yet.  . Patient was advised and educated about tobacco cessation using combination therapy, tobacco cessation classes, quit line, and quit smoking apps. Patient demonstrated understanding of this material. Staff will continue to provide encouragement and follow up with the patient throughout the program. Completed and gym orientation for cardiac rehab. Initial ITP created and sent for review to Dr. Oneil Pinal, Medical Director. Marc [name] is a current tobacco user. Intervention for tobacco cessation was provided at the initial medical review. He was asked about readiness to quit and reported he is decreasing down his number from 1/2 a pack and is down to 5 cigarettes a day. Patient was advised and educated about tobacco cessation using combination therapy, tobacco cessation classes, quit line, and quit smoking apps. Patient demonstrated understanding of this material. Staff will continue to provide encouragement and follow up with the patient throughout the program. First full day of exercise!  Patient was oriented to gym and equipment including functions, settings, policies, and procedures.  Patient's individual exercise prescription and treatment plan were reviewed.  All starting workloads were established based on the results of the 6 minute walk test done at initial orientation visit.  The plan for exercise progression was  also introduced and progression will be customized  based on patient's performance and goals. 30 Day review completed. Medical Director ITP review done, changes made as directed, and signed approval by Medical Director.    new to program 30 Day review completed. Medical Director ITP review done, changes made as directed, and signed approval by Medical Director.    Row Name 02/16/24 0752           ITP Comments 30 Day review completed. Medical Director ITP review done, changes made as directed, and signed approval by Medical Director.          Comments: 30 day review

## 2024-02-16 NOTE — Progress Notes (Signed)
 30 Day review completed. Medical Director ITP review done, changes made as directed, and signed approval by Medical Director. ? ?

## 2024-02-17 ENCOUNTER — Encounter

## 2024-02-22 ENCOUNTER — Encounter

## 2024-02-24 ENCOUNTER — Encounter

## 2024-02-28 ENCOUNTER — Ambulatory Visit: Admitting: Nurse Practitioner

## 2024-02-28 ENCOUNTER — Encounter: Payer: Self-pay | Admitting: Nurse Practitioner

## 2024-02-28 ENCOUNTER — Other Ambulatory Visit: Payer: Self-pay | Admitting: Cardiology

## 2024-02-28 VITALS — BP 125/75 | HR 80 | Temp 98.2°F | Ht 68.0 in | Wt 337.8 lb

## 2024-02-28 DIAGNOSIS — I700 Atherosclerosis of aorta: Secondary | ICD-10-CM | POA: Diagnosis not present

## 2024-02-28 DIAGNOSIS — Z0000 Encounter for general adult medical examination without abnormal findings: Secondary | ICD-10-CM | POA: Diagnosis not present

## 2024-02-28 DIAGNOSIS — I10 Essential (primary) hypertension: Secondary | ICD-10-CM

## 2024-02-28 DIAGNOSIS — I428 Other cardiomyopathies: Secondary | ICD-10-CM

## 2024-02-28 DIAGNOSIS — G4733 Obstructive sleep apnea (adult) (pediatric): Secondary | ICD-10-CM

## 2024-02-28 DIAGNOSIS — M109 Gout, unspecified: Secondary | ICD-10-CM

## 2024-02-28 DIAGNOSIS — R7303 Prediabetes: Secondary | ICD-10-CM

## 2024-02-28 MED ORDER — DAPAGLIFLOZIN PROPANEDIOL 10 MG PO TABS: 10.0000 mg | ORAL_TABLET | Freq: Every day | ORAL | 1 refills | Status: DC

## 2024-02-28 MED ORDER — TRIAMCINOLONE ACETONIDE 40 MG/ML IJ SUSP
40.0000 mg | Freq: Once | INTRAMUSCULAR | Status: AC
  Administered 2024-02-28: 40 mg via INTRAMUSCULAR

## 2024-02-28 MED ORDER — COLCHICINE 0.6 MG PO TABS: 0.6000 mg | ORAL_TABLET | Freq: Two times a day (BID) | ORAL | 0 refills | Status: AC

## 2024-02-28 NOTE — Assessment & Plan Note (Signed)
 Chronic.  On Mounjaro  and working on weight loss.  Encourage patient to continue with weight loss efforts.

## 2024-02-28 NOTE — Assessment & Plan Note (Signed)
 Doesn't want to see feeling great.  Referral place referral for Pulmonology to manage his sleep apnea.

## 2024-02-28 NOTE — Assessment & Plan Note (Signed)
Chronic.  Controlled.  Continue with current medication regimen of ASA. Labs ordered today.  Return to clinic in 6 months for reevaluation.  Call sooner if concerns arise.   

## 2024-02-28 NOTE — Assessment & Plan Note (Signed)
 Chronic.  Controlled.  Continue with current medication regimen of Mounjaro .  Labs ordered today.  Return to clinic in 6 months for reevaluation.  Call sooner if concerns arise.

## 2024-02-28 NOTE — Assessment & Plan Note (Addendum)
 Chronic.  Stable.  Followed by Cardiology.  Currently doing Cardiac rehab.  Not weighing daily. Reviewed recent Cardiology note.  - Reminded to call for an overnight weight gain of >2 pounds or a weekly weight gain of >5 pounds - not adding salt to food and read food labels. Reviewed the importance of keeping daily sodium intake to 2000mg  daily. - Avoid Ibuprofen products.

## 2024-02-28 NOTE — Assessment & Plan Note (Signed)
Chronic.  Controlled.  Continue with current medication regimen of Entresto, Carvdilol and Spirnolactone.  Followed by HF clinic. Labs ordered today.  Return to clinic in 6 months for reevaluation.  Call sooner if concerns arise.

## 2024-02-28 NOTE — Assessment & Plan Note (Signed)
 Kenalog  given in office.  Will give another round of Cholchicine. Follow up if not improved.

## 2024-02-28 NOTE — Progress Notes (Signed)
 BP 125/75   Pulse 80   Temp 98.2 F (36.8 C) (Oral)   Ht 5' 8 (1.727 m)   Wt (!) 337 lb 12.8 oz (153.2 kg)   SpO2 98%   BMI 51.36 kg/m    Subjective:    Patient ID: Marc [name], male    DOB: 08/11/1976, 48 y.o.   MRN: [id number]  HPI: Marc [name] is a 48 y.o. male presenting on 02/28/2024 for comprehensive medical examination. Current medical complaints include:left foot pain  He currently lives with: Interim Problems from his last visit: no  HYPERTENSION/HEART FAILURE Not weighing himself daily.  He is on Mounjaro .  Lost 8lbs since June 17.  Currently in Cardiac rehab. He is sleeping with 3 pillows at night.   No swelling in lower extremities. On Farxiga  and Entresto . Hypertension status: controlled  Satisfied with current treatment? no Duration of hypertension: years BP monitoring frequency:  not checking BP range:  BP medication side effects:  no Medication compliance: excellent compliance Previous BP meds:and Entresto , carvedilol , and spironalactone Aspirin : yes Recurrent headaches: no Visual changes: no Palpitations: no Dyspnea: yes Chest pain: no Lower extremity edema: no Dizzy/lightheaded: no   LEFT FOOT PAIN Patient states his left foot started hurting the last 4-5 days ago.  States it got better after he completed the course of colchicine .   He is having trouble walking on it.  States the pain is in the ankle and the back of his heel.  No injury.    Depression Screen done today and results listed below:     02/28/2024    2:09 PM 02/01/2024    1:37 PM 01/11/2024    9:42 AM 12/06/2023   11:32 AM 09/16/2023    1:17 PM  Depression screen PHQ 2/9  Decreased Interest 0 0 1 1 1   Down, Depressed, Hopeless 0 0 0 0 1  PHQ - 2 Score 0 0 1 1 2   Altered sleeping 3 0 2 2 1   Tired, decreased energy 2 0 2 2 1   Change in appetite 2 0 3 3 0  Feeling bad or failure about yourself  0 0 1 1 0  Trouble concentrating 0 0 1 1 0  Moving slowly or  fidgety/restless 0 0 0 0 0  Suicidal thoughts 0 0 0 0 0  PHQ-9 Score 7 0 10 10 4   Difficult doing work/chores Not difficult at all  Somewhat difficult Somewhat difficult Somewhat difficult    The patient does not have a history of falls. I did complete a risk assessment for falls. A plan of care for falls was documented.   Past Medical History:  Past Medical History:  Diagnosis Date   CHF (congestive heart failure) (HCC)    Hypertension     Surgical History:  Past Surgical History:  Procedure Laterality Date   BICEPS TENDON REPAIR     COLONOSCOPY WITH PROPOFOL  N/A 10/09/2020   Procedure: COLONOSCOPY WITH PROPOFOL ;  Surgeon: Unk Corinn Skiff, MD;  Location: [facility] ENDOSCOPY;  Service: Gastroenterology;  Laterality: N/A;   FLEXIBLE SIGMOIDOSCOPY N/A 10/30/2020   Procedure: FLEXIBLE SIGMOIDOSCOPY;  Surgeon: Unk Corinn Skiff, MD;  Location: [facility] ENDOSCOPY;  Service: Gastroenterology;  Laterality: N/A;   FOOT SURGERY     metataral and fasciotomy   ICD IMPLANT N/A 12/16/2022   Procedure: ICD IMPLANT;  Surgeon: Cindie Ole DASEN, MD;  Location: [facility] INVASIVE CV LAB;  Service: Cardiovascular;  Laterality: N/A;   RIGHT HEART CATH N/A 03/16/2023   Procedure:  RIGHT HEART CATH;  Surgeon: Gardenia Led, DO;  Location: [facility] INVASIVE CV LAB;  Service: Cardiovascular;  Laterality: N/A;   RIGHT/LEFT HEART CATH AND CORONARY ANGIOGRAPHY Bilateral 09/19/2020   Procedure: RIGHT/LEFT HEART CATH AND CORONARY ANGIOGRAPHY;  Surgeon: Florencio Cara BIRCH, MD;  Location: [facility] INVASIVE CV LAB;  Service: Cardiovascular;  Laterality: Bilateral;   ROTATOR CUFF REPAIR      Medications:  Current Outpatient Medications on File Prior to Visit  Medication Sig   albuterol  (VENTOLIN  HFA) 108 (90 Base) MCG/ACT inhaler Inhale 2 puffs into the lungs every 6 (six) hours as needed for wheezing or shortness of breath.   amiodarone  (PACERONE ) 200 MG tablet Take 2 tablets (400 mg total) by mouth daily. PLEASE CALL OFFICE TO  SCHEDULE APPOINTMENT PRIOR TO NEXT REFILL   aspirin  EC 81 MG tablet Take 81 mg by mouth daily.   cyclobenzaprine  (FLEXERIL ) 10 MG tablet TAKE 1 TABLET BY MOUTH EVERY 8 HOURS AS NEEDED FOR MUSCLE SPASM   digoxin  (LANOXIN ) 0.125 MG tablet Take 1 tablet (0.125 mg total) by mouth daily.   docusate sodium  (COLACE) 100 MG capsule Take 100 mg by mouth daily as needed for mild constipation.   hydrocortisone  (ANUSOL -HC) 2.5 % rectal cream Place 1 Application rectally 2 (two) times daily. (Patient taking differently: Place 1 Application rectally 2 (two) times daily as needed for anal itching or hemorrhoids.)   metoprolol  succinate (TOPROL  XL) 50 MG 24 hr tablet Take 1 tablet (50 mg total) by mouth at bedtime.   omeprazole (PRILOSEC) 20 MG capsule Take 20 mg by mouth daily.   potassium chloride  SA (KLOR-CON  M) 20 MEQ tablet Take 1 tablet (20 mEq total) by mouth daily.   rosuvastatin  (CRESTOR ) 5 MG tablet Take 1 tablet (5 mg total) by mouth daily.   sacubitril -valsartan  (ENTRESTO ) 49-51 MG Take 1 tablet by mouth 2 (two) times daily.   spironolactone  (ALDACTONE ) 25 MG tablet Take 1 tablet (25 mg total) by mouth daily.   tirzepatide  (MOUNJARO ) 12.5 MG/0.5ML Pen Inject 12.5 mg into the skin once a week.   torsemide  (DEMADEX ) 20 MG tablet TAKE 80MG  IN THE MORNING AND 40MG  IN THE EVENING   triamcinolone  cream (KENALOG ) 0.1 % Apply 1 Application topically 2 (two) times daily.   umeclidinium-vilanterol (ANORO ELLIPTA ) 62.5-25 MCG/ACT AEPB Inhale 1 puff into the lungs daily.   No current facility-administered medications on file prior to visit.    Allergies:  Allergies  Allergen Reactions   Geodon [Ziprasidone] Other (See Comments)    paralysis   Tramadol Other (See Comments)    Pt stated that it gave him sores.   Amoxicillin Rash   Zithromax [Azithromycin] Rash    Social History:  Social History   Socioeconomic History   Marital status: Married    Spouse name: Voncylla   Number of children: 1    Years of education: Not on file   Highest education level: Associate degree: occupational, Scientist, product/process development, or vocational program  Occupational History   Occupation: disability  Tobacco Use   Smoking status: Every Day    Current packs/day: 0.50    Average packs/day: 0.5 packs/day for 31.5 years (15.8 ttl pk-yrs)    Types: Cigarettes    Start date: 1994   Smokeless tobacco: Never   Tobacco comments:    Under 1/2 pack a day. Was smoking a pack a day.    No quit date, weaning numbers of cigarettes  Vaping Use   Vaping status: Never Used  Substance and Sexual Activity  Alcohol use: Not Currently   Drug use: Not Currently   Sexual activity: Yes  Other Topics Concern   Not on file  Social History Narrative   Wife, Letta, at bedside.   1 son lives in [place] and 2 step children   Social Drivers of Health   Financial Resource Strain: Low Risk  (09/16/2023)   Overall Financial Resource Strain (CARDIA)    Difficulty of Paying Living Expenses: Not very hard  Food Insecurity: No Food Insecurity (09/16/2023)   Hunger Vital Sign    Worried About Running Out of Food in the Last Year: Never true    Ran Out of Food in the Last Year: Never true  Transportation Needs: No Transportation Needs (09/16/2023)   PRAPARE - Administrator, Civil Service (Medical): No    Lack of Transportation (Non-Medical): No  Physical Activity: Inactive (09/16/2023)   Exercise Vital Sign    Days of Exercise per Week: 0 days    Minutes of Exercise per Session: 0 min  Stress: Stress Concern Present (09/16/2023)   [employer] of Occupational Health - Occupational Stress Questionnaire    Feeling of Stress : To some extent  Social Connections: Socially Integrated (09/16/2023)   Social Connection and Isolation Panel    Frequency of Communication with Friends and Family: Once a week    Frequency of Social Gatherings with Friends and Family: More than three times a week    Attends Religious Services:  More than 4 times per year    Active Member of [employer] or Organizations: Yes    Attends Engineer, structural: More than 4 times per year    Marital Status: Married  Catering manager Violence: Not At Risk (09/16/2023)   Humiliation, Afraid, Rape, and Kick questionnaire    Fear of Current or Ex-Partner: No    Emotionally Abused: No    Physically Abused: No    Sexually Abused: No   Social History   Tobacco Use  Smoking Status Every Day   Current packs/day: 0.50   Average packs/day: 0.5 packs/day for 31.5 years (15.8 ttl pk-yrs)   Types: Cigarettes   Start date: 1994  Smokeless Tobacco Never  Tobacco Comments   Under 1/2 pack a day. Was smoking a pack a day.    No quit date, weaning numbers of cigarettes   Social History   Substance and Sexual Activity  Alcohol Use Not Currently    Family History:  Family History  Problem Relation Age of Onset   Stroke Mother    Dementia Mother    Hypertension Father    Diabetes Father    Hypertension Paternal Grandfather    Diabetes Paternal Grandfather     Past medical history, surgical history, medications, allergies, family history and social history reviewed with patient today and changes made to appropriate areas of the chart.   Review of Systems  Eyes:  Negative for blurred vision and double vision.  Respiratory:  Negative for shortness of breath.   Cardiovascular:  Negative for chest pain, palpitations and leg swelling.  Musculoskeletal:        Left foot pain  Neurological:  Negative for dizziness and headaches.   All other ROS negative except what is listed above and in the HPI.      Objective:    BP 125/75   Pulse 80   Temp 98.2 F (36.8 C) (Oral)   Ht 5' 8 (1.727 m)   Wt (!) 337 lb 12.8 oz (153.2  kg)   SpO2 98%   BMI 51.36 kg/m   Wt Readings from Last 3 Encounters:  02/28/24 (!) 337 lb 12.8 oz (153.2 kg)  02/01/24 (!) 345 lb (156.5 kg)  12/06/23 (!) 357 lb 8 oz (162.2 kg)    Physical Exam Vitals and  nursing note reviewed.  Constitutional:      General: He is not in acute distress.    Appearance: Normal appearance. He is obese. He is not ill-appearing, toxic-appearing or diaphoretic.  HENT:     Head: Normocephalic.     Right Ear: Tympanic membrane, ear canal and external ear normal.     Left Ear: Tympanic membrane, ear canal and external ear normal.     Nose: Nose normal. No congestion or rhinorrhea.     Mouth/Throat:     Mouth: Mucous membranes are moist.  Eyes:     General:        Right eye: No discharge.        Left eye: No discharge.     Extraocular Movements: Extraocular movements intact.     Conjunctiva/sclera: Conjunctivae normal.     Pupils: Pupils are equal, round, and reactive to light.  Cardiovascular:     Rate and Rhythm: Normal rate and regular rhythm.     Heart sounds: No murmur heard. Pulmonary:     Effort: Pulmonary effort is normal. No respiratory distress.     Breath sounds: Normal breath sounds. No wheezing, rhonchi or rales.  Abdominal:     General: Abdomen is flat. Bowel sounds are normal. There is no distension.     Palpations: Abdomen is soft.     Tenderness: There is no abdominal tenderness. There is no guarding.  Musculoskeletal:     Cervical back: Normal range of motion and neck supple.  Skin:    General: Skin is warm and dry.     Capillary Refill: Capillary refill takes less than 2 seconds.  Neurological:     General: No focal deficit present.     Mental Status: He is alert and oriented to person, place, and time.     Cranial Nerves: No cranial nerve deficit.     Motor: No weakness.     Deep Tendon Reflexes: Reflexes normal.  Psychiatric:        Mood and Affect: Mood normal.        Behavior: Behavior normal.        Thought Content: Thought content normal.        Judgment: Judgment normal.     Results for orders placed or performed in visit on 12/16/23  CUP PACEART REMOTE DEVICE CHECK   Collection Time: 12/16/23  6:52 PM  Result Value  Ref Range   Date Time Interrogation Session [id number]    Pulse Generator Manufacturer MERM    Pulse Gen Model [id number] Cobalt XT VR MRI    Pulse Gen Serial Number [id number]    Clinic Name [facility]    Implantable Pulse Generator Type Implantable Cardiac Defibulator    Implantable Pulse Generator Implant Date 79759498    Implantable Lead Manufacturer [facility]    Implantable Lead Model [id number] Sprint Quattro Secure S MRI SureScan    Implantable Lead Serial Number [id number]    Implantable Lead Implant Date 79759498    Implantable Lead Location Detail 1 SEPTUM    Implantable Lead Location [id number]    Implantable Lead Connection Status [id number]    Lead Channel Setting Sensing Sensitivity 0.3 mV   Lead Channel Setting Pacing  Pulse Width 0.4 ms   Lead Channel Setting Pacing Amplitude 2.0 V   Zone Setting Status Active    Zone Setting Status Inactive    Zone Setting Status Inactive    Zone Setting Status [id number]    Lead Channel Impedance Value 266 ohm   Lead Channel Impedance Value 342 ohm   Lead Channel Sensing Intrinsic Amplitude 3.5 mV   Lead Channel Pacing Threshold Amplitude 1.125 V   Lead Channel Pacing Threshold Pulse Width 0.4 ms   HighPow Impedance 85 ohm   Battery Remaining Longevity 156 mo   Battery Voltage 3.03 V   Brady Statistic RV Percent Paced 0.01 %      Assessment & Plan:   Problem List Items Addressed This Visit       Cardiovascular and Mediastinum   Hypertension   Chronic.  Controlled.  Continue with current medication regimen of Entresto , Carvdilol and Spirnolactone.  Followed by HF clinic. Labs ordered today.  Return to clinic in 6 months for reevaluation.  Call sooner if concerns arise.         Relevant Orders   Comprehensive metabolic panel with GFR   Aortic atherosclerosis (HCC)   Chronic.  Controlled.  Continue with current medication regimen of ASA. Labs ordered today.  Return to clinic in 6 months for reevaluation.  Call sooner if concerns arise.        Relevant Orders   Lipid panel   Nonischemic cardiomyopathy (HCC)   Chronic.  Stable.  Followed by Cardiology.  Currently doing Cardiac rehab.  Not weighing daily. Reviewed recent Cardiology note.  - Reminded to call for an overnight weight gain of >2 pounds or a weekly weight gain of >5 pounds - not adding salt to food and read food labels. Reviewed the importance of keeping daily sodium intake to 2000mg  daily. - Avoid Ibuprofen products.         Respiratory   OSA on CPAP   Doesn't want to see feeling great.  Referral place referral for Pulmonology to manage his sleep apnea.      Relevant Orders   Ambulatory referral to Pulmonology     Other   Morbid obesity (HCC)   Chronic.  On Mounjaro  and working on weight loss.  Encourage patient to continue with weight loss efforts.      Relevant Medications   dapagliflozin  propanediol (FARXIGA ) 10 MG TABS tablet   Gout   Kenalog  given in office.  Will give another round of Cholchicine. Follow up if not improved.       Prediabetes   Chronic.  Controlled.  Continue with current medication regimen of Mounjaro .  Labs ordered today.  Return to clinic in 6 months for reevaluation.  Call sooner if concerns arise.        Relevant Orders   Hemoglobin A1c   Other Visit Diagnoses       Annual physical exam    -  Primary   Health maintenance reviewed during visit today.  Labs ordered.  Vaccines reviewed.  Cologuard up to date.   Relevant Orders   TSH   PSA   Lipid panel   CBC with Differential/Platelet   Comprehensive metabolic panel with GFR   Hemoglobin A1c        Discussed aspirin  prophylaxis for myocardial infarction prevention and decision was it was not indicated  LABORATORY TESTING:  Health maintenance labs ordered today as discussed above.   The natural history of prostate cancer and ongoing controversy regarding screening and  potential treatment outcomes of prostate cancer has been discussed with the patient. The  meaning of a false positive PSA and a false negative PSA has been discussed. He indicates understanding of the limitations of this screening test and wishes to proceed with screening PSA testing.   IMMUNIZATIONS:   - Tdap: Tetanus vaccination status reviewed: last tetanus booster within 10 years. - Influenza: Postponed to flu season - Pneumovax: Up to date - Prevnar: Up to date - COVID: Not applicable - HPV: Not applicable - Shingrix vaccine: Up to date  SCREENING: - Colonoscopy: Up to date  Discussed with patient purpose of the colonoscopy is to detect colon cancer at curable precancerous or early stages   - AAA Screening: Not applicable  -Hearing Test: Not applicable  -Spirometry: Not applicable   PATIENT COUNSELING:    Sexuality: Discussed sexually transmitted diseases, partner selection, use of condoms, avoidance of unintended pregnancy  and contraceptive alternatives.   Advised to avoid cigarette smoking.  I discussed with the patient that most people either abstain from alcohol or drink within safe limits (<=14/week and <=4 drinks/occasion for males, <=7/weeks and <= 3 drinks/occasion for females) and that the risk for alcohol disorders and other health effects rises proportionally with the number of drinks per week and how often a drinker exceeds daily limits.  Discussed cessation/primary prevention of drug use and availability of treatment for abuse.   Diet: Encouraged to adjust caloric intake to maintain  or achieve ideal body weight, to reduce intake of dietary saturated fat and total fat, to limit sodium intake by avoiding high sodium foods and not adding table salt, and to maintain adequate dietary potassium and calcium  preferably from fresh fruits, vegetables, and low-fat dairy products.    stressed the importance of regular exercise  Injury prevention: Discussed safety belts, safety helmets, smoke detector, smoking near bedding or upholstery.   Dental health:  Discussed importance of regular tooth brushing, flossing, and dental visits.   Follow up plan: NEXT PREVENTATIVE PHYSICAL DUE IN 1 YEAR. Return in about 6 months (around 08/30/2024) for HTN, HLD, DM2 FU.

## 2024-02-29 ENCOUNTER — Ambulatory Visit: Payer: Self-pay | Admitting: Nurse Practitioner

## 2024-02-29 ENCOUNTER — Encounter

## 2024-02-29 LAB — COMPREHENSIVE METABOLIC PANEL WITH GFR
ALT: 13 IU/L (ref 0–44)
AST: 14 IU/L (ref 0–40)
Albumin: 4.1 g/dL (ref 4.1–5.1)
Alkaline Phosphatase: 121 IU/L (ref 44–121)
BUN/Creatinine Ratio: 11 (ref 9–20)
BUN: 12 mg/dL (ref 6–24)
Bilirubin Total: 0.3 mg/dL (ref 0.0–1.2)
CO2: 18 mmol/L — ABNORMAL LOW (ref 20–29)
Calcium: 9.3 mg/dL (ref 8.7–10.2)
Chloride: 103 mmol/L (ref 96–106)
Creatinine, Ser: 1.06 mg/dL (ref 0.76–1.27)
Globulin, Total: 3.7 g/dL (ref 1.5–4.5)
Glucose: 76 mg/dL (ref 70–99)
Potassium: 4.2 mmol/L (ref 3.5–5.2)
Sodium: 141 mmol/L (ref 134–144)
Total Protein: 7.8 g/dL (ref 6.0–8.5)
eGFR: 86 mL/min/1.73 (ref >59)

## 2024-02-29 LAB — CBC WITH DIFFERENTIAL/PLATELET
Basophils Absolute: 0.1 x10E3/uL (ref 0.0–0.2)
Basos: 1 %
EOS (ABSOLUTE): 0 x10E3/uL (ref 0.0–0.4)
Eos: 0 %
Hematocrit: 46.4 % (ref 37.5–51.0)
Hemoglobin: 14 g/dL (ref 13.0–17.7)
Immature Grans (Abs): 0 x10E3/uL (ref 0.0–0.1)
Immature Granulocytes: 0 %
Lymphocytes Absolute: 2.2 x10E3/uL (ref 0.7–3.1)
Lymphs: 25 %
MCH: 22.9 pg — ABNORMAL LOW (ref 26.6–33.0)
MCHC: 30.2 g/dL — ABNORMAL LOW (ref 31.5–35.7)
MCV: 76 fL — ABNORMAL LOW (ref 79–97)
Monocytes Absolute: 0.5 x10E3/uL (ref 0.1–0.9)
Monocytes: 6 %
Neutrophils Absolute: 6.1 x10E3/uL (ref 1.4–7.0)
Neutrophils: 68 %
Platelets: 288 x10E3/uL (ref 150–450)
RBC: 6.12 x10E6/uL — ABNORMAL HIGH (ref 4.14–5.80)
RDW: 18.2 % — ABNORMAL HIGH (ref 11.6–15.4)
WBC: 8.9 x10E3/uL (ref 3.4–10.8)

## 2024-02-29 LAB — LIPID PANEL
Chol/HDL Ratio: 3.3 ratio (ref 0.0–5.0)
Cholesterol, Total: 149 mg/dL (ref 100–199)
HDL: 45 mg/dL (ref >39)
LDL Chol Calc (NIH): 78 mg/dL (ref 0–99)
Triglycerides: 147 mg/dL (ref 0–149)
VLDL Cholesterol Cal: 26 mg/dL (ref 5–40)

## 2024-02-29 LAB — HEMOGLOBIN A1C
Est. average glucose Bld gHb Est-mCnc: 111 mg/dL
Hgb A1c MFr Bld: 5.5 % (ref 4.8–5.6)

## 2024-02-29 LAB — TSH: TSH: 1.15 u[IU]/mL (ref 0.450–4.500)

## 2024-02-29 LAB — PSA: Prostate Specific Ag, Serum: <0.1 ng/mL (ref 0.0–4.0)

## 2024-03-02 ENCOUNTER — Encounter

## 2024-03-06 ENCOUNTER — Ambulatory Visit: Admitting: Sleep Medicine

## 2024-03-06 ENCOUNTER — Telehealth: Payer: Self-pay

## 2024-03-06 NOTE — Telephone Encounter (Signed)
 No answer. Mailbox full.

## 2024-03-07 ENCOUNTER — Encounter: Payer: Self-pay | Admitting: *Deleted

## 2024-03-07 ENCOUNTER — Encounter

## 2024-03-07 DIAGNOSIS — I5022 Chronic systolic (congestive) heart failure: Secondary | ICD-10-CM

## 2024-03-07 NOTE — Progress Notes (Signed)
 Early Discharge Summary:   Marc [name]  DOB: 11-25-1975   Discharging patient at this time due to lack of attendance. Staff has reached out to patient with no response.He completed 12 of 36 sessions.    6 Minute Walk     Row Name 12/06/23 1120         6 Minute Walk   Phase Initial     Distance 705 feet     Walk Time 6 minutes     # of Rest Breaks 0     MPH 1.34     METS 1.57     RPE 13     Perceived Dyspnea  1     VO2 Peak 5.49     Symptoms Yes (comment)     Comments Bilateral hip and feet pain (5/10)     Resting HR 67 bpm     Resting BP 104/62     Resting Oxygen Saturation  96 %     Exercise Oxygen Saturation  during 6 min walk 99 %     Max Ex. HR 113 bpm     Max Ex. BP 130/70     2 Minute Post BP 100/68

## 2024-03-07 NOTE — Progress Notes (Signed)
 Pulmonary Individual Treatment Plan  Patient Details  Name: BELFORD PASCUCCI MRN: [id number] Date of Birth: 27-Dec-1975 Referring Provider:   Flowsheet Row Cardiac Rehab from 12/06/2023 in [facility] Cardiac and Pulmonary Rehab  Referring Provider Gardenia Led, DO    Initial Encounter Date:  Flowsheet Row Cardiac Rehab from 12/06/2023 in [facility] Cardiac and Pulmonary Rehab  Date 12/06/23    Visit Diagnosis: Chronic systolic heart failure (HCC)  Patient's Home Medications on Admission:  Current Outpatient Medications:    albuterol  (VENTOLIN  HFA) 108 (90 Base) MCG/ACT inhaler, Inhale 2 puffs into the lungs every 6 (six) hours as needed for wheezing or shortness of breath., Disp: 18 g, Rfl: 0   amiodarone  (PACERONE ) 200 MG tablet, Take 2 tablets (400 mg total) by mouth daily. PLEASE CALL OFFICE TO SCHEDULE APPOINTMENT PRIOR TO NEXT REFILL, Disp: 180 tablet, Rfl: 0   aspirin  EC 81 MG tablet, Take 81 mg by mouth daily., Disp: , Rfl:    colchicine  0.6 MG tablet, Take 1 tablet (0.6 mg total) by mouth 2 (two) times daily., Disp: 14 tablet, Rfl: 0   cyclobenzaprine  (FLEXERIL ) 10 MG tablet, TAKE 1 TABLET BY MOUTH EVERY 8 HOURS AS NEEDED FOR MUSCLE SPASM, Disp: 30 tablet, Rfl: 0   dapagliflozin  propanediol (FARXIGA ) 10 MG TABS tablet, Take 1 tablet (10 mg total) by mouth daily before breakfast., Disp: 90 tablet, Rfl: 1   digoxin  (LANOXIN ) 0.125 MG tablet, Take 1 tablet (0.125 mg total) by mouth daily., Disp: 90 tablet, Rfl: 3   docusate sodium  (COLACE) 100 MG capsule, Take 100 mg by mouth daily as needed for mild constipation., Disp: , Rfl:    hydrocortisone  (ANUSOL -HC) 2.5 % rectal cream, Place 1 Application rectally 2 (two) times daily. (Patient taking differently: Place 1 Application rectally 2 (two) times daily as needed for anal itching or hemorrhoids.), Disp: 30 g, Rfl: 0   metoprolol  succinate (TOPROL  XL) 50 MG 24 hr tablet, Take 1 tablet (50 mg total) by mouth at bedtime., Disp: 30 tablet, Rfl:  3   omeprazole (PRILOSEC) 20 MG capsule, Take 20 mg by mouth daily., Disp: , Rfl:    potassium chloride  SA (KLOR-CON  M) 20 MEQ tablet, Take 1 tablet (20 mEq total) by mouth daily., Disp: 30 tablet, Rfl: 3   rosuvastatin  (CRESTOR ) 5 MG tablet, Take 1 tablet (5 mg total) by mouth daily., Disp: 90 tablet, Rfl: 1   sacubitril -valsartan  (ENTRESTO ) 49-51 MG, Take 1 tablet by mouth 2 (two) times daily., Disp: 180 tablet, Rfl: 3   spironolactone  (ALDACTONE ) 25 MG tablet, Take 1 tablet (25 mg total) by mouth daily., Disp: 100 tablet, Rfl: 2   tirzepatide  (MOUNJARO ) 12.5 MG/0.5ML Pen, INJECT 12.5 MG SUBCUTANEOUSLY  ONCE A WEEK, Disp: 2 mL, Rfl: 11   torsemide  (DEMADEX ) 20 MG tablet, TAKE 80MG  IN THE MORNING AND 40MG  IN THE EVENING, Disp: 180 tablet, Rfl: 3   triamcinolone  cream (KENALOG ) 0.1 %, Apply 1 Application topically 2 (two) times daily., Disp: 30 g, Rfl: 0   umeclidinium-vilanterol (ANORO ELLIPTA ) 62.5-25 MCG/ACT AEPB, Inhale 1 puff into the lungs daily., Disp: 1 each, Rfl: 1  Past Medical History: Past Medical History:  Diagnosis Date   CHF (congestive heart failure) (HCC)    Hypertension     Tobacco Use: Social History   Tobacco Use  Smoking Status Every Day   Current packs/day: 0.50   Average packs/day: 0.5 packs/day for 31.6 years (15.8 ttl pk-yrs)   Types: Cigarettes   Start date: 55  Smokeless Tobacco Never  Tobacco Comments   Under 1/2 pack a day. Was smoking a pack a day.    No quit date, weaning numbers of cigarettes    Labs: Review Flowsheet  More data exists      Latest Ref Rng & Units 03/16/2023 04/22/2023 07/01/2023 09/13/2023 02/28/2024  Labs for ITP Cardiac and Pulmonary Rehab  Cholestrol 100 - 199 mg/dL - - 782  - 850   LDL (calc) 0 - 99 mg/dL - - 860  - 78   HDL-C >60 mg/dL - - 49  - 45   Trlycerides 0 - 149 mg/dL - - 837  - 852   Hemoglobin A1c 4.8 - 5.6 % - 6.4  6.0  6.1  5.5   Bicarbonate 20.0 - 28.0 mmol/L 20.0 - 28.0 mmol/L 27.9  27.5  - - - -  TCO2 22 -  32 mmol/L 22 - 32 mmol/L 29  29  - - - -  O2 Saturation % % 56  58  - - - -    Details       Multiple values from one day are sorted in reverse-chronological order          Pulmonary Assessment Scores:   UCSD: Self-administered rating of dyspnea associated with activities of daily living (ADLs) 6-point scale (0 = not at all to 5 = maximal or unable to do because of breathlessness)  Scoring Scores range from 0 to 120.  Minimally important difference is 5 units  CAT: CAT can identify the health impairment of COPD patients and is better correlated with disease progression.  CAT has a scoring range of zero to 40. The CAT score is classified into four groups of low (less than 10), medium (10 - 20), high (21-30) and very high (31-40) based on the impact level of disease on health status. A CAT score over 10 suggests significant symptoms.  A worsening CAT score could be explained by an exacerbation, poor medication adherence, poor inhaler technique, or progression of COPD or comorbid conditions.  CAT MCID is 2 points  mMRC: mMRC (Modified Medical Research Council) Dyspnea Scale is used to assess the degree of baseline functional disability in patients of respiratory disease due to dyspnea. No minimal important difference is established. A decrease in score of 1 point or greater is considered a positive change.   Pulmonary Function Assessment:   Exercise Target Goals: Exercise Program Goal: Individual exercise prescription set using results from initial 6 min walk test and THRR while considering  patient's activity barriers and safety.   Exercise Prescription Goal: Initial exercise prescription builds to 30-45 minutes a day of aerobic activity, 2-3 days per week.  Home exercise guidelines will be given to patient during program as part of exercise prescription that the participant will acknowledge.  Education: Aerobic Exercise: - Group verbal and visual presentation on the  components of exercise prescription. Introduces F.I.T.T principle from ACSM for exercise prescriptions.  Reviews F.I.T.T. principles of aerobic exercise including progression. Written material given at graduation. Flowsheet Row Cardiac Rehab from 01/13/2024 in [facility] Cardiac and Pulmonary Rehab  Date 01/13/24  Educator MB  Instruction Review Code 1- Verbalizes Understanding    Education: Resistance Exercise: - Group verbal and visual presentation on the components of exercise prescription. Introduces F.I.T.T principle from ACSM for exercise prescriptions  Reviews F.I.T.T. principles of resistance exercise including progression. Written material given at graduation.    Education: Exercise & Equipment Safety: - Individual verbal instruction and demonstration of equipment use and safety  with use of the equipment. Flowsheet Row Cardiac Rehab from 01/13/2024 in [facility] Cardiac and Pulmonary Rehab  Date 12/06/23  Educator MB  Instruction Review Code 1- Verbalizes Understanding    Education: Exercise Physiology & General Exercise Guidelines: - Group verbal and written instruction with models to review the exercise physiology of the cardiovascular system and associated critical values. Provides general exercise guidelines with specific guidelines to those with heart or lung disease.    Education: Flexibility, Balance, Mind/Body Relaxation: - Group verbal and visual presentation with interactive activity on the components of exercise prescription. Introduces F.I.T.T principle from ACSM for exercise prescriptions. Reviews F.I.T.T. principles of flexibility and balance exercise training including progression. Also discusses the mind body connection.  Reviews various relaxation techniques to help reduce and manage stress (i.e. Deep breathing, progressive muscle relaxation, and visualization). Balance handout provided to take home. Written material given at graduation.   Activity Barriers & Risk  Stratification:  Activity Barriers & Cardiac Risk Stratification - 12/06/23 1123       Activity Barriers & Cardiac Risk Stratification   Activity Barriers Joint Problems;Arthritis   Knees (Prevent from standing long periods of time)   Cardiac Risk Stratification High          6 Minute Walk:  6 Minute Walk     Row Name 12/06/23 1120         6 Minute Walk   Phase Initial     Distance 705 feet     Walk Time 6 minutes     # of Rest Breaks 0     MPH 1.34     METS 1.57     RPE 13     Perceived Dyspnea  1     VO2 Peak 5.49     Symptoms Yes (comment)     Comments Bilateral hip and feet pain (5/10)     Resting HR 67 bpm     Resting BP 104/62     Resting Oxygen Saturation  96 %     Exercise Oxygen Saturation  during 6 min walk 99 %     Max Ex. HR 113 bpm     Max Ex. BP 130/70     2 Minute Post BP 100/68       Oxygen Initial Assessment:   Oxygen Re-Evaluation:   Oxygen Discharge (Final Oxygen Re-Evaluation):   Initial Exercise Prescription:  Initial Exercise Prescription - 12/06/23 1100       Date of Initial Exercise RX and Referring Provider   Date 12/06/23    Referring Provider Gardenia Led, DO      Oxygen   Maintain Oxygen Saturation 88% or higher      Treadmill   MPH 0.7    Grade 0    Minutes 15    METs 1.57      Recumbant Bike   Level 1    RPM 50    Watts 15    Minutes 15    METs 1.57      NuStep   Level 1    SPM 80    Minutes 15    METs 1.57      Arm Ergometer   Level 1    Watts 15    RPM 30    Minutes 15    METs 1.57      T5 Nustep   Level 1    SPM 80    Minutes 15    METs 1.57      Track  Laps 10    Minutes 15    METs 1.54      Prescription Details   Frequency (times per week) 2    Duration Progress to 30 minutes of continuous aerobic without signs/symptoms of physical distress      Intensity   THRR 40-80% of Max Heartrate 108-150    Ratings of Perceived Exertion 11-13    Perceived Dyspnea 0-4       Progression   Progression Continue to progress workloads to maintain intensity without signs/symptoms of physical distress.      Resistance Training   Training Prescription Yes    Weight 4, 7 lb   (4lb for L arm)   Reps 10-15          Perform Capillary Blood Glucose checks as needed.  Exercise Prescription Changes:   Exercise Prescription Changes     Row Name 12/06/23 1100 12/15/23 1500 12/31/23 1100 01/11/24 1300 01/27/24 0900     Response to Exercise   Blood Pressure (Admit) 104/62 118/66 114/66 100/60 124/80   Blood Pressure (Exercise) 130/70 126/64 138/66 124/66 138/70   Blood Pressure (Exit) 100/68 118/60 128/68 108/62 102/70   Heart Rate (Admit) 67 bpm 80 bpm 94 bpm 77 bpm 68 bpm   Heart Rate (Exercise) 113 bpm 109 bpm 129 bpm 102 bpm 103 bpm   Heart Rate (Exit) 72 bpm 69 bpm 76 bpm 88 bpm 76 bpm   Oxygen Saturation (Admit) 96 % -- -- -- --   Oxygen Saturation (Exercise) 99 % -- -- -- --   Oxygen Saturation (Exit) 97 % -- -- -- --   Rating of Perceived Exertion (Exercise) 13 13 14 12 15    Perceived Dyspnea (Exercise) 1 -- -- -- --   Symptoms Bilateral hip and feet pain (5/10) none hip pain none none   Comments results First two days of exercise -- -- --   Duration Progress to 30 minutes of  aerobic without signs/symptoms of physical distress Continue with 30 min of aerobic exercise without signs/symptoms of physical distress. Continue with 30 min of aerobic exercise without signs/symptoms of physical distress. Continue with 30 min of aerobic exercise without signs/symptoms of physical distress. Continue with 30 min of aerobic exercise without signs/symptoms of physical distress.   Intensity THRR New THRR unchanged THRR unchanged THRR unchanged THRR unchanged     Progression   Progression Continue to progress workloads to maintain intensity without signs/symptoms of physical distress. Continue to progress workloads to maintain intensity without signs/symptoms of  physical distress. Continue to progress workloads to maintain intensity without signs/symptoms of physical distress. Continue to progress workloads to maintain intensity without signs/symptoms of physical distress. Continue to progress workloads to maintain intensity without signs/symptoms of physical distress.   Average METs 1.57 1.53 1.53 1.45 1.6     Resistance Training   Training Prescription -- Yes Yes Yes Yes   Weight -- 7 lb  (4lb for L arm) 5 lb  (4lb for L arm) 5 lb  (4lb for L arm) 5 lb  (4lb for L arm)   Reps -- 10-15 10-15 10-15 10-15     Interval Training   Interval Training -- No No No No     Treadmill   MPH -- -- 1 -- --   Grade -- -- 0 -- --   Minutes -- -- 15 -- --   METs -- -- 1.77 -- --     NuStep   Level -- 2 4 -- 5  Minutes -- 15 15 -- 15   METs -- 1.5 1.6 -- 2.4     Arm Ergometer   Level -- 1.2 4 2 5    Minutes -- 15 15 15 15    METs -- 1 1 1 1      T5 Nustep   Level -- 1 4 3 5    Minutes -- 30 15 15 15    METs -- 1.8 2 1.9 2     Oxygen   Maintain Oxygen Saturation -- 88% or higher 88% or higher 88% or higher 88% or higher    Row Name 02/10/24 0800 02/22/24 1000           Response to Exercise   Blood Pressure (Admit) 122/64 116/60      Blood Pressure (Exercise) 138/68 --      Blood Pressure (Exit) 110/60 122/70      Heart Rate (Admit) 98 bpm 47 bpm      Heart Rate (Exercise) 110 bpm 80 bpm      Heart Rate (Exit) 88 bpm 80 bpm      Rating of Perceived Exertion (Exercise) 15 13      Symptoms none none      Duration Continue with 30 min of aerobic exercise without signs/symptoms of physical distress. Continue with 30 min of aerobic exercise without signs/symptoms of physical distress.      Intensity THRR unchanged THRR unchanged        Progression   Progression Continue to progress workloads to maintain intensity without signs/symptoms of physical distress. Continue to progress workloads to maintain intensity without signs/symptoms of physical  distress.      Average METs 1.7 1.8        Resistance Training   Training Prescription Yes Yes      Weight 5 lb  (4lb for L arm) 5 lb  (4lb for L arm)      Reps 10-15 10-15        Interval Training   Interval Training No No        NuStep   Level 6 --      Minutes 15 --      METs 2.4 --        Arm Ergometer   Level 5 3.5      Minutes 15 15      METs 1 --        T5 Nustep   Level -- 4      Minutes -- 15      METs -- 1.8        Oxygen   Maintain Oxygen Saturation 88% or higher 88% or higher         Exercise Comments:   Exercise Comments     Row Name 12/07/23 1005           Exercise Comments First full day of exercise!  Patient was oriented to gym and equipment including functions, settings, policies, and procedures.  Patient's individual exercise prescription and treatment plan were reviewed.  All starting workloads were established based on the results of the 6 minute walk test done at initial orientation visit.  The plan for exercise progression was also introduced and progression will be customized based on patient's performance and goals.          Exercise Goals and Review:   Exercise Goals     Row Name 12/06/23 1128             Exercise Goals   Increase Physical  Activity Yes       Intervention Provide advice, education, support and counseling about physical activity/exercise needs.;Develop an individualized exercise prescription for aerobic and resistive training based on initial evaluation findings, risk stratification, comorbidities and participant's personal goals.       Expected Outcomes Short Term: Attend rehab on a regular basis to increase amount of physical activity.;Long Term: Add in home exercise to make exercise part of routine and to increase amount of physical activity.;Long Term: Exercising regularly at least 3-5 days a week.       Increase Strength and Stamina Yes       Intervention Provide advice, education, support and counseling about  physical activity/exercise needs.;Develop an individualized exercise prescription for aerobic and resistive training based on initial evaluation findings, risk stratification, comorbidities and participant's personal goals.       Expected Outcomes Short Term: Increase workloads from initial exercise prescription for resistance, speed, and METs.;Short Term: Perform resistance training exercises routinely during rehab and add in resistance training at home;Long Term: Improve cardiorespiratory fitness, muscular endurance and strength as measured by increased METs and functional capacity ( )       Able to understand and use rate of perceived exertion (RPE) scale Yes       Intervention Provide education and explanation on how to use RPE scale       Expected Outcomes Short Term: Able to use RPE daily in rehab to express subjective intensity level;Long Term:  Able to use RPE to guide intensity level when exercising independently       Able to understand and use Dyspnea scale Yes       Intervention Provide education and explanation on how to use Dyspnea scale       Expected Outcomes Short Term: Able to use Dyspnea scale daily in rehab to express subjective sense of shortness of breath during exertion;Long Term: Able to use Dyspnea scale to guide intensity level when exercising independently       Knowledge and understanding of Target Heart Rate Range (THRR) Yes       Intervention Provide education and explanation of THRR including how the numbers were predicted and where they are located for reference       Expected Outcomes Short Term: Able to state/look up THRR;Short Term: Able to use daily as guideline for intensity in rehab;Long Term: Able to use THRR to govern intensity when exercising independently       Able to check pulse independently Yes       Intervention Provide education and demonstration on how to check pulse in carotid and radial arteries.;Review the importance of being able to check your own  pulse for safety during independent exercise       Expected Outcomes Short Term: Able to explain why pulse checking is important during independent exercise;Long Term: Able to check pulse independently and accurately       Understanding of Exercise Prescription Yes       Intervention Provide education, explanation, and written materials on patient's individual exercise prescription       Expected Outcomes Short Term: Able to explain program exercise prescription;Long Term: Able to explain home exercise prescription to exercise independently          Exercise Goals Re-Evaluation :  Exercise Goals Re-Evaluation     Row Name 12/07/23 1005 12/15/23 1530 12/31/23 1127 01/11/24 1342 01/27/24 0958     Exercise Goal Re-Evaluation   Exercise Goals Review Understanding of Exercise Prescription;Knowledge and understanding of Target Heart  Rate Range (THRR);Able to understand and use rate of perceived exertion (RPE) scale;Able to understand and use Dyspnea scale Increase Physical Activity;Increase Strength and Stamina;Understanding of Exercise Prescription Increase Physical Activity;Increase Strength and Stamina;Understanding of Exercise Prescription Increase Physical Activity;Increase Strength and Stamina;Understanding of Exercise Prescription Increase Physical Activity;Increase Strength and Stamina;Understanding of Exercise Prescription   Comments Reviewed RPE and dyspnea scale, THR and program prescription with pt today.  Pt voiced understanding and was given a copy of goals to take home. Gerrit is off to a good start in rehab. He did well on the T5 nustep machine at level 1 during his first session. He also did well on the T4 nustep at level 2 and the arm crank at level 1.2 during his second session. We will continue to monitor his progress in the program. Khale is doing well in rehab. He did well on the treadmill at a speed of 1 mph with no incline, while dealing with hip pain. He also improved to level 4 on  the T5 nustep, T4 nustep, and arm crank. We will continue to monitor his progress in the program. Keaston only attended one session of rehab since the last review. He was able to continue to work at level 3 on the T5 nustep and level 2 on the arm ergometer. He also continues to do well with 5 lb hand weights for resistance training. We will continue to monitor his progress in the program. Xzander is doing well in rehab and attended 2 sessions of rehab in this review. He was able to increase to level 5 on both the T4 and T5 nusteps. He also was able to increase to level 5 on the arm ergometer. We will continue to monitor his progress in the program.   Expected Outcomes Short: Use RPE daily to regulate intensity. Long: Follow program prescription in THR. Short: Continue to follow current exercise prescription. Long: Continue exercise to improve strength and stamina. Short: Continue to walk on the treadmill as tolerated by hip pain. Long: Continue exercise to improve strength and stamina. Short: Attend rehab more consistently. Long: Continue exercise to improve strength and stamina. Short: Attend rehab more consistently and progressively increase workloads on the nustep and arm ergometer. Long: Continue exercise to improve strength and stamina.    Row Name 02/10/24 9192 02/22/24 1030           Exercise Goal Re-Evaluation   Exercise Goals Review Increase Physical Activity;Increase Strength and Stamina;Understanding of Exercise Prescription Increase Physical Activity;Increase Strength and Stamina;Understanding of Exercise Prescription      Comments Tobias has only attended one session since the last review. During his one session he improved to level 6 on the T4 nustep. He also continues to work at level 5 on the arm crank. We will continue to monitor his progress in the program. Govind was only able to attend one session during this review period. During his one session he was able to use the T5 nustep at level  4, and the arm ergometer at level 3.5. We will conitnue to monitor his progress in the program.      Expected Outcomes Short: Attend rehab more consistently. Long: Continue exercise to improve strength and stamina. Short: Attend rehab more consistently. Long: Continue exercise to improve strength and stamina.         Discharge Exercise Prescription (Final Exercise Prescription Changes):  Exercise Prescription Changes - 02/22/24 1000       Response to Exercise   Blood Pressure (Admit) 116/60  Blood Pressure (Exit) 122/70    Heart Rate (Admit) 47 bpm    Heart Rate (Exercise) 80 bpm    Heart Rate (Exit) 80 bpm    Rating of Perceived Exertion (Exercise) 13    Symptoms none    Duration Continue with 30 min of aerobic exercise without signs/symptoms of physical distress.    Intensity THRR unchanged      Progression   Progression Continue to progress workloads to maintain intensity without signs/symptoms of physical distress.    Average METs 1.8      Resistance Training   Training Prescription Yes    Weight 5 lb   (4lb for L arm)   Reps 10-15      Interval Training   Interval Training No      Arm Ergometer   Level 3.5    Minutes 15      T5 Nustep   Level 4    Minutes 15    METs 1.8      Oxygen   Maintain Oxygen Saturation 88% or higher          Nutrition:  Target Goals: Understanding of nutrition guidelines, daily intake of sodium 1500mg , cholesterol 200mg , calories 30% from fat and 7% or less from saturated fats, daily to have 5 or more servings of fruits and vegetables.  Education: All About Nutrition: -Group instruction provided by verbal, written material, interactive activities, discussions, models, and posters to present general guidelines for heart healthy nutrition including fat, fiber, MyPlate, the role of sodium in heart healthy nutrition, utilization of the nutrition label, and utilization of this knowledge for meal planning. Follow up email sent as well.  Written material given at graduation.   Biometrics:  Pre Biometrics - 12/06/23 1128       Pre Biometrics   Height 5' 8.5 (1.74 m)    Weight 357 lb 8 oz (162.2 kg)    Waist Circumference 61 inches    Hip Circumference 63 inches    Waist to Hip Ratio 0.97 %    BMI (Calculated) 53.56    Single Leg Stand 6 seconds           Nutrition Therapy Plan and Nutrition Goals:  Nutrition Therapy & Goals - 12/06/23 1348       Nutrition Therapy   Diet Cardiac, Low Na    Protein (specify units) 90    Fiber 30 grams    Whole Grain Foods 3 servings    Saturated Fats 15 max. grams    Fruits and Vegetables 5 servings/day    Sodium 1.5 grams      Personal Nutrition Goals   Nutrition Goal Read labels and reduce sodium intake to below 2300mg . Ideally 1500mg  per day.    Personal Goal #2 Eat 15-30gProtein and 30-60gCarbs at each meal.    Personal Goal #3 Include more colorful produce, aim for 5-8 servings of fruits and veggies per day    Comments Patient is currently drinking some soda, but has been working on cutting back on juice. Discussed sugary beverages and how they affect his blood sugar and weight. Reccommended he switch to water or sugar free alternatives. He reports he usually only eats one meal. Will snack on sweets or junk foods before bed. He reports its a habit and enjoys those foods. Says he doesn't eat in the mornings because he is on a lot of meds and often doesn't feel like eating. His wife has been trying to get him to eat in the  mornings. He is willing to work on this, but would like some help. Spoke with him about the goals of morning meals and how to make the meals smaller and easy to pull off even when he doesn't feel like eating much. Brainstormed several breakfast ideas that have protein, complex carbs and keep sodium controlled. Provided him with a guideline of less than 1500mg  sodium per day. He says he doesn't use salt at the table. Reminded him to read labels and make sure  the foods he is eating are not already very salty. Provided mediterranean diet handout. Educated on types of fats, sources, and how to read labels. Went over balanced plates handout and encouraged more veggies and low calorie dense meals with larger meals.      Intervention Plan   Intervention Prescribe, educate and counsel regarding individualized specific dietary modifications aiming towards targeted core components such as weight, hypertension, lipid management, diabetes, heart failure and other comorbidities.;Nutrition handout(s) given to patient.    Expected Outcomes Short Term Goal: Understand basic principles of dietary content, such as calories, fat, sodium, cholesterol and nutrients.;Short Term Goal: A plan has been developed with personal nutrition goals set during dietitian appointment.;Long Term Goal: Adherence to prescribed nutrition plan.          Nutrition Assessments:  MEDIFICTS Score Key: >=70 Need to make dietary changes  40-70 Heart Healthy Diet <= 40 Therapeutic Level Cholesterol Diet   Picture Your Plate Scores: <59 Unhealthy dietary pattern with much room for improvement. 41-50 Dietary pattern unlikely to meet recommendations for good health and room for improvement. 51-60 More healthful dietary pattern, with some room for improvement.  >60 Healthy dietary pattern, although there may be some specific behaviors that could be improved.   Nutrition Goals Re-Evaluation:  Nutrition Goals Re-Evaluation     Row Name 01/11/24 0947             Goals   Comment Skip's Mounjaro  dose was increased and he struggles to eat much. Currently eating one meal per day, says some days he doesn't want to eat that meal. Reminded him of discussion had with RD about eating smaller more frequent meals or snacks, even when not hungry because Moujaro is very effective at reducing appetite.       Expected Outcome STG: Eat small frequent meals/snacks. LTG: Follow a heart healthy diet that  supports body's needs          Nutrition Goals Discharge (Final Nutrition Goals Re-Evaluation):  Nutrition Goals Re-Evaluation - 01/11/24 0947       Goals   Comment Kush's Mounjaro  dose was increased and he struggles to eat much. Currently eating one meal per day, says some days he doesn't want to eat that meal. Reminded him of discussion had with RD about eating smaller more frequent meals or snacks, even when not hungry because Moujaro is very effective at reducing appetite.    Expected Outcome STG: Eat small frequent meals/snacks. LTG: Follow a heart healthy diet that supports body's needs          Psychosocial: Target Goals: Acknowledge presence or absence of significant depression and/or stress, maximize coping skills, provide positive support system. Participant is able to verbalize types and ability to use techniques and skills needed for reducing stress and depression.   Education: Stress, Anxiety, and Depression - Group verbal and visual presentation to define topics covered.  Reviews how body is impacted by stress, anxiety, and depression.  Also discusses healthy ways to reduce stress and  to treat/manage anxiety and depression.  Written material given at graduation.   Education: Sleep Hygiene -Provides group verbal and written instruction about how sleep can affect your health.  Define sleep hygiene, discuss sleep cycles and impact of sleep habits. Review good sleep hygiene tips.    Initial Review & Psychosocial Screening:  Initial Psych Review & Screening - 11/25/23 1342       Initial Review   Current issues with None Identified      Family Dynamics   Good Support System? Yes   wife and sister     Barriers   Psychosocial barriers to participate in program There are no identifiable barriers or psychosocial needs.      Screening Interventions   Interventions To provide support and resources with identified psychosocial needs;Provide feedback about the scores to  participant    Expected Outcomes Short Term goal: Utilizing psychosocial counselor, staff and physician to assist with identification of specific Stressors or current issues interfering with healing process. Setting desired goal for each stressor or current issue identified.;Long Term Goal: Stressors or current issues are controlled or eliminated.;Short Term goal: Identification and review with participant of any Quality of Life or Depression concerns found by scoring the questionnaire.;Long Term goal: The participant improves quality of Life and PHQ9 Scores as seen by post scores and/or verbalization of changes          Quality of Life Scores:  Scores of 19 and below usually indicate a poorer quality of life in these areas.  A difference of  2-3 points is a clinically meaningful difference.  A difference of 2-3 points in the total score of the Quality of Life Index has been associated with significant improvement in overall quality of life, self-image, physical symptoms, and general health in studies assessing change in quality of life.  PHQ-9: Review Flowsheet  More data exists      02/28/2024 02/01/2024 01/11/2024 12/06/2023 09/16/2023  Depression screen PHQ 2/9  Decreased Interest 0 0 1 1 1   Down, Depressed, Hopeless 0 0 0 0 1  PHQ - 2 Score 0 0 1 1 2   Altered sleeping 3 0 2 2 1   Tired, decreased energy 2 0 2 2 1   Change in appetite 2 0 3 3 0  Feeling bad or failure about yourself  0 0 1 1 0  Trouble concentrating 0 0 1 1 0  Moving slowly or fidgety/restless 0 0 0 0 0  Suicidal thoughts 0 0 0 0 0  PHQ-9 Score 7 0 10 10 4   Difficult doing work/chores Not difficult at all - Somewhat difficult Somewhat difficult Somewhat difficult   Interpretation of Total Score  Total Score Depression Severity:  1-4 = Minimal depression, 5-9 = Mild depression, 10-14 = Moderate depression, 15-19 = Moderately severe depression, 20-27 = Severe depression   Psychosocial Evaluation and Intervention:   Psychosocial Evaluation - 11/25/23 1410       Psychosocial Evaluation & Interventions   Interventions Encouraged to exercise with the program and follow exercise prescription    Comments THere are no barriers to attending the program.  He lives with his wife, adult daughter(austistic/non verbal) and mother in law.  He is ready to start the program.  He is working on weight loss.  He states he has no concerns.   He is working on tobacco cessation and doe not have a quit date ,;he has decreased from a pack to 1/2 a pack aday. He is ready to start the program.  Expected Outcomes STG attend all sessions, conntinue working on tobacco cessation and weight loss. Work on exericse progression.  LTG Able to maintain/continue tobacco cessation, weight loss and exercise progression    Continue Psychosocial Services  Follow up required by staff          Psychosocial Re-Evaluation:  Psychosocial Re-Evaluation     Row Name 01/11/24 [phone]             Psychosocial Re-Evaluation   Current issues with Current Sleep Concerns;Current Stress Concerns;Current Anxiety/Panic       Comments Completed PHQ-9, scored 10. His appetite has gotten worse with the dose increase in his Mounjaro . He has anxiety and depression ongoing, his medical team is aware and working wit him. Is not sleeping well most nights.       Expected Outcomes STG: Take meds as prescribed, use support systems as needed and focus on good sleep hygiene. LTG: Achieve and maintain a positive outlook on health and daily life       Interventions Encouraged to attend Cardiac Rehabilitation for the exercise       Continue Psychosocial Services  Follow up required by staff          Psychosocial Discharge (Final Psychosocial Re-Evaluation):  Psychosocial Re-Evaluation - 01/11/24 0942       Psychosocial Re-Evaluation   Current issues with Current Sleep Concerns;Current Stress Concerns;Current Anxiety/Panic    Comments Completed PHQ-9, scored 10.  His appetite has gotten worse with the dose increase in his Mounjaro . He has anxiety and depression ongoing, his medical team is aware and working wit him. Is not sleeping well most nights.    Expected Outcomes STG: Take meds as prescribed, use support systems as needed and focus on good sleep hygiene. LTG: Achieve and maintain a positive outlook on health and daily life    Interventions Encouraged to attend Cardiac Rehabilitation for the exercise    Continue Psychosocial Services  Follow up required by staff          Education: Education Goals: Education classes will be provided on a weekly basis, covering required topics. Participant will state understanding/return demonstration of topics presented.  Learning Barriers/Preferences:   General Pulmonary Education Topics:  Infection Prevention: - Provides verbal and written material to individual with discussion of infection control including proper hand washing and proper equipment cleaning during exercise session. Flowsheet Row Cardiac Rehab from 01/13/2024 in [facility] Cardiac and Pulmonary Rehab  Date 12/06/23  Educator MB  Instruction Review Code 1- Verbalizes Understanding    Falls Prevention: - Provides verbal and written material to individual with discussion of falls prevention and safety. Flowsheet Row Cardiac Rehab from 01/13/2024 in [facility] Cardiac and Pulmonary Rehab  Date 12/06/23  Educator MB  Instruction Review Code 1- Verbalizes Understanding    Chronic Lung Disease Review: - Group verbal instruction with posters, models, PowerPoint presentations and videos,  to review new updates, new respiratory medications, new advancements in procedures and treatments. Providing information on websites and 800 numbers for continued self-education. Includes information about supplement oxygen, available portable oxygen systems, continuous and intermittent flow rates, oxygen safety, concentrators, and Medicare reimbursement for oxygen.  Explanation of Pulmonary Drugs, including class, frequency, complications, importance of spacers, rinsing mouth after steroid MDI's, and proper cleaning methods for nebulizers. Review of basic lung anatomy and physiology related to function, structure, and complications of lung disease. Review of risk factors. Discussion about methods for diagnosing sleep apnea and types of masks and machines for OSA. Includes a review  of the use of types of environmental controls: home humidity, furnaces, filters, dust mite/pet prevention, HEPA vacuums. Discussion about weather changes, air quality and the benefits of nasal washing. Instruction on Warning signs, infection symptoms, calling MD promptly, preventive modes, and value of vaccinations. Review of effective airway clearance, coughing and/or vibration techniques. Emphasizing that all should Create an Action Plan. Written material given at graduation.   AED/CPR: - Group verbal and written instruction with the use of models to demonstrate the basic use of the AED with the basic ABC's of resuscitation.    Anatomy and Cardiac Procedures: - Group verbal and visual presentation and models provide information about basic cardiac anatomy and function. Reviews the testing methods done to diagnose heart disease and the outcomes of the test results. Describes the treatment choices: Medical Management, Angioplasty, or Coronary Bypass Surgery for treating various heart conditions including Myocardial Infarction, Angina, Valve Disease, and Cardiac Arrhythmias.  Written material given at graduation.   Medication Safety: - Group verbal and visual instruction to review commonly prescribed medications for heart and lung disease. Reviews the medication, class of the drug, and side effects. Includes the steps to properly store meds and maintain the prescription regimen.  Written material given at graduation.   Other: -Provides group and verbal instruction on various topics  (see comments)   Knowledge Questionnaire Score:    Core Components/Risk Factors/Patient Goals at Admission:  Personal Goals and Risk Factors at Admission - 12/06/23 1129       Core Components/Risk Factors/Patient Goals on Admission    Weight Management Yes;Obesity;Weight Loss    Intervention Weight Management: Develop a combined nutrition and exercise program designed to reach desired caloric intake, while maintaining appropriate intake of nutrient and fiber, sodium and fats, and appropriate energy expenditure required for the weight goal.;Obesity: Provide education and appropriate resources to help participant work on and attain dietary goals.;Weight Management/Obesity: Establish reasonable short term and long term weight goals.;Weight Management: Provide education and appropriate resources to help participant work on and attain dietary goals.    Admit Weight 357 lb 8 oz (162.2 kg)    Goal Weight: Short Term 350 lb (158.8 kg)    Goal Weight: Long Term 240 lb (108.9 kg)    Expected Outcomes Short Term: Continue to assess and modify interventions until short term weight is achieved;Long Term: Adherence to nutrition and physical activity/exercise program aimed toward attainment of established weight goal;Weight Loss: Understanding of general recommendations for a balanced deficit meal plan, which promotes 1-2 lb weight loss per week and includes a negative energy balance of [phone] kcal/d;Understanding recommendations for meals to include 15-35% energy as protein, 25-35% energy from fat, 35-60% energy from carbohydrates, less than 200mg  of dietary cholesterol, 20-35 gm of total fiber daily;Understanding of distribution of calorie intake throughout the day with the consumption of 4-5 meals/snacks    Tobacco Cessation Yes    Number of packs per day 1/4 a pack and continuing to wean down to 0    Intervention Assist the participant in steps to quit. Provide individualized education and counseling about  committing to Tobacco Cessation, relapse prevention, and pharmacological support that can be provided by physician.;Education officer, environmental, assist with locating and accessing local/national Quit Smoking programs, and support quit date choice.    Expected Outcomes Short Term: Will demonstrate readiness to quit, by selecting a quit date.;Short Term: Will quit all tobacco product use, adhering to prevention of relapse plan.;Long Term: Complete abstinence from all tobacco products for at least 12  months from quit date.    Heart Failure Yes    Intervention Provide a combined exercise and nutrition program that is supplemented with education, support and counseling about heart failure. Directed toward relieving symptoms such as shortness of breath, decreased exercise tolerance, and extremity edema.    Expected Outcomes Improve functional capacity of life;Short term: Attendance in program 2-3 days a week with increased exercise capacity. Reported lower sodium intake. Reported increased fruit and vegetable intake. Reports medication compliance.;Short term: Daily weights obtained and reported for increase. Utilizing diuretic protocols set by physician.;Long term: Adoption of self-care skills and reduction of barriers for early signs and symptoms recognition and intervention leading to self-care maintenance.    Hypertension Yes    Intervention Provide education on lifestyle modifcations including regular physical activity/exercise, weight management, moderate sodium restriction and increased consumption of fresh fruit, vegetables, and low fat dairy, alcohol moderation, and smoking cessation.;Monitor prescription use compliance.    Expected Outcomes Short Term: Continued assessment and intervention until BP is < 140/43mm HG in hypertensive participants. < 130/57mm HG in hypertensive participants with diabetes, heart failure or chronic kidney disease.;Long Term: Maintenance of blood pressure at goal levels.     Lipids Yes    Intervention Provide education and support for participant on nutrition & aerobic/resistive exercise along with prescribed medications to achieve LDL 70mg , HDL >40mg .    Expected Outcomes Short Term: Participant states understanding of desired cholesterol values and is compliant with medications prescribed. Participant is following exercise prescription and nutrition guidelines.;Long Term: Cholesterol controlled with medications as prescribed, with individualized exercise RX and with personalized nutrition plan. Value goals: LDL < 70mg , HDL > 40 mg.          Education:Diabetes - Individual verbal and written instruction to review signs/symptoms of diabetes, desired ranges of glucose level fasting, after meals and with exercise. Acknowledge that pre and post exercise glucose checks will be done for 3 sessions at entry of program.   Know Your Numbers and Heart Failure: - Group verbal and visual instruction to discuss disease risk factors for cardiac and pulmonary disease and treatment options.  Reviews associated critical values for Overweight/Obesity, Hypertension, Cholesterol, and Diabetes.  Discusses basics of heart failure: signs/symptoms and treatments.  Introduces Heart Failure Zone chart for action plan for heart failure.  Written material given at graduation. Flowsheet Row Cardiac Rehab from 01/13/2024 in [facility] Cardiac and Pulmonary Rehab  Date 12/09/23  Educator SB  Instruction Review Code 1- Verbalizes Understanding    Core Components/Risk Factors/Patient Goals Review:   Goals and Risk Factor Review     Row Name 01/11/24 [phone]             Core Components/Risk Factors/Patient Goals Review   Personal Goals Review Hypertension       Review Chipper does not check his blood pressure at home, but he wants to get a blood pressure cuff. Encuraged him to get one and bring into rehab to check its accuracy and walk through how to use it if he needs help.       Expected  Outcomes STG: Buy a BP cuff and check BP at home. LTG: manage risk factors independently          Core Components/Risk Factors/Patient Goals at Discharge (Final Review):   Goals and Risk Factor Review - 01/11/24 0949       Core Components/Risk Factors/Patient Goals Review   Personal Goals Review Hypertension    Review Kewon does not check his blood pressure at  home, but he wants to get a blood pressure cuff. Encuraged him to get one and bring into rehab to check its accuracy and walk through how to use it if he needs help.    Expected Outcomes STG: Buy a BP cuff and check BP at home. LTG: manage risk factors independently          ITP Comments:  ITP Comments     Row Name 11/25/23 1409 12/06/23 1117 12/07/23 1005 12/22/23 0825 01/19/24 0913   ITP Comments Virtual orientation call completed today. he has an appointment on Date: 12/06/2023   for EP eval and gym Orientation.  Documentation of diagnosis can be found in [facility] 11/09/2023 .  Marwan is a current tobacco user. Intervention for tobacco cessation was provided at the initial medical review. He was asked about readiness to quit and reported he is weaning down from a pack a day and is at 1/2 a pack a day at this time.  No quit date yet.  . Patient was advised and educated about tobacco cessation using combination therapy, tobacco cessation classes, quit line, and quit smoking apps. Patient demonstrated understanding of this material. Staff will continue to provide encouragement and follow up with the patient throughout the program. Completed and gym orientation for cardiac rehab. Initial ITP created and sent for review to Dr. Oneil Pinal, Medical Director. Berkley is a current tobacco user. Intervention for tobacco cessation was provided at the initial medical review. He was asked about readiness to quit and reported he is decreasing down his number from 1/2 a pack and is down to 5 cigarettes a day. Patient was advised and educated about  tobacco cessation using combination therapy, tobacco cessation classes, quit line, and quit smoking apps. Patient demonstrated understanding of this material. Staff will continue to provide encouragement and follow up with the patient throughout the program. First full day of exercise!  Patient was oriented to gym and equipment including functions, settings, policies, and procedures.  Patient's individual exercise prescription and treatment plan were reviewed.  All starting workloads were established based on the results of the 6 minute walk test done at initial orientation visit.  The plan for exercise progression was also introduced and progression will be customized based on patient's performance and goals. 30 Day review completed. Medical Director ITP review done, changes made as directed, and signed approval by Medical Director.    new to program 30 Day review completed. Medical Director ITP review done, changes made as directed, and signed approval by Medical Director.    Row Name 02/16/24 0752 03/07/24 0940         ITP Comments 30 Day review completed. Medical Director ITP review done, changes made as directed, and signed approval by Medical Director. Discharging patient at this time due to lack of attendance. Staff has reached out to patient with no response.He completed 12 of 36 sessions.         Comments: Early Discharge ITP

## 2024-03-09 ENCOUNTER — Encounter

## 2024-03-14 ENCOUNTER — Encounter

## 2024-03-16 ENCOUNTER — Encounter

## 2024-03-16 ENCOUNTER — Ambulatory Visit: Payer: Medicare Other

## 2024-03-16 DIAGNOSIS — I428 Other cardiomyopathies: Secondary | ICD-10-CM

## 2024-03-16 LAB — CUP PACEART REMOTE DEVICE CHECK
Battery Remaining Longevity: 152 mo
Battery Voltage: 3.03 V
Brady Statistic RV Percent Paced: 0.01 %
Date Time Interrogation Session: 20250730191338
HighPow Impedance: 105 Ohm
Implantable Lead Connection Status: 753985
Implantable Lead Implant Date: 20240501
Implantable Lead Location: 753860
Implantable Pulse Generator Implant Date: 20240501
Lead Channel Impedance Value: 304 Ohm
Lead Channel Impedance Value: 399 Ohm
Lead Channel Pacing Threshold Amplitude: 1 V
Lead Channel Pacing Threshold Pulse Width: 0.4 ms
Lead Channel Sensing Intrinsic Amplitude: 5 mV
Lead Channel Setting Pacing Amplitude: 2 V
Lead Channel Setting Pacing Pulse Width: 0.4 ms
Lead Channel Setting Sensing Sensitivity: 0.3 mV
Zone Setting Status: 755011

## 2024-03-20 ENCOUNTER — Ambulatory Visit: Payer: Self-pay | Admitting: Cardiology

## 2024-03-21 ENCOUNTER — Encounter

## 2024-03-23 ENCOUNTER — Encounter

## 2024-03-27 ENCOUNTER — Telehealth: Payer: Self-pay | Admitting: Cardiology

## 2024-03-27 DIAGNOSIS — M47816 Spondylosis without myelopathy or radiculopathy, lumbar region: Secondary | ICD-10-CM

## 2024-03-27 DIAGNOSIS — M48061 Spinal stenosis, lumbar region without neurogenic claudication: Secondary | ICD-10-CM

## 2024-03-27 DIAGNOSIS — M5416 Radiculopathy, lumbar region: Secondary | ICD-10-CM

## 2024-03-27 MED ORDER — SPIRONOLACTONE 25 MG PO TABS: 25.0000 mg | ORAL_TABLET | Freq: Every day | ORAL | 0 refills | Status: DC

## 2024-03-27 MED ORDER — DAPAGLIFLOZIN PROPANEDIOL 10 MG PO TABS: 10.0000 mg | ORAL_TABLET | Freq: Every day | ORAL | 0 refills | Status: DC

## 2024-03-27 NOTE — Progress Notes (Unsigned)
 Will send in medication. Pt needs appt. Was supposed to f/u in April with Sabharwal.

## 2024-03-28 ENCOUNTER — Encounter

## 2024-03-30 ENCOUNTER — Encounter

## 2024-03-31 ENCOUNTER — Other Ambulatory Visit: Payer: Self-pay | Admitting: Cardiology

## 2024-03-31 ENCOUNTER — Telehealth: Payer: Self-pay | Admitting: Family

## 2024-03-31 NOTE — Telephone Encounter (Signed)
 Called to confirm/remind patient of their appointment at [facility] on 04/03/24.   Appointment:   [x] Confirmed  [] Left mess   [] No answer/No voice mail  [] VM Full/unable to leave message  [] Phone not in service  Patient reminded to bring all medications and/or complete list.  Confirmed patient has transportation. Gave directions, instructed to utilize valet parking.

## 2024-04-03 ENCOUNTER — Ambulatory Visit (HOSPITAL_COMMUNITY): Payer: Self-pay | Admitting: Cardiology

## 2024-04-03 ENCOUNTER — Other Ambulatory Visit
Admission: RE | Admit: 2024-04-03 | Discharge: 2024-04-03 | Disposition: A | Discharge status: Home / Self Care | Source: Ambulatory Visit | Attending: Cardiology | Admitting: Cardiology

## 2024-04-03 ENCOUNTER — Other Ambulatory Visit: Payer: Self-pay | Admitting: Cardiology

## 2024-04-03 ENCOUNTER — Ambulatory Visit (HOSPITAL_BASED_OUTPATIENT_CLINIC_OR_DEPARTMENT_OTHER): Admitting: Cardiology

## 2024-04-03 VITALS — BP 103/66 | HR 88 | Wt 336.0 lb

## 2024-04-03 DIAGNOSIS — I5022 Chronic systolic (congestive) heart failure: Secondary | ICD-10-CM | POA: Insufficient documentation

## 2024-04-03 LAB — COMPREHENSIVE METABOLIC PANEL WITH GFR
ALT: 14 U/L (ref 0–44)
AST: 22 U/L (ref 15–41)
Albumin: 3.9 g/dL (ref 3.5–5.0)
Alkaline Phosphatase: 85 U/L (ref 38–126)
Anion gap: 11 (ref 5–15)
BUN: 18 mg/dL (ref 6–20)
CO2: 25 mmol/L (ref 22–32)
Calcium: 9.1 mg/dL (ref 8.9–10.3)
Chloride: 101 mmol/L (ref 98–111)
Creatinine, Ser: 1.48 mg/dL — ABNORMAL HIGH (ref 0.61–1.24)
GFR, Estimated: 58 mL/min — ABNORMAL LOW (ref >60)
Glucose, Bld: 89 mg/dL (ref 70–99)
Potassium: 3.8 mmol/L (ref 3.5–5.1)
Sodium: 137 mmol/L (ref 135–145)
Total Bilirubin: 0.5 mg/dL (ref 0.0–1.2)
Total Protein: 8 g/dL (ref 6.5–8.1)

## 2024-04-03 LAB — DIGOXIN LEVEL: Digoxin Level: <0.2 ng/mL — ABNORMAL LOW (ref 0.8–2.0)

## 2024-04-03 LAB — TSH: TSH: 1.569 u[IU]/mL (ref 0.350–4.500)

## 2024-04-03 LAB — BRAIN NATRIURETIC PEPTIDE: B Natriuretic Peptide: 47.1 pg/mL (ref 0.0–100.0)

## 2024-04-03 MED ORDER — AMIODARONE HCL 200 MG PO TABS: 200.0000 mg | ORAL_TABLET | Freq: Every day | ORAL | 11 refills | Status: AC

## 2024-04-03 MED ORDER — DIGOXIN 125 MCG PO TABS: 0.1250 mg | ORAL_TABLET | Freq: Every day | ORAL | 5 refills | Status: AC

## 2024-04-03 MED ORDER — DAPAGLIFLOZIN PROPANEDIOL 10 MG PO TABS: 10.0000 mg | ORAL_TABLET | Freq: Every day | ORAL | 5 refills | Status: AC

## 2024-04-03 MED ORDER — TORSEMIDE 20 MG PO TABS: ORAL_TABLET | ORAL | 5 refills | Status: AC

## 2024-04-03 MED ORDER — METOPROLOL SUCCINATE ER 50 MG PO TB24: 75.0000 mg | ORAL_TABLET | Freq: Every day | ORAL | 5 refills | Status: DC

## 2024-04-03 NOTE — Patient Instructions (Addendum)
 Medication Changes:  INCREASE METOPROLOL  TO 75 MG ONCE DAILY AT BEDTIME  INCREASE TORSEMIDE  TO 40 MG TWICE DAILY FOR 3 DAYS ONLY,  THEN, BACK TO 40 MG THE MORNING AND 20 MG IN THE EVENING   Lab Work:  Go over to the [facility]. Go pass the gift shop and have your blood work completed.  We will only call you if the results are abnormal or if the provider would like to make medication changes.  No news is good news.   Testing/Procedures:  Your physician has recommended that you have a cardiopulmonary stress test (CPX). CPX testing is a non-invasive measurement of heart and lung function. It replaces a traditional treadmill stress test. This type of test provides a tremendous amount of information that relates not only to your present condition but also for future outcomes. This test combines measurements of you ventilation, respiratory gas exchange in the lungs, electrocardiogram (EKG), blood pressure and physical response before, during, and following an exercise protocol.    Follow-Up in: 6 WEEKS WITH DR. GARDENIA  Our Doctors' schedules are NOT open yet for 6 weeks. We will place you on our recall list. Once they are available, we will call you to schedule your follow up appointment.    Thank you for choosing [facility].    At the [facility], you and your health needs are our priority. We have a designated team specialized in the treatment of Heart Failure. This Care Team includes your primary Heart Failure Specialized Cardiologist (physician), Advanced Practice Providers (APPs- Physician Assistants and Nurse Practitioners), and Pharmacist who all work together to provide you with the care you need, when you need it.   You may see any of the following providers on your designated Care Team at your next follow up:  Dr. Toribio Fuel Dr. Ezra Shuck Dr. Ria GARDENIA Dr. Morene Brownie Ellouise Class, FNP Jaun Bash, RPH-CPP  Please be sure to bring in all your medications bottles to every appointment.   Need to Contact Us :  If you have any questions or concerns before your next appointment please send us  a message through [place] or call our office at [phone].    TO LEAVE A MESSAGE FOR THE NURSE SELECT OPTION 2, PLEASE LEAVE A MESSAGE INCLUDING: YOUR NAME DATE OF BIRTH CALL BACK NUMBER REASON FOR CALL**this is important as we prioritize the call backs  YOU WILL RECEIVE A CALL BACK THE SAME DAY AS LONG AS YOU CALL BEFORE 4:00 PM

## 2024-04-04 ENCOUNTER — Encounter

## 2024-04-04 NOTE — Progress Notes (Signed)
 ADVANCED HEART FAILURE CLINIC NOTE  Referring Physician: Melvin Pao, NP  Primary Care: Melvin Pao, NP  Chief complaint: CHF  HPI: Marc [name] is a 48 y.o. male with HFrEF 2/2 NICM, OSA on CPAP, hx of tobacco use, morbid obesity presenting today to establish care. [name] reports first being diagnosed with systolic heart failure around 7984 at [facility]. He was diagnosed with the flu around that time followed by persistent SOB and diagnosis of nonischemic cardiomyopathy. 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 He was followed for several years at [facility] with LVEF ranging from 25-35%. He most recently presented to the [facility] ER in December 2023 with complaints of chest discomfort. At that time he was referred to HF clinic for further evaluation.  He had an echocardiogram in June 2023 demonstrating an LVEF of 25% and a repeat echo in 3/24 w/ persistently reduced function.  He is now s/p ICD implant in 12/2022. Briefly required admission afterwards due to volume overload.   Echo in 2/25 showed EF 20-25%, severe LV dilation, normal RV, normal IVC.   Patient reports dyspnea with heavy lifting. He has bilateral left pain with walking, says this is from nerve damage from prior injury.  No  dyspnea walking on flat ground, limited primarily by foot pain.  Tires easily.  Chronic 3 pillow orthopnea.  Occasional mild lightheadedness with standing, no syncope.  Rare palpitations.  Weight is down 28 lbs, he is on Mounjaro . He is using CPAP.   Medtronic ICD interrogation: No VT, impedance trending down but fluid index < threshold  Labs (7/25): K 4.2, creatinine 1.06, LDL 78  Current Outpatient Medications  Medication Sig Dispense Refill   amiodarone  (PACERONE ) 200 MG tablet Take 1 tablet (200 mg total) by mouth daily. 30 tablet 11   aspirin  EC 81 MG tablet Take 81 mg by mouth daily.     colchicine  0.6 MG tablet Take 1  tablet (0.6 mg total) by mouth 2 (two) times daily. 14 tablet 0   cyclobenzaprine  (FLEXERIL ) 10 MG tablet TAKE 1 TABLET BY MOUTH EVERY 8 HOURS AS NEEDED FOR MUSCLE SPASM 30 tablet 0   dapagliflozin  propanediol (FARXIGA ) 10 MG TABS tablet Take 1 tablet (10 mg total) by mouth daily before breakfast. 30 tablet 5   docusate sodium  (COLACE) 100 MG capsule Take 100 mg by mouth daily as needed for mild constipation.     hydrocortisone  (ANUSOL -HC) 2.5 % rectal cream Place 1 Application rectally 2 (two) times daily. (Patient taking differently: Place 1 Application rectally 2 (two) times daily as needed for anal itching or hemorrhoids.) 30 g 0   metoprolol  succinate (TOPROL  XL) 50 MG 24 hr tablet Take 1 tablet (50 mg total) by mouth at bedtime. 30 tablet 3   metoprolol  succinate (TOPROL -XL) 50 MG 24 hr tablet Take 1.5 tablets (75 mg total) by mouth daily. Take with or immediately following a meal. 45 tablet 5   potassium chloride  SA (KLOR-CON  M) 20 MEQ tablet Take 1 tablet (20 mEq total) by mouth daily. 30 tablet 3   rosuvastatin  (CRESTOR ) 5 MG tablet Take 1 tablet (5 mg total) by mouth daily. 90 tablet 1   sacubitril -valsartan  (ENTRESTO ) 49-51 MG Take 1 tablet by mouth 2 (two) times daily. 180 tablet 3   spironolactone  (ALDACTONE ) 25 MG tablet TAKE 1 TABLET BY MOUTH DAILY 100 tablet 0   tirzepatide  (MOUNJARO ) 12.5 MG/0.5ML Pen INJECT 12.5 MG SUBCUTANEOUSLY  ONCE A  WEEK 2 mL 11   torsemide  (DEMADEX ) 20 MG tablet 40 MG QAM and 20 MG QPM, Additional 20 MG QPM if needed for swelling and weight gain     torsemide  (DEMADEX ) 20 MG tablet Take 40 MG Twice daily for 3 days only. Then 40 MG in the morning and 20 MG in the afternoon. Additional 20 MG in the afternoon if needed for weight gain or swelling 90 tablet 5   triamcinolone  cream (KENALOG ) 0.1 % Apply 1 Application topically 2 (two) times daily. 30 g 0   umeclidinium-vilanterol (ANORO ELLIPTA ) 62.5-25 MCG/ACT AEPB Inhale 1 puff into the lungs daily. 1 each 1    albuterol  (VENTOLIN  HFA) 108 (90 Base) MCG/ACT inhaler Inhale 2 puffs into the lungs every 6 (six) hours as needed for wheezing or shortness of breath. (Patient not taking: Reported on 04/03/2024) 18 g 0   digoxin  (LANOXIN ) 0.125 MG tablet Take 1 tablet (0.125 mg total) by mouth daily. 30 tablet 5   omeprazole (PRILOSEC) 20 MG capsule Take 20 mg by mouth daily. (Patient not taking: Reported on 04/03/2024)     No current facility-administered medications for this visit.    PHYSICAL EXAM: Vitals:   04/03/24 1501  BP: 103/66  Pulse: 88  SpO2: 96%  Body mass index is 51.09 kg/m. General: NAD Neck: Thick, JVP difficult, no thyromegaly or thyroid  nodule.  Lungs: Clear to auscultation bilaterally with normal respiratory effort. CV: Nondisplaced PMI.  Heart regular S1/S2, no S3/S4, no murmur.  No peripheral edema.  No carotid bruit.  Normal pedal pulses.  Abdomen: Soft, nontender, no hepatosplenomegaly, no distention.  Skin: Intact without lesions or rashes.  Neurologic: Alert and oriented x 3.  Psych: Normal affect. Extremities: No clubbing or cyanosis.  HEENT: Normal.   DATA REVIEW  ECG: 08/12/22: sinus tachycardia as per my personal interpretation  ECHO: 2/25: EF 20-25%, severe LV dilation, normal RV, normal IVC.  11/04/22: LVEF 20-25% with global hypokinesis  as per my personal interpretation 10/10/18 ([facility]): LVEF 20-25%, RV with mildly reduced function.   Based on [facility] records he had a stress MRI in 2011 with an LVEF of 35%.   CATH: 09/19/20:  Severely depressed overall left ventricular function EF less than 25% Severely enlarged left ventricular chamber Left main large free of disease LAD was large and free of disease Circumflex was large and free of disease left dominant Right heart cath showed no evidence of pulmonary hypertension Mean PA was 23 mean wedge of 10 Cardiac output of 7.6 Fick  RHC 03/16/23:  HEMODYNAMICS: RA:                  9 mmHg (mean) PA:                   64/26 mmHg (42 mean) PCWP:            25 with large V waves                                      Estimated Fick CO/CI   5 L/min, 1.9 L/min/m2                                                 TPG  17  mmHg                                            PVR                 3.4 Wood Units  PAPi                4       IMPRESSION: Moderate to severely elevated pre and post capillary filling pressures.  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  Severely reduced cardiac output / index.   07/22/23:    FDG uptake findings are inconsistent with active myocardial inflammation/sarcoidosis.   FDG uptake was not observed. LV perfusion is abnormal. There is no evidence of ischemia. Defect 1: There is a medium defect with moderate reduction in uptake present in the apical to mid inferior location(s). FDG uptake was not present in the described defect segment(s). There is abnormal wall motion in the defect area. Consistent with artifact caused by subdiaphragmatic activity; Based on this study, cannot exclude small inferior infarct (less likely given history)   Left ventricular function is abnormal. Global function is severely reduced. EF: 22%. End diastolic cavity size is severely enlarged. End systolic cavity size is severely enlarged.  ASSESSMENT & PLAN:  1.  Chronic systolic CHF: Nonischemic cardiomyopathy, Medtronic ICD.  Cath in 2/22 with no significant CAD.   FDG PET in 12/24 not suggestive of active inflammation/active cardiac sarcoidosis.  Last echo in 2/25 with EF 20-25%, severe LV dilation, normal RV, normal IVC. He is on amiodarone  to suppress PVCs which may contribute to cardiomyopathy.  BMI remains quite high at 51, not candidate for advanced therapies at this point, would need ongoing weight loss.  NYHA class II-III, exam is difficult for volume (does not appear markedly overloaded) but thoracic impedance is starting to trend down on Optivol reading,  suggesting he may be mildly volume overloaded.  BP relatively low today.  - Continue digoxin  0.125, check level today.  - Continue Farxiga  10 mg daily.  - Continue Entresto  49/51 bid, would hold off on increase today with relatively low BP.  - Continue spironolactone  25 mg daily.   - Increase torsemide  to 40 mg bid x 3 days then back to 40 qam/20 qpm.  BMET/BNP today.  - Increase Toprol  XL to 75 mg at bedtime.  - I will arrange for CPX for objective measurement of functional capacity.  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  - He is taking amiodarone  400 mg daily, decrease to 200 mg daily.  Check LFTs and TSH, will need regular eye exam while on amiodarone .  3. Morbid obesity: Body mass index is 51.09 kg/m. Weight lower on Mounjaro .  - Continue Mounjaro .  4.  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  - Using anoro daily; reports that it is helping.  - Referred to pulmonary.  5. OSA: Continue to use CPAP.   I spent 32 minutes caring for this patient today including face to face time, ordering and reviewing labs, discussing weight loss/advanced therapies, seeing the patient, documenting in the record, and arranging follow ups.  Followup in 6 wks.   Ezra Shuck 04/04/2024

## 2024-04-06 ENCOUNTER — Encounter

## 2024-04-11 ENCOUNTER — Ambulatory Visit (HOSPITAL_COMMUNITY): Attending: Cardiology

## 2024-04-11 DIAGNOSIS — I5022 Chronic systolic (congestive) heart failure: Secondary | ICD-10-CM | POA: Diagnosis present

## 2024-04-14 DIAGNOSIS — I5022 Chronic systolic (congestive) heart failure: Secondary | ICD-10-CM | POA: Diagnosis not present

## 2024-04-18 ENCOUNTER — Ambulatory Visit (HOSPITAL_COMMUNITY): Payer: Self-pay | Admitting: Cardiology

## 2024-04-21 NOTE — Telephone Encounter (Addendum)
 Pt aware, agreeable, and verbalized understanding   ----- Message from Ezra Shuck sent at 04/18/2024  4:47 PM EDT ----- Moderate functional impairment from HF.  ----- Message ----- From: Veneda Damien BRAVO Sent: 04/14/2024  10:36 AM EDT To: Ezra GORMAN Shuck, MD

## 2024-04-25 ENCOUNTER — Other Ambulatory Visit: Payer: Self-pay | Admitting: Nurse Practitioner

## 2024-04-27 NOTE — Telephone Encounter (Signed)
 Requested Prescriptions  Pending Prescriptions Disp Refills   rosuvastatin  (CRESTOR ) 5 MG tablet [Pharmacy Med Name: Rosuvastatin  Calcium  5 MG Oral Tablet] 90 tablet 0    Sig: Take 1 tablet by mouth once daily     Cardiovascular:  Antilipid - Statins 2 Failed - 04/27/2024 10:08 AM      Failed - Cr in normal range and within 360 days    Creatinine, Ser  Date Value Ref Range Status  04/03/2024 1.48 (H) 0.61 - 1.24 mg/dL Final         Failed - Lipid Panel in normal range within the last 12 months    Cholesterol, Total  Date Value Ref Range Status  02/28/2024 149 100 - 199 mg/dL Final   LDL Chol Calc (NIH)  Date Value Ref Range Status  02/28/2024 78 0 - 99 mg/dL Final   HDL  Date Value Ref Range Status  02/28/2024 45 >39 mg/dL Final   Triglycerides  Date Value Ref Range Status  02/28/2024 147 0 - 149 mg/dL Final         Passed - Patient is not pregnant      Passed - Valid encounter within last 12 months    Recent Outpatient Visits           1 month ago Annual physical exam   [facility] Melvin Pao, NP   2 months ago Acute gout of left foot, unspecified cause   [facility] Melvin Pao, NP

## 2024-05-15 ENCOUNTER — Telehealth: Payer: Self-pay | Admitting: Cardiology

## 2024-05-17 NOTE — Progress Notes (Signed)
 Remote ICD Transmission

## 2024-06-15 ENCOUNTER — Ambulatory Visit: Payer: Self-pay | Admitting: Cardiology

## 2024-06-15 ENCOUNTER — Ambulatory Visit: Payer: Medicare Other

## 2024-06-15 DIAGNOSIS — I428 Other cardiomyopathies: Secondary | ICD-10-CM | POA: Diagnosis not present

## 2024-06-15 LAB — CUP PACEART REMOTE DEVICE CHECK
Battery Remaining Longevity: 150 mo
Battery Voltage: 3.02 V
Brady Statistic RV Percent Paced: 0.01 %
Date Time Interrogation Session: 20251029211505
HighPow Impedance: 94 Ohm
Implantable Lead Connection Status: 753985
Implantable Lead Implant Date: 20240501
Implantable Lead Location: 753860
Implantable Pulse Generator Implant Date: 20240501
Lead Channel Impedance Value: 285 Ohm
Lead Channel Impedance Value: 342 Ohm
Lead Channel Pacing Threshold Amplitude: 1.25 V
Lead Channel Pacing Threshold Pulse Width: 0.4 ms
Lead Channel Sensing Intrinsic Amplitude: 4.4 mV
Lead Channel Setting Pacing Amplitude: 2 V
Lead Channel Setting Pacing Pulse Width: 0.4 ms
Lead Channel Setting Sensing Sensitivity: 0.3 mV
Zone Setting Status: 755011

## 2024-06-21 NOTE — Progress Notes (Signed)
 Remote ICD Transmission

## 2024-07-03 ENCOUNTER — Other Ambulatory Visit (HOSPITAL_COMMUNITY): Payer: Self-pay

## 2024-07-10 ENCOUNTER — Other Ambulatory Visit: Payer: Self-pay

## 2024-07-10 MED ORDER — SPIRONOLACTONE 25 MG PO TABS: 25.0000 mg | ORAL_TABLET | Freq: Every day | ORAL | 0 refills | Status: AC

## 2024-08-15 ENCOUNTER — Ambulatory Visit: Payer: Self-pay

## 2024-08-15 ENCOUNTER — Telehealth: Payer: Self-pay | Admitting: Family

## 2024-08-15 NOTE — Telephone Encounter (Signed)
" °  FYI Only or Action Required?: FYI only for provider: appointment scheduled on 1/2 @ [address], only want appt after the 1st due to financials.  Patient was last seen in primary care on 02/28/2024 by Melvin Pao, NP.  Called Nurse Triage reporting Shortness of Breath.  Symptoms began today.  Interventions attempted: Prescription medications: torsemide , aldactone .  Symptoms are: rapidly improving.  Triage Disposition: See Physician Within 24 Hours  Patient/caregiver understands and will follow disposition?: Yes, but will wait  Copied from CRM #[id number]. Topic: Clinical - Red Word Triage >> Aug 15, 2024 10:27 AM Selinda RAMAN wrote: Red Word that prompted transfer to Nurse Triage: The patient called in stating he has excess fluid on his lungs. He did take his Torsemide  to try and pee it out. He states he has also been dealing with bad dizziness, chest and  back pain and shortness of breath. His wife also told him that he told her hew fell a few weeks back and hit his head but he does not remember that at all. I will transfer him to [facility] Reason for Disposition  [1] MILD difficulty breathing (e.g., minimal/no SOB at rest, SOB with walking, pulse < 100) AND [2] new-onset or worse than normal (i.e., patient's baseline)  Answer Assessment - Initial Assessment Questions 1. MAIN CONCERN OR SYMPTOM: What is your main concern right now? What's the main symptom you're worried about? (e.g., breathing difficulty, ankle swelling, weight gain).     SOB 2. ONSET: When did the  SOB  start?     This am 3. BREATHING DIFFICULTY: Are you having any difficulty breathing? If Yes, ask: How bad is it?  (e.g., none, mild, moderate, severe)  Is this worse than usual for you?     SOB resolved after taking fluid pills 4. EDEMA - FOOT-LEG SWELLING: Do you have swelling of your ankles, feet or legs? If Yes, ask: How bad is the swelling? (e.g., localized; mild, moderate, severe)       8.  OTHER SYMPTOMS: Do you have any other symptoms? (e.g., depression, weakness or fatigue, abdomen bloating, hacky cough)     Mild fatigue 9. DIURETICS: Are you currently taking water pills? (e.g., furosemide  [Lasix ], hydrochlorothiazide [HCTZ], bumetanide [Bumex], metolazone [Zaroxolyn]) If Yes, ask: What medicine are you taking, and how often?  Any recent change in dose?      Torsemide , aldactone  10. O2 SATURATION MONITOR: Do you use an oxygen saturation monitor (pulse oximeter) at home? If Yes, ask: What is your reading (oxygen level) today? What is your usual oxygen saturation reading? (e.g., 95%)       no 11. HEART FAILURE HCP: Who treats your heart failure?  (e.g., cardiologist or heart specialist, heart failure clinic or center, primary care doctor)       cardiology  Protocols used: Heart Failure on Treatment Follow-up Call-A-AH   Triage Disposition: See Physician Within 24 Hours  Patient/caregiver understands and will follow disposition?:  "

## 2024-08-18 ENCOUNTER — Ambulatory Visit

## 2024-08-30 ENCOUNTER — Telehealth: Payer: Self-pay

## 2024-08-30 NOTE — Telephone Encounter (Signed)
 Call x1; spoke w/ patient to schedule overdue follow up - patient canceled his 12/2022 f/u w/ Cindie s/p ICD Implant, patient was never rescheduled. patient states he can't schedule now d/t finances, stating a lot of times cardiology appts are $100/200 and he can't afford that at the moment. advised the patient I will follow up with him beginning of February if I haven't heard anything from him. He states this will be ok.

## 2024-08-31 ENCOUNTER — Telehealth (HOSPITAL_COMMUNITY): Payer: Self-pay | Admitting: Licensed Clinical Social Worker

## 2024-08-31 ENCOUNTER — Encounter: Payer: Self-pay | Admitting: Nurse Practitioner

## 2024-08-31 ENCOUNTER — Ambulatory Visit (INDEPENDENT_AMBULATORY_CARE_PROVIDER_SITE_OTHER): Admitting: Nurse Practitioner

## 2024-08-31 DIAGNOSIS — M5416 Radiculopathy, lumbar region: Secondary | ICD-10-CM | POA: Diagnosis not present

## 2024-08-31 DIAGNOSIS — M47816 Spondylosis without myelopathy or radiculopathy, lumbar region: Secondary | ICD-10-CM

## 2024-08-31 DIAGNOSIS — R7303 Prediabetes: Secondary | ICD-10-CM

## 2024-08-31 DIAGNOSIS — I509 Heart failure, unspecified: Secondary | ICD-10-CM

## 2024-08-31 DIAGNOSIS — I10 Essential (primary) hypertension: Secondary | ICD-10-CM | POA: Diagnosis not present

## 2024-08-31 DIAGNOSIS — Z23 Encounter for immunization: Secondary | ICD-10-CM

## 2024-08-31 DIAGNOSIS — M48061 Spinal stenosis, lumbar region without neurogenic claudication: Secondary | ICD-10-CM | POA: Diagnosis not present

## 2024-08-31 DIAGNOSIS — I700 Atherosclerosis of aorta: Secondary | ICD-10-CM

## 2024-08-31 DIAGNOSIS — G4733 Obstructive sleep apnea (adult) (pediatric): Secondary | ICD-10-CM

## 2024-08-31 LAB — MICROALBUMIN, URINE WAIVED
Creatinine, Urine Waived: 200 mg/dL (ref 10–300)
Microalb, Ur Waived: 150 mg/L — ABNORMAL HIGH (ref 0–19)

## 2024-08-31 MED ORDER — ALBUTEROL SULFATE HFA 108 (90 BASE) MCG/ACT IN AERS: 2.0000 | INHALATION_SPRAY | Freq: Four times a day (QID) | RESPIRATORY_TRACT | 0 refills | Status: AC | PRN

## 2024-08-31 MED ORDER — METOPROLOL SUCCINATE ER 50 MG PO TB24: 75.0000 mg | ORAL_TABLET | Freq: Every day | ORAL | 5 refills | Status: AC

## 2024-08-31 MED ORDER — UMECLIDINIUM-VILANTEROL 62.5-25 MCG/ACT IN AEPB: 1.0000 | INHALATION_SPRAY | Freq: Every day | RESPIRATORY_TRACT | 1 refills | Status: AC

## 2024-08-31 MED ORDER — CYCLOBENZAPRINE HCL 10 MG PO TABS: 10.0000 mg | ORAL_TABLET | Freq: Three times a day (TID) | ORAL | 0 refills | Status: AC | PRN

## 2024-08-31 NOTE — Assessment & Plan Note (Signed)
 Chronic.  Controlled.  Continue with current medication regimen.  Labs ordered today.  Return to clinic in 6 months for reevaluation.  Call sooner if concerns arise.  ? ?

## 2024-08-31 NOTE — Assessment & Plan Note (Addendum)
 Chronic. Doing well with Mounjaro .  Has lost 35lbs in the last 12 months.  Praised for weight loss.  Encouraged patient to continue.

## 2024-08-31 NOTE — Telephone Encounter (Signed)
 CSW consulted to speak with pt regarding copay concerns for MD visits- CSW called pt but unable to reach- left VM requesting return call  Andriette HILARIO Leech, LCSW Clinical Social Worker [facility] Desk#: [phone] Cell#: [phone]

## 2024-08-31 NOTE — Assessment & Plan Note (Signed)
 Chronic. Followed by Cardiology.  Encouraged patient to weight daily.  Keep follow up with HF clinic.  Has lost 34lbs in the last 12 months.  - Reminded to call for an overnight weight gain of >2 pounds or a weekly weight gain of >5 pounds -On Entresto , Farxiga , Torsemide . - not adding salt to food and read food labels. Reviewed the importance of keeping daily sodium intake to 2000mg  daily. - Avoid Ibuprofen products.

## 2024-08-31 NOTE — Assessment & Plan Note (Signed)
Chronic.  Controlled.  Continue with current medication regimen of Entresto, Carvdilol and Spirnolactone.  Followed by HF clinic. Labs ordered today.  Return to clinic in 6 months for reevaluation.  Call sooner if concerns arise.

## 2024-08-31 NOTE — Progress Notes (Signed)
 "  BP 136/88 (BP Location: Right Wrist, Cuff Size: Normal)   Pulse 89   Temp 98.1 F (36.7 C) (Oral)   Ht 5' 7.99 (1.727 m)   Wt (!) 330 lb 6.4 oz (149.9 kg)   SpO2 96%   BMI 50.25 kg/m    Subjective:    Patient ID: Marc [name], male    DOB: [birth year], 49 y.o.   MRN: [id number]  HPI: Marc [name] is a 49 y.o. male  Chief Complaint  Patient presents with   office visit    6 month F/u. Patient stated he was really sick for like a week or longer. His wife stated he told her he fell and patient doesn't remember that, he had a pain down his neck to his chest, and he has a monitor on now. He had chest pain, dizziness, lightheaded, and palpitations (slightly), pain down his neck.     HYPERTENSION/HEART FAILURE Not weighing himself daily. He is sleeping with 3 pillows at night.   No swelling in lower extremities. On Farxiga  and Entresto . Down 34lbs since last visit. Has an appt with Cardiology next week. Hypertension status: controlled  Satisfied with current treatment? no Duration of hypertension: years BP monitoring frequency:  not checking BP range:  BP medication side effects:  no Medication compliance: excellent compliance Previous BP meds:and Entresto , carvedilol , and spironalactone Aspirin : yes Recurrent headaches: no Visual changes: no Palpitations: no Dyspnea: yes Chest pain: no Lower extremity edema: no Dizzy/lightheaded: no  Patient state had a recent fall.  He was having back pain, dizziness, lightheadedness, and chest pain.  Symptoms have resolved now.    SLEEP APNEA Sleep apnea status: controlled Duration: months Satisfied with current treatment?:  yes CPAP use:  yes Sleep quality with CPAP use: excellent Treament compliance:excellent compliance Last sleep study:  Treatments attempted:  Wakes feeling refreshed:  yes Daytime hypersomnolence:  no Fatigue:  no Insomnia:  no Good sleep hygiene:  no Difficulty falling asleep:  yes Difficulty  staying asleep:  no Snoring bothers bed partner:  no Observed apnea by bed partner: no Obesity:  yes Hypertension: yes  Pulmonary hypertension:  no Coronary artery disease:  no    Relevant past medical, surgical, family and social history reviewed and updated as indicated. Interim medical history since our last visit reviewed. Allergies and medications reviewed and updated.  Review of Systems  Eyes:  Negative for visual disturbance.  Respiratory:  Positive for shortness of breath. Negative for chest tightness.   Cardiovascular:  Negative for chest pain, palpitations and leg swelling.       Sleeps with three pillows at night  Skin:  Positive for rash.  Neurological:  Negative for dizziness, light-headedness and headaches.    Per HPI unless specifically indicated above     Objective:    BP 136/88 (BP Location: Right Wrist, Cuff Size: Normal)   Pulse 89   Temp 98.1 F (36.7 C) (Oral)   Ht 5' 7.99 (1.727 m)   Wt (!) 330 lb 6.4 oz (149.9 kg)   SpO2 96%   BMI 50.25 kg/m   Wt Readings from Last 3 Encounters:  08/31/24 (!) 330 lb 6.4 oz (149.9 kg)  04/03/24 (!) 336 lb (152.4 kg)  02/28/24 (!) 337 lb 12.8 oz (153.2 kg)    Physical Exam Vitals and nursing note reviewed.  Constitutional:      General: He is not in acute distress.    Appearance: Normal appearance. He is obese. He is not ill-appearing,  toxic-appearing or diaphoretic.  HENT:     Head: Normocephalic.     Right Ear: External ear normal.     Left Ear: External ear normal.     Nose: Nose normal. No congestion or rhinorrhea.     Mouth/Throat:     Mouth: Mucous membranes are moist.  Eyes:     General:        Right eye: No discharge.        Left eye: No discharge.     Extraocular Movements: Extraocular movements intact.     Conjunctiva/sclera: Conjunctivae normal.     Pupils: Pupils are equal, round, and reactive to light.  Cardiovascular:     Rate and Rhythm: Normal rate and regular rhythm.     Heart  sounds: No murmur heard. Pulmonary:     Effort: Pulmonary effort is normal. No respiratory distress.     Breath sounds: Normal breath sounds. No wheezing, rhonchi or rales.  Abdominal:     General: Abdomen is flat. Bowel sounds are normal.  Musculoskeletal:     Cervical back: Normal range of motion and neck supple.     Right lower leg: No edema.     Left lower leg: No edema.  Skin:    General: Skin is warm and dry.     Capillary Refill: Capillary refill takes less than 2 seconds.      Neurological:     General: No focal deficit present.     Mental Status: He is alert and oriented to person, place, and time.  Psychiatric:        Mood and Affect: Mood normal.        Behavior: Behavior normal.        Thought Content: Thought content normal.        Judgment: Judgment normal.     Results for orders placed or performed in visit on 06/15/24  CUP PACEART REMOTE DEVICE CHECK   Collection Time: 06/14/24  9:15 PM  Result Value Ref Range   Date Time Interrogation Session [phone]    Pulse Generator Manufacturer MERM    Pulse Gen Model [id number] Cobalt XT VR MRI    Pulse Gen Serial Number [id number]    Clinic Name [facility]    Implantable Pulse Generator Type Implantable Cardiac Defibulator    Implantable Pulse Generator Implant Date [id number]    Implantable Lead Manufacturer [facility]    Implantable Lead Model [id number] Sprint Quattro Secure S MRI SureScan    Implantable Lead Serial Number [id number]    Implantable Lead Implant Date 79759498    Implantable Lead Location Detail 1 SEPTUM    Implantable Lead Location [id number]    Implantable Lead Connection Status [id number]    Lead Channel Setting Sensing Sensitivity 0.3 mV   Lead Channel Setting Pacing Pulse Width 0.4 ms   Lead Channel Setting Pacing Amplitude 2.0 V   Zone Setting Status Active    Zone Setting Status Inactive    Zone Setting Status Inactive    Zone Setting Status [phone]    Lead Channel Impedance Value 285 ohm   Lead  Channel Impedance Value 342 ohm   Lead Channel Sensing Intrinsic Amplitude 4.4 mV   Lead Channel Pacing Threshold Amplitude 1.25 V   Lead Channel Pacing Threshold Pulse Width 0.4 ms   HighPow Impedance 94 ohm   Battery Remaining Longevity 150 mo   Battery Voltage 3.02 V   Brady Statistic RV Percent Paced 0.01 %      Assessment &  Plan:   Problem List Items Addressed This Visit       Cardiovascular and Mediastinum   Congestive heart failure (HCC)   Chronic. Followed by Cardiology.  Encouraged patient to weight daily.  Keep follow up with HF clinic.  Has lost 34lbs in the last 12 months.  - Reminded to call for an overnight weight gain of >2 pounds or a weekly weight gain of >5 pounds -On Entresto , Farxiga , Torsemide . - not adding salt to food and read food labels. Reviewed the importance of keeping daily sodium intake to 2000mg  daily. - Avoid Ibuprofen products.      Relevant Medications   metoprolol  succinate (TOPROL -XL) 50 MG 24 hr tablet   Hypertension   Chronic.  Controlled.  Continue with current medication regimen of Entresto , Carvdilol and Spirnolactone.  Followed by HF clinic. Labs ordered today.  Return to clinic in 6 months for reevaluation.  Call sooner if concerns arise.        Relevant Medications   metoprolol  succinate (TOPROL -XL) 50 MG 24 hr tablet   Other Relevant Orders   Lipid panel   Microalbumin, Urine Waived   Aortic atherosclerosis   Chronic.  Controlled.  Continue with current medication regimen of ASA. Labs ordered today.  Return to clinic in 6 months for reevaluation.  Call sooner if concerns arise.       Relevant Medications   metoprolol  succinate (TOPROL -XL) 50 MG 24 hr tablet     Respiratory   OSA on CPAP   Chronic.  Controlled.  Continue with current medication regimen.  Labs ordered today.  Return to clinic in 6 months for reevaluation.  Call sooner if concerns arise.          Other   Morbid obesity (HCC) - Primary   Chronic. Doing well  with Mounjaro .  Has lost 35lbs in the last 12 months.  Praised for weight loss.  Encouraged patient to continue.      Prediabetes   Chronic.  Controlled.  Continue with current medication regimen.  Labs ordered today.  Return to clinic in 6 months for reevaluation.  Call sooner if concerns arise.        Relevant Orders   Comprehensive metabolic panel with GFR   Hemoglobin A1c   Other Visit Diagnoses       Lumbar radiculopathy       Relevant Medications   cyclobenzaprine  (FLEXERIL ) 10 MG tablet     Spinal stenosis of lumbar region, unspecified whether neurogenic claudication present       Relevant Medications   cyclobenzaprine  (FLEXERIL ) 10 MG tablet     Lumbar spondylosis       Relevant Medications   cyclobenzaprine  (FLEXERIL ) 10 MG tablet     Lumbar foraminal stenosis       Relevant Medications   cyclobenzaprine  (FLEXERIL ) 10 MG tablet     Need for COVID-19 vaccine       Relevant Orders   Pfizer Comirnaty Covid-19 Vaccine 58yrs & older (Completed)     Flu vaccine need       Relevant Orders   Flu vaccine trivalent PF, 6mos and older(Flulaval,Afluria,Fluarix,Fluzone) (Completed)        Follow up plan: No follow-ups on file.       "

## 2024-08-31 NOTE — Assessment & Plan Note (Signed)
Chronic.  Controlled.  Continue with current medication regimen of ASA. Labs ordered today.  Return to clinic in 6 months for reevaluation.  Call sooner if concerns arise.   

## 2024-09-01 ENCOUNTER — Ambulatory Visit: Payer: Self-pay | Admitting: Nurse Practitioner

## 2024-09-01 ENCOUNTER — Telehealth: Payer: Self-pay | Admitting: Internal Medicine

## 2024-09-01 LAB — COMPREHENSIVE METABOLIC PANEL WITH GFR
ALT: 17 IU/L (ref 0–44)
AST: 26 IU/L (ref 0–40)
Albumin: 4.5 g/dL (ref 4.1–5.1)
Alkaline Phosphatase: 125 IU/L — ABNORMAL HIGH (ref 47–123)
BUN/Creatinine Ratio: 13 (ref 9–20)
BUN: 18 mg/dL (ref 6–24)
Bilirubin Total: 0.4 mg/dL (ref 0.0–1.2)
CO2: 22 mmol/L (ref 20–29)
Calcium: 10 mg/dL (ref 8.7–10.2)
Chloride: 96 mmol/L (ref 96–106)
Creatinine, Ser: 1.39 mg/dL — ABNORMAL HIGH (ref 0.76–1.27)
Globulin, Total: 3.7 g/dL (ref 1.5–4.5)
Glucose: 88 mg/dL (ref 70–99)
Potassium: 4.4 mmol/L (ref 3.5–5.2)
Sodium: 137 mmol/L (ref 134–144)
Total Protein: 8.2 g/dL (ref 6.0–8.5)
eGFR: 62 mL/min/1.73

## 2024-09-01 LAB — LIPID PANEL
Chol/HDL Ratio: 3.6 ratio (ref 0.0–5.0)
Cholesterol, Total: 172 mg/dL (ref 100–199)
HDL: 48 mg/dL
LDL Chol Calc (NIH): 102 mg/dL — ABNORMAL HIGH (ref 0–99)
Triglycerides: 122 mg/dL (ref 0–149)
VLDL Cholesterol Cal: 22 mg/dL (ref 5–40)

## 2024-09-01 LAB — HEMOGLOBIN A1C
Est. average glucose Bld gHb Est-mCnc: 111 mg/dL
Hgb A1c MFr Bld: 5.5 % (ref 4.8–5.6)

## 2024-09-01 NOTE — Telephone Encounter (Signed)
 Called to confirm/remind patient of their appointment at the [facility] on 09/04/24.   Appointment:   [x] Confirmed  [] Left mess   [] No answer/No voice mail  [] VM Full/unable to leave message  [] Phone not in service  Patient reminded to bring all medications and/or complete list.  Confirmed patient has transportation. Gave directions, instructed to utilize valet parking.

## 2024-09-04 ENCOUNTER — Telehealth: Payer: Self-pay

## 2024-09-04 ENCOUNTER — Ambulatory Visit: Attending: Internal Medicine | Admitting: Internal Medicine

## 2024-09-04 ENCOUNTER — Other Ambulatory Visit (HOSPITAL_COMMUNITY): Payer: Self-pay

## 2024-09-04 VITALS — BP 113/68 | HR 67 | Wt 332.4 lb

## 2024-09-04 DIAGNOSIS — G4733 Obstructive sleep apnea (adult) (pediatric): Secondary | ICD-10-CM | POA: Insufficient documentation

## 2024-09-04 DIAGNOSIS — I428 Other cardiomyopathies: Secondary | ICD-10-CM | POA: Diagnosis not present

## 2024-09-04 DIAGNOSIS — Z9989 Dependence on other enabling machines and devices: Secondary | ICD-10-CM

## 2024-09-04 DIAGNOSIS — N1831 Chronic kidney disease, stage 3a: Secondary | ICD-10-CM | POA: Diagnosis not present

## 2024-09-04 DIAGNOSIS — Z79899 Other long term (current) drug therapy: Secondary | ICD-10-CM | POA: Diagnosis not present

## 2024-09-04 DIAGNOSIS — Z7984 Long term (current) use of oral hypoglycemic drugs: Secondary | ICD-10-CM | POA: Insufficient documentation

## 2024-09-04 DIAGNOSIS — J449 Chronic obstructive pulmonary disease, unspecified: Secondary | ICD-10-CM | POA: Insufficient documentation

## 2024-09-04 DIAGNOSIS — Z87891 Personal history of nicotine dependence: Secondary | ICD-10-CM | POA: Diagnosis not present

## 2024-09-04 DIAGNOSIS — Z95810 Presence of automatic (implantable) cardiac defibrillator: Secondary | ICD-10-CM | POA: Diagnosis not present

## 2024-09-04 DIAGNOSIS — I5022 Chronic systolic (congestive) heart failure: Secondary | ICD-10-CM | POA: Insufficient documentation

## 2024-09-04 DIAGNOSIS — I493 Ventricular premature depolarization: Secondary | ICD-10-CM | POA: Diagnosis not present

## 2024-09-04 DIAGNOSIS — Z6841 Body Mass Index (BMI) 40.0 and over, adult: Secondary | ICD-10-CM | POA: Diagnosis not present

## 2024-09-04 MED ORDER — SACUBITRIL-VALSARTAN 97-103 MG PO TABS: 1.0000 | ORAL_TABLET | Freq: Two times a day (BID) | ORAL | 6 refills | Status: AC

## 2024-09-04 NOTE — Telephone Encounter (Signed)
 Advanced [facility] Failure Patient Advocate Encounter  The patient was approved for a [employer] grant that will help cover the cost of Digoxin , Entresto , Farxiga , Metoprolol , Spironolactone .  Total amount awarded, $7,500.  Effective: 08/05/2024 - 08/04/2025.  BIN [id number] PCN [id number] [id number] ID [id number]  Pharmacy provided with approval and processing information. Patient informed while in office.  Rachel DEL, CPhT Rx Patient Advocate Phone: [phone]

## 2024-09-04 NOTE — Patient Instructions (Signed)
 Medication Changes:  INCREASE Entresto  to 97/103 mg Twice daily   Testing/Procedures:  Your physician has requested that you have an echocardiogram. Echocardiography is a painless test that uses sound waves to create images of your heart. It provides your doctor with information about the size and shape of your heart and how well your hearts chambers and valves are working. This procedure takes approximately one hour. There are no restrictions for this procedure. Please do NOT wear cologne, perfume, aftershave, or lotions (deodorant is allowed). Please arrive 15 minutes prior to your appointment time. IN 4 MONTHS, YOU WILL BE CALLED TO SCHEDULE THIS  Please note: We ask at that you not bring children with you during ultrasound (echo/ vascular) testing. Due to room size and safety concerns, children are not allowed in the ultrasound rooms during exams. Our front office staff cannot provide observation of children in our lobby area while testing is being conducted. An adult accompanying a patient to their appointment will only be allowed in the ultrasound room at the discretion of the ultrasound technician under special circumstances. We apologize for any inconvenience.   Special Instructions // Education:  Do the following things EVERYDAY: Weigh yourself in the morning before breakfast. Write it down and keep it in a log. Take your medicines as prescribed Eat low salt foods--Limit salt (sodium) to 2000 mg per day.  Stay as active as you can everyday Limit all fluids for the day to less than 2 liters   Follow-Up in: 4 months    If you have any questions or concerns before your next appointment please send us  a message through [place] or call our office at [phone], If it is after office hours your call will be answered by our answering service and directed appropriately.     At the [facility], you and your health needs are our priority. We have a designated team  specialized in the treatment of Heart Failure. This Care Team includes your primary Heart Failure Specialized Cardiologist (physician), Advanced Practice Providers (APPs- Physician Assistants and Nurse Practitioners), and Pharmacist who all work together to provide you with the care you need, when you need it.   You may see any of the following providers on your designated Care Team at your next follow up:  Dr. Toribio Fuel Dr. Ezra Shuck Dr. Ria Commander Dr. Odis Brownie Greig Mosses, NP Caffie Shed, [place] [address] [place], [place] Beckey Coe, NP Jordan Lee, NP Ellouise Class, NP Jaun Bash, PharmD

## 2024-09-04 NOTE — Progress Notes (Signed)
 "  ADV[facility]ED [facility] FAILURE CLINIC NOTE  Referring Physician: Melvin Pao, NP  Primary Care: Melvin Pao, NP  Chief complaint: CHF  HPI: Marc [name] is a 49 y.o. male with HFrEF 2/2 NICM, OSA on CPAP, hx of tobacco use, morbid obesity presenting today to establish care. Marc [name] reports first being diagnosed with systolic heart failure around 7984 at [facility]. He was diagnosed with the flu around that time followed by persistent SOB and diagnosis of nonischemic cardiomyopathy. 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 He was followed for several years at [facility] with LVEF ranging from 25-35%. He most recently presented to the [facility] ER in December 2023 with complaints of chest discomfort. At that time he was referred to HF clinic for further evaluation.  He had an echocardiogram in June 2023 demonstrating an LVEF of 25% and a repeat echo in 3/24 w/ persistently reduced function.  He is now s/p ICD implant in 12/2022. Briefly required admission afterwards due to volume overload.   Echo in 2/25 showed EF 20-25%, severe LV dilation, normal RV, normal IVC.   Here for f/u with his wife. Remains on Mounjaro . Says he feels pretty good. Not exercising like he used. Breathing ok but gets short at time. Easily fatigued. Compliant with meds.   Medtronic ICD interrogation: Optivol was up now down. Activity level 2.7hr/day No VT/AF. Personally reviewed   Current Outpatient Medications  Medication Sig Dispense Refill   amiodarone  (PACERONE ) 200 MG tablet Take 1 tablet (200 mg total) by mouth daily. 30 tablet 11   aspirin  EC 81 MG tablet Take 81 mg by mouth daily.     colchicine  0.6 MG tablet Take 1 tablet (0.6 mg total) by mouth 2 (two) times daily. 14 tablet 0   dapagliflozin  propanediol (FARXIGA ) 10 MG TABS tablet Take 1 tablet (10 mg total) by mouth daily before breakfast. 30 tablet 5   digoxin  (LANOXIN ) 0.125 MG tablet Take 1  tablet (0.125 mg total) by mouth daily. 30 tablet 5   docusate sodium  (COLACE) 100 MG capsule Take 100 mg by mouth daily as needed for mild constipation.     hydrocortisone  (ANUSOL -HC) 2.5 % rectal cream Place 1 Application rectally 2 (two) times daily. (Patient taking differently: Place 1 Application rectally 2 (two) times daily as needed for anal itching or hemorrhoids.) 30 g 0   metoprolol  succinate (TOPROL -XL) 50 MG 24 hr tablet Take 1.5 tablets (75 mg total) by mouth daily. Take with or immediately following a meal. 45 tablet 5   rosuvastatin  (CRESTOR ) 5 MG tablet Take 1 tablet by mouth once daily 90 tablet 0   sacubitril -valsartan  (ENTRESTO ) 49-51 MG Take 1 tablet by mouth 2 (two) times daily. 180 tablet 3   spironolactone  (ALDACTONE ) 25 MG tablet Take 1 tablet (25 mg total) by mouth daily. 100 tablet 0   tirzepatide  (MOUNJARO ) 12.5 MG/0.5ML Pen INJECT 12.5 MG SUBCUTANEOUSLY  ONCE A WEEK 2 mL 11   torsemide  (DEMADEX ) 20 MG tablet Take 40 MG Twice daily for 3 days only. Then 40 MG in the morning and 20 MG in the afternoon. Additional 20 MG in the afternoon if needed for weight gain or swelling (Patient taking differently: Take 40 mg by mouth daily. Additional 40 MG in the afternoon if needed for weight gain or swelling) 90 tablet 5   albuterol  (VENTOLIN  HFA) 108 (90 Base) MCG/ACT inhaler Inhale 2 puffs into the lungs every 6 (six) hours as  needed for wheezing or shortness of breath. 18 g 0   cyclobenzaprine  (FLEXERIL ) 10 MG tablet Take 1 tablet (10 mg total) by mouth 3 (three) times daily as needed for muscle spasms. 30 tablet 0   omeprazole (PRILOSEC) 20 MG capsule Take 20 mg by mouth daily. (Patient not taking: Reported on 08/31/2024)     triamcinolone  cream (KENALOG ) 0.1 % Apply 1 Application topically 2 (two) times daily. (Patient not taking: Reported on 08/31/2024) 30 g 0   umeclidinium-vilanterol (ANORO ELLIPTA ) 62.5-25 MCG/ACT AEPB Inhale 1 puff into the lungs daily. 1 each 1   No current  facility-administered medications for this visit.    PHYSICAL EXAM: Vitals:   09/04/24 1516  BP: 113/68  Pulse: 67  SpO2: 96%  Body mass index is 50.55 kg/m.  Last Weight  Most recent update: 09/04/2024  3:16 PM    Weight  150.8 kg (332 lb 6.4 oz)               General:  Sitting up. No resp difficulty HEENT: normal Neck: supple. no JVD.  Cor: Regular rate & rhythm. No rubs, gallops or murmurs. Lungs: clear Abdomen: markedly obese  soft, nontender, nondistended.Good bowel sounds. Extremities: no cyanosis, clubbing, rash, edema Neuro: alert & orientedx3, cranial nerves grossly intact. moves all 4 extremities w/o difficulty. Affect pleasant   DATA REVIEW  ECG: 08/12/22: sinus tachycardia as per my personal interpretation  ECHO: 2/25: EF 20-25%, severe LV dilation, normal RV, normal IVC.  11/04/22: LVEF 20-25% with global hypokinesis  as per my personal interpretation 10/10/18 ([facility]): LVEF 20-25%, RV with mildly reduced function.   Based on [facility] records he had a stress MRI in 2011 with an LVEF of 35%.   CATH: 09/19/20:  Severely depressed overall left ventricular function EF less than 25% Severely enlarged left ventricular chamber Left main large free of disease LAD was large and free of disease Circumflex was large and free of disease left dominant Right heart cath showed no evidence of pulmonary hypertension Mean PA was 23 mean wedge of 10 Cardiac output of 7.6 Fick  RHC 03/16/23:  HEMODYNAMICS: RA:                  9 mmHg (mean) PA:                  64/26 mmHg (42 mean) PCWP:            25 with large V waves                                      Estimated Fick CO/CI   5 L/min, 1.9 L/min/m2                                                 TPG                 17  mmHg                                            PVR                 3.4 Debarah  Units  PAPi                4       IMPRESSION: Moderate to severely elevated pre and post capillary filling pressures.   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  Severely reduced cardiac output / index.   07/22/23:    FDG uptake findings are inconsistent with active myocardial inflammation/sarcoidosis.   FDG uptake was not observed. LV perfusion is abnormal. There is no evidence of ischemia. Defect 1: There is a medium defect with moderate reduction in uptake present in the apical to mid inferior location(s). FDG uptake was not present in the described defect segment(s). There is abnormal wall motion in the defect area. Consistent with artifact caused by subdiaphragmatic activity; Based on this study, cannot exclude small inferior infarct (less likely given history)   Left ventricular function is abnormal. Global function is severely reduced. EF: 22%. End diastolic cavity size is severely enlarged. End systolic cavity size is severely enlarged.  CPX: 8/25    - pRER 1.06. PVO2 9.9 (46%) corrected for ibw 19.7. Slope 29   ASSESSMENT & PLAN:  1.  Chronic systolic CHF: Nonischemic cardiomyopathy, Medtronic ICD.  Cath in 2/22 with no significant CAD.   FDG PET in 12/24 not suggestive of active inflammation/active cardiac sarcoidosis.  Last echo in 2/25 with EF 20-25%, severe LV dilation, normal RV, normal IVC. He is on amiodarone  to suppress PVCs which may contribute to cardiomyopathy.  BMI remains quite high at 51, not candidate for advanced therapies at this point, would need ongoing weight loss.   - Remains NYHA II-III - Volume ok on exam and ICD - Continue digoxin  0.125 - Continue Farxiga  10 mg daily.  - Continue Entresto  49/51 bid - Continue spironolactone  25 mg daily.   - Continue torsemide  40 qam with occasional 20 qpm.  Takes extra as needed  - Continue Toprol  75 mg at bedtime.  - Recent bllodwork 08/31/24 Scr 1.39 K 4.4 I reviewed  - CPX: 8/25 pRER 1.06. PVO2 9.9 (46%) corrected for ibw 19.7. Slope 29. 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  - Continue amio 200 daily 3. Morbid obesity: Body mass index is 50.55 kg/m.  - Has lost 30 pounds on Mounjaro . Continue Mounjaro . Would recommend increase to 15 4.  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  - Using anoro daily; reports that it is helping.  5. OSA:  - continue CPAP 6. CKD 3a - continue Entresto  and Farxiga   Valentina Alcoser 09/04/2024  "

## 2024-09-04 NOTE — Addendum Note (Signed)
 Addended by: BUELL POWELL HERO on: 09/04/2024 04:06 PM   Modules accepted: Orders

## 2024-09-14 ENCOUNTER — Telehealth (HOSPITAL_COMMUNITY): Payer: Self-pay | Admitting: Licensed Clinical Social Worker

## 2024-09-14 DIAGNOSIS — I428 Other cardiomyopathies: Secondary | ICD-10-CM

## 2024-09-14 LAB — CUP PACEART REMOTE DEVICE CHECK
Battery Remaining Longevity: 147 mo
Battery Voltage: 3.02 V
Brady Statistic RA Percent Paced: INVALID
Brady Statistic RV Percent Paced: 0.01 %
Date Time Interrogation Session: 20260129051706
HighPow Impedance: 88 Ohm
Implantable Lead Connection Status: 753985
Implantable Lead Implant Date: 20240501
Implantable Lead Location: 753860
Implantable Pulse Generator Implant Date: 20240501
Lead Channel Impedance Value: 266 Ohm
Lead Channel Impedance Value: 342 Ohm
Lead Channel Pacing Threshold Amplitude: 1.375 V
Lead Channel Pacing Threshold Pulse Width: 0.4 ms
Lead Channel Sensing Intrinsic Amplitude: 4 mV
Lead Channel Setting Pacing Amplitude: 2 V
Lead Channel Setting Pacing Pulse Width: 0.4 ms
Lead Channel Setting Sensing Sensitivity: 0.3 mV
Zone Setting Status: 755011

## 2024-09-14 NOTE — Telephone Encounter (Signed)
 H&V Care Navigation CSW Progress Note  Clinical Social Worker called pt to discuss concerns with copay for upcoming procedure.  Patient is unsure how much procedure being recommended would cost so is worried if it would be affordable.  Explained that staff would need to reach out to insurance for prior auth to get idea of patient responsibility for procedure and they he could discuss payment plan with billing department.  He will do this and if it is still unaffordable informed to reach out to us  so we could see if there were any other options for assistance.  Has applied to Medicaid in the past and was denied.  Patient makes $1,500/month after insurance deductions but is married and unaware of wifes income.  Recommended he discuss with her to see if he might be eligible for other assistance options like Extra Help.  Marc HILARIO Leech, LCSW Clinical Social Worker [facility] Desk#: [phone] Cell#: [phone]

## 2024-09-17 ENCOUNTER — Ambulatory Visit: Payer: Self-pay | Admitting: Cardiology

## 2024-09-19 NOTE — Progress Notes (Signed)
 Remote ICD Transmission

## 2024-09-28 ENCOUNTER — Ambulatory Visit: Payer: Self-pay

## 2025-01-01 ENCOUNTER — Ambulatory Visit: Admitting: Internal Medicine
